# Patient Record
Sex: Male | Born: 1945
Health system: Southern US, Community
[De-identification: ages and names within clinical notes are randomized; demographics above are authoritative.]

## PROBLEM LIST (undated history)

## (undated) DIAGNOSIS — G4730 Sleep apnea, unspecified: Secondary | ICD-10-CM

## (undated) DIAGNOSIS — I10 Essential (primary) hypertension: Secondary | ICD-10-CM

## (undated) DIAGNOSIS — G5600 Carpal tunnel syndrome, unspecified upper limb: Secondary | ICD-10-CM

## (undated) DIAGNOSIS — J309 Allergic rhinitis, unspecified: Secondary | ICD-10-CM

## (undated) DIAGNOSIS — F1010 Alcohol abuse, uncomplicated: Secondary | ICD-10-CM

## (undated) DIAGNOSIS — G4700 Insomnia, unspecified: Secondary | ICD-10-CM

## (undated) DIAGNOSIS — J449 Chronic obstructive pulmonary disease, unspecified: Secondary | ICD-10-CM

## (undated) DIAGNOSIS — K219 Gastro-esophageal reflux disease without esophagitis: Secondary | ICD-10-CM

## (undated) DIAGNOSIS — F528 Other sexual dysfunction not due to a substance or known physiological condition: Secondary | ICD-10-CM

## (undated) DIAGNOSIS — R7302 Impaired glucose tolerance (oral): Secondary | ICD-10-CM

## (undated) DIAGNOSIS — F329 Major depressive disorder, single episode, unspecified: Secondary | ICD-10-CM

## (undated) DIAGNOSIS — F411 Generalized anxiety disorder: Secondary | ICD-10-CM

## (undated) DIAGNOSIS — E739 Lactose intolerance, unspecified: Secondary | ICD-10-CM

## (undated) DIAGNOSIS — E785 Hyperlipidemia, unspecified: Secondary | ICD-10-CM

## (undated) DIAGNOSIS — Z87898 Personal history of other specified conditions: Secondary | ICD-10-CM

## (undated) DIAGNOSIS — M79609 Pain in unspecified limb: Secondary | ICD-10-CM

## (undated) HISTORY — DX: Allergic rhinitis, unspecified: J30.9

## (undated) HISTORY — DX: Insomnia, unspecified: G47.00

## (undated) HISTORY — DX: Chronic obstructive pulmonary disease, unspecified: J44.9

## (undated) HISTORY — DX: Gastro-esophageal reflux disease without esophagitis: K21.9

## (undated) HISTORY — DX: Alcohol abuse, uncomplicated: F10.10

## (undated) HISTORY — DX: Other sexual dysfunction not due to a substance or known physiological condition: F52.8

## (undated) HISTORY — DX: Personal history of other specified conditions: Z87.898

## (undated) HISTORY — DX: Carpal tunnel syndrome, unspecified upper limb: G56.00

## (undated) HISTORY — PX: COLONOSCOPY: SHX174

## (undated) HISTORY — DX: Hyperlipidemia, unspecified: E78.5

## (undated) HISTORY — PX: ACHILLES TENDON REPAIR: SUR1153

## (undated) HISTORY — DX: Generalized anxiety disorder: F41.1

## (undated) HISTORY — DX: Impaired glucose tolerance (oral): R73.02

## (undated) HISTORY — DX: Lactose intolerance, unspecified: E73.9

## (undated) HISTORY — DX: Major depressive disorder, single episode, unspecified: F32.9

## (undated) HISTORY — DX: Essential (primary) hypertension: I10

## (undated) HISTORY — DX: Pain in unspecified limb: M79.609

---

## 1998-09-23 ENCOUNTER — Inpatient Hospital Stay (HOSPITAL_COMMUNITY): Admission: EM | Admit: 1998-09-23 | Discharge: 1998-09-29 | Payer: Self-pay | Admitting: Emergency Medicine

## 1998-09-23 ENCOUNTER — Encounter: Payer: Self-pay | Admitting: Emergency Medicine

## 2002-06-05 ENCOUNTER — Emergency Department (HOSPITAL_COMMUNITY): Admission: EM | Admit: 2002-06-05 | Discharge: 2002-06-05 | Payer: Self-pay | Admitting: Emergency Medicine

## 2002-06-05 ENCOUNTER — Encounter: Payer: Self-pay | Admitting: Emergency Medicine

## 2002-07-04 ENCOUNTER — Ambulatory Visit (HOSPITAL_COMMUNITY): Admission: RE | Admit: 2002-07-04 | Discharge: 2002-07-04 | Payer: Self-pay | Admitting: Gastroenterology

## 2002-07-04 LAB — HM COLONOSCOPY

## 2004-01-08 ENCOUNTER — Emergency Department (HOSPITAL_COMMUNITY): Admission: EM | Admit: 2004-01-08 | Discharge: 2004-01-08 | Payer: Self-pay | Admitting: Family Medicine

## 2004-02-17 ENCOUNTER — Emergency Department (HOSPITAL_COMMUNITY): Admission: EM | Admit: 2004-02-17 | Discharge: 2004-02-17 | Payer: Self-pay | Admitting: Emergency Medicine

## 2004-11-03 ENCOUNTER — Ambulatory Visit: Payer: Self-pay | Admitting: Internal Medicine

## 2005-04-14 ENCOUNTER — Ambulatory Visit: Payer: Self-pay | Admitting: Internal Medicine

## 2005-04-27 ENCOUNTER — Ambulatory Visit: Payer: Self-pay | Admitting: Internal Medicine

## 2005-04-30 ENCOUNTER — Ambulatory Visit: Payer: Self-pay | Admitting: Internal Medicine

## 2005-09-17 ENCOUNTER — Ambulatory Visit: Payer: Self-pay | Admitting: Internal Medicine

## 2006-08-16 ENCOUNTER — Encounter: Payer: Self-pay | Admitting: Internal Medicine

## 2006-08-16 ENCOUNTER — Ambulatory Visit: Payer: Self-pay | Admitting: Endocrinology

## 2006-08-16 LAB — CONVERTED CEMR LAB: PSA: 0.98 ng/mL

## 2006-08-19 ENCOUNTER — Ambulatory Visit: Payer: Self-pay | Admitting: Internal Medicine

## 2006-09-13 ENCOUNTER — Ambulatory Visit: Payer: Self-pay | Admitting: Internal Medicine

## 2007-06-06 ENCOUNTER — Ambulatory Visit: Payer: Self-pay | Admitting: Internal Medicine

## 2007-06-06 LAB — CONVERTED CEMR LAB
ALT: 46 units/L (ref 0–53)
AST: 40 units/L — ABNORMAL HIGH (ref 0–37)
Albumin: 3.9 g/dL (ref 3.5–5.2)
Alkaline Phosphatase: 50 units/L (ref 39–117)
Bilirubin, Direct: 0.1 mg/dL (ref 0.0–0.3)
Cholesterol: 182 mg/dL (ref 0–200)
Direct LDL: 110.1 mg/dL
HDL: 26.3 mg/dL — ABNORMAL LOW (ref >39.0)
Total Bilirubin: 0.8 mg/dL (ref 0.3–1.2)
Total CHOL/HDL Ratio: 6.9
Total Protein: 7 g/dL (ref 6.0–8.3)
Triglycerides: 308 mg/dL (ref 0–149)
VLDL: 62 mg/dL — ABNORMAL HIGH (ref 0–40)

## 2007-06-08 ENCOUNTER — Ambulatory Visit: Payer: Self-pay | Admitting: Internal Medicine

## 2007-06-22 ENCOUNTER — Encounter: Payer: Self-pay | Admitting: Internal Medicine

## 2007-08-02 ENCOUNTER — Ambulatory Visit: Payer: Self-pay | Admitting: Internal Medicine

## 2007-08-02 LAB — CONVERTED CEMR LAB
ALT: 56 units/L — ABNORMAL HIGH (ref 0–53)
AST: 44 units/L — ABNORMAL HIGH (ref 0–37)
Albumin: 4.1 g/dL (ref 3.5–5.2)
Alkaline Phosphatase: 58 units/L (ref 39–117)
BUN: 9 mg/dL (ref 6–23)
Bacteria, UA: NEGATIVE
Basophils Absolute: 0 10*3/uL (ref 0.0–0.1)
Basophils Relative: 0.5 % (ref 0.0–1.0)
Bilirubin Urine: NEGATIVE
Bilirubin, Direct: 0.1 mg/dL (ref 0.0–0.3)
CO2: 28 meq/L (ref 19–32)
Calcium: 9 mg/dL (ref 8.4–10.5)
Chloride: 104 meq/L (ref 96–112)
Cholesterol: 139 mg/dL (ref 0–200)
Creatinine, Ser: 1 mg/dL (ref 0.4–1.5)
Crystals: NEGATIVE
Direct LDL: 67 mg/dL
Eosinophils Absolute: 0.3 10*3/uL (ref 0.0–0.6)
Eosinophils Relative: 3.7 % (ref 0.0–5.0)
GFR calc Af Amer: 98 mL/min
GFR calc non Af Amer: 81 mL/min
Glucose, Bld: 122 mg/dL — ABNORMAL HIGH (ref 70–99)
HCT: 39.2 % (ref 39.0–52.0)
HDL: 32.5 mg/dL — ABNORMAL LOW (ref >39.0)
Hemoglobin: 13.9 g/dL (ref 13.0–17.0)
Ketones, ur: NEGATIVE mg/dL
Leukocytes, UA: NEGATIVE
Lymphocytes Relative: 31.3 % (ref 12.0–46.0)
MCHC: 35.4 g/dL (ref 30.0–36.0)
MCV: 95.8 fL (ref 78.0–100.0)
Monocytes Absolute: 1 10*3/uL — ABNORMAL HIGH (ref 0.2–0.7)
Monocytes Relative: 12.4 % — ABNORMAL HIGH (ref 3.0–11.0)
Mucus, UA: NEGATIVE
Neutro Abs: 4.1 10*3/uL (ref 1.4–7.7)
Neutrophils Relative %: 52.1 % (ref 43.0–77.0)
Nitrite: NEGATIVE
PSA: 0.78 ng/mL
PSA: 0.78 ng/mL (ref 0.10–4.00)
Platelets: 379 10*3/uL (ref 150–400)
Potassium: 4.2 meq/L (ref 3.5–5.1)
RBC / HPF: NONE SEEN
RBC: 4.1 M/uL — ABNORMAL LOW (ref 4.22–5.81)
RDW: 11.6 % (ref 11.5–14.6)
Sodium: 139 meq/L (ref 135–145)
Specific Gravity, Urine: 1.02 (ref 1.000–1.03)
TSH: 0.98 microintl units/mL (ref 0.35–5.50)
Total Bilirubin: 0.7 mg/dL (ref 0.3–1.2)
Total CHOL/HDL Ratio: 4.3
Total Protein, Urine: 30 mg/dL — AB
Total Protein: 7.2 g/dL (ref 6.0–8.3)
Triglycerides: 209 mg/dL (ref 0–149)
Urine Glucose: NEGATIVE mg/dL
Urobilinogen, UA: 0.2 (ref 0.0–1.0)
VLDL: 42 mg/dL — ABNORMAL HIGH (ref 0–40)
WBC, UA: NONE SEEN cells/hpf
WBC: 7.9 10*3/uL (ref 4.5–10.5)
pH: 6.5 (ref 5.0–8.0)

## 2007-08-04 ENCOUNTER — Ambulatory Visit: Payer: Self-pay | Admitting: Internal Medicine

## 2007-08-04 LAB — CONVERTED CEMR LAB: Hgb A1c MFr Bld: 5.8 % (ref 4.6–6.0)

## 2007-08-08 ENCOUNTER — Encounter: Payer: Self-pay | Admitting: Internal Medicine

## 2007-08-08 DIAGNOSIS — I10 Essential (primary) hypertension: Secondary | ICD-10-CM | POA: Insufficient documentation

## 2007-08-08 DIAGNOSIS — J309 Allergic rhinitis, unspecified: Secondary | ICD-10-CM

## 2007-08-08 DIAGNOSIS — F528 Other sexual dysfunction not due to a substance or known physiological condition: Secondary | ICD-10-CM

## 2007-08-08 DIAGNOSIS — Z87898 Personal history of other specified conditions: Secondary | ICD-10-CM | POA: Insufficient documentation

## 2007-08-08 DIAGNOSIS — F1010 Alcohol abuse, uncomplicated: Secondary | ICD-10-CM | POA: Insufficient documentation

## 2007-08-08 DIAGNOSIS — K219 Gastro-esophageal reflux disease without esophagitis: Secondary | ICD-10-CM | POA: Insufficient documentation

## 2007-08-08 DIAGNOSIS — F3289 Other specified depressive episodes: Secondary | ICD-10-CM

## 2007-08-08 DIAGNOSIS — F329 Major depressive disorder, single episode, unspecified: Secondary | ICD-10-CM

## 2007-08-08 DIAGNOSIS — E739 Lactose intolerance, unspecified: Secondary | ICD-10-CM

## 2007-08-08 DIAGNOSIS — E785 Hyperlipidemia, unspecified: Secondary | ICD-10-CM | POA: Insufficient documentation

## 2007-08-08 DIAGNOSIS — F411 Generalized anxiety disorder: Secondary | ICD-10-CM

## 2007-08-08 DIAGNOSIS — N529 Male erectile dysfunction, unspecified: Secondary | ICD-10-CM | POA: Insufficient documentation

## 2007-08-08 DIAGNOSIS — F32A Depression, unspecified: Secondary | ICD-10-CM | POA: Insufficient documentation

## 2007-08-08 HISTORY — DX: Essential (primary) hypertension: I10

## 2007-08-08 HISTORY — DX: Other sexual dysfunction not due to a substance or known physiological condition: F52.8

## 2007-08-08 HISTORY — DX: Hyperlipidemia, unspecified: E78.5

## 2007-08-08 HISTORY — DX: Allergic rhinitis, unspecified: J30.9

## 2007-08-08 HISTORY — DX: Gastro-esophageal reflux disease without esophagitis: K21.9

## 2007-08-08 HISTORY — DX: Personal history of other specified conditions: Z87.898

## 2007-08-08 HISTORY — DX: Lactose intolerance, unspecified: E73.9

## 2007-08-08 HISTORY — DX: Generalized anxiety disorder: F41.1

## 2007-08-08 HISTORY — DX: Other specified depressive episodes: F32.89

## 2007-08-08 HISTORY — DX: Alcohol abuse, uncomplicated: F10.10

## 2007-08-08 HISTORY — DX: Major depressive disorder, single episode, unspecified: F32.9

## 2007-12-12 ENCOUNTER — Emergency Department (HOSPITAL_COMMUNITY): Admission: EM | Admit: 2007-12-12 | Discharge: 2007-12-12 | Payer: Self-pay | Admitting: Emergency Medicine

## 2008-08-06 ENCOUNTER — Encounter: Payer: Self-pay | Admitting: Internal Medicine

## 2008-09-21 ENCOUNTER — Telehealth (INDEPENDENT_AMBULATORY_CARE_PROVIDER_SITE_OTHER): Payer: Self-pay | Admitting: *Deleted

## 2008-09-25 ENCOUNTER — Telehealth (INDEPENDENT_AMBULATORY_CARE_PROVIDER_SITE_OTHER): Payer: Self-pay | Admitting: *Deleted

## 2009-10-02 ENCOUNTER — Ambulatory Visit: Payer: Self-pay | Admitting: Internal Medicine

## 2009-10-02 DIAGNOSIS — M79609 Pain in unspecified limb: Secondary | ICD-10-CM | POA: Insufficient documentation

## 2009-10-02 HISTORY — DX: Pain in unspecified limb: M79.609

## 2009-10-03 DIAGNOSIS — J449 Chronic obstructive pulmonary disease, unspecified: Secondary | ICD-10-CM

## 2009-10-03 DIAGNOSIS — J4489 Other specified chronic obstructive pulmonary disease: Secondary | ICD-10-CM

## 2009-10-03 HISTORY — DX: Chronic obstructive pulmonary disease, unspecified: J44.9

## 2009-10-03 HISTORY — DX: Other specified chronic obstructive pulmonary disease: J44.89

## 2009-10-04 ENCOUNTER — Telehealth: Payer: Self-pay | Admitting: Internal Medicine

## 2009-10-04 LAB — CONVERTED CEMR LAB
ALT: 41 units/L (ref 0–53)
AST: 32 units/L (ref 0–37)
Albumin: 4.5 g/dL (ref 3.5–5.2)
Alkaline Phosphatase: 76 units/L (ref 39–117)
BUN: 22 mg/dL (ref 6–23)
Basophils Absolute: 0 10*3/uL (ref 0.0–0.1)
Basophils Relative: 0 % (ref 0.0–3.0)
Bilirubin Urine: NEGATIVE
Bilirubin, Direct: 0.1 mg/dL (ref 0.0–0.3)
CO2: 28 meq/L (ref 19–32)
Calcium: 9.6 mg/dL (ref 8.4–10.5)
Chloride: 102 meq/L (ref 96–112)
Cholesterol: 183 mg/dL (ref 0–200)
Creatinine, Ser: 1.2 mg/dL (ref 0.4–1.5)
Direct LDL: 86.7 mg/dL
Eosinophils Absolute: 0.5 10*3/uL (ref 0.0–0.7)
Eosinophils Relative: 4.9 % (ref 0.0–5.0)
Folate: 11.3 ng/mL
GFR calc non Af Amer: 78.48 mL/min (ref >60)
Glucose, Bld: 102 mg/dL — ABNORMAL HIGH (ref 70–99)
HCT: 42.6 % (ref 39.0–52.0)
HDL: 28.7 mg/dL — ABNORMAL LOW (ref >39.00)
Hemoglobin, Urine: NEGATIVE
Hemoglobin: 14.7 g/dL (ref 13.0–17.0)
Hgb A1c MFr Bld: 5.9 % (ref 4.6–6.5)
Ketones, ur: NEGATIVE mg/dL
Leukocytes, UA: NEGATIVE
Lymphocytes Relative: 24.6 % (ref 12.0–46.0)
Lymphs Abs: 2.3 10*3/uL (ref 0.7–4.0)
MCHC: 34.5 g/dL (ref 30.0–36.0)
MCV: 97 fL (ref 78.0–100.0)
Monocytes Absolute: 1.1 10*3/uL — ABNORMAL HIGH (ref 0.1–1.0)
Monocytes Relative: 11.5 % (ref 3.0–12.0)
Neutro Abs: 5.4 10*3/uL (ref 1.4–7.7)
Neutrophils Relative %: 59 % (ref 43.0–77.0)
Nitrite: NEGATIVE
PSA: 1.58 ng/mL (ref 0.10–4.00)
Platelets: 261 10*3/uL (ref 150.0–400.0)
Potassium: 4.3 meq/L (ref 3.5–5.1)
RBC: 4.39 M/uL (ref 4.22–5.81)
RDW: 11.6 % (ref 11.5–14.6)
Sodium: 138 meq/L (ref 135–145)
Specific Gravity, Urine: 1.015 (ref 1.000–1.030)
TSH: 0.69 microintl units/mL (ref 0.35–5.50)
Total Bilirubin: 0.8 mg/dL (ref 0.3–1.2)
Total CHOL/HDL Ratio: 6
Total Protein, Urine: NEGATIVE mg/dL
Total Protein: 7.8 g/dL (ref 6.0–8.3)
Triglycerides: 638 mg/dL — ABNORMAL HIGH (ref 0.0–149.0)
Urine Glucose: NEGATIVE mg/dL
Urobilinogen, UA: 0.2 (ref 0.0–1.0)
VLDL: 127.6 mg/dL — ABNORMAL HIGH (ref 0.0–40.0)
Vitamin B-12: 568 pg/mL (ref 211–911)
WBC: 9.3 10*3/uL (ref 4.5–10.5)
pH: 6 (ref 5.0–8.0)

## 2010-12-16 NOTE — Progress Notes (Signed)
Summary: lab requset  Phone Note Call from Patient Call back at Home Phone [phone]   Caller: [phone] Call For: dr Jonny Ruiz Summary of Call: per [name] call  will see dr Jonny Ruiz on 09-26-08 want to know if dr Jonny Ruiz want him to have blood work prior to his appt on Nov 11. Ryan Diaz is requseting Hemoblobin A1c .,and  HDL cholesterol test  Initial call taken by: Shelbie Proctor,  September 21, 2008 10:59 AM  Follow-up for Phone Call        good idea - ok for CPX labs prior - v70.0 Follow-up by: Corwin Levins MD,  September 21, 2008 12:54 PM  Additional Follow-up for Phone Call Additional follow up Details #1::        called pt to  inform left msg on machine  Additional Follow-up by: Shelbie Proctor,  September 21, 2008 1:13 PM

## 2010-12-16 NOTE — Assessment & Plan Note (Signed)
Summary: ARM GETS NUMB/NWS   Vital Signs:  Patient profile:   65 year old male Height:      70 inches Weight:      173 pounds BMI:     24.91 O2 Sat:      97 % on Room air Temp:     98.4 degrees F oral Pulse rate:   84 / minute BP sitting:   122 / 80  (left arm) Cuff size:   regular  Vitals Entered ByZella Ball Ewing (October 02, 2009 11:25 AM)  O2 Flow:  Room air  Preventive Care Screening  Colonoscopy:    Next Due:  07/2007     declines tetanus today and colonoscopy   History of Present Illness: here overall to f/u, also seen at the [place] but not seen for almost one yr per pt;  is on meds he cannot recall for BP and chol but seems to be tolerating well;  Pt denies CP, sob, doe, wheezing, orthopnea, pnd, worsening LE edema, palps, dizziness or syncope, except for typical daily reflux wore with recumbancy despite antireflux precaustions.  No dysphagia, wt loss, n/v or other abd pain or blood.  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  No elbow pain or extremity tenderness , erythema or swelling o/w, but does have some mild to mod occasional post neck pain as well and hard to find a good position to sleep at night - to him does not seem radicular from the neck, however to the arms.  No change in bowel or bladder, falls, or LE pain, numb or weakness, fever, night sweats, wt loss or trauma.    Preventive Screening-Counseling & Management  Alcohol-Tobacco     Smoking Status: quit  Problems Prior to Update: 1)  COPD  (ICD-496) 2)  Pain in Soft Tissues of Limb  (ICD-729.5) 3)  Preventive Health Care  (ICD-V70.0) 4)  Allergic Rhinitis  (ICD-477.9) 5)  Depression  (ICD-311) 6)  Anxiety   (ICD-300.00) 7)  Genital Herpes, Hx of  (ICD-V13.8) 8)  Erectile Dysfunction  (ICD-302.72) 9)  Abuse, Alcohol, Continuous  (ICD-305.01) 10)  Hypertension  (ICD-401.9) 11)  Hyperlipidemia  (ICD-272.4) 12)  Gerd  (ICD-530.81) 13)  Glucose Intolerance  (ICD-271.3)  Medications Prior to Update: 1)  None  Current Medications (verified): 1)  Bp Pill Per [facility] 2)  Chol Pill Per [facility] 3)  Meloxicam 15 Mg Tabs (Meloxicam) .Marland Kitchen.. 1po Once Daily As Needed Pain 4)  Omeprazole 20 Mg Cpdr (Omeprazole) .... 2 By Mouth Once Daily 5)  Fenofibrate 160 Mg Tabs (Fenofibrate) .Marland Kitchen.. 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 brother with colon polyp father died with brain tumor at 51 mother died at 69 with heart disease brother died with pna brother with CAD/MI  Social History: Reviewed history and no changes required. Former Smoker Alcohol use-yes self employed - Airline pilot work Divorced 1 son Smoking Status:  quit  Review of Systems  The patient denies anorexia, fever, weight loss, weight gain, vision loss, decreased hearing, hoarseness, chest pain, syncope, dyspnea on exertion, peripheral edema, prolonged cough, headaches, hemoptysis, abdominal pain,  melena, hematochezia, severe indigestion/heartburn, hematuria, incontinence, muscle weakness, suspicious skin lesions, transient blindness, difficulty walking, depression, unusual weight change, abnormal bleeding, enlarged lymph nodes, and angioedema.         all otherwise negative per pt -  Physical Exam  General:  alert and well-developed.   Head:  normocephalic and atraumatic.   Eyes:  vision grossly intact, pupils equal, and pupils round.   Ears:  R ear normal and L ear normal.   Nose:  no external deformity and no nasal discharge.   Mouth:  no gingival abnormalities and pharynx pink and moist.   Neck:  supple and no masses.   Lungs:  normal respiratory effort and normal breath sounds.   Heart:  normal rate and regular rhythm.   Abdomen:  soft, non-tender, and normal bowel sounds.   Msk:  no joint tenderness and no joint swelling.  and no specific tenderness or erythema or swelling of the neck, upper back shoulders,  or arms.   Extremities:  no edema, no erythema  Neurologic:  cranial nerves II-XII intact, strength normal in all extremities, and sensation intact to light touch.   Skin:  no rashes and no edema.   Psych:  moderately anxious.     Impression & Recommendations:  Problem # 1:  Preventive Health Care (ICD-V70.0)  Overall doing well, up to date, counseled on routine health concerns for screening and prevention, immunizations up to date or declined, labs ordered  Orders: TLB-BMP (Basic Metabolic Panel-BMET) (80048-METABOL) TLB-CBC Platelet - w/Differential (85025-CBCD) TLB-Hepatic/Liver Function Pnl (80076-HEPATIC) TLB-Lipid Panel (80061-LIPID) TLB-TSH (Thyroid Stimulating Hormone) (84443-TSH) TLB-PSA (Prostate Specific Antigen) (84153-PSA) TLB-Udip ONLY (81003-UDIP)  Problem # 2:  PAIN IN SOFT TISSUES OF LIMB (ICD-729.5)  bilateral I suspect is CTS but cant r/o bilat radicular pain from the c-spine; for now will tx with meloxicam as needed, wrist splint at night,  and check EMG/NCS; consider MRI c-spine  Orders: Misc. Referral (Misc. Ref) TLB-B12 + Folate Pnl (82746_82607-B12/FOL)  Problem # 3:  GERD (ICD-530.81)  His updated medication list for this problem includes:    Omeprazole 20 Mg Cpdr (Omeprazole) .Marland Kitchen... 2 by mouth once daily treat as above, f/u any worsening signs or symptoms   Complete Medication List: 1)  Bp Pill Per Va  2)  Chol Pill Per Va  3)  Meloxicam 15 Mg Tabs (Meloxicam) .Marland Kitchen.. 1po once daily as needed pain 4)  Omeprazole 20 Mg Cpdr (Omeprazole) .... 2 by mouth once daily 5)  Fenofibrate 160 Mg Tabs (Fenofibrate) .Marland Kitchen.. 1 by mouth once daily  Other Orders: TLB-A1C / Hgb A1C (Glycohemoglobin) (83036-A1C)  Patient Instructions: 1)  Please take all new medications as prescribed 2)  Continue all previous medications as before this visit  3)  please wear the wrist splints at night only to see if this helps the paiin 4)  Please go to the Lab in the basement for your blood and/or  urine tests today 5)  Continue all previous medications as before this visit  6)  You will be contacted about the referral(s) to: Nerve test for the arms 7)  Please schedule a follow-up appointment in 1 year. or sooner if needed Prescriptions: OMEPRAZOLE 20 MG CPDR (OMEPRAZOLE) 2 by mouth once daily  #60 x 11   Entered and Authorized by:   Corwin Levins MD   Signed by:   Corwin Levins MD on 10/02/2009   Method used:   Print then Give to Patient   RxID:   [id number] MELOXICAM 15 MG TABS (MELOXICAM) 1po once daily as needed pain  #30 x 5   Entered and Authorized by:   Corwin Levins MD   Signed by:   Corwin Levins MD on 10/02/2009   Method used:   Print then Give to Patient   RxID:   [id number]    Immunization History:  Influenza Immunization History:    Influenza:  historical (09/16/2009)    Immunization History:  Influenza Immunization History:    Influenza:  Historical (09/16/2009)

## 2010-12-16 NOTE — Progress Notes (Signed)
Summary: appt cx  Phone Note Call from Patient Call back at Home Phone [phone]   Summary of Call: per Ryan Diaz called want to cx his appt with Dr Ryan Diaz on 09-26-08 due to veteran day per pt transfer him to scheduling desk to cx  Initial call taken by: Shelbie Proctor,  September 25, 2008 12:22 PM

## 2010-12-16 NOTE — Consult Note (Signed)
Summary: [facility]   Imported By: Esmeralda Links D'jimraou 08/10/2008 15:32:34  _____________________________________________________________________  External Attachment:    Type:   Image     Comment:   External Document

## 2010-12-16 NOTE — Progress Notes (Signed)
----   Converted from flag ---- ---- 10/04/2009 8:19 AM, Corwin Levins MD wrote: yes  ---- 10/04/2009 8:00 AM, Zella Ball Ewing wrote: spoke to Ryan Diaz this am. Meds are as follows, Lisinopril 12.5 mg once daily, Pravastatin 80mg  1/2qhs, omeprazole 20mg  once daily. He also wants to know if he continues on these meds along with the fenofibrate also.   ---- 10/02/2009 5:22 PM, Corwin Levins MD wrote: please call pt to get accurate current meds (there were 2 meds he did not know today) ------------------------------  Appended Document:  please call pt  again, b/c lisinopril does not come in 12.5 -   is he taking lisinopril HCT ?  and what strength?  Appended Document:  Ryan Diaz's rx is Lisinopril HCTZ 10/12.5 mg

## 2011-09-26 ENCOUNTER — Encounter: Payer: Self-pay | Admitting: Internal Medicine

## 2011-09-26 DIAGNOSIS — IMO0001 Reserved for inherently not codable concepts without codable children: Secondary | ICD-10-CM | POA: Insufficient documentation

## 2011-09-26 DIAGNOSIS — Z0000 Encounter for general adult medical examination without abnormal findings: Secondary | ICD-10-CM | POA: Insufficient documentation

## 2011-09-26 DIAGNOSIS — Z0001 Encounter for general adult medical examination with abnormal findings: Secondary | ICD-10-CM | POA: Insufficient documentation

## 2011-09-26 DIAGNOSIS — R7302 Impaired glucose tolerance (oral): Secondary | ICD-10-CM

## 2011-09-26 HISTORY — DX: Impaired glucose tolerance (oral): R73.02

## 2011-09-29 ENCOUNTER — Encounter: Payer: Self-pay | Admitting: Internal Medicine

## 2011-11-18 ENCOUNTER — Ambulatory Visit (INDEPENDENT_AMBULATORY_CARE_PROVIDER_SITE_OTHER): Payer: Medicare Other | Admitting: Internal Medicine

## 2011-11-18 ENCOUNTER — Other Ambulatory Visit (INDEPENDENT_AMBULATORY_CARE_PROVIDER_SITE_OTHER): Payer: Medicare Other

## 2011-11-18 ENCOUNTER — Encounter: Payer: Self-pay | Admitting: Internal Medicine

## 2011-11-18 VITALS — BP 130/80 | HR 80 | Temp 98.6°F | Ht 71.0 in | Wt 172.2 lb

## 2011-11-18 DIAGNOSIS — E785 Hyperlipidemia, unspecified: Secondary | ICD-10-CM

## 2011-11-18 DIAGNOSIS — F528 Other sexual dysfunction not due to a substance or known physiological condition: Secondary | ICD-10-CM

## 2011-11-18 DIAGNOSIS — R5383 Other fatigue: Secondary | ICD-10-CM

## 2011-11-18 DIAGNOSIS — Z0000 Encounter for general adult medical examination without abnormal findings: Secondary | ICD-10-CM

## 2011-11-18 DIAGNOSIS — R7302 Impaired glucose tolerance (oral): Secondary | ICD-10-CM

## 2011-11-18 DIAGNOSIS — R5381 Other malaise: Secondary | ICD-10-CM

## 2011-11-18 DIAGNOSIS — R351 Nocturia: Secondary | ICD-10-CM

## 2011-11-18 DIAGNOSIS — R7309 Other abnormal glucose: Secondary | ICD-10-CM

## 2011-11-18 DIAGNOSIS — I10 Essential (primary) hypertension: Secondary | ICD-10-CM | POA: Insufficient documentation

## 2011-11-18 LAB — URINALYSIS, ROUTINE W REFLEX MICROSCOPIC
Bilirubin Urine: NEGATIVE
Hgb urine dipstick: NEGATIVE
Ketones, ur: NEGATIVE
Leukocytes, UA: NEGATIVE
Nitrite: NEGATIVE
Specific Gravity, Urine: 1.015 (ref 1.000–1.030)
Total Protein, Urine: NEGATIVE
Urine Glucose: NEGATIVE
Urobilinogen, UA: 0.2 (ref 0.0–1.0)
pH: 7 (ref 5.0–8.0)

## 2011-11-18 LAB — CBC WITH DIFFERENTIAL/PLATELET
Basophils Absolute: 0 10*3/uL (ref 0.0–0.1)
Basophils Relative: 0.6 % (ref 0.0–3.0)
Eosinophils Absolute: 0.3 10*3/uL (ref 0.0–0.7)
Eosinophils Relative: 4.6 % (ref 0.0–5.0)
HCT: 40.2 % (ref 39.0–52.0)
Hemoglobin: 13.7 g/dL (ref 13.0–17.0)
Lymphocytes Relative: 28.9 % (ref 12.0–46.0)
Lymphs Abs: 1.9 10*3/uL (ref 0.7–4.0)
MCHC: 34.2 g/dL (ref 30.0–36.0)
MCV: 96.2 fl (ref 78.0–100.0)
Monocytes Absolute: 0.8 10*3/uL (ref 0.1–1.0)
Monocytes Relative: 12.2 % — ABNORMAL HIGH (ref 3.0–12.0)
Neutro Abs: 3.5 10*3/uL (ref 1.4–7.7)
Neutrophils Relative %: 53.7 % (ref 43.0–77.0)
Platelets: 350 10*3/uL (ref 150.0–400.0)
RBC: 4.18 Mil/uL — ABNORMAL LOW (ref 4.22–5.81)
RDW: 13.1 % (ref 11.5–14.6)
WBC: 6.4 10*3/uL (ref 4.5–10.5)

## 2011-11-18 LAB — BASIC METABOLIC PANEL
BUN: 25 mg/dL — ABNORMAL HIGH (ref 6–23)
CO2: 29 mEq/L (ref 19–32)
Calcium: 9.2 mg/dL (ref 8.4–10.5)
Chloride: 106 mEq/L (ref 96–112)
Creatinine, Ser: 1.1 mg/dL (ref 0.4–1.5)
GFR: 83.56 mL/min (ref >60.00)
Glucose, Bld: 102 mg/dL — ABNORMAL HIGH (ref 70–99)
Potassium: 4.2 mEq/L (ref 3.5–5.1)
Sodium: 140 mEq/L (ref 135–145)

## 2011-11-18 LAB — HEPATIC FUNCTION PANEL
ALT: 20 U/L (ref 0–53)
AST: 19 U/L (ref 0–37)
Albumin: 4.1 g/dL (ref 3.5–5.2)
Alkaline Phosphatase: 54 U/L (ref 39–117)
Bilirubin, Direct: 0.1 mg/dL (ref 0.0–0.3)
Total Bilirubin: 0.6 mg/dL (ref 0.3–1.2)
Total Protein: 7.2 g/dL (ref 6.0–8.3)

## 2011-11-18 LAB — LIPID PANEL
Cholesterol: 162 mg/dL (ref 0–200)
HDL: 35.7 mg/dL — ABNORMAL LOW (ref >39.00)
LDL Cholesterol: 100 mg/dL — ABNORMAL HIGH (ref 0–99)
Total CHOL/HDL Ratio: 5
Triglycerides: 131 mg/dL (ref 0.0–149.0)
VLDL: 26.2 mg/dL (ref 0.0–40.0)

## 2011-11-18 LAB — TSH: TSH: 0.57 u[IU]/mL (ref 0.35–5.50)

## 2011-11-18 LAB — HEMOGLOBIN A1C: Hgb A1c MFr Bld: 5.7 % (ref 4.6–6.5)

## 2011-11-18 LAB — PSA: PSA: 1.19 ng/mL (ref 0.10–4.00)

## 2011-11-18 MED ORDER — OMEPRAZOLE 20 MG PO CPDR: 20.0000 mg | DELAYED_RELEASE_CAPSULE | Freq: Two times a day (BID) | ORAL | Status: DC

## 2011-11-18 MED ORDER — ASPIRIN EC 81 MG PO TBEC: 81.0000 mg | DELAYED_RELEASE_TABLET | Freq: Every day | ORAL | Status: AC

## 2011-11-18 MED ORDER — PRAVASTATIN SODIUM 80 MG PO TABS: 80.0000 mg | ORAL_TABLET | Freq: Every day | ORAL | Status: DC

## 2011-11-18 MED ORDER — LISINOPRIL-HYDROCHLOROTHIAZIDE 10-12.5 MG PO TABS: 1.0000 | ORAL_TABLET | Freq: Every day | ORAL | Status: DC

## 2011-11-18 NOTE — Patient Instructions (Addendum)
OK to stop the terasozin 2 mg Continue all other medications as before Please go to LAB in the Basement for the blood and/or urine tests to be done today Please call the phone number [phone] (the PhoneTree System) for results of testing in 2-3 days;  When calling, simply dial the number, and when prompted enter the MRN number above (the Medical Record Number) and the # key, then the message should start. You will be contacted regarding the referral for: colonoscopy Please start Aspirin 81 mg - 1 per day - COATED only to reduce risk of stroke and heart disease in the future Please return in 6 months, or sooner if needed

## 2011-11-18 NOTE — Assessment & Plan Note (Signed)
ECG reviewed as per emr, o/w stable overall by hx and exam, most recent data reviewed with pt, and pt to continue medical treatment as before

## 2011-11-18 NOTE — Assessment & Plan Note (Signed)
stable overall by hx and exam, most recent data reviewed with pt, and pt to continue medical treatment as before Lab Results  Component Value Date   HGBA1C 5.9 10/02/2009   For repaet a1c today

## 2011-11-18 NOTE — Assessment & Plan Note (Signed)
Not charged, but due for colonoscpy - will help arrange

## 2011-11-18 NOTE — Progress Notes (Signed)
Subjective:    Patient ID: Ryan Diaz, male    DOB: Mar 04, 1946, 66 y.o.   MRN: [id number]  HPI  Here for f/u;  Overall doing ok;  Pt denies CP, worsening SOB, DOE, wheezing, orthopnea, PND, worsening LE edema, palpitations, dizziness or syncope.  Pt denies neurological change such as new Headache, facial or extremity weakness.  Pt denies polydipsia, polyuria, or low sugar symptoms. Pt states overall good compliance with treatment and medications, good tolerability, and trying to follow lower cholesterol diet.  Pt denies worsening depressive symptoms, suicidal ideation or panic. No fever, wt loss, night sweats, loss of appetite, or other constitutional symptoms.  Pt states good ability with ADL's, low fall risk, home safety reviewed and adequate, no significant changes in hearing or vision, and occasionally active with exercise.  Does have some stiffness of the lower back in the AM.  Has some DOE with taking the trash cans to the curb but o/w doing ok. Was prescribed terasozin 2 mg qhs but has made no difference in  Nocturia 3-4 times at night, except that less ETOH has resulted in less nocturia., Does have sense of ongoing fatigue, but denies signficant hypersomnolence, may have more to do with getting less sleep at night with freq urination. Past Medical History  Diagnosis Date  . 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 ALLERGIC RHINITIS 08/08/2007  . ANXIETY 08/08/2007  . COPD 10/03/2009  . DEPRESSION 08/08/2007  . ERECTILE DYSFUNCTION 08/08/2007  . GENITAL HERPES, HX OF 08/08/2007  . GERD 08/08/2007  . GLUCOSE INTOLERANCE 08/08/2007  . HYPERLIPIDEMIA 08/08/2007  . HYPERTENSION 08/08/2007  . PAIN IN SOFT TISSUES OF LIMB 10/02/2009  . Impaired glucose tolerance 09/26/2011   No past surgical history on file.  reports that he has quit smoking. He does not have any smokeless tobacco history on file. He reports that he drinks alcohol. His drug history not on file. family history includes Colon polyps  in his brother. No Known Allergies No current outpatient prescriptions on file prior to visit.   Review of Systems Review of Systems  Constitutional: Negative for diaphoresis, activity change, appetite change and unexpected weight change.  HENT: Negative for hearing loss, ear pain, facial swelling, mouth sores and neck stiffness.   Eyes: Negative for pain, redness and visual disturbance.  Respiratory: Negative for shortness of breath and wheezing.   Cardiovascular: Negative for chest pain and palpitations.  Gastrointestinal: Negative for diarrhea, blood in stool, abdominal distention and rectal pain.  Genitourinary: Negative for hematuria, flank pain and decreased urine volume.  Musculoskeletal: Negative for myalgias and joint swelling.  Skin: Negative for color change and wound.  Neurological: Negative for syncope and numbness.  Hematological: Negative for adenopathy.  Psychiatric/Behavioral: Negative for hallucinations, self-injury, decreased concentration and agitation.      Objective:   Physical Exam BP 130/80  Pulse 80  Temp(Src) 98.6 F (37 C) (Oral)  Ht 5\' 11"  (1.803 m)  Wt 172 lb 4 oz (78.132 kg)  BMI 24.02 kg/m2  SpO2 95% Physical Exam  VS noted Constitutional: Pt is oriented to person, place, and time. Appears well-developed and well-nourished.  HENT:  Head: Normocephalic and atraumatic.  Right Ear: External ear normal.  Left Ear: External ear normal.  Nose: Nose normal.  Mouth/Throat: Oropharynx is clear and moist.  Eyes: Conjunctivae and EOM are normal. Pupils are equal, round, and reactive to light.  Neck: Normal range of motion. Neck supple. No JVD present. No tracheal deviation present.  Cardiovascular: Normal rate, regular  rhythm, normal heart sounds and intact distal pulses.   Pulmonary/Chest: Effort normal and breath sounds normal.  Abdominal: Soft. Bowel sounds are normal. There is no tenderness.  Musculoskeletal: Normal range of motion. Exhibits no  edema.  Lymphadenopathy:  Has no cervical adenopathy.  Neurological: Pt is alert and oriented to person, place, and time. Pt has normal reflexes. No cranial nerve deficit.  Skin: Skin is warm and dry. No rash noted.  Psychiatric:  Has  normal mood and affect. Behavior is normal.     Assessment & Plan:

## 2011-11-18 NOTE — Assessment & Plan Note (Signed)
Mild, improved with less ETOH use, for UA and PSA as he is due

## 2011-11-18 NOTE — Assessment & Plan Note (Signed)
Ongoing with low libido as well, declines viagra or similar, but asks for testosterone check - will order

## 2011-11-18 NOTE — Assessment & Plan Note (Signed)
Improved, none for the last 2 mo, except for 5 beers on NYE;  to f/u any worsening symptoms or concerns

## 2011-11-19 LAB — TESTOSTERONE, FREE, TOTAL, SHBG
Sex Hormone Binding: 42 nmol/L (ref 13–71)
Testosterone, Free: 66.6 pg/mL (ref 47.0–244.0)
Testosterone-% Free: 1.7 % (ref 1.6–2.9)
Testosterone: 383.65 ng/dL (ref 250–890)

## 2011-11-22 ENCOUNTER — Encounter: Payer: Self-pay | Admitting: Internal Medicine

## 2011-11-22 NOTE — Assessment & Plan Note (Signed)
Etiology unclear, Exam otherwise benign, to check labs as documented, follow with expectant management  

## 2011-11-22 NOTE — Assessment & Plan Note (Signed)
stable overall by hx and exam, most recent data reviewed with pt, and pt to continue medical treatment as before Lab Results  Component Value Date   LDLCALC 100* 11/18/2011

## 2011-11-22 NOTE — Assessment & Plan Note (Signed)
stable overall by hx and exam, most recent data reviewed with pt, and pt to continue medical treatment as before  BP Readings from Last 3 Encounters:  11/18/11 130/80  10/02/09 122/80  07/25/07 159/97

## 2011-12-09 ENCOUNTER — Encounter: Payer: Self-pay | Admitting: Gastroenterology

## 2011-12-15 ENCOUNTER — Ambulatory Visit (AMBULATORY_SURGERY_CENTER): Payer: Medicare Other

## 2011-12-15 VITALS — Ht 71.0 in | Wt 172.8 lb

## 2011-12-15 DIAGNOSIS — Z8371 Family history of colonic polyps: Secondary | ICD-10-CM

## 2011-12-15 DIAGNOSIS — Z1211 Encounter for screening for malignant neoplasm of colon: Secondary | ICD-10-CM

## 2011-12-15 MED ORDER — PEG-KCL-NACL-NASULF-NA ASC-C 100 G PO SOLR: 1.0000 | Freq: Once | ORAL | Status: AC

## 2011-12-18 HISTORY — PX: COLONOSCOPY: SHX174

## 2011-12-23 ENCOUNTER — Encounter: Payer: Self-pay | Admitting: Gastroenterology

## 2011-12-23 ENCOUNTER — Ambulatory Visit (AMBULATORY_SURGERY_CENTER): Payer: Medicare Other | Admitting: Gastroenterology

## 2011-12-23 DIAGNOSIS — Z8371 Family history of colonic polyps: Secondary | ICD-10-CM

## 2011-12-23 DIAGNOSIS — Z1211 Encounter for screening for malignant neoplasm of colon: Secondary | ICD-10-CM

## 2011-12-23 DIAGNOSIS — K5730 Diverticulosis of large intestine without perforation or abscess without bleeding: Secondary | ICD-10-CM

## 2011-12-23 MED ORDER — SODIUM CHLORIDE 0.9 % IV SOLN: 500.0000 mL | INTRAVENOUS | Status: DC

## 2011-12-23 NOTE — Progress Notes (Signed)
No complaints noted in the recovery room. Maw  Pt passed a good amount of flatus in the recovery room. Maw  Patient did not experience any of the following events: a burn prior to discharge; a fall within the facility; wrong site/side/patient/procedure/implant event; or a hospital transfer or hospital admission upon discharge from the facility. [phone]) Patient did not have preoperative order for IV antibiotic SSI prophylaxis. [phone])

## 2011-12-23 NOTE — Op Note (Signed)
[facility] [address] [place], [place]  [address]  COLONOSCOPY PROCEDURE REPORT  PATIENT:  Ryan Diaz, Ryan Diaz  MR#:  [id number] BIRTHDATE:  [birth year], 65 yrs. old  GENDER:  male ENDOSCOPIST:  Barbette Hair. Arlyce Dice, MD REF. BY:  Oliver Barre, M.D. PROCEDURE DATE:  12/23/2011 PROCEDURE:  Diagnostic Colonoscopy ASA CLASS:  Class II INDICATIONS:  Routine Risk Screening MEDICATIONS:   MAC sedation, administered by CRNA propofol 200mg IV  DESCRIPTION OF PROCEDURE:   After the risks benefits and alternatives of the procedure were thoroughly explained, informed consent was obtained.  Digital rectal exam was performed and revealed no abnormalities.   The LB 180AL [id number] endoscope was introduced through the anus and advanced to the cecum, which was identified by both the appendix and ileocecal valve, without limitations.  The quality of the prep was excellent, using MoviPrep.  The instrument was then slowly withdrawn as the colon was fully examined. <<PROCEDUREIMAGES>>  FINDINGS:  Mild diverticulosis was found in the sigmoid colon (see image5).  Internal Hemorrhoids were found (see image6).  This was otherwise a normal examination of the colon (see image1 and image2).   Retroflexed views in the rectum revealed no abnormalities.    The time to cecum =  1) 4.25  minutes. The scope was then withdrawn in  1) 7.0  minutes from the cecum and the procedure completed. COMPLICATIONS:  None ENDOSCOPIC IMPRESSION: 1) Mild diverticulosis in the sigmoid colon 2) Internal hemorrhoids 3) Otherwise normal examination RECOMMENDATIONS: 1) Continue current colorectal screening recommendations for "routine risk" patients with a repeat colonoscopy in 10 years. REPEAT EXAM:  In 10 year(s) for Colonoscopy.  ______________________________ Barbette Hair. Arlyce Dice, MD  CC:  n. eSIGNED:   Barbette Hair. Deral Schellenberg at 12/23/2011 10:36 AM  [name], [phone]

## 2011-12-23 NOTE — Patient Instructions (Signed)
See the picture page for your findings from your exam today.  Follow the green and blue discharge instruction sheets the rest of the day.  Resume your prior medications today. Please call if any questions or concerns.  

## 2011-12-24 ENCOUNTER — Telehealth: Payer: Self-pay | Admitting: *Deleted

## 2011-12-24 NOTE — Telephone Encounter (Signed)
  Follow up Call-  Call back number 12/23/2011  Post procedure Call Back phone  # [phone]  Permission to leave phone message Yes     Patient questions:  Do you have a fever, pain , or abdominal swelling? no Pain Score  0 *  Have you tolerated food without any problems? yes  Have you been able to return to your normal activities? yes  Do you have any questions about your discharge instructions: Diet   no Medications  no Follow up visit  no  Do you have questions or concerns about your Care? no  Actions: * If pain score is 4 or above: No action needed, pain <4.

## 2012-05-18 ENCOUNTER — Ambulatory Visit (INDEPENDENT_AMBULATORY_CARE_PROVIDER_SITE_OTHER): Payer: Medicare Other | Admitting: Internal Medicine

## 2012-05-18 ENCOUNTER — Encounter: Payer: Self-pay | Admitting: Internal Medicine

## 2012-05-18 VITALS — BP 110/70 | HR 82 | Temp 99.0°F | Ht 71.0 in | Wt 173.4 lb

## 2012-05-18 DIAGNOSIS — E785 Hyperlipidemia, unspecified: Secondary | ICD-10-CM

## 2012-05-18 DIAGNOSIS — R7309 Other abnormal glucose: Secondary | ICD-10-CM

## 2012-05-18 DIAGNOSIS — I10 Essential (primary) hypertension: Secondary | ICD-10-CM

## 2012-05-18 DIAGNOSIS — R7302 Impaired glucose tolerance (oral): Secondary | ICD-10-CM

## 2012-05-18 DIAGNOSIS — Z0000 Encounter for general adult medical examination without abnormal findings: Secondary | ICD-10-CM

## 2012-05-18 DIAGNOSIS — G4700 Insomnia, unspecified: Secondary | ICD-10-CM

## 2012-05-18 DIAGNOSIS — H9190 Unspecified hearing loss, unspecified ear: Secondary | ICD-10-CM

## 2012-05-18 DIAGNOSIS — H9192 Unspecified hearing loss, left ear: Secondary | ICD-10-CM

## 2012-05-18 DIAGNOSIS — F1010 Alcohol abuse, uncomplicated: Secondary | ICD-10-CM

## 2012-05-18 HISTORY — DX: Insomnia, unspecified: G47.00

## 2012-05-18 MED ORDER — ZOLPIDEM TARTRATE 10 MG PO TABS: 10.0000 mg | ORAL_TABLET | Freq: Every evening | ORAL | Status: DC | PRN

## 2012-05-18 NOTE — Patient Instructions (Addendum)
Your left ear was irrigated of wax today Take all new medications as prescribed  - the generic for ambien for sleep as needed Please continue to try to cut back or stop the alcohol use Continue all other medications as before Please have the pharmacy call with any refills you may need. Please continue your efforts at being more active, low cholesterol diet, and weight control. Please continue to monitor your blood pressure on a regular basis;  Your goal is to be less than 140/90 Please return in 6 mo with Lab testing done 3-5 days before

## 2012-05-19 ENCOUNTER — Encounter: Payer: Self-pay | Admitting: Internal Medicine

## 2012-05-19 NOTE — Progress Notes (Signed)
Subjective:    Patient ID: Ryan Diaz, male    DOB: 11/24/1945, 66 y.o.   MRN: [id number]  HPI  Here to f/u - Pt denies chest pain, increased sob or doe, wheezing, orthopnea, PND, increased LE swelling, palpitations, dizziness or syncope.  Has been trying to follow lower fat/chol diet with hx of elev TG.  Still drinks beer 3-5 x 16 oz many days.  Denies worsening depressive symptoms, suicidal ideation, or panic, though has had increased insomnia for several months nightly with difficulty getting to sleep.   Pt denies fever, wt loss, night sweats, loss of appetite, or other constitutional symptoms  Pt denies new neurological symptoms such as new headache, or facial or extremity weakness or numbness   Pt denies polydipsia, polyuria. Incidentally with left hearing loss in last several days - ? Wax Past Medical History  Diagnosis Date  . 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 ALLERGIC RHINITIS 08/08/2007  . ANXIETY 08/08/2007  . COPD 10/03/2009  . DEPRESSION 08/08/2007  . ERECTILE DYSFUNCTION 08/08/2007  . GENITAL HERPES, HX OF 08/08/2007  . GERD 08/08/2007  . GLUCOSE INTOLERANCE 08/08/2007  . HYPERLIPIDEMIA 08/08/2007  . HYPERTENSION 08/08/2007  . PAIN IN SOFT TISSUES OF LIMB 10/02/2009  . Impaired glucose tolerance 09/26/2011  . Insomnia 05/18/2012   No past surgical history on file.  reports that he quit smoking about 2 years ago. His smoking use included Cigarettes. He has never used smokeless tobacco. He reports that he drinks about 9 ounces of alcohol per week. He reports that he does not use illicit drugs. family history includes Colon polyps in his brother. No Known Allergies Current Outpatient Prescriptions on File Prior to Visit  Medication Sig Dispense Refill  . aspirin EC 81 MG tablet Take 1 tablet (81 mg total) by mouth daily.  100 tablet  10  . lisinopril-hydrochlorothiazide (PRINZIDE,ZESTORETIC) 10-12.5 MG per tablet Take 1 tablet by mouth daily.  903 tablet  3  . omeprazole  (PRILOSEC) 20 MG capsule Take 1 capsule (20 mg total) by mouth 2 (two) times daily.  90 capsule  3  . pravastatin (PRAVACHOL) 80 MG tablet Take 1 tablet (80 mg total) by mouth daily.  90 tablet  3  . terazosin (HYTRIN) 2 MG capsule Take 2 mg by mouth at bedtime.      Marland Kitchen zolpidem (AMBIEN) 10 MG tablet Take 1 tablet (10 mg total) by mouth at bedtime as needed for sleep.  30 tablet  5   Review of Systems Review of Systems  Constitutional: Negative for diaphoresis and unexpected weight change.  HENT: Negative for  tinnitus.   Eyes: Negative for photophobia and visual disturbance.  Respiratory: Negative for sob, CP  Gastrointestinal: Negative for vomiting and blood in stool.  Genitourinary: Negative for hematuria and decreased urine volume.  Musculoskeletal: Negative for gait problem.  Skin: Negative for color change and wound.  Neurological: Negative for tremors and numbness.  Psychiatric/Behavioral: Negative for decreased concentration. The patient is not hyperactive.      Objective:   Physical Exam BP 110/70  Pulse 82  Temp 99 F (37.2 C) (Oral)  Ht 5\' 11"  (1.803 m)  Wt 173 lb 6 oz (78.642 kg)  BMI 24.18 kg/m2  SpO2 96% Physical Exam  VS noted Constitutional: Pt appears well-developed and well-nourished.  HENT: Head: Normocephalic.  Right Ear: External ear normal.  Left Ear: External ear normal.  Left TM clear after wax impaction removed Eyes: Conjunctivae and EOM are normal. Pupils are equal,  round, and reactive to light.  Neck: Normal range of motion. Neck supple.  Cardiovascular: Normal rate and regular rhythm.   Pulmonary/Chest: Effort normal and breath sounds normal.  Neurological: Pt is alert. Motor/sens/dtr inctat Skin: Skin is warm. No erythema.  Psychiatric: Pt behavior is normal. Thought content normal.     Assessment & Plan:

## 2012-05-19 NOTE — Assessment & Plan Note (Signed)
Ok for ambien prn,.  to f/u any worsening symptoms or concerns  

## 2012-05-19 NOTE — Assessment & Plan Note (Signed)
stable overall by hx and exam, most recent data reviewed with pt, and pt to continue medical treatment as before Lab Results  Component Value Date   HGBA1C 5.7 11/18/2011

## 2012-05-19 NOTE — Assessment & Plan Note (Signed)
Urged abstinence or at least cutting down overall consumption

## 2012-05-19 NOTE — Assessment & Plan Note (Signed)
Improved after wax irrigation

## 2012-05-19 NOTE — Assessment & Plan Note (Signed)
Lab Results  Component Value Date   CHOL 162 11/18/2011   HDL 35.70* 11/18/2011   LDLCALC 100* 11/18/2011   LDLDIRECT 86.7 10/02/2009   TRIG 131.0 11/18/2011   CHOLHDL 5 11/18/2011   Urged cont lower fat diet

## 2012-05-19 NOTE — Assessment & Plan Note (Signed)
stable overall by hx and exam, most recent data reviewed with pt, and pt to continue medical treatment as before BP Readings from Last 3 Encounters:  05/18/12 110/70  12/23/11 145/85  11/18/11 130/80

## 2012-11-23 ENCOUNTER — Ambulatory Visit: Payer: Medicare Other | Admitting: Internal Medicine

## 2012-12-07 ENCOUNTER — Other Ambulatory Visit: Payer: Self-pay | Admitting: Internal Medicine

## 2012-12-07 NOTE — Telephone Encounter (Signed)
Done hardcopy to [facility]  

## 2012-12-08 NOTE — Telephone Encounter (Signed)
Faxed hardcopy to pharmacy. 

## 2012-12-29 ENCOUNTER — Encounter: Payer: Medicare Other | Admitting: Internal Medicine

## 2013-01-06 ENCOUNTER — Ambulatory Visit (INDEPENDENT_AMBULATORY_CARE_PROVIDER_SITE_OTHER): Payer: Medicare Other | Admitting: Internal Medicine

## 2013-01-06 ENCOUNTER — Ambulatory Visit (INDEPENDENT_AMBULATORY_CARE_PROVIDER_SITE_OTHER): Payer: Medicare Other

## 2013-01-06 ENCOUNTER — Encounter: Payer: Self-pay | Admitting: Internal Medicine

## 2013-01-06 VITALS — BP 140/70 | HR 112 | Temp 98.0°F | Ht 71.0 in | Wt 173.2 lb

## 2013-01-06 DIAGNOSIS — I10 Essential (primary) hypertension: Secondary | ICD-10-CM

## 2013-01-06 DIAGNOSIS — R7302 Impaired glucose tolerance (oral): Secondary | ICD-10-CM

## 2013-01-06 DIAGNOSIS — Z0000 Encounter for general adult medical examination without abnormal findings: Secondary | ICD-10-CM

## 2013-01-06 DIAGNOSIS — R7309 Other abnormal glucose: Secondary | ICD-10-CM

## 2013-01-06 LAB — URINALYSIS, ROUTINE W REFLEX MICROSCOPIC
Bilirubin Urine: NEGATIVE
Hgb urine dipstick: NEGATIVE
Ketones, ur: NEGATIVE
Leukocytes, UA: NEGATIVE
Nitrite: NEGATIVE
Specific Gravity, Urine: 1.01 (ref 1.000–1.030)
Total Protein, Urine: NEGATIVE
Urine Glucose: NEGATIVE
Urobilinogen, UA: 0.2 (ref 0.0–1.0)
pH: 6 (ref 5.0–8.0)

## 2013-01-06 LAB — CBC WITH DIFFERENTIAL/PLATELET
Basophils Absolute: 0 10*3/uL (ref 0.0–0.1)
Basophils Relative: 0.5 % (ref 0.0–3.0)
Eosinophils Absolute: 0.3 10*3/uL (ref 0.0–0.7)
Eosinophils Relative: 4.9 % (ref 0.0–5.0)
HCT: 42.8 % (ref 39.0–52.0)
Hemoglobin: 14.4 g/dL (ref 13.0–17.0)
Lymphocytes Relative: 31.9 % (ref 12.0–46.0)
Lymphs Abs: 2 10*3/uL (ref 0.7–4.0)
MCHC: 33.7 g/dL (ref 30.0–36.0)
MCV: 94.7 fl (ref 78.0–100.0)
Monocytes Absolute: 0.8 10*3/uL (ref 0.1–1.0)
Monocytes Relative: 12.5 % — ABNORMAL HIGH (ref 3.0–12.0)
Neutro Abs: 3.1 10*3/uL (ref 1.4–7.7)
Neutrophils Relative %: 50.2 % (ref 43.0–77.0)
Platelets: 363 10*3/uL (ref 150.0–400.0)
RBC: 4.52 Mil/uL (ref 4.22–5.81)
RDW: 13 % (ref 11.5–14.6)
WBC: 6.1 10*3/uL (ref 4.5–10.5)

## 2013-01-06 LAB — HEPATIC FUNCTION PANEL
ALT: 24 U/L (ref 0–53)
AST: 21 U/L (ref 0–37)
Albumin: 4.2 g/dL (ref 3.5–5.2)
Alkaline Phosphatase: 68 U/L (ref 39–117)
Bilirubin, Direct: 0.1 mg/dL (ref 0.0–0.3)
Total Bilirubin: 0.7 mg/dL (ref 0.3–1.2)
Total Protein: 7.9 g/dL (ref 6.0–8.3)

## 2013-01-06 LAB — LIPID PANEL
Cholesterol: 176 mg/dL (ref 0–200)
HDL: 38.2 mg/dL — ABNORMAL LOW (ref >39.00)
Total CHOL/HDL Ratio: 5
Triglycerides: 316 mg/dL — ABNORMAL HIGH (ref 0.0–149.0)
VLDL: 63.2 mg/dL — ABNORMAL HIGH (ref 0.0–40.0)

## 2013-01-06 LAB — BASIC METABOLIC PANEL
BUN: 17 mg/dL (ref 6–23)
CO2: 28 mEq/L (ref 19–32)
Calcium: 9.3 mg/dL (ref 8.4–10.5)
Chloride: 99 mEq/L (ref 96–112)
Creatinine, Ser: 1.3 mg/dL (ref 0.4–1.5)
GFR: 68.4 mL/min (ref >60.00)
Glucose, Bld: 81 mg/dL (ref 70–99)
Potassium: 3.9 mEq/L (ref 3.5–5.1)
Sodium: 134 mEq/L — ABNORMAL LOW (ref 135–145)

## 2013-01-06 LAB — PSA: PSA: 1.4 ng/mL (ref 0.10–4.00)

## 2013-01-06 LAB — HEMOGLOBIN A1C: Hgb A1c MFr Bld: 5.7 % (ref 4.6–6.5)

## 2013-01-06 LAB — TSH: TSH: 0.41 u[IU]/mL (ref 0.35–5.50)

## 2013-01-06 NOTE — Progress Notes (Signed)
Subjective:    Patient ID: Ryan Diaz, male    DOB: Jan 20, 1946, 67 y.o.   MRN: [id number]  HPI  Here for wellness and f/u;  Overall doing ok;  Pt denies CP, worsening SOB, DOE, wheezing, orthopnea, PND, worsening LE edema, palpitations, dizziness or syncope.  Pt denies neurological change such as new headache, facial or extremity weakness.  Pt denies polydipsia, polyuria, or low sugar symptoms. Pt states overall good compliance with treatment and medications, good tolerability, and has been trying to follow lower cholesterol diet.  Pt denies worsening depressive symptoms, suicidal ideation or panic. No fever, night sweats, wt loss, loss of appetite, or other constitutional symptoms.  Pt states good ability with ADL's, has low fall risk, home safety reviewed and adequate, no other significant changes in hearing or vision, and only occasionally active with exercise.  No new complaints. Still working partime doing body work on cars.  States drinking less. Past Medical History  Diagnosis Date  . 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 ALLERGIC RHINITIS 08/08/2007  . ANXIETY 08/08/2007  . COPD 10/03/2009  . DEPRESSION 08/08/2007  . ERECTILE DYSFUNCTION 08/08/2007  . GENITAL HERPES, HX OF 08/08/2007  . GERD 08/08/2007  . GLUCOSE INTOLERANCE 08/08/2007  . HYPERLIPIDEMIA 08/08/2007  . HYPERTENSION 08/08/2007  . PAIN IN SOFT TISSUES OF LIMB 10/02/2009  . Impaired glucose tolerance 09/26/2011  . Insomnia 05/18/2012   No past surgical history on file.  reports that he quit smoking about 3 years ago. His smoking use included Cigarettes. He smoked 0.00 packs per day. He has never used smokeless tobacco. He reports that he drinks about 9.0 ounces of alcohol per week. He reports that he does not use illicit drugs. family history includes Colon polyps in his brother. No Known Allergies Current Outpatient Prescriptions on File Prior to Visit  Medication Sig Dispense Refill  . lisinopril-hydrochlorothiazide  (PRINZIDE,ZESTORETIC) 10-12.5 MG per tablet Take 1 tablet by mouth daily.  903 tablet  3  . omeprazole (PRILOSEC) 20 MG capsule Take 1 capsule (20 mg total) by mouth 2 (two) times daily.  90 capsule  3  . pravastatin (PRAVACHOL) 80 MG tablet Take 1 tablet (80 mg total) by mouth daily.  90 tablet  3  . terazosin (HYTRIN) 2 MG capsule Take 2 mg by mouth at bedtime.      Marland Kitchen zolpidem (AMBIEN) 10 MG tablet TAKE ONE TABLET BY MOUTH AT BEDTIME AS NEEDED FOR SLEEP  30 tablet  4   No current facility-administered medications on file prior to visit.   Review of Systems Constitutional: Negative for diaphoresis, activity change, appetite change or unexpected weight change.  HENT: Negative for hearing loss, ear pain, facial swelling, mouth sores and neck stiffness.   Eyes: Negative for pain, redness and visual disturbance.  Respiratory: Negative for shortness of breath and wheezing.   Cardiovascular: Negative for chest pain and palpitations.  Gastrointestinal: Negative for diarrhea, blood in stool, abdominal distention or other pain Genitourinary: Negative for hematuria, flank pain or change in urine volume.  Musculoskeletal: Negative for myalgias and joint swelling.  Skin: Negative for color change and wound.  Neurological: Negative for syncope and numbness. other than noted Hematological: Negative for adenopathy.  Psychiatric/Behavioral: Negative for hallucinations, self-injury, decreased concentration and agitation.      Objective:   Physical Exam BP 140/70  Pulse 112  Temp(Src) 98 F (36.7 C) (Oral)  Ht 5\' 11"  (1.803 m)  Wt 173 lb 4 oz (78.586 kg)  BMI 24.17 kg/m2  SpO2 95% VS noted,  Constitutional: Pt is oriented to person, place, and time. Appears well-developed and well-nourished.  Head: Normocephalic and atraumatic.  Right Ear: External ear normal.  Left Ear: External ear normal.  Nose: Nose normal.  Mouth/Throat: Oropharynx is clear and moist.  Eyes: Conjunctivae and EOM are  normal. Pupils are equal, round, and reactive to light.  Neck: Normal range of motion. Neck supple. No JVD present. No tracheal deviation present.  Cardiovascular: Normal rate, regular rhythm, normal heart sounds and intact distal pulses.   Pulmonary/Chest: Effort normal and breath sounds normal.  Abdominal: Soft. Bowel sounds are normal. There is no tenderness. No HSM  Musculoskeletal: Normal range of motion. Exhibits no edema.  Lymphadenopathy:  Has no cervical adenopathy.  Neurological: Pt is alert and oriented to person, place, and time. Pt has normal reflexes. No cranial nerve deficit.  Skin: Skin is warm and dry. No rash noted.  Psychiatric:  Has  normal mood and affect. Behavior is normal.     Assessment & Plan:

## 2013-01-06 NOTE — Patient Instructions (Addendum)
Please continue all other medications as before Please go to the LAB in the Basement (turn left off the elevator) for the tests to be done today You will be contacted by phone if any changes need to be made immediately.  Otherwise, you will receive a letter about your results with an explanation Please remember to sign up for My Chart if you have not done so, as this will be important to you in the future with finding out test results, communicating by private email, and scheduling acute appointments online when needed. You are otherwise up to date with prevention measures today. Please continue your efforts at being more active, low cholesterol diet, and weight control. Please keep your appointments with your specialists as you have planned  - at the [place] Please return in 1 year for your yearly visit, or sooner if needed

## 2013-01-06 NOTE — Assessment & Plan Note (Signed)

## 2013-01-07 NOTE — Assessment & Plan Note (Signed)
stable overall by history and exam, recent data reviewed with pt, and pt to continue medical treatment as before,  to f/u any worsening symptoms or concerns Lab Results  Component Value Date   HGBA1C 5.7 11/18/2011

## 2013-01-07 NOTE — Assessment & Plan Note (Signed)
stable overall by history and exam, recent data reviewed with pt, and pt to continue medical treatment as before,  to f/u any worsening symptoms or concerns BP Readings from Last 3 Encounters:  01/06/13 140/70  05/18/12 110/70  12/23/11 145/85

## 2013-01-09 ENCOUNTER — Encounter: Payer: Self-pay | Admitting: Internal Medicine

## 2013-01-09 LAB — LDL CHOLESTEROL, DIRECT: Direct LDL: 96.7 mg/dL

## 2013-07-06 ENCOUNTER — Encounter: Payer: Self-pay | Admitting: Internal Medicine

## 2013-07-06 ENCOUNTER — Ambulatory Visit (INDEPENDENT_AMBULATORY_CARE_PROVIDER_SITE_OTHER): Payer: Medicare Other | Admitting: Internal Medicine

## 2013-07-06 VITALS — BP 94/64 | HR 101 | Temp 98.3°F | Ht 71.0 in | Wt 170.1 lb

## 2013-07-06 DIAGNOSIS — I10 Essential (primary) hypertension: Secondary | ICD-10-CM

## 2013-07-06 DIAGNOSIS — E785 Hyperlipidemia, unspecified: Secondary | ICD-10-CM

## 2013-07-06 DIAGNOSIS — G5601 Carpal tunnel syndrome, right upper limb: Secondary | ICD-10-CM

## 2013-07-06 DIAGNOSIS — R7309 Other abnormal glucose: Secondary | ICD-10-CM

## 2013-07-06 DIAGNOSIS — Z0000 Encounter for general adult medical examination without abnormal findings: Secondary | ICD-10-CM

## 2013-07-06 DIAGNOSIS — R7302 Impaired glucose tolerance (oral): Secondary | ICD-10-CM

## 2013-07-06 DIAGNOSIS — G5600 Carpal tunnel syndrome, unspecified upper limb: Secondary | ICD-10-CM

## 2013-07-06 NOTE — Progress Notes (Signed)
  Subjective:    Patient ID: Ryan Diaz, male    DOB: February 28, 1946, 67 y.o.   MRN: [id number]  HPI Here with co 2-3 mo right hand numbness only to thumb to start, now involves index and middle finger as well, with mild pain but some decreased grip as well.  No recent trauma, no hx of same. o/w Pt denies chest pain, increased sob or doe, wheezing, orthopnea, PND, increased LE swelling, palpitations, dizziness or syncope.  Pt denies polydipsia, polyuria,  Pt denies new neurological symptoms such as new headache, or facial or extremity weakness or numbness except for the above   Pt states overall good compliance with meds, has been trying to follow lower cholesterol, diet, with wt overall stable  Past Medical History  Diagnosis Date  . 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 ALLERGIC RHINITIS 08/08/2007  . ANXIETY 08/08/2007  . COPD 10/03/2009  . DEPRESSION 08/08/2007  . ERECTILE DYSFUNCTION 08/08/2007  . GENITAL HERPES, HX OF 08/08/2007  . GERD 08/08/2007  . GLUCOSE INTOLERANCE 08/08/2007  . HYPERLIPIDEMIA 08/08/2007  . HYPERTENSION 08/08/2007  . PAIN IN SOFT TISSUES OF LIMB 10/02/2009  . Impaired glucose tolerance 09/26/2011  . Insomnia 05/18/2012   No past surgical history on file.  reports that he quit smoking about 3 years ago. His smoking use included Cigarettes. He smoked 0.00 packs per day. He has never used smokeless tobacco. He reports that he drinks about 9.0 ounces of alcohol per week. He reports that he does not use illicit drugs. family history includes Colon polyps in his brother. No Known Allergies Current Outpatient Prescriptions on File Prior to Visit  Medication Sig Dispense Refill  . lisinopril-hydrochlorothiazide (PRINZIDE,ZESTORETIC) 10-12.5 MG per tablet Take 1 tablet by mouth daily.  903 tablet  3  . omeprazole (PRILOSEC) 20 MG capsule Take 1 capsule (20 mg total) by mouth 2 (two) times daily.  90 capsule  3  . pravastatin (PRAVACHOL) 80 MG tablet Take 1 tablet (80 mg total)  by mouth daily.  90 tablet  3  . terazosin (HYTRIN) 2 MG capsule Take 2 mg by mouth at bedtime.      Marland Kitchen zolpidem (AMBIEN) 10 MG tablet TAKE ONE TABLET BY MOUTH AT BEDTIME AS NEEDED FOR SLEEP  30 tablet  4   No current facility-administered medications on file prior to visit.   Review of Systems     Objective:   Physical Exam BP 94/64  Pulse 101  Temp(Src) 98.3 F (36.8 C) (Oral)  Ht 5\' 11"  (1.803 m)  Wt 170 lb 2 oz (77.168 kg)  BMI 23.74 kg/m2  SpO2 95% VS noted,  Constitutional: Pt appears well-developed and well-nourished.  HENT: Head: NCAT.  Right Ear: External ear normal.  Left Ear: External ear normal.  Eyes: Conjunctivae and EOM are normal. Pupils are equal, round, and reactive to light.  Neck: Normal range of motion. Neck supple.  Cardiovascular: Normal rate and regular rhythm.   Pulmonary/Chest: Effort normal and breath sounds normal.  Abd:  Soft, NT, non-distended, + BS Neurological: Pt is alert. Not confused  motor 5/5 except for 4+/5 right grip, and sens decr to LT right thumb Skin: Skin is warm. No erythema.  Psychiatric: Pt behavior is normal. Thought content normal.     Assessment & Plan:

## 2013-07-06 NOTE — Patient Instructions (Addendum)
Please use the right wrist splint you have at home at night while sleeping  You will be contacted regarding the referral for: neurosurgury  Please continue all other medications as before, and refills have been done if requested.  Please have the pharmacy call with any other refills you may need.  Please return in 6 months, or sooner if needed, with Lab testing done 3-5 days before

## 2013-07-08 NOTE — Assessment & Plan Note (Signed)
midl to mod, for NS referral, wrist splint at night

## 2013-07-08 NOTE — Assessment & Plan Note (Signed)
stable overall by history and exam, recent data reviewed with pt, and pt to continue medical treatment as before,  to f/u any worsening symptoms or concerns Lab Results  Component Value Date   LDLCALC 100* 11/18/2011

## 2013-07-08 NOTE — Assessment & Plan Note (Signed)
stable overall by history and exam, recent data reviewed with pt, and pt to continue medical treatment as before,  to f/u any worsening symptoms or concerns BP Readings from Last 3 Encounters:  07/06/13 94/64  01/06/13 140/70  05/18/12 110/70

## 2013-07-08 NOTE — Assessment & Plan Note (Signed)
stable overall by history and exam, recent data reviewed with pt, and pt to continue medical treatment as before,  to f/u any worsening symptoms or concerns Lab Results  Component Value Date   HGBA1C 5.7 01/06/2013

## 2013-09-15 ENCOUNTER — Other Ambulatory Visit: Payer: Self-pay | Admitting: Internal Medicine

## 2013-09-15 NOTE — Telephone Encounter (Signed)
Done hardcopy to [facility]  

## 2013-09-15 NOTE — Telephone Encounter (Signed)
Faxed hardcopy to [facility] [place] 

## 2013-09-26 ENCOUNTER — Telehealth: Payer: Self-pay

## 2013-09-26 DIAGNOSIS — G4710 Hypersomnia, unspecified: Secondary | ICD-10-CM

## 2013-09-26 NOTE — Telephone Encounter (Signed)
The patient is hoping to get a sleep study test.  He states he is having problems sleeping, but states it is not insomnia. I asked him to come in for an office visit, but the patient stated he simply wants to be referred for the test.    Thanks!

## 2013-09-26 NOTE — Telephone Encounter (Signed)
Sorry, we normally do not order the sleep test, since this can actually take months to get done.  If he significant sleepiness during the day, snores at night, maybe stops breathing at night (usually noticed by family) we can refer to [facility] for further eval including the sleep test, since they are the ones that interpret the test, and treatment

## 2013-09-27 ENCOUNTER — Ambulatory Visit (INDEPENDENT_AMBULATORY_CARE_PROVIDER_SITE_OTHER): Payer: Medicare Other | Admitting: Pulmonary Disease

## 2013-09-27 ENCOUNTER — Encounter: Payer: Self-pay | Admitting: Pulmonary Disease

## 2013-09-27 VITALS — BP 112/70 | HR 75 | Temp 98.5°F | Ht 71.0 in | Wt 171.8 lb

## 2013-09-27 DIAGNOSIS — G4733 Obstructive sleep apnea (adult) (pediatric): Secondary | ICD-10-CM

## 2013-09-27 DIAGNOSIS — G4700 Insomnia, unspecified: Secondary | ICD-10-CM

## 2013-09-27 NOTE — Telephone Encounter (Signed)
Informed the patient of MD instructions.  The patient would like a pulmonary referral asap.  Thanks

## 2013-09-27 NOTE — Assessment & Plan Note (Signed)
Schedule sleep study   Given  daytime somnolence, narrow pharyngeal exam, witnessed apneas & loud snoring, obstructive sleep apnea is probable & an overnight polysomnogram will be scheduled as a split study. The pathophysiology of obstructive sleep apnea , it's cardiovascular consequences & modes of treatment including CPAP were discused with the patient in detail & they evidenced understanding.

## 2013-09-27 NOTE — Telephone Encounter (Signed)
Done per emr 

## 2013-09-27 NOTE — Patient Instructions (Signed)
Schedule sleep study OK to take your ambien the night of study OK to trial melatonin 5mg  2h before bedtime

## 2013-09-27 NOTE — Progress Notes (Signed)
Subjective:    Patient ID: Ryan Diaz, male    DOB: 05-13-1946, 67 y.o.   MRN: [id number]  HPI  67 year old ex-smoker presents for evaluation of witnessed apneas and nocturnal awakenings. Epworth sleepiness score is 6/24 and he reports sleepiness especially while reading. His girlfriend has noted loud snoring and witnessed that he stops breathing during sleep. This occurs independently body position or time of night. He reports nocturnal awakenings every 2 hours. This is somewhat better with Ambien but occurs at least 3 times a night.  He quit smoking in 2010. He works in an Nutritional therapist. He drinks 3-4 beers every night right up to bedtime. Bedtime is around 11 PM, sleep latency is variable. He often starts feeling sleepy around 9 PM while watching TV. He wakes up every 2 hours and is awake by 6 AM but sometimes stays in bed until 7. He has been on Ambien 10 mg for about a year, he takes half a tablet about 30 minutes prior to bedtime about twice a week. He sleeps on his side with 2 pillows. He is to bathroom visits every night. He is out of bed in the morning feeling rested with occasional dryness of mouth. He drinks 4 cups of coffee every morning There is no history suggestive of cataplexy, sleep paralysis or parasomnias   Past Medical History  Diagnosis Date  . 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 ALLERGIC RHINITIS 08/08/2007  . ANXIETY 08/08/2007  . COPD 10/03/2009  . DEPRESSION 08/08/2007  . ERECTILE DYSFUNCTION 08/08/2007  . GENITAL HERPES, HX OF 08/08/2007  . GERD 08/08/2007  . GLUCOSE INTOLERANCE 08/08/2007  . HYPERLIPIDEMIA 08/08/2007  . HYPERTENSION 08/08/2007  . PAIN IN SOFT TISSUES OF LIMB 10/02/2009  . Impaired glucose tolerance 09/26/2011  . Insomnia 05/18/2012    No past surgical history on file.  No Known Allergies  History   Social History  . Marital Status: Single    Spouse Name: N/A    Number of Children: N/A  . Years of Education: N/A   Occupational History   . self employed auto body work    Social History Main Topics  . Smoking status: Former Smoker -- 1.50 packs/day for 35 years    Types: Cigarettes    Quit date: 09/04/2009  . Smokeless tobacco: Never Used  . Alcohol Use: 9.0 oz/week    18 drink(s) per week  . Drug Use: No  . Sexual Activity: Not on file   Other Topics Concern  . Not on file   Social History Narrative  . No narrative on file    Family History  Problem Relation Age of Onset  . Colon polyps Brother      Review of Systems  Constitutional: Negative for fever, appetite change, fatigue and unexpected weight change.  HENT: Negative for ear pain, nosebleeds, postnasal drip, rhinorrhea, sinus pressure, sneezing, sore throat and trouble swallowing.   Eyes: Negative for pain, redness and itching.  Respiratory: Negative for cough, chest tightness, shortness of breath and wheezing.   Cardiovascular: Negative for chest pain.  Gastrointestinal: Negative for abdominal pain, diarrhea, constipation and abdominal distention.  Endocrine: Negative for cold intolerance, heat intolerance and polyphagia.  Genitourinary: Negative for dysuria and difficulty urinating.  Musculoskeletal: Negative for arthralgias, back pain, neck pain and neck stiffness.  Skin: Negative for color change, pallor and rash.  Allergic/Immunologic: Negative for food allergies.  Neurological: Positive for light-headedness and numbness. Negative for weakness and headaches.  Hematological: Does not bruise/bleed easily.  Psychiatric/Behavioral: Positive for sleep disturbance. Negative for dysphoric mood. The patient is not nervous/anxious.        Objective:   Physical Exam  Gen. Pleasant, well-nourished, in no distress, normal affect ENT - no lesions, no post nasal drip, class 2 airway Neck: No JVD, no thyromegaly, no carotid bruits Lungs: no use of accessory muscles, no dullness to percussion, clear without rales or rhonchi  Cardiovascular: Rhythm  regular, heart sounds  normal, no murmurs or gallops, no peripheral edema Abdomen: soft and non-tender, no hepatosplenomegaly, BS normal. Musculoskeletal: No deformities, no cyanosis or clubbing Neuro:  alert, non focal        Assessment & Plan:

## 2013-09-27 NOTE — Assessment & Plan Note (Signed)
OK to take your ambien the night of study OK to trial melatonin 5mg  2h before bedtime  We discussed decreasing ETOH intake - no beer 2h before bedtime Decrease caffeine intake

## 2013-10-31 ENCOUNTER — Ambulatory Visit (HOSPITAL_BASED_OUTPATIENT_CLINIC_OR_DEPARTMENT_OTHER): Payer: Medicare Other | Attending: Pulmonary Disease

## 2013-10-31 VITALS — Ht 71.0 in | Wt 165.0 lb

## 2013-10-31 DIAGNOSIS — G4733 Obstructive sleep apnea (adult) (pediatric): Secondary | ICD-10-CM

## 2013-10-31 DIAGNOSIS — G4700 Insomnia, unspecified: Secondary | ICD-10-CM | POA: Insufficient documentation

## 2013-10-31 DIAGNOSIS — Z87891 Personal history of nicotine dependence: Secondary | ICD-10-CM | POA: Insufficient documentation

## 2013-11-07 ENCOUNTER — Telehealth: Payer: Self-pay | Admitting: Pulmonary Disease

## 2013-11-07 DIAGNOSIS — G4733 Obstructive sleep apnea (adult) (pediatric): Secondary | ICD-10-CM

## 2013-11-07 DIAGNOSIS — G4730 Sleep apnea, unspecified: Secondary | ICD-10-CM

## 2013-11-07 DIAGNOSIS — G4710 Hypersomnia, unspecified: Secondary | ICD-10-CM

## 2013-11-07 NOTE — Sleep Study (Signed)
[facility]  NAME: Ryan Diaz  DATE OF BIRTH: 04-25-1946  MEDICAL RECORD NUMBER [id number]  LOCATION: [facility]  PHYSICIAN: Meaghann Choo V.  DATE OF STUDY:11/07/2013   SLEEP STUDY TYPE: Nocturnal Polysomnogram               REFERRING PHYSICIAN: Oretha Milch, MD  INDICATION FOR STUDY:67 year old ex-smoker presents for evaluation of witnessed apneas and nocturnal awakenings. He has a history of alcohol use and sleep onset insomnia. At the time of this study ,they weighed 165 pounds with a height of  5 ft 11 inches and the BMI of 23, neck size of 16 inches. Epworth sleepiness score was 8   This nocturnal polysomnogram was performed with a sleep technologist in attendance. EEG, EOG,EMG and respiratory parameters recorded. Sleep stages, arousals, limb movements and respiratory data was scored according to criteria laid out by the American Academy of sleep medicine.  SLEEP ARCHITECTURE: Lights out was at 21-57 PM and lights on was at 04-29 AM. Total sleep time was 183 minutes with a sleep period time of 328 minutes and a poor sleep efficiency of 47 %. Sleep latency was 63 minutes with latency to REM sleep of 105 minutes and wake after sleep onset of 146 minutes. . Sleep stages as a percentage of total sleep time was N1 -31%,N2- 39% and REM sleep 30% ( 55 minutes) . The longest period of REM sleep was around 1 AM.   AROUSAL DATA : There were 108 arousals with an arousal index of 35 events per hour. Of these 7 were spontaneous, and 101 were associated with respiratory events and 0 were associated periodic limb movements  RESPIRATORY DATA: There were 85 obstructive apneas, 1 central apneas, 2 mixed apneas and 71 hypopneas with apnea -hypopnea index of 52 events per hour. There were 1 RERAs with an RDI of 52 events per hour. There was no relation to sleep stage or body position.  MOVEMENT/PARASOMNIA: There were 0 PLMS with a PLM index of 0 events per  hour. The PLM arousal index was 0 events per hour.  OXYGEN DATA: The lowest desaturation was 83% during REM sleep and the desaturation index was 64 per hour. The saturations stayed below 88% for 1.7 minutes.  CARDIAC DATA: The low heart rate was 30 beats per minute. The high heart rate recorded was an artifact. No arrhythmias were noted   IMPRESSION :  1. Severe obstructive sleep apnea with hypopneas causing sleep fragmentation and mild oxygen desaturation. 2. No evidence of cardiac arrhythmias or behavioral disturbance during sleep. 3. No periodic limb movements were noted 4. Sleep efficiency was poor which may be related to for side effect or effect of alcohol. Also note the large proportion of stage I sleep.  RECOMMENDATION:    1. The treatment options for this degree of sleep disordered breathing includes weight loss and CPAP therapy. 2. Patient should be cautioned against driving when sleepy 3. They should be asked to avoid medications with sedative side effects. Decreased alcohol intake prior to sleep would recommend.  Allina Riches V. Diplomate, Biomedical engineer of Sleep Medicine  ELECTRONICALLY SIGNED ON:  10/17/2013, 1:24 PM [facility] PH: [phone]   FX: [phone] ACCREDITED BY THE AMERICAN ACADEMY OF SLEEP MEDICINE

## 2013-11-07 NOTE — Telephone Encounter (Signed)
Although he did not sleep very well, he stopped breathing average of 52 times in our or total sleep time of 3 hours. Would recommend starting CPAP therapy. If agreeable can start auto CPAP with medium fullface mask, humidity and download in one month or as discussed, can proceed with CPAP titration in the lab

## 2013-11-07 NOTE — Telephone Encounter (Signed)
ATC pt NA wCB

## 2013-11-08 NOTE — Telephone Encounter (Signed)
lmomtcb x1 

## 2013-11-08 NOTE — Telephone Encounter (Signed)
Called spoke with patient and advised of sleep study results / recs as stated by RA below.  Pt verbalized his understanding but would prefer to not start CPAP therapy at this time.  Pt did ask for any alternative therapies.  Advised pt RA did not mention any specific alternatives but would route message to RA for any additional recommendations.  Pt did mention coming in for ov to discuss face-to-face but there are no openings in the [place] office until 2.12.15 and none in the [place] office until 2.5.15.  RA is scheduled nightfloat this week and is rounding at [facility] next week.  Pt would really like to be seen to discuss or speak with RA over the phone.  Pt is okay to wait until next week if RA will able to call then.    Dr Vassie Loll please advise, thank you.

## 2013-11-08 NOTE — Telephone Encounter (Signed)
Pt returned call & can be reached at [phone].  Ryan Diaz

## 2013-11-28 ENCOUNTER — Telehealth: Payer: Self-pay

## 2013-11-28 NOTE — Telephone Encounter (Signed)
The patient called and is hoping to get a return phone call to discuss previous test results on his hand.  Pt callback - [phone]

## 2013-11-28 NOTE — Telephone Encounter (Signed)
Called the patient back and he is requesting the results from test at Dr. Reginia NaasAlva's office.

## 2013-11-28 NOTE — Telephone Encounter (Signed)
Ok to forward to Dr Vassie LollAlva for sleep study results

## 2013-11-29 NOTE — Telephone Encounter (Signed)
Patient called again, relayed MD's response from yesterday's notes & transferred him to pulmonary dept.

## 2013-11-29 NOTE — Telephone Encounter (Signed)
Spoke with pt. He is scheduled to come in and see RA 12/06/13 at 10 AM for appt.

## 2013-11-29 NOTE — Telephone Encounter (Signed)
OK to double book

## 2013-12-06 ENCOUNTER — Encounter: Payer: Self-pay | Admitting: Pulmonary Disease

## 2013-12-06 ENCOUNTER — Telehealth: Payer: Self-pay | Admitting: Internal Medicine

## 2013-12-06 ENCOUNTER — Ambulatory Visit (INDEPENDENT_AMBULATORY_CARE_PROVIDER_SITE_OTHER): Payer: Medicare Other | Admitting: Pulmonary Disease

## 2013-12-06 VITALS — BP 156/82 | HR 100 | Temp 97.5°F | Ht 71.0 in | Wt 168.8 lb

## 2013-12-06 DIAGNOSIS — G4733 Obstructive sleep apnea (adult) (pediatric): Secondary | ICD-10-CM

## 2013-12-06 DIAGNOSIS — G4700 Insomnia, unspecified: Secondary | ICD-10-CM

## 2013-12-06 NOTE — Patient Instructions (Signed)
Trial of autoCPAP with a nasal mask Call us with issues OK to use melatonin 5-10 mg at bedtime

## 2013-12-06 NOTE — Telephone Encounter (Signed)
I dont see results such as for a NCS/EMG (nerve test) on his chart   Ok to ask pt where this was done, then call for results

## 2013-12-06 NOTE — Telephone Encounter (Signed)
Called the patient back to inquire about test, no answer and no VM.

## 2013-12-06 NOTE — Progress Notes (Signed)
   Subjective:    Patient ID: Ryan Diaz, male    DOB: 05/22/1946, 68 y.o.   MRN: [id number]  HPI  68 year old ex-smoker for FU of OSA & sleep onset insomnia. presented with witnessed apneas and nocturnal awakenings.  Epworth sleepiness score is 6/24 and he reports sleepiness especially while reading. His girlfriend has noted loud snoring and witnessed that he stops breathing during sleep. This occurs independently body position or time of night. He reports nocturnal awakenings every 2 hours. This is somewhat better with Ambien but occurs at least 3 times a night.  He quit smoking in 2010. He works in an [employer]. He drinks 3-4 beers every night right up to bedtime. Bedtime is around 11 PM, sleep latency is variable. He often starts feeling sleepy around 9 PM while watching TV. He wakes up every 2 hours and is awake by 6 AM but sometimes stays in bed until 7. He has been on Ambien 10 mg for about a year, he takes half a tablet about 30 minutes prior to bedtime about twice a week. He sleeps on his side with 2 pillows. He has 2-3  bathroom visits every night. He is out of bed in the morning feeling rested with occasional dryness of mouth.  He drinks 4 cups of coffee every morning  >> melatonin, decrease ETOH He has a history of alcohol use and sleep onset insomnia. At the time of this study ,they weighed 165 pounds with a height of 5 ft 11 inches and the BMI of 23, neck size of 16 inches. Epworth sleepiness score was 8  PSG >> AHI of 52/h, total sleep time of 3 hours   Review of Systems neg for any significant sore throat, dysphagia, itching, sneezing, nasal congestion or excess/ purulent secretions, fever, chills, sweats, unintended wt loss, pleuritic or exertional cp, hempoptysis, orthopnea pnd or change in chronic leg swelling. Also denies presyncope, palpitations, heartburn, abdominal pain, nausea, vomiting, diarrhea or change in bowel or urinary habits, dysuria,hematuria, rash,  arthralgias, visual complaints, headache, numbness weakness or ataxia.     Objective:   Physical Exam  Gen. Pleasant, well-nourished, in no distress ENT - no lesions, no post nasal drip Neck: No JVD, no thyromegaly, no carotid bruits Lungs: no use of accessory muscles, no dullness to percussion, clear without rales or rhonchi  Cardiovascular: Rhythm regular, heart sounds  normal, no murmurs or gallops, no peripheral edema Musculoskeletal: No deformities, no cyanosis or clubbing        Assessment & Plan:

## 2013-12-06 NOTE — Assessment & Plan Note (Signed)
OK to use melatonin 5-10 mg at bedtime Discussed sleep hygiene & effect of ETOH on sleep

## 2013-12-06 NOTE — Assessment & Plan Note (Addendum)
Trial of autoCPAP with a nasal mask Call us with issues  Weight loss encouraged, compliance with goal of at least 4-6 hrs every night is the expectation. Advised against medications with sedative side effects Cautioned against driving when sleepy - understanding that sleepiness will vary on a day to day basis

## 2013-12-06 NOTE — Telephone Encounter (Signed)
12/06/2013  Pt wants to know if Dr. Jonny RuizJohn received any test results for the nerve test he had done for numbness in right hand.

## 2013-12-07 NOTE — Telephone Encounter (Signed)
Called the patient back and he was seen in August by PCP for carpal tunnel.  PCP then referred to Dr. Phoebe PerchHirsch (neurosurgeon) and was seen in September by him.  The nerve test was ordered by Dr. Phoebe PerchHirsch.  Informed the patient of Number to call Dr. Phoebe PerchHirsch office to received results and have them fax to Dr. Jonny RuizJohn as well.  Patient agreed to do so.

## 2013-12-20 ENCOUNTER — Telehealth: Payer: Self-pay | Admitting: Pulmonary Disease

## 2013-12-20 NOTE — Telephone Encounter (Signed)
Will forward to RA as FYI 

## 2014-01-16 ENCOUNTER — Ambulatory Visit: Payer: Medicare Other | Admitting: Pulmonary Disease

## 2014-04-10 ENCOUNTER — Encounter: Payer: Self-pay | Admitting: Internal Medicine

## 2014-04-10 ENCOUNTER — Other Ambulatory Visit (INDEPENDENT_AMBULATORY_CARE_PROVIDER_SITE_OTHER): Payer: Medicare Other

## 2014-04-10 ENCOUNTER — Ambulatory Visit (INDEPENDENT_AMBULATORY_CARE_PROVIDER_SITE_OTHER): Payer: Medicare Other | Admitting: Internal Medicine

## 2014-04-10 VITALS — BP 132/82 | HR 72 | Temp 97.9°F | Ht 71.0 in | Wt 168.0 lb

## 2014-04-10 DIAGNOSIS — R7309 Other abnormal glucose: Secondary | ICD-10-CM

## 2014-04-10 DIAGNOSIS — I10 Essential (primary) hypertension: Secondary | ICD-10-CM

## 2014-04-10 DIAGNOSIS — M25572 Pain in left ankle and joints of left foot: Secondary | ICD-10-CM

## 2014-04-10 DIAGNOSIS — Z23 Encounter for immunization: Secondary | ICD-10-CM

## 2014-04-10 DIAGNOSIS — E785 Hyperlipidemia, unspecified: Secondary | ICD-10-CM

## 2014-04-10 DIAGNOSIS — R7302 Impaired glucose tolerance (oral): Secondary | ICD-10-CM

## 2014-04-10 DIAGNOSIS — Z0000 Encounter for general adult medical examination without abnormal findings: Secondary | ICD-10-CM

## 2014-04-10 DIAGNOSIS — M25579 Pain in unspecified ankle and joints of unspecified foot: Secondary | ICD-10-CM

## 2014-04-10 DIAGNOSIS — Z136 Encounter for screening for cardiovascular disorders: Secondary | ICD-10-CM

## 2014-04-10 DIAGNOSIS — M25571 Pain in right ankle and joints of right foot: Secondary | ICD-10-CM | POA: Insufficient documentation

## 2014-04-10 LAB — CBC WITH DIFFERENTIAL/PLATELET
Basophils Absolute: 0.1 10*3/uL (ref 0.0–0.1)
Basophils Relative: 0.7 % (ref 0.0–3.0)
Eosinophils Absolute: 0.4 10*3/uL (ref 0.0–0.7)
Eosinophils Relative: 4.9 % (ref 0.0–5.0)
HCT: 44.9 % (ref 39.0–52.0)
Hemoglobin: 15.4 g/dL (ref 13.0–17.0)
Lymphocytes Relative: 33.3 % (ref 12.0–46.0)
Lymphs Abs: 2.5 10*3/uL (ref 0.7–4.0)
MCHC: 34.2 g/dL (ref 30.0–36.0)
MCV: 96.5 fl (ref 78.0–100.0)
Monocytes Absolute: 0.8 10*3/uL (ref 0.1–1.0)
Monocytes Relative: 10.4 % (ref 3.0–12.0)
Neutro Abs: 3.8 10*3/uL (ref 1.4–7.7)
Neutrophils Relative %: 50.7 % (ref 43.0–77.0)
Platelets: 278 10*3/uL (ref 150.0–400.0)
RBC: 4.65 Mil/uL (ref 4.22–5.81)
RDW: 13 % (ref 11.5–15.5)
WBC: 7.6 10*3/uL (ref 4.0–10.5)

## 2014-04-10 LAB — HEPATIC FUNCTION PANEL
ALT: 23 U/L (ref 0–53)
AST: 22 U/L (ref 0–37)
Albumin: 4.3 g/dL (ref 3.5–5.2)
Alkaline Phosphatase: 64 U/L (ref 39–117)
Bilirubin, Direct: 0.1 mg/dL (ref 0.0–0.3)
Total Bilirubin: 0.9 mg/dL (ref 0.2–1.2)
Total Protein: 7.8 g/dL (ref 6.0–8.3)

## 2014-04-10 LAB — URINALYSIS, ROUTINE W REFLEX MICROSCOPIC
Bilirubin Urine: NEGATIVE
Hgb urine dipstick: NEGATIVE
Ketones, ur: NEGATIVE
Leukocytes, UA: NEGATIVE
Nitrite: NEGATIVE
RBC / HPF: NONE SEEN (ref 0–?)
Specific Gravity, Urine: 1.02 (ref 1.000–1.030)
Total Protein, Urine: NEGATIVE
Urine Glucose: NEGATIVE
Urobilinogen, UA: 0.2 (ref 0.0–1.0)
WBC, UA: NONE SEEN (ref 0–?)
pH: 6 (ref 5.0–8.0)

## 2014-04-10 LAB — LIPID PANEL
Cholesterol: 214 mg/dL — ABNORMAL HIGH (ref 0–200)
HDL: 36.5 mg/dL — ABNORMAL LOW (ref >39.00)
LDL Cholesterol: 125 mg/dL — ABNORMAL HIGH (ref 0–99)
Total CHOL/HDL Ratio: 6
Triglycerides: 265 mg/dL — ABNORMAL HIGH (ref 0.0–149.0)
VLDL: 53 mg/dL — ABNORMAL HIGH (ref 0.0–40.0)

## 2014-04-10 LAB — BASIC METABOLIC PANEL
BUN: 22 mg/dL (ref 6–23)
CO2: 29 mEq/L (ref 19–32)
Calcium: 10.3 mg/dL (ref 8.4–10.5)
Chloride: 99 mEq/L (ref 96–112)
Creatinine, Ser: 1.1 mg/dL (ref 0.4–1.5)
GFR: 86.48 mL/min (ref >60.00)
Glucose, Bld: 93 mg/dL (ref 70–99)
Potassium: 4.2 mEq/L (ref 3.5–5.1)
Sodium: 137 mEq/L (ref 135–145)

## 2014-04-10 LAB — HEMOGLOBIN A1C: Hgb A1c MFr Bld: 5.8 % (ref 4.6–6.5)

## 2014-04-10 LAB — PSA: PSA: 1.37 ng/mL (ref 0.10–4.00)

## 2014-04-10 LAB — TSH: TSH: 0.74 u[IU]/mL (ref 0.35–4.50)

## 2014-04-10 NOTE — Addendum Note (Signed)
Addended by: Scharlene Gloss B on: 04/10/2014 10:37 AM   Modules accepted: Orders

## 2014-04-10 NOTE — Progress Notes (Signed)
Pre visit review using our clinic review tool, if applicable. No additional management support is needed unless otherwise documented below in the visit note. 

## 2014-04-10 NOTE — Assessment & Plan Note (Signed)
For sport med referral 

## 2014-04-10 NOTE — Progress Notes (Signed)
Subjective:    Patient ID: Ryan Diaz, male    DOB: 01/25/1946, 68 y.o.   MRN: [id number]  HPI Here for wellness and f/u;  Overall doing ok;  Pt denies CP, worsening SOB, DOE, wheezing, orthopnea, PND, worsening LE edema, palpitations, dizziness or syncope.  Pt denies neurological change such as new headache, facial or extremity weakness.  Pt denies polydipsia, polyuria, or low sugar symptoms. Pt states overall good compliance with treatment and medications, good tolerability, and has been trying to follow lower cholesterol diet.  Pt denies worsening depressive symptoms, suicidal ideation or panic. No fever, night sweats, wt loss, loss of appetite, or other constitutional symptoms.  Pt states good ability with ADL's, has low fall risk, home safety reviewed and adequate, no other significant changes in hearing or vision, and only occasionally active with exercise, mostly due to recurrent pain and grinding to right lateral ankle, with pain but no swelling.  Also has right CTS, has been deferring surgury due to the time to recuperate not appealing to him, but may reconsider soon.  Also seen at [facility]. Due for prenar, Due for labs.  No longer drinking liqour, only few beers occasionally. Past Medical History  Diagnosis Date  . 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 ALLERGIC RHINITIS 08/08/2007  . ANXIETY 08/08/2007  . COPD 10/03/2009  . DEPRESSION 08/08/2007  . ERECTILE DYSFUNCTION 08/08/2007  . GENITAL HERPES, HX OF 08/08/2007  . GERD 08/08/2007  . GLUCOSE INTOLERANCE 08/08/2007  . HYPERLIPIDEMIA 08/08/2007  . HYPERTENSION 08/08/2007  . PAIN IN SOFT TISSUES OF LIMB 10/02/2009  . Impaired glucose tolerance 09/26/2011  . Insomnia 05/18/2012   No past surgical history on file.  reports that he quit smoking about 4 years ago. His smoking use included Cigarettes. He has a 52.5 pack-year smoking history. He has never used smokeless tobacco. He reports that he drinks about 9 ounces of alcohol per week. He  reports that he does not use illicit drugs. family history includes Colon polyps in his brother. No Known Allergies Current Outpatient Prescriptions on File Prior to Visit  Medication Sig Dispense Refill  . lisinopril-hydrochlorothiazide (PRINZIDE,ZESTORETIC) 10-12.5 MG per tablet Take 1 tablet by mouth daily.  903 tablet  3  . omeprazole (PRILOSEC) 20 MG capsule Take 1 capsule (20 mg total) by mouth 2 (two) times daily.  90 capsule  3  . pravastatin (PRAVACHOL) 80 MG tablet Take 1 tablet (80 mg total) by mouth daily.  90 tablet  3  . terazosin (HYTRIN) 2 MG capsule Take 2 mg by mouth at bedtime.      Marland Kitchen. zolpidem (AMBIEN) 10 MG tablet TAKE ONE TABLET BY MOUTH AT BEDTIME AS NEEDED FOR SLEEP  30 tablet  5   No current facility-administered medications on file prior to visit.   Review of Systems Constitutional: Negative for increased diaphoresis, other activity, appetite or other siginficant weight change  HENT: Negative for worsening hearing loss, ear pain, facial swelling, mouth sores and neck stiffness.   Eyes: Negative for other worsening pain, redness or visual disturbance.  Respiratory: Negative for shortness of breath and wheezing.   Cardiovascular: Negative for chest pain and palpitations.  Gastrointestinal: Negative for diarrhea, blood in stool, abdominal distention or other pain Genitourinary: Negative for hematuria, flank pain or change in urine volume.  Musculoskeletal: Negative for myalgias or other joint complaints.  Skin: Negative for color change and wound.  Neurological: Negative for syncope and numbness. other than noted Hematological: Negative for adenopathy. or other  swelling Psychiatric/Behavioral: Negative for hallucinations, self-injury, decreased concentration or other worsening agitation.      Objective:   Physical Exam BP 132/82  Pulse 72  Temp(Src) 97.9 F (36.6 C) (Oral)  Ht 5\' 11"  (1.803 m)  Wt 168 lb (76.204 kg)  BMI 23.44 kg/m2  SpO2 99% VS noted,    Constitutional: Pt is oriented to person, place, and time. Appears well-developed and well-nourished.  Head: Normocephalic and atraumatic.  Right Ear: External ear normal.  Left Ear: External ear normal.  Nose: Nose normal.  Mouth/Throat: Oropharynx is clear and moist.  Eyes: Conjunctivae and EOM are normal. Pupils are equal, round, and reactive to light.  Neck: Normal range of motion. Neck supple. No JVD present. No tracheal deviation present.  Cardiovascular: Normal rate, regular rhythm, normal heart sounds and intact distal pulses.   Pulmonary/Chest: Effort normal and breath sounds without rales or wheezing  Abdominal: Soft. Bowel sounds are normal. NT. No HSM  Musculoskeletal: Normal range of motion. Exhibits no edema.  Lymphadenopathy:  Has no cervical adenopathy.  Neurological: Pt is alert and oriented to person, place, and time. Pt has normal reflexes. No cranial nerve deficit. Motor grossly intact Skin: Skin is warm and dry. No rash noted.  Psychiatric:  Has normal mood and affect. Behavior is normal.  Right ankle wihtout effusion but decr ROM    Assessment & Plan:

## 2014-04-10 NOTE — Assessment & Plan Note (Signed)

## 2014-04-10 NOTE — Assessment & Plan Note (Signed)
Also for a1c 

## 2014-04-10 NOTE — Patient Instructions (Addendum)
You had the new Prevnar pneumonia shot today  Since we did not have one today, please purchase a right wrist splint for your carpal tunnel at your local drug store or medical supply store (such as [facility] on [address])  Please continue all other medications as before, and refills have been done if requested. Please have the pharmacy call with any other refills you may need.  Please continue your efforts at being more active, low cholesterol diet, and weight control.  Please keep your appointments with your specialists as you have planned, and your [facility]  Please go to the LAB in the Basement (turn left off the elevator) for the tests to be done today  You will be contacted by phone if any changes need to be made immediately.  Otherwise, you will receive a letter about your results with an explanation, but please check with MyChart first.  You will be contacted regarding the referral for: Dr Katrinka Blazing [facility] medicine in this office for your right ankle  Please return in 6 months, or sooner if needed

## 2014-04-12 ENCOUNTER — Encounter: Payer: Self-pay | Admitting: Internal Medicine

## 2014-04-19 ENCOUNTER — Ambulatory Visit: Payer: Medicare Other | Admitting: Internal Medicine

## 2014-05-03 ENCOUNTER — Other Ambulatory Visit: Payer: Medicare Other

## 2014-05-03 ENCOUNTER — Encounter: Payer: Self-pay | Admitting: Family Medicine

## 2014-05-03 ENCOUNTER — Other Ambulatory Visit (INDEPENDENT_AMBULATORY_CARE_PROVIDER_SITE_OTHER): Payer: Medicare Other

## 2014-05-03 ENCOUNTER — Ambulatory Visit (INDEPENDENT_AMBULATORY_CARE_PROVIDER_SITE_OTHER): Payer: Medicare Other | Admitting: Family Medicine

## 2014-05-03 ENCOUNTER — Ambulatory Visit (INDEPENDENT_AMBULATORY_CARE_PROVIDER_SITE_OTHER)
Admission: RE | Admit: 2014-05-03 | Discharge: 2014-05-03 | Disposition: A | Discharge status: Home / Self Care | Payer: Medicare Other | Source: Ambulatory Visit | Attending: Family Medicine | Admitting: Family Medicine

## 2014-05-03 VITALS — BP 132/80 | HR 82 | Ht 71.0 in | Wt 167.0 lb

## 2014-05-03 DIAGNOSIS — M25571 Pain in right ankle and joints of right foot: Secondary | ICD-10-CM

## 2014-05-03 DIAGNOSIS — G5601 Carpal tunnel syndrome, right upper limb: Secondary | ICD-10-CM

## 2014-05-03 DIAGNOSIS — M25579 Pain in unspecified ankle and joints of unspecified foot: Secondary | ICD-10-CM

## 2014-05-03 DIAGNOSIS — G5600 Carpal tunnel syndrome, unspecified upper limb: Secondary | ICD-10-CM

## 2014-05-03 MED ORDER — DICLOFENAC SODIUM 2 % TD SOLN: 2.0000 "application " | Freq: Two times a day (BID) | TRANSDERMAL | Status: DC

## 2014-05-03 NOTE — Assessment & Plan Note (Signed)
Patient's right ankle pain is nonspecific. There is no swelling in patient does have mild osteoarthritic change. Patient will try better shoes, icing, topical anti-inflammatories, and home exercise program. Patient will follow up in 3 weeks for further evaluation.

## 2014-05-03 NOTE — Patient Instructions (Signed)
Good to meet you Exercises 3 times a week for all your problems.  Ice bath for the ankle for 20 minutes at end of the day.  Try pennsaid twice daily.  Take tylenol 650 mg three times a day is the best evidence based medicine we have for arthritis.  Glucosamine sulfate 750mg  twice a day is a supplement that has been shown to help moderate to severe arthritis. Vitamin D 2000 IU daily Fish oil 2 grams daily.  Tumeric 500mg  twice daily.  Capsaicin topically up to four times a day may also help with pain. Shoes I recommend include Mardi MainlandKeen, Merrell, Dansko and New balance Wear the wrist brace at night and consider during the day.  If wrist still hurts we can try injections.  Go downstairs for xray of foot Come back in 3-4 weeks.

## 2014-05-03 NOTE — Assessment & Plan Note (Signed)
Patient was given a brace was fitted by me today. We discussed icing protocol they can be beneficial. We also discussed home exercises program and was given a handout. Patient will try these interventions and come back and see me again in 3 weeks. If he continues to have trouble I would consider an injection under ultrasound guidance.

## 2014-05-03 NOTE — Progress Notes (Signed)
Ryan ScaleZach Smith D.O. [facility] [address] [place], [place] [address] Phone: [phone] Subjective:    I'm seeing this patient by the request  of:  Oliver BarreJames John, MD   CC: Right ankle pain and bilateral wrist pain  YQM:[id number]:Subjective Ryan Diaz is a 68 y.o. male coming in with complaint of right ankle pain. Patient states he's had this pain for years but seems to be getting worse over the course last month. Patient has tried some over-the-counter medications without significant improvement. Patient has not tried any exercises has not tried changing shoes. Patient denies any true injury. Patient states he is still able to ambulate without any significant difficulties. Denies any swelling denies any numbness. Patient Hilda sleep well through the night. Patient is a severity of 6/10.  Patient also complains of bilateral wrist pain. Patient has been seen by other providers and had been told previously after an EMG that he does have severe carpal tunnel syndrome right greater than left. Patient is to wear a brace but no longer does. Patient has noticed some mild weakness and states that the pain comes and goes but is not incapacitating. Patient states that the weakness can be though. Patient has tried physical therapy without any significant improvement. Patient does not want to take anti-inflammatories and has never had injections.     Past medical history, social, surgical and family history all reviewed in electronic medical record.   Review of Systems: No headache, visual changes, nausea, vomiting, diarrhea, constipation, dizziness, abdominal pain, skin rash, fevers, chills, night sweats, weight loss, swollen lymph nodes, body aches, joint swelling, muscle aches, chest pain, shortness of breath, mood changes.   Objective Blood pressure 132/80, pulse 82, height 5\' 11"  (1.803 m), weight 167 lb (75.751 kg), SpO2 97.00%.  General: No apparent distress alert and oriented x3 mood and  affect normal, dressed appropriately.  HEENT: Pupils equal, extraocular movements intact  Respiratory: Patient's speak in full sentences and does not appear short of breath  Cardiovascular: No lower extremity edema, non tender, no erythema  Skin: Warm dry intact with no signs of infection or rash on extremities or on axial skeleton.  Abdomen: Soft nontender  Neuro: Cranial nerves II through XII are intact, neurovascularly intact in all extremities with 2+ DTRs and 2+ pulses.  Lymph: No lymphadenopathy of posterior or anterior cervical chain or axillae bilaterally.  Gait normal with good balance and coordination.  MSK:  Non tender with full range of motion and good stability and symmetric strength and tone of shoulders, elbows,  hip, knee and ankles bilaterally.  Wrist: Bilateral Patient does have atrophy of the thenar eminence bilaterally ROM smooth and normal with good flexion and extension and ulnar/radial deviation that is symmetrical with opposite wrist. Palpation is normal over metacarpals, navicular, lunate, and TFCC; tendons without tenderness/ swelling No snuffbox tenderness. No tenderness over Canal of Guyon. Strength 5/5 in all directions without pain. Negative Finkelstein, positive tinel's and phalens. Negative Watson's test.  Ankle: Right No visible erythema or swelling. Range of motion shows patient does like the last 10 of extension. Strength is 5/5 in all directions. Stable lateral and medial ligaments; squeeze test and kleiger test unremarkable; Talar dome nontender; No pain at base of 5th MT; No tenderness over cuboid; No tenderness over N spot or navicular prominence No tenderness on posterior aspects of lateral and medial malleolus No sign of peroneal tendon subluxations or tenderness to palpation Negative tarsal tunnel tinel's Able to walk 4 steps.  MSK US performed of: Right ankle This study was ordered, performed, and interpreted by Terrilee FilesZach Smith  D.O.  Foot/Ankle:   All structures visualized.   Talar dome does have mild osteophytic changes Ankle mortise without effusion. Peroneus longus and brevis tendons unremarkable on long and transverse views without sheath effusions. Posterior tibialis, flexor hallucis longus, and flexor digitorum longus tendons unremarkable on long and transverse views without significant sheath effusions. Achilles tendon visualized along length of tendon and unremarkable on long and transverse views without sheath effusion. Anterior Talofibular Ligament and Calcaneofibular Ligaments unremarkable and intact. Mild chronic changes Deltoid Ligament unremarkable and intact. Plantar fascia intact and without effusion, normal thickness. No increased doppler signal, cap sign, or thickening of tibial cortex. Power doppler signal normal.  IMPRESSION:  Mild osteoarthritis with mild nonspecific hypoechoic changes of the tendon sheaths on the medial aspect of the ankle.      Impression and Recommendations:     This case required medical decision making of moderate complexity.

## 2014-05-08 ENCOUNTER — Telehealth: Payer: Self-pay | Admitting: Internal Medicine

## 2014-05-08 NOTE — Telephone Encounter (Signed)
Pt came in to request another list of medications to purchase over the counter. Pt states Dr Katrinka BlazingSmith wrote him a med list during his last visit and pt lost it. Please contact pt to provide him with requested meds.

## 2014-05-08 NOTE — Telephone Encounter (Signed)
lmovm for pt to return call.  

## 2014-05-09 NOTE — Telephone Encounter (Signed)
Discussed with pt

## 2014-05-28 ENCOUNTER — Other Ambulatory Visit: Payer: Self-pay | Admitting: Internal Medicine

## 2014-05-31 ENCOUNTER — Ambulatory Visit: Payer: Medicare Other | Admitting: Family Medicine

## 2015-01-01 ENCOUNTER — Encounter (HOSPITAL_COMMUNITY): Payer: Self-pay | Admitting: *Deleted

## 2015-01-01 ENCOUNTER — Ambulatory Visit: Payer: Self-pay | Admitting: Internal Medicine

## 2015-01-01 ENCOUNTER — Emergency Department (HOSPITAL_COMMUNITY)
Admission: EM | Admit: 2015-01-01 | Discharge: 2015-01-01 | Disposition: A | Discharge status: Home / Self Care | Payer: Medicare PPO | Attending: Emergency Medicine | Admitting: Emergency Medicine

## 2015-01-01 ENCOUNTER — Telehealth: Payer: Self-pay | Admitting: Internal Medicine

## 2015-01-01 DIAGNOSIS — I959 Hypotension, unspecified: Secondary | ICD-10-CM | POA: Diagnosis not present

## 2015-01-01 DIAGNOSIS — R42 Dizziness and giddiness: Secondary | ICD-10-CM | POA: Diagnosis present

## 2015-01-01 DIAGNOSIS — G4700 Insomnia, unspecified: Secondary | ICD-10-CM | POA: Insufficient documentation

## 2015-01-01 DIAGNOSIS — E860 Dehydration: Secondary | ICD-10-CM

## 2015-01-01 DIAGNOSIS — J449 Chronic obstructive pulmonary disease, unspecified: Secondary | ICD-10-CM | POA: Insufficient documentation

## 2015-01-01 DIAGNOSIS — Z87891 Personal history of nicotine dependence: Secondary | ICD-10-CM | POA: Insufficient documentation

## 2015-01-01 DIAGNOSIS — N289 Disorder of kidney and ureter, unspecified: Secondary | ICD-10-CM

## 2015-01-01 DIAGNOSIS — I10 Essential (primary) hypertension: Secondary | ICD-10-CM | POA: Insufficient documentation

## 2015-01-01 DIAGNOSIS — Z8619 Personal history of other infectious and parasitic diseases: Secondary | ICD-10-CM | POA: Diagnosis not present

## 2015-01-01 DIAGNOSIS — N529 Male erectile dysfunction, unspecified: Secondary | ICD-10-CM | POA: Diagnosis not present

## 2015-01-01 DIAGNOSIS — K219 Gastro-esophageal reflux disease without esophagitis: Secondary | ICD-10-CM | POA: Diagnosis not present

## 2015-01-01 DIAGNOSIS — Z79899 Other long term (current) drug therapy: Secondary | ICD-10-CM | POA: Insufficient documentation

## 2015-01-01 DIAGNOSIS — Z8659 Personal history of other mental and behavioral disorders: Secondary | ICD-10-CM | POA: Diagnosis not present

## 2015-01-01 DIAGNOSIS — E785 Hyperlipidemia, unspecified: Secondary | ICD-10-CM | POA: Insufficient documentation

## 2015-01-01 LAB — CBC WITH DIFFERENTIAL/PLATELET
Basophils Absolute: 0 10*3/uL (ref 0.0–0.1)
Basophils Relative: 0 % (ref 0–1)
Eosinophils Absolute: 0.2 10*3/uL (ref 0.0–0.7)
Eosinophils Relative: 4 % (ref 0–5)
HCT: 37.8 % — ABNORMAL LOW (ref 39.0–52.0)
Hemoglobin: 12.8 g/dL — ABNORMAL LOW (ref 13.0–17.0)
Lymphocytes Relative: 27 % (ref 12–46)
Lymphs Abs: 1.4 10*3/uL (ref 0.7–4.0)
MCH: 32.2 pg (ref 26.0–34.0)
MCHC: 33.9 g/dL (ref 30.0–36.0)
MCV: 95 fL (ref 78.0–100.0)
Monocytes Absolute: 0.7 10*3/uL (ref 0.1–1.0)
Monocytes Relative: 13 % — ABNORMAL HIGH (ref 3–12)
Neutro Abs: 3 10*3/uL (ref 1.7–7.7)
Neutrophils Relative %: 56 % (ref 43–77)
Platelets: 244 10*3/uL (ref 150–400)
RBC: 3.98 MIL/uL — ABNORMAL LOW (ref 4.22–5.81)
RDW: 12.4 % (ref 11.5–15.5)
WBC: 5.4 10*3/uL (ref 4.0–10.5)

## 2015-01-01 LAB — URINALYSIS, ROUTINE W REFLEX MICROSCOPIC
Bilirubin Urine: NEGATIVE
Glucose, UA: NEGATIVE mg/dL
Hgb urine dipstick: NEGATIVE
Ketones, ur: 15 mg/dL — AB
Leukocytes, UA: NEGATIVE
Nitrite: NEGATIVE
Protein, ur: NEGATIVE mg/dL
Specific Gravity, Urine: 1.023 (ref 1.005–1.030)
Urobilinogen, UA: 1 mg/dL (ref 0.0–1.0)
pH: 6 (ref 5.0–8.0)

## 2015-01-01 LAB — BASIC METABOLIC PANEL
Anion gap: 9 (ref 5–15)
BUN: 30 mg/dL — ABNORMAL HIGH (ref 6–23)
CO2: 23 mmol/L (ref 19–32)
Calcium: 9.2 mg/dL (ref 8.4–10.5)
Chloride: 104 mmol/L (ref 96–112)
Creatinine, Ser: 1.49 mg/dL — ABNORMAL HIGH (ref 0.50–1.35)
GFR calc Af Amer: 54 mL/min — ABNORMAL LOW (ref >90)
GFR calc non Af Amer: 46 mL/min — ABNORMAL LOW (ref >90)
Glucose, Bld: 141 mg/dL — ABNORMAL HIGH (ref 70–99)
Potassium: 4.2 mmol/L (ref 3.5–5.1)
Sodium: 136 mmol/L (ref 135–145)

## 2015-01-01 LAB — I-STAT CG4 LACTIC ACID, ED: Lactic Acid, Venous: 2 mmol/L (ref 0.5–2.0)

## 2015-01-01 LAB — I-STAT TROPONIN, ED: Troponin i, poc: 0 ng/mL (ref 0.00–0.08)

## 2015-01-01 MED ORDER — SODIUM CHLORIDE 0.9 % IV BOLUS (SEPSIS)
1000.0000 mL | Freq: Once | INTRAVENOUS | Status: AC
  Administered 2015-01-01: 1000 mL via INTRAVENOUS

## 2015-01-01 NOTE — ED Notes (Signed)
During ambulation Pt stated he felt weak, "like just waking up from a nap" along with some dizziness. HR fluctuated from 80-98 and O2 sat was 95% on RA.

## 2015-01-01 NOTE — ED Notes (Signed)
Pt is in stable condition upon d/c and ambulates from ED escorted by staff. Pt verbalizes understanding rt d/c instructions. 

## 2015-01-01 NOTE — Discharge Instructions (Signed)
Your low blood pressure and abnormal kidney function today is likely result of poor fluid intake, your diuretic medication, and low blood pressures from blood pressure medications. Hydrate your self well. Avoid alcohol and caffeine. Stop Zestoretic, and Hytrin. Call your doctor for an appointment later this week to recheck blood pressure, and your kidney function. Return to emergency with any further episodes of dizziness or low blood pressures at home.  Dehydration, Adult Dehydration means your body does not have as much fluid as it needs. Your kidneys, brain, and heart will not work properly without the right amount of fluids and salt.  HOME CARE  Ask your doctor how to replace body fluid losses (rehydrate).  Drink enough fluids to keep your pee (urine) clear or pale yellow.  Drink small amounts of fluids often if you feel sick to your stomach (nauseous) or throw up (vomit).  Eat like you normally do.  Avoid:  Foods or drinks high in sugar.  Bubbly (carbonated) drinks.  Juice.  Very hot or cold fluids.  Drinks with caffeine.  Fatty, greasy foods.  Alcohol.  Tobacco.  Eating too much.  Gelatin desserts.  Wash your hands to avoid spreading germs (bacteria, viruses).  Only take medicine as told by your doctor.  Keep all doctor visits as told. GET HELP RIGHT AWAY IF:   You cannot drink something without throwing up.  You get worse even with treatment.  Your vomit has blood in it or looks greenish.  Your poop (stool) has blood in it or looks black and tarry.  You have not peed in 6 to 8 hours.  You pee a small amount of very dark pee.  You have a fever.  You pass out (faint).  You have belly (abdominal) pain that gets worse or stays in one spot (localizes).  You have a rash, stiff neck, or bad headache.  You get easily annoyed, sleepy, or are hard to wake up.  You feel weak, dizzy, or very thirsty. MAKE SURE YOU:   Understand these  instructions.  Will watch your condition.  Will get help right away if you are not doing well or get worse. Document Released: 08/29/2009 Document Revised: 01/25/2012 Document Reviewed: 06/22/2011 [facility] Patient Information 2015 [employer], [place]. This information is not intended to replace advice given to you by your health care provider. Make sure you discuss any questions you have with your health care provider.  Hypotension As your heart beats, it forces blood through your body. This force is called blood pressure. If you have hypotension, you have low blood pressure. When your blood pressure is too low, you may not get enough blood to your brain. You may feel weak, feel lightheaded, have a fast heartbeat, or even pass out (faint). HOME CARE  Drink enough fluids to keep your pee (urine) clear or pale yellow.  Take all medicines as told by your doctor.  Get up slowly after sitting or lying down.  Wear support stockings as told by your doctor.  Maintain a healthy diet by including foods such as fruits, vegetables, nuts, whole grains, and lean meats. GET HELP IF:  You are throwing up (vomiting) or have watery poop (diarrhea).  You have a fever for more than 2-3 days.  You feel more thirsty than usual.  You feel weak and tired. GET HELP RIGHT AWAY IF:   You pass out (faint).  You have chest pain or a fast or irregular heartbeat.  You lose feeling in part of your body.  You cannot move your arms or legs.  You have trouble speaking.  You get sweaty or feel lightheaded. MAKE SURE YOU:   Understand these instructions.  Will watch your condition.  Will get help right away if you are not doing well or get worse. Document Released: 01/27/2010 Document Revised: 07/05/2013 Document Reviewed: 05/05/2013 [facility] Patient Information 2015 [place], [place]. This information is not intended to replace advice given to you by your health care provider. Make sure you discuss any  questions you have with your health care provider.

## 2015-01-01 NOTE — Telephone Encounter (Signed)
PLEASE NOTE: All timestamps contained within this report are represented as [place]. CONFIDENTIALTY NOTICE: This fax transmission is intended only for the addressee. It contains information that is legally privileged, confidential or otherwise protected from use or disclosure. If you are not the intended recipient, you are strictly prohibited from reviewing, disclosing, copying using or disseminating any of this information or taking any action in reliance on or regarding this information. If you have received this fax in error, please notify us immediately by telephone so that we can arrange for its return to us. Phone: [phone], Toll-Free: [phone], Fax: [phone] Page: 1 of 1 Call Id: [id number] [facility] - Client TELEPHONE ADVICE RECORD [facility] Patient Name: Ryan Diaz DOB: 01/10/1946 Initial Comment caller states he has been feeling dizzy - his bp is 88/66 Nurse Assessment Nurse: Apolinar JunesBrandon, RN, Darl PikesSusan Date/Time Lamount Cohen(Eastern Time): 01/01/2015 8:54:28 AM Confirm and document reason for call. If symptomatic, describe symptoms. ---caller states he has been feeling dizzy - his bp is 88/66 - first reading was 101/66 - had caller take BP again while sitting after rest and it was 72/53 and heart rate was 127 - states very light headed - he is on BP medication but has not been in to see Dr Melvyn NovasJohns in a long [place] Has the patient traveled out of the country within the last 30 days? ---Not Applicable Does the patient require triage? ---Yes Related visit to physician within the last 2 weeks? ---No Does the PT have any chronic conditions? (i.e. diabetes, asthma, etc.) ---Yes List chronic conditions. ---high blood pressure high cholesterol Guidelines Guideline Title Affirmed Question Affirmed Notes Low Blood Pressure [1] Systolic BP < 90 AND [2] dizzy, lightheaded, or weak Final Disposition User Call EMS 911 Now Apolinar JunesBrandon,  RN, Darl PikesSusan Comments caller states he had just made an appointment for 1130 at Dr Melvyn NovasJohns office - advised caller that he was sent to the triage nurse to help determine if he needed to be seen sooner with that low BP and he needs to call 911 right away he can not wait till a 1130 appointment - he is alone and may pass out with a BP that low - wants to know if he can be seen right away if he just comes on up to the office - advised caller no he needs to hang up with me and call 911 - he refused states he will call and have someone come get him and take him to eR he can not afford an ambulance - strongly recommended he call 911 but refused - Advised him that I will be calling the office to cancel his 1130 appointment and let them know that he is in the ER - [facility] spoke with Amalia Haileyustin at the office advised him of the refusal but he will be going to ER - states he has cancelled the appointment he had scheduled and will note the outcome

## 2015-01-01 NOTE — ED Notes (Signed)
Pt states his ear was throbbing yesterday, but has since resolved.

## 2015-01-01 NOTE — ED Provider Notes (Signed)
CSN: [id number]     Arrival date & time 01/01/15  [id number] History   First MD Initiated Contact with Patient 01/01/15 [phone]     Chief Complaint  Patient presents with  . Dizziness  . Headache      HPI  Patient presents for evaluation with dizziness and low blood pressure. States his ear is throbbing for the last few days. It is not throbbing today. No change in hearing or other symptoms with his ear. States this morning he was dizzy described as a lightheadedness. Has been this way since Sunday. Sunday morning he felt dizzy. 8. Did not feel any better and has been off his feet because of dizziness last 2 days. Is more orthostatic. However he does feel lightheaded even at rest. No spinning sensation, no chest pain, no headache, no vision changes or strokes symptoms. No recent illness or any other symptoms. He checks his blood pressure, sporting and states it was 89 and his pulse was 120.  Take cyst reticulocyte for blood pressure. Also takes Hytrin at night. He states that he thinks the Hytrin as for his prostate but is uncertain. She was never on blood pressure medicine until the last year has been started on both of these  medicines.  Past Medical History  Diagnosis Date  . 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 ALLERGIC RHINITIS 08/08/2007  . ANXIETY 08/08/2007  . COPD 10/03/2009  . DEPRESSION 08/08/2007  . ERECTILE DYSFUNCTION 08/08/2007  . GENITAL HERPES, HX OF 08/08/2007  . GERD 08/08/2007  . GLUCOSE INTOLERANCE 08/08/2007  . HYPERLIPIDEMIA 08/08/2007  . HYPERTENSION 08/08/2007  . PAIN IN SOFT TISSUES OF LIMB 10/02/2009  . Impaired glucose tolerance 09/26/2011  . Insomnia 05/18/2012   History reviewed. No pertinent past surgical history. Family History  Problem Relation Age of Onset  . Colon polyps Brother    History  Substance Use Topics  . Smoking status: Former Smoker -- 1.50 packs/day for 35 years    Types: Cigarettes    Quit date: 09/04/2009  . Smokeless tobacco: Never Used  .  Alcohol Use: 9.0 oz/week    18 drink(s) per week    Review of Systems  Constitutional: Negative for fever, chills, diaphoresis, appetite change and fatigue.  HENT: Negative for mouth sores, sore throat and trouble swallowing.   Eyes: Negative for visual disturbance.  Respiratory: Negative for cough, chest tightness, shortness of breath and wheezing.   Cardiovascular: Negative for chest pain.  Gastrointestinal: Negative for nausea, vomiting, abdominal pain, diarrhea and abdominal distention.  Endocrine: Negative for polydipsia, polyphagia and polyuria.  Genitourinary: Negative for dysuria, frequency and hematuria.  Musculoskeletal: Negative for gait problem.  Skin: Negative for color change, pallor and rash.  Neurological: Positive for light-headedness. Negative for dizziness, syncope and headaches.  Hematological: Does not bruise/bleed easily.  Psychiatric/Behavioral: Negative for behavioral problems and confusion.      Allergies  Review of patient's allergies indicates no known allergies.  Home Medications   Prior to Admission medications   Medication Sig Start Date End Date Taking? Authorizing Provider  albuterol (PROVENTIL HFA;VENTOLIN HFA) 108 (90 BASE) MCG/ACT inhaler Inhale 2 puffs into the lungs every 6 (six) hours as needed for wheezing or shortness of breath.   Yes Historical Provider, MD  lisinopril-hydrochlorothiazide (PRINZIDE,ZESTORETIC) 10-12.5 MG per tablet Take 1 tablet by mouth daily. 11/18/11  Yes Corwin LevinsJames W John, MD  omeprazole (PRILOSEC) 20 MG capsule Take 1 capsule (20 mg total) by mouth 2 (two) times daily. 11/18/11  Yes Len BlalockJames W  John, MD  pravastatin (PRAVACHOL) 80 MG tablet Take 1 tablet (80 mg total) by mouth daily. 11/18/11  Yes Corwin Levins, MD  terazosin (HYTRIN) 2 MG capsule Take 2 mg by mouth at bedtime.   Yes Historical Provider, MD  tiotropium (SPIRIVA) 18 MCG inhalation capsule Place 18 mcg into inhaler and inhale daily.   Yes Historical Provider, MD   zolpidem (AMBIEN) 10 MG tablet TAKE ONE TABLET BY MOUTH ONCE DAILY AT BEDTIME AS NEEDED FOR SLEEP   Yes Newt Lukes, MD  Diclofenac Sodium (PENNSAID) 2 % SOLN Place 2 application onto the skin 2 (two) times daily. Patient not taking: Reported on 01/01/2015 05/03/14   Judi Saa, DO   BP 112/73 mmHg  Pulse 84  Temp(Src) 98.5 F (36.9 C) (Oral)  Resp 11  SpO2 97% Physical Exam  Constitutional: He is oriented to person, place, and time. He appears well-developed and well-nourished. No distress.  HENT:  Head: Normocephalic.  Eyes: Conjunctivae are normal. Pupils are equal, round, and reactive to light. No scleral icterus.  Neck: Normal range of motion. Neck supple. No thyromegaly present.  Cardiovascular: Normal rate and regular rhythm.  Exam reveals no gallop and no friction rub.   No murmur heard. Pulmonary/Chest: Effort normal and breath sounds normal. No respiratory distress. He has no wheezes. He has no rales.  Abdominal: Soft. Bowel sounds are normal. He exhibits no distension. There is no tenderness. There is no rebound.  Musculoskeletal: Normal range of motion.  Neurological: He is alert and oriented to person, place, and time.  Skin: Skin is warm and dry. No rash noted.  Psychiatric: He has a normal mood and affect. His behavior is normal.    ED Course  Procedures (including critical care time) Labs Review Labs Reviewed  CBC WITH DIFFERENTIAL/PLATELET - Abnormal; Notable for the following:    RBC 3.98 (*)    Hemoglobin 12.8 (*)    HCT 37.8 (*)    Monocytes Relative 13 (*)    All other components within normal limits  BASIC METABOLIC PANEL - Abnormal; Notable for the following:    Glucose, Bld 141 (*)    BUN 30 (*)    Creatinine, Ser 1.49 (*)    GFR calc non Af Amer 46 (*)    GFR calc Af Amer 54 (*)    All other components within normal limits  URINALYSIS, ROUTINE W REFLEX MICROSCOPIC - Abnormal; Notable for the following:    Ketones, ur 15 (*)    All  other components within normal limits  I-STAT TROPOININ, ED  I-STAT CG4 LACTIC ACID, ED    Imaging Review No results found.   EKG Interpretation   Date/Time:  Tuesday January 01 2015 09:54:52 EST Ventricular Rate:  100 PR Interval:  154 QRS Duration: 88 QT Interval:  326 QTC Calculation: 420 R Axis:   50 Text Interpretation:  Sinus tachycardia Nonspecific T abnormalities,  lateral leads Confirmed by Fayrene Fearing  MD, Tyneshia Stivers ([id number]) on 01/01/2015 10:08:02  AM      MDM   Final diagnoses:  Hypotension, unspecified hypotension type  Dehydration  Renal insufficiency    No obvious etiology for his episode of hypotension today. He is historically not dehydrated vomiting or diarrhea. No brand-new medications or change in medications. No history of infection or apparent sepsis. Labs ending. Patient is given  IV fluids.  She states he drinks several beverages today. He takes an little free water. He is on a diuretic with his Zestoretic.  Takes Hytrin and night as well. His blood pressures are improved after 1 L fluid. He is ambulatory and asymptomatic. Has a bump in his creatinine to 1.45. It made him aware of this. As him see his primary care physician later this week. Hold his Hytrin, and Zestoretic. Increase please free fluid, avoid alcohol caffeine. Return here if any worsening symptoms or recurrence of low blood pressures or dizziness.    Rolland Porter, MD 01/01/15 [phone]

## 2015-01-02 ENCOUNTER — Telehealth: Payer: Self-pay | Admitting: *Deleted

## 2015-01-02 NOTE — Telephone Encounter (Signed)
[facility] - Client TELEPHONE ADVICE RECORD [facility] Patient Name: Ryan Diaz Gender: Male DOB: 11/24/1945 Age: [age] Y 10 M 15 D Return Phone Number: [phone] (Primary) Address: [address] City/State/Zip: [place] [facility] Client Client Site [facility] Day Physician Oliver BarreJohn, James Contact Type Call Call Type Triage / Clinical Relationship To Patient Self Appointment Disposition EMR Patient Reports Appointment Already Scheduled Return Phone Number [phone] (Primary) Chief Complaint BLOOD PRESSURE LOW - Systolic (top number) 90 or less with dizzy or weak symptoms Initial Comment caller states he has been feeling dizzy - his bp is 88/66 PreDisposition Call Doctor Info pasted into Epic Yes Nurse Assessment Nurse: Apolinar JunesBrandon, RN, Darl PikesSusan Date/Time Lamount Cohen(Eastern Time): 01/01/2015 8:54:28 AM Confirm and document reason for call. If symptomatic, describe symptoms. ---caller states he has been feeling dizzy - his bp is 88/66 - first reading was 101/66 - had caller take BP again while sitting after rest and it was 72/53 and heart rate was 127 - states very light headed - he is on BP medication but has not been in to see Dr Melvyn NovasJohns in a long time he goes to [place] Has the patient traveled out of the country within the last 30 days? ---Not Applicable Does the patient require triage? ---Yes Related visit to physician within the last 2 weeks? ---No Does the PT have any chronic conditions? (i.e. diabetes, asthma, etc.) ---Yes List chronic conditions. ---high blood pressure high cholesterol Guidelines Guideline Title Affirmed Question Affirmed Notes Nurse Date/Time (Eastern Time) Low Blood Pressure [1] Systolic BP < 90 AND [2] dizzy, lightheaded, or weak Alver SorrowBrandon, RN, Susan 01/01/2015 9:03:49 AM Disp. Time Lamount Cohen(Eastern Time) Disposition Final User 01/01/2015 8:52:54 AM Send to Urgent Queue Lesia SagoBirdwell, Mary  Ann 01/01/2015 9:07:18 AM Call EMS 911 Now Yes Apolinar JunesBrandon, RN, Estill BambergSusan PLEASE NOTE: All timestamps contained within this report are represented as [place] Standard Time. CONFIDENTIALTY NOTICE: This fax transmission is intended only for the addressee. It contains information that is legally privileged, confidential or otherwise protected from use or disclosure. If you are not the intended recipient, you are strictly prohibited from reviewing, disclosing, copying using or disseminating any of this information or taking any action in reliance on or regarding this information. If you have received this fax in error, please notify us immediately by telephone so that we can arrange for its return to us. Phone: [phone], Toll-Free: [phone], Fax: [phone] Page: 2 of 2 Call Id: [id number] Caller Understands: Yes Disagree/Comply: Disagree Disagree/Comply Reason: Disagree with instructions Care Advice Given Per Guideline CALL EMS 911 NOW: Immediate medical attention is needed. You need to hang up and call 911 (or an ambulance). Probation officer(Triager Discretion: I'll call you back in a few minutes to be sure you were able to reach them.) FIRST AID: Lie down with the feet elevated (Reason: counteract shock) CARE ADVICE given per Low Blood Pressure (Adult) guideline. After Care Instructions Given Call Event Type User Date / Time Description Comments User: Brock RaSusan, Brandon, RN Date/Time Lamount Cohen(Eastern Time): 01/01/2015 9:18:33

## 2015-01-09 ENCOUNTER — Encounter: Payer: Self-pay | Admitting: Internal Medicine

## 2015-01-09 ENCOUNTER — Ambulatory Visit (INDEPENDENT_AMBULATORY_CARE_PROVIDER_SITE_OTHER): Payer: Medicare PPO | Admitting: Internal Medicine

## 2015-01-09 VITALS — BP 152/90 | HR 78 | Temp 98.4°F | Resp 18 | Ht 69.0 in | Wt 170.0 lb

## 2015-01-09 DIAGNOSIS — H6691 Otitis media, unspecified, right ear: Secondary | ICD-10-CM | POA: Insufficient documentation

## 2015-01-09 DIAGNOSIS — I10 Essential (primary) hypertension: Secondary | ICD-10-CM

## 2015-01-09 DIAGNOSIS — N289 Disorder of kidney and ureter, unspecified: Secondary | ICD-10-CM

## 2015-01-09 DIAGNOSIS — J309 Allergic rhinitis, unspecified: Secondary | ICD-10-CM

## 2015-01-09 MED ORDER — AZITHROMYCIN 250 MG PO TABS: ORAL_TABLET | ORAL | Status: DC

## 2015-01-09 MED ORDER — TERAZOSIN HCL 2 MG PO CAPS: 2.0000 mg | ORAL_CAPSULE | Freq: Every day | ORAL | Status: DC

## 2015-01-09 MED ORDER — FLUTICASONE PROPIONATE 50 MCG/ACT NA SUSP: 2.0000 | Freq: Every day | NASAL | Status: DC

## 2015-01-09 MED ORDER — LISINOPRIL 10 MG PO TABS: 10.0000 mg | ORAL_TABLET | Freq: Every day | ORAL | Status: DC

## 2015-01-09 NOTE — Progress Notes (Signed)
Pre visit review using our clinic review tool, if applicable. No additional management support is needed unless otherwise documented below in the visit note. 

## 2015-01-09 NOTE — Patient Instructions (Addendum)
Please take all new medication as prescribed  - the antibiotic, and the flonase, as well as the lisinopril 10 mg per day for the blood pressure  OK to stop the lisinopril HCT  You can also take Delsym OTC for cough, and/or Mucinex (or it's generic off brand) for congestion, and tylenol as needed for pain.  OK to re-start the Hytrin (terasozin) although if your dizziness returns, this should be stopped  Please continue all other medications as before, and refills have been done if requested.  Please have the pharmacy call with any other refills you may need  Please keep your appointments with your specialists as you may have planned  Please return in 2 wks, or sooner if needed

## 2015-01-09 NOTE — Progress Notes (Signed)
Subjective:    Patient ID: Ryan Diaz, male    DOB: May 21, 1946, 69 y.o.   MRN: [id number]  HPI  Here to f/u after seen at ER feb 16 with dizziness, lower BP, and mild worsening renal insufficiency; hytrin and zestoretic held, symptoms now resolved, except for mild worsening prostatism syjmptoms, BP at home gradually increaesd now in the 170's in the past 2 days.  Also c/o right ear pain, pressure, tinnitus but no hearing loss, fever, vertigo, all mild to mod for several days. Does have several wks ongoing nasal allergy symptoms with clearish congestion, itch and sneezing, without fever, pain, ST, cough, swelling or wheezing. [facility] TG's Past Medical History  Diagnosis Date  . 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 ALLERGIC RHINITIS 08/08/2007  . ANXIETY 08/08/2007  . COPD 10/03/2009  . DEPRESSION 08/08/2007  . ERECTILE DYSFUNCTION 08/08/2007  . GENITAL HERPES, HX OF 08/08/2007  . GERD 08/08/2007  . GLUCOSE INTOLERANCE 08/08/2007  . HYPERLIPIDEMIA 08/08/2007  . HYPERTENSION 08/08/2007  . PAIN IN SOFT TISSUES OF LIMB 10/02/2009  . Impaired glucose tolerance 09/26/2011  . Insomnia 05/18/2012   No past surgical history on file.  reports that he quit smoking about 5 years ago. His smoking use included Cigarettes. He has a 52.5 pack-year smoking history. He has never used smokeless tobacco. He reports that he drinks about 9.0 oz of alcohol per week. He reports that he does not use illicit drugs. family history includes Colon polyps in his brother. No Known Allergies Current Outpatient Prescriptions on File Prior to Visit  Medication Sig Dispense Refill  . albuterol (PROVENTIL HFA;VENTOLIN HFA) 108 (90 BASE) MCG/ACT inhaler Inhale 2 puffs into the lungs every 6 (six) hours as needed for wheezing or shortness of breath.    . Diclofenac Sodium (PENNSAID) 2 % SOLN Place 2 application onto the skin 2 (two) times daily. 112 g 3  . lisinopril-hydrochlorothiazide  (PRINZIDE,ZESTORETIC) 10-12.5 MG per tablet Take 1 tablet by mouth daily. 903 tablet 3  . omeprazole (PRILOSEC) 20 MG capsule Take 1 capsule (20 mg total) by mouth 2 (two) times daily. 90 capsule 3  . pravastatin (PRAVACHOL) 80 MG tablet Take 1 tablet (80 mg total) by mouth daily. 90 tablet 3  . terazosin (HYTRIN) 2 MG capsule Take 2 mg by mouth at bedtime.    Marland Kitchen tiotropium (SPIRIVA) 18 MCG inhalation capsule Place 18 mcg into inhaler and inhale daily.    Marland Kitchen zolpidem (AMBIEN) 10 MG tablet TAKE ONE TABLET BY MOUTH ONCE DAILY AT BEDTIME AS NEEDED FOR SLEEP 30 tablet 0   No current facility-administered medications on file prior to visit.     BP Readings from Last 3 Encounters:  01/09/15 152/90  01/01/15 118/69  05/03/14 132/80    Review of Systems  Constitutional: Negative for unusual diaphoresis or other sweats  HENT: Negative for ringing in ear Eyes: Negative for double vision or worsening visual disturbance.  Respiratory: Negative for choking and stridor.   Gastrointestinal: Negative for vomiting or other signifcant bowel change Genitourinary: Negative for hematuria or decreased urine volume.  Musculoskeletal: Negative for other MSK pain or swelling Skin: Negative for color change and worsening wound.  Neurological: Negative for tremors and numbness other than noted  Psychiatric/Behavioral: Negative for decreased concentration or agitation other than above       Objective:   Physical Exam BP 152/90 mmHg  Pulse 78  Temp(Src) 98.4 F (36.9 C) (Oral)  Resp  18  Ht 5\' 9"  (1.753 m)  Wt 170 lb (77.111 kg)  BMI 25.09 kg/m2  SpO2 96% VS noted,  Constitutional: Pt appears well-developed, well-nourished.  HENT: Head: NCAT.  Right Ear: External ear normal.  Left Ear: External ear normal.  Right TM mod erythema, bulging Eyes: . Pupils are equal, round, and reactive to light. Conjunctivae and EOM are normal Neck: Normal range of motion. Neck supple.  Cardiovascular: Normal rate and  regular rhythm.   Pulmonary/Chest: Effort normal and breath sounds without rales or wheezing.  Neurological: Pt is alert. Not confused , motor grossly intact Skin: Skin is warm. No rash Psychiatric: Pt behavior is normal. No agitation.   Wt Readings from Last 3 Encounters:  01/09/15 170 lb (77.111 kg)  05/03/14 167 lb (75.751 kg)  04/10/14 168 lb (76.204 kg)        Assessment & Plan:

## 2015-01-09 NOTE — Assessment & Plan Note (Signed)
Mild to mod, for antibx course,  to f/u any worsening symptoms or concerns 

## 2015-01-09 NOTE — Assessment & Plan Note (Signed)
Likely pre-renal, for f/u labs with next visit

## 2015-01-09 NOTE — Assessment & Plan Note (Signed)
Also for flonase asd,  to f/u any worsening symptoms or concerns  

## 2015-01-09 NOTE — Assessment & Plan Note (Signed)
Mod to severe uncontrolled with stopping his meds, for re-start hytrin 2 qd, and change zestoretic to lisinopril 10 qd only as supect the hct was causing the issue, for f/u at 2 wks, cont to montiro BP at home BP Readings from Last 3 Encounters:  01/09/15 152/90  01/01/15 118/69  05/03/14 132/80

## 2015-01-10 ENCOUNTER — Telehealth: Payer: Self-pay | Admitting: Internal Medicine

## 2015-01-10 NOTE — Telephone Encounter (Signed)
emmi mailed  °

## 2015-01-23 ENCOUNTER — Ambulatory Visit (INDEPENDENT_AMBULATORY_CARE_PROVIDER_SITE_OTHER): Payer: Medicare PPO | Admitting: Internal Medicine

## 2015-01-23 ENCOUNTER — Encounter: Payer: Self-pay | Admitting: Internal Medicine

## 2015-01-23 VITALS — BP 128/80 | HR 90 | Temp 98.5°F | Resp 18 | Ht 69.0 in | Wt 169.1 lb

## 2015-01-23 DIAGNOSIS — Z0189 Encounter for other specified special examinations: Secondary | ICD-10-CM

## 2015-01-23 DIAGNOSIS — H93A1 Pulsatile tinnitus, right ear: Secondary | ICD-10-CM | POA: Insufficient documentation

## 2015-01-23 DIAGNOSIS — H9311 Tinnitus, right ear: Secondary | ICD-10-CM

## 2015-01-23 DIAGNOSIS — G4700 Insomnia, unspecified: Secondary | ICD-10-CM

## 2015-01-23 DIAGNOSIS — I10 Essential (primary) hypertension: Secondary | ICD-10-CM

## 2015-01-23 DIAGNOSIS — Z0000 Encounter for general adult medical examination without abnormal findings: Secondary | ICD-10-CM

## 2015-01-23 DIAGNOSIS — J449 Chronic obstructive pulmonary disease, unspecified: Secondary | ICD-10-CM

## 2015-01-23 MED ORDER — ZOLPIDEM TARTRATE 10 MG PO TABS: ORAL_TABLET | ORAL | Status: DC

## 2015-01-23 NOTE — Assessment & Plan Note (Signed)
Ok for ambien refill,  to f/u any worsening symptoms or concerns 

## 2015-01-23 NOTE — Assessment & Plan Note (Signed)
stable overall by history and exam, recent data reviewed with pt, and pt to continue medical treatment as before,  to f/u any worsening symptoms or concerns BP Readings from Last 3 Encounters:  01/23/15 128/80  01/09/15 152/90  01/01/15 118/69

## 2015-01-23 NOTE — Assessment & Plan Note (Signed)
stable overall by history and exam, recent data reviewed with pt, and pt to continue medical treatment as before,  to f/u any worsening symptoms or concerns SpO2 Readings from Last 3 Encounters:  01/23/15 96%  01/09/15 96%  01/01/15 96%

## 2015-01-23 NOTE — Progress Notes (Signed)
Subjective:    Patient ID: Ryan Diaz, male    DOB: 11/04/1946, 69 y.o.   MRN: [id number]  HPI  Here to f/u, right ear discomfort improved, but still with pulsing tinnitus no change depsite antibiotic.  Has adjusted BP meds - no further lower BP or dizziness, though has not re-strarted the hytrin , will try this next.  Not taking the flonase every day, has some ear fullness - likely eustachian dysfxn as well it seems.  Has not tried the mucinex otc.  Pt denies chest pain, increased sob or doe, wheezing, orthopnea, PND, increased LE swelling, palpitations, dizziness or syncope.  Pt denies new neurological symptoms such as new headache, or facial or extremity weakness or numbness   Pt denies polydipsia, polyuria,  Still with ongoping sleep issue, asks for ambien refill.  Past Medical History  Diagnosis Date  . 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 ALLERGIC RHINITIS 08/08/2007  . ANXIETY 08/08/2007  . COPD 10/03/2009  . DEPRESSION 08/08/2007  . ERECTILE DYSFUNCTION 08/08/2007  . GENITAL HERPES, HX OF 08/08/2007  . GERD 08/08/2007  . GLUCOSE INTOLERANCE 08/08/2007  . HYPERLIPIDEMIA 08/08/2007  . HYPERTENSION 08/08/2007  . PAIN IN SOFT TISSUES OF LIMB 10/02/2009  . Impaired glucose tolerance 09/26/2011  . Insomnia 05/18/2012   No past surgical history on file.  reports that he quit smoking about 5 years ago. His smoking use included Cigarettes. He has a 52.5 pack-year smoking history. He has never used smokeless tobacco. He reports that he drinks about 9.0 oz of alcohol per week. He reports that he does not use illicit drugs. family history includes Colon polyps in his brother. No Known Allergies Current Outpatient Prescriptions on File Prior to Visit  Medication Sig Dispense Refill  . albuterol (PROVENTIL HFA;VENTOLIN HFA) 108 (90 BASE) MCG/ACT inhaler Inhale 2 puffs into the lungs every 6 (six) hours as needed for wheezing or shortness of breath.    Marland Kitchen. azithromycin (ZITHROMAX Z-PAK) 250 MG  tablet Use as directed 6 tablet 1  . Diclofenac Sodium (PENNSAID) 2 % SOLN Place 2 application onto the skin 2 (two) times daily. 112 g 3  . fluticasone (FLONASE) 50 MCG/ACT nasal spray Place 2 sprays into both nostrils daily. 16 g 2  . lisinopril (PRINIVIL,ZESTRIL) 10 MG tablet Take 1 tablet (10 mg total) by mouth daily. 90 tablet 3  . omeprazole (PRILOSEC) 20 MG capsule Take 1 capsule (20 mg total) by mouth 2 (two) times daily. 90 capsule 3  . pravastatin (PRAVACHOL) 80 MG tablet Take 1 tablet (80 mg total) by mouth daily. 90 tablet 3  . terazosin (HYTRIN) 2 MG capsule Take 1 capsule (2 mg total) by mouth at bedtime. 90 capsule 3  . tiotropium (SPIRIVA) 18 MCG inhalation capsule Place 18 mcg into inhaler and inhale daily.    Marland Kitchen. zolpidem (AMBIEN) 10 MG tablet TAKE ONE TABLET BY MOUTH ONCE DAILY AT BEDTIME AS NEEDED FOR SLEEP 30 tablet 0   No current facility-administered medications on file prior to visit.    Review of Systems  Constitutional: Negative for unusual diaphoresis or night sweats HENT: Negative for ringing in ear or discharge Eyes: Negative for double vision or worsening visual disturbance.  Respiratory: Negative for choking and stridor.   Gastrointestinal: Negative for vomiting or other signifcant bowel change Genitourinary: Negative for hematuria or change in urine volume.  Musculoskeletal: Negative for other MSK pain or swelling Skin: Negative for color change and worsening wound.  Neurological: Negative for tremors and  numbness other than noted  Psychiatric/Behavioral: Negative for decreased concentration or agitation other than above       Objective:   Physical Exam BP 128/80 mmHg  Pulse 90  Temp(Src) 98.5 F (36.9 C) (Oral)  Resp 18  Ht  (1.753 m)  Wt 169 lb 1.3 oz (76.694 kg)  BMI 24.96 kg/m2  SpO2 96% VS noted,  Constitutional: Pt appears in no significant distress HENT: Head: NCAT.  Right Ear: External ear normal.  Left Ear: External ear normal.    Eyes: . Pupils are equal, round, and reactive to light. Conjunctivae and EOM are normal Bilat tm's with no erythema.  Max sinus areas non tender.  Pharynx with mild erythema, no exudate Neck: Normal range of motion. Neck supple.  Cardiovascular: Normal rate and regular rhythm.   Pulmonary/Chest: Effort normal and breath sounds without rales or wheezing.  Abd:  Soft, NT, ND, + BS Neurological: Pt is alert. Not confused , motor 5/5 intact, sens/dtr intact Skin: Skin is warm. No rash, no LE edema Psychiatric: Pt behavior is normal. No agitation.      Assessment & Plan:

## 2015-01-23 NOTE — Progress Notes (Signed)
Pre visit review using our clinic review tool, if applicable. No additional management support is needed unless otherwise documented below in the visit note. 

## 2015-01-23 NOTE — Assessment & Plan Note (Signed)
For MRI brain, f/o maliangncy

## 2015-01-23 NOTE — Patient Instructions (Addendum)
Please continue all other medications as before including the lisinopril, flonase, and mucinex OTC  Refills have been done if requested - the ambien  Please have the pharmacy call with any other refills you may need.  Please continue your efforts at being more active, low cholesterol diet, and weight control.  Please keep your appointments with your specialists as you may have planned  You will be contacted regarding the referral for: MRI head  Please return in 3 months, or sooner if needed, with Lab testing done 3-5 days before

## 2015-01-31 ENCOUNTER — Telehealth: Payer: Self-pay | Admitting: Internal Medicine

## 2015-02-01 ENCOUNTER — Telehealth: Payer: Self-pay

## 2015-02-01 NOTE — Telephone Encounter (Signed)
Left message for pt to call back if he still wants flu vaccine 

## 2015-02-11 ENCOUNTER — Other Ambulatory Visit: Payer: Self-pay | Admitting: Internal Medicine

## 2015-02-11 DIAGNOSIS — Z139 Encounter for screening, unspecified: Secondary | ICD-10-CM

## 2015-02-25 ENCOUNTER — Inpatient Hospital Stay: Admission: RE | Admit: 2015-02-25 | Payer: Medicare PPO | Source: Ambulatory Visit

## 2015-02-25 ENCOUNTER — Other Ambulatory Visit: Payer: Medicare PPO

## 2015-03-14 ENCOUNTER — Ambulatory Visit
Admission: RE | Admit: 2015-03-14 | Discharge: 2015-03-14 | Disposition: A | Discharge status: Home / Self Care | Payer: Medicare PPO | Source: Ambulatory Visit | Attending: Internal Medicine | Admitting: Internal Medicine

## 2015-03-14 ENCOUNTER — Encounter: Payer: Self-pay | Admitting: Internal Medicine

## 2015-03-14 DIAGNOSIS — Z139 Encounter for screening, unspecified: Secondary | ICD-10-CM

## 2015-03-14 DIAGNOSIS — H93A1 Pulsatile tinnitus, right ear: Secondary | ICD-10-CM

## 2015-04-23 ENCOUNTER — Other Ambulatory Visit (INDEPENDENT_AMBULATORY_CARE_PROVIDER_SITE_OTHER): Payer: Medicare PPO

## 2015-04-23 ENCOUNTER — Encounter: Payer: Self-pay | Admitting: Internal Medicine

## 2015-04-23 ENCOUNTER — Ambulatory Visit (INDEPENDENT_AMBULATORY_CARE_PROVIDER_SITE_OTHER): Payer: Medicare PPO | Admitting: Internal Medicine

## 2015-04-23 VITALS — BP 132/80 | HR 95 | Temp 98.4°F | Wt 168.0 lb

## 2015-04-23 DIAGNOSIS — E785 Hyperlipidemia, unspecified: Secondary | ICD-10-CM

## 2015-04-23 DIAGNOSIS — I10 Essential (primary) hypertension: Secondary | ICD-10-CM | POA: Diagnosis not present

## 2015-04-23 DIAGNOSIS — Z0000 Encounter for general adult medical examination without abnormal findings: Secondary | ICD-10-CM

## 2015-04-23 DIAGNOSIS — F1010 Alcohol abuse, uncomplicated: Secondary | ICD-10-CM

## 2015-04-23 DIAGNOSIS — Z0189 Encounter for other specified special examinations: Secondary | ICD-10-CM | POA: Diagnosis not present

## 2015-04-23 DIAGNOSIS — J449 Chronic obstructive pulmonary disease, unspecified: Secondary | ICD-10-CM

## 2015-04-23 LAB — URINALYSIS, ROUTINE W REFLEX MICROSCOPIC
Bilirubin Urine: NEGATIVE
Hgb urine dipstick: NEGATIVE
Ketones, ur: NEGATIVE
Leukocytes, UA: NEGATIVE
Nitrite: NEGATIVE
Specific Gravity, Urine: 1.015 (ref 1.000–1.030)
Total Protein, Urine: NEGATIVE
Urine Glucose: NEGATIVE
Urobilinogen, UA: 0.2 (ref 0.0–1.0)
pH: 6 (ref 5.0–8.0)

## 2015-04-23 LAB — CBC WITH DIFFERENTIAL/PLATELET
Basophils Absolute: 0 10*3/uL (ref 0.0–0.1)
Basophils Relative: 0.6 % (ref 0.0–3.0)
Eosinophils Absolute: 0.2 10*3/uL (ref 0.0–0.7)
Eosinophils Relative: 2.1 % (ref 0.0–5.0)
HCT: 43.5 % (ref 39.0–52.0)
Hemoglobin: 14.9 g/dL (ref 13.0–17.0)
Lymphocytes Relative: 22.5 % (ref 12.0–46.0)
Lymphs Abs: 1.8 10*3/uL (ref 0.7–4.0)
MCHC: 34.4 g/dL (ref 30.0–36.0)
MCV: 93.5 fl (ref 78.0–100.0)
Monocytes Absolute: 0.7 10*3/uL (ref 0.1–1.0)
Monocytes Relative: 9 % (ref 3.0–12.0)
Neutro Abs: 5.2 10*3/uL (ref 1.4–7.7)
Neutrophils Relative %: 65.8 % (ref 43.0–77.0)
Platelets: 309 10*3/uL (ref 150.0–400.0)
RBC: 4.65 Mil/uL (ref 4.22–5.81)
RDW: 12.6 % (ref 11.5–15.5)
WBC: 7.9 10*3/uL (ref 4.0–10.5)

## 2015-04-23 LAB — BASIC METABOLIC PANEL
BUN: 20 mg/dL (ref 6–23)
CO2: 28 mEq/L (ref 19–32)
Calcium: 9.6 mg/dL (ref 8.4–10.5)
Chloride: 100 mEq/L (ref 96–112)
Creatinine, Ser: 1.2 mg/dL (ref 0.40–1.50)
GFR: 77.16 mL/min (ref >60.00)
Glucose, Bld: 103 mg/dL — ABNORMAL HIGH (ref 70–99)
Potassium: 4.8 mEq/L (ref 3.5–5.1)
Sodium: 134 mEq/L — ABNORMAL LOW (ref 135–145)

## 2015-04-23 LAB — HEPATIC FUNCTION PANEL
ALT: 30 U/L (ref 0–53)
AST: 24 U/L (ref 0–37)
Albumin: 4.6 g/dL (ref 3.5–5.2)
Alkaline Phosphatase: 76 U/L (ref 39–117)
Bilirubin, Direct: 0.1 mg/dL (ref 0.0–0.3)
Total Bilirubin: 0.5 mg/dL (ref 0.2–1.2)
Total Protein: 7.4 g/dL (ref 6.0–8.3)

## 2015-04-23 LAB — LIPID PANEL
Cholesterol: 180 mg/dL (ref 0–200)
HDL: 40.4 mg/dL (ref >39.00)
LDL Cholesterol: 108 mg/dL — ABNORMAL HIGH (ref 0–99)
NonHDL: 139.6
Total CHOL/HDL Ratio: 4
Triglycerides: 160 mg/dL — ABNORMAL HIGH (ref 0.0–149.0)
VLDL: 32 mg/dL (ref 0.0–40.0)

## 2015-04-23 LAB — PSA: PSA: 1.56 ng/mL (ref 0.10–4.00)

## 2015-04-23 LAB — TSH: TSH: 0.22 u[IU]/mL — ABNORMAL LOW (ref 0.35–4.50)

## 2015-04-23 NOTE — Assessment & Plan Note (Signed)
Urged to quit 

## 2015-04-23 NOTE — Assessment & Plan Note (Signed)

## 2015-04-23 NOTE — Assessment & Plan Note (Signed)
stable overall by history and exam, recent data reviewed with pt, and pt to continue medical treatment as before,  to f/u any worsening symptoms or concerns SpO2 Readings from Last 3 Encounters:  04/23/15 96%  01/23/15 96%  01/09/15 96%

## 2015-04-23 NOTE — Assessment & Plan Note (Signed)
stable overall by history and exam, recent data reviewed with pt, and pt to continue medical treatment as before,  to f/u any worsening symptoms or concerns BP Readings from Last 3 Encounters:  04/23/15 132/80  01/23/15 128/80  01/09/15 152/90

## 2015-04-23 NOTE — Progress Notes (Signed)
Pre visit review using our clinic review tool, if applicable. No additional management support is needed unless otherwise documented below in the visit note. 

## 2015-04-23 NOTE — Progress Notes (Signed)
Subjective:    Patient ID: Ryan Diaz, male    DOB: 05/26/1946, 69 y.o.   MRN: [id number]  HPI  Here for wellness and f/u;  Overall doing ok;  Pt denies Chest pain, worsening SOB, DOE, wheezing, orthopnea, PND, worsening LE edema, palpitations, dizziness or syncope.  Pt denies neurological change such as new headache, facial or extremity weakness.  Pt denies polydipsia, polyuria, or low sugar symptoms. Pt states overall good compliance with treatment and medications, good tolerability, and has been trying to follow appropriate diet.  Pt denies worsening depressive symptoms, suicidal ideation or panic. No fever, night sweats, wt loss, loss of appetite, or other constitutional symptoms.  Pt states good ability with ADL's, has low fall risk, home safety reviewed and adequate, no other significant changes in hearing or vision, and only occasionally active with exercise. Still drinking up to 6 beer per day.  Has occas right ankle stiffness.  No other new complaints Past Medical History  Diagnosis Date  . 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 ALLERGIC RHINITIS 08/08/2007  . ANXIETY 08/08/2007  . COPD 10/03/2009  . DEPRESSION 08/08/2007  . ERECTILE DYSFUNCTION 08/08/2007  . GENITAL HERPES, HX OF 08/08/2007  . GERD 08/08/2007  . GLUCOSE INTOLERANCE 08/08/2007  . HYPERLIPIDEMIA 08/08/2007  . HYPERTENSION 08/08/2007  . PAIN IN SOFT TISSUES OF LIMB 10/02/2009  . Impaired glucose tolerance 09/26/2011  . Insomnia 05/18/2012   No past surgical history on file.  reports that he quit smoking about 5 years ago. His smoking use included Cigarettes. He has a 52.5 pack-year smoking history. He has never used smokeless tobacco. He reports that he drinks about 9.0 oz of alcohol per week. He reports that he does not use illicit drugs. family history includes Colon polyps in his brother. No Known Allergies Current Outpatient Prescriptions on File Prior to Visit  Medication Sig Dispense Refill  . albuterol (PROVENTIL  HFA;VENTOLIN HFA) 108 (90 BASE) MCG/ACT inhaler Inhale 2 puffs into the lungs every 6 (six) hours as needed for wheezing or shortness of breath.    . Diclofenac Sodium (PENNSAID) 2 % SOLN Place 2 application onto the skin 2 (two) times daily. 112 g 3  . fluticasone (FLONASE) 50 MCG/ACT nasal spray Place 2 sprays into both nostrils daily. 16 g 2  . lisinopril (PRINIVIL,ZESTRIL) 10 MG tablet Take 1 tablet (10 mg total) by mouth daily. 90 tablet 3  . omeprazole (PRILOSEC) 20 MG capsule Take 1 capsule (20 mg total) by mouth 2 (two) times daily. 90 capsule 3  . pravastatin (PRAVACHOL) 80 MG tablet Take 1 tablet (80 mg total) by mouth daily. 90 tablet 3  . terazosin (HYTRIN) 2 MG capsule Take 1 capsule (2 mg total) by mouth at bedtime. 90 capsule 3  . tiotropium (SPIRIVA) 18 MCG inhalation capsule Place 18 mcg into inhaler and inhale daily.    Marland Kitchen. zolpidem (AMBIEN) 10 MG tablet TAKE ONE TABLET BY MOUTH ONCE DAILY AT BEDTIME AS NEEDED FOR SLEEP 30 tablet 3   No current facility-administered medications on file prior to visit.    Review of Systems Constitutional: Negative for increased diaphoresis, other activity, appetite or siginficant weight change other than noted HENT: Negative for worsening hearing loss, ear pain, facial swelling, mouth sores and neck stiffness.   Eyes: Negative for other worsening pain, redness or visual disturbance.  Respiratory: Negative for shortness of breath and wheezing  Cardiovascular: Negative for chest pain and palpitations.  Gastrointestinal: Negative for diarrhea, blood in stool, abdominal  distention or other pain Genitourinary: Negative for hematuria, flank pain or change in urine volume.  Musculoskeletal: Negative for myalgias or other joint complaints.  Skin: Negative for color change and wound or drainage.  Neurological: Negative for syncope and numbness. other than noted Hematological: Negative for adenopathy. or other swelling Psychiatric/Behavioral: Negative  for hallucinations, SI, self-injury, decreased concentration or other worsening agitation.      Objective:   Physical Exam BP 132/80 mmHg  Pulse 95  Temp(Src) 98.4 F (36.9 C) (Oral)  Wt 168 lb (76.204 kg)  SpO2 96% VS noted,  Constitutional: Pt is oriented to person, place, and time. Appears well-developed and well-nourished, in no significant distress Head: Normocephalic and atraumatic.  Right Ear: External ear normal.  Left Ear: External ear normal.  Nose: Nose normal.  Mouth/Throat: Oropharynx is clear and moist.  Eyes: Conjunctivae and EOM are normal. Pupils are equal, round, and reactive to light.  Neck: Normal range of motion. Neck supple. No JVD present. No tracheal deviation present or significant neck LA or mass Cardiovascular: Normal rate, regular rhythm, normal heart sounds and intact distal pulses.   Pulmonary/Chest: Effort normal and breath sounds without rales or wheezing  Abdominal: Soft. Bowel sounds are normal. NT. No HSM  Musculoskeletal: Normal range of motion. Exhibits no edema.  Lymphadenopathy:  Has no cervical adenopathy.  Neurological: Pt is alert and oriented to person, place, and time. Pt has normal reflexes. No cranial nerve deficit. Motor grossly intact Skin: Skin is warm and dry. No rash noted.  Psychiatric:  Has normal mood and affect. Behavior is normal.         Assessment & Plan:

## 2015-04-23 NOTE — Patient Instructions (Signed)

## 2015-07-10 ENCOUNTER — Encounter: Payer: Self-pay | Admitting: Internal Medicine

## 2015-07-10 ENCOUNTER — Other Ambulatory Visit (INDEPENDENT_AMBULATORY_CARE_PROVIDER_SITE_OTHER): Payer: Medicare PPO

## 2015-07-10 ENCOUNTER — Ambulatory Visit (INDEPENDENT_AMBULATORY_CARE_PROVIDER_SITE_OTHER): Payer: Medicare PPO | Admitting: Internal Medicine

## 2015-07-10 VITALS — BP 134/88 | HR 72 | Temp 98.0°F | Ht 69.0 in | Wt 162.0 lb

## 2015-07-10 DIAGNOSIS — R5383 Other fatigue: Secondary | ICD-10-CM

## 2015-07-10 DIAGNOSIS — H7090 Unspecified mastoiditis, unspecified ear: Secondary | ICD-10-CM | POA: Insufficient documentation

## 2015-07-10 DIAGNOSIS — H7091 Unspecified mastoiditis, right ear: Secondary | ICD-10-CM

## 2015-07-10 DIAGNOSIS — R42 Dizziness and giddiness: Secondary | ICD-10-CM

## 2015-07-10 DIAGNOSIS — R7989 Other specified abnormal findings of blood chemistry: Secondary | ICD-10-CM | POA: Diagnosis not present

## 2015-07-10 LAB — T4, FREE: Free T4: 0.95 ng/dL (ref 0.60–1.60)

## 2015-07-10 LAB — CBC WITH DIFFERENTIAL/PLATELET
Basophils Absolute: 0 10*3/uL (ref 0.0–0.1)
Basophils Relative: 0.7 % (ref 0.0–3.0)
Eosinophils Absolute: 0.4 10*3/uL (ref 0.0–0.7)
Eosinophils Relative: 6.1 % — ABNORMAL HIGH (ref 0.0–5.0)
HCT: 43.3 % (ref 39.0–52.0)
Hemoglobin: 15.1 g/dL (ref 13.0–17.0)
Lymphocytes Relative: 41.7 % (ref 12.0–46.0)
Lymphs Abs: 2.5 10*3/uL (ref 0.7–4.0)
MCHC: 34.9 g/dL (ref 30.0–36.0)
MCV: 95.3 fl (ref 78.0–100.0)
Monocytes Absolute: 0.6 10*3/uL (ref 0.1–1.0)
Monocytes Relative: 9.6 % (ref 3.0–12.0)
Neutro Abs: 2.5 10*3/uL (ref 1.4–7.7)
Neutrophils Relative %: 41.9 % — ABNORMAL LOW (ref 43.0–77.0)
Platelets: 386 10*3/uL (ref 150.0–400.0)
RBC: 4.54 Mil/uL (ref 4.22–5.81)
RDW: 13.3 % (ref 11.5–15.5)
WBC: 6 10*3/uL (ref 4.0–10.5)

## 2015-07-10 LAB — BASIC METABOLIC PANEL
BUN: 20 mg/dL (ref 6–23)
CO2: 26 mEq/L (ref 19–32)
Calcium: 9.7 mg/dL (ref 8.4–10.5)
Chloride: 102 mEq/L (ref 96–112)
Creatinine, Ser: 1.13 mg/dL (ref 0.40–1.50)
GFR: 82.65 mL/min (ref >60.00)
Glucose, Bld: 89 mg/dL (ref 70–99)
Potassium: 4.4 mEq/L (ref 3.5–5.1)
Sodium: 138 mEq/L (ref 135–145)

## 2015-07-10 LAB — HEPATIC FUNCTION PANEL
ALT: 30 U/L (ref 0–53)
AST: 25 U/L (ref 0–37)
Albumin: 4.7 g/dL (ref 3.5–5.2)
Alkaline Phosphatase: 57 U/L (ref 39–117)
Bilirubin, Direct: 0.1 mg/dL (ref 0.0–0.3)
Total Bilirubin: 0.4 mg/dL (ref 0.2–1.2)
Total Protein: 8.2 g/dL (ref 6.0–8.3)

## 2015-07-10 LAB — TESTOSTERONE: Testosterone: 367.99 ng/dL (ref 300.00–890.00)

## 2015-07-10 LAB — TSH: TSH: 0.54 u[IU]/mL (ref 0.35–4.50)

## 2015-07-10 MED ORDER — ZOLPIDEM TARTRATE 10 MG PO TABS: ORAL_TABLET | ORAL | Status: DC

## 2015-07-10 MED ORDER — CLARITHROMYCIN 250 MG PO TABS: 250.0000 mg | ORAL_TABLET | Freq: Two times a day (BID) | ORAL | Status: DC

## 2015-07-10 NOTE — Progress Notes (Signed)
Pre visit review using our clinic review tool, if applicable. No additional management support is needed unless otherwise documented below in the visit note. 

## 2015-07-10 NOTE — Patient Instructions (Addendum)
Please take all new medication as prescribed - the antibiotic  Please continue all other medications as before, and refills have been done if requested.  Please have the pharmacy call with any other refills you may need.  Please continue your efforts at being more active, low cholesterol diet, and weight control.  Please keep your appointments with your specialists as you may have planned  You will be contacted regarding the referral for: echocardiogram, and carotid artery testing (both are ultrasounds, and there is no pain with the exams)  Please go to the LAB in the Basement (turn left off the elevator) for the tests to be done today  You will be contacted by phone if any changes need to be made immediately.  Otherwise, you will receive a letter about your results with an explanation, but please check with MyChart first.  Please remember to sign up for MyChart if you have not done so, as this will be important to you in the future with finding out test results, communicating by private email, and scheduling acute appointments online when needed.

## 2015-07-10 NOTE — Progress Notes (Signed)
Subjective:    Patient ID: Ryan Diaz, male    DOB: [birth year], 69 y.o.   MRN: [id number]  HPI  Here to f/u, c/o dizziness, vague and difficult but probably lightheaded, intermittent, mild to mod, occas severe, several times occurred in the past wk, worse with standing up after bending to work on car, nothing seems to make better. Pt denies chest pain, increased sob or doe, wheezing, orthopnea, PND, increased LE swelling, palpitations, or syncope.  Pt denies new neurological symptoms such as new headache, or facial or extremity weakness or numbness   Pt denies polydipsia, polyuria.   Does have pain and swelling to are behind right ear with tender, swelling  Does c/o ongoing fatigue, but denies signficant daytime hypersomnolence. Past Medical History  Diagnosis Date  . 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 ALLERGIC RHINITIS 08/08/2007  . ANXIETY 08/08/2007  . COPD 10/03/2009  . DEPRESSION 08/08/2007  . ERECTILE DYSFUNCTION 08/08/2007  . GENITAL HERPES, HX OF 08/08/2007  . GERD 08/08/2007  . GLUCOSE INTOLERANCE 08/08/2007  . HYPERLIPIDEMIA 08/08/2007  . HYPERTENSION 08/08/2007  . PAIN IN SOFT TISSUES OF LIMB 10/02/2009  . Impaired glucose tolerance 09/26/2011  . Insomnia 05/18/2012   No past surgical history on file.  reports that he quit smoking about 5 years ago. His smoking use included Cigarettes. He has a 52.5 pack-year smoking history. He has never used smokeless tobacco. He reports that he drinks about 9.0 oz of alcohol per week. He reports that he does not use illicit drugs. family history includes Colon polyps in his brother. No Known Allergies Current Outpatient Prescriptions on File Prior to Visit  Medication Sig Dispense Refill  . albuterol (PROVENTIL HFA;VENTOLIN HFA) 108 (90 BASE) MCG/ACT inhaler Inhale 2 puffs into the lungs every 6 (six) hours as needed for wheezing or shortness of breath.    . Diclofenac Sodium (PENNSAID) 2 % SOLN Place 2 application onto the skin 2 (two)  times daily. 112 g 3  . fluticasone (FLONASE) 50 MCG/ACT nasal spray Place 2 sprays into both nostrils daily. 16 g 2  . lisinopril (PRINIVIL,ZESTRIL) 10 MG tablet Take 1 tablet (10 mg total) by mouth daily. 90 tablet 3  . omeprazole (PRILOSEC) 20 MG capsule Take 1 capsule (20 mg total) by mouth 2 (two) times daily. 90 capsule 3  . pravastatin (PRAVACHOL) 80 MG tablet Take 1 tablet (80 mg total) by mouth daily. 90 tablet 3  . terazosin (HYTRIN) 2 MG capsule Take 1 capsule (2 mg total) by mouth at bedtime. 90 capsule 3  . tiotropium (SPIRIVA) 18 MCG inhalation capsule Place 18 mcg into inhaler and inhale daily.     No current facility-administered medications on file prior to visit.   Review of Systems  Constitutional: Negative for unusual diaphoresis or night sweats HENT: Negative for ringing in ear or discharge Eyes: Negative for double vision or worsening visual disturbance.  Respiratory: Negative for choking and stridor.   Gastrointestinal: Negative for vomiting or other signifcant bowel change Genitourinary: Negative for hematuria or change in urine volume.  Musculoskeletal: Negative for other MSK pain or swelling Skin: Negative for color change and worsening wound.  Neurological: Negative for tremors and numbness other than noted  Psychiatric/Behavioral: Negative for decreased concentration or agitation other than above       Objective:   Physical Exam BP 134/88 mmHg  Pulse 72  Temp(Src) 98 F (36.7 C) (Oral)  Ht 5\' 9"  (1.753 m)  Wt 162 lb (73.483 kg)  BMI 23.91 kg/m2  SpO2 97% VS noted,  Constitutional: Pt appears in no significant distress HENT: Head: NCAT.  Right Ear: External ear normal.  Left Ear: External ear normal.  Eyes: . Pupils are equal, round, and reactive to light. Conjunctivae and EOM are normal; post right ear mild tender Neck: Normal range of motion. Neck supple.  Cardiovascular: Normal rate and regular rhythm.   Pulmonary/Chest: Effort normal and breath  sounds without rales or wheezing.  Abd:  Soft, NT, ND, + BS Neurological: Pt is alert. Not confused , motor grossly intact Skin: Skin is warm. No rash, no LE edema Psychiatric: Pt behavior is normal. No agitation.     Assessment & Plan:

## 2015-07-12 NOTE — Assessment & Plan Note (Signed)
Also for testosterone check per pt requst,  to f/u any worsening symptoms or concerns

## 2015-07-12 NOTE — Assessment & Plan Note (Signed)
Mild to mod, for antibx course,  to f/u any worsening symptoms or concerns 

## 2015-07-12 NOTE — Assessment & Plan Note (Addendum)
Etiology unclear, no frank syncope, for carotid and echo eval, recent head ct neg so will hold on further imaging,  to f/u any worsening symptoms or concerns  Note:  Total time for pt hx, exam, review of record with pt in the room, determination of diagnoses and plan for further eval and tx is > 40 min, with over 50% spent in coordination and counseling of patient

## 2015-07-12 NOTE — Assessment & Plan Note (Signed)
For f/u today after recent mild low, to f/u any worsening symptoms or concerns, currently asympt

## 2015-07-17 ENCOUNTER — Other Ambulatory Visit: Payer: Self-pay

## 2015-07-17 ENCOUNTER — Encounter: Payer: Self-pay | Admitting: Internal Medicine

## 2015-07-17 ENCOUNTER — Ambulatory Visit (HOSPITAL_COMMUNITY): Payer: Medicare PPO | Attending: Internal Medicine

## 2015-07-17 DIAGNOSIS — R42 Dizziness and giddiness: Secondary | ICD-10-CM

## 2015-07-17 DIAGNOSIS — E785 Hyperlipidemia, unspecified: Secondary | ICD-10-CM | POA: Diagnosis not present

## 2015-07-17 DIAGNOSIS — I10 Essential (primary) hypertension: Secondary | ICD-10-CM | POA: Insufficient documentation

## 2016-02-12 ENCOUNTER — Encounter: Payer: Self-pay | Admitting: Internal Medicine

## 2016-02-12 ENCOUNTER — Ambulatory Visit (INDEPENDENT_AMBULATORY_CARE_PROVIDER_SITE_OTHER): Payer: PPO | Admitting: Internal Medicine

## 2016-02-12 VITALS — BP 140/84 | HR 97 | Temp 99.2°F | Resp 20 | Wt 161.0 lb

## 2016-02-12 DIAGNOSIS — Z0000 Encounter for general adult medical examination without abnormal findings: Secondary | ICD-10-CM | POA: Diagnosis not present

## 2016-02-12 DIAGNOSIS — I10 Essential (primary) hypertension: Secondary | ICD-10-CM

## 2016-02-12 DIAGNOSIS — Z1159 Encounter for screening for other viral diseases: Secondary | ICD-10-CM

## 2016-02-12 DIAGNOSIS — J309 Allergic rhinitis, unspecified: Secondary | ICD-10-CM

## 2016-02-12 DIAGNOSIS — J019 Acute sinusitis, unspecified: Secondary | ICD-10-CM

## 2016-02-12 DIAGNOSIS — J449 Chronic obstructive pulmonary disease, unspecified: Secondary | ICD-10-CM | POA: Diagnosis not present

## 2016-02-12 MED ORDER — LEVOFLOXACIN 250 MG PO TABS: 250.0000 mg | ORAL_TABLET | Freq: Every day | ORAL | Status: DC

## 2016-02-12 NOTE — Progress Notes (Signed)
Pre visit review using our clinic review tool, if applicable. No additional management support is needed unless otherwise documented below in the visit note. 

## 2016-02-12 NOTE — Progress Notes (Signed)
Subjective:    Patient ID: Ryan Diaz, male    DOB: 1946/01/31, 70 y.o.   MRN: [id number]  HPI  Here for wellness and f/u;  Overall doing ok;  Pt denies Chest pain, worsening SOB, DOE, wheezing, orthopnea, PND, worsening LE edema, palpitations, dizziness or syncope.  Pt denies neurological change such as new headache, facial or extremity weakness.  Pt denies polydipsia, polyuria, or low sugar symptoms. Pt states overall good compliance with treatment and medications, good tolerability, and has been trying to follow appropriate diet.  Pt denies worsening depressive symptoms, suicidal ideation or panic. No fever, night sweats, wt loss, loss of appetite, or other constitutional symptoms.  Pt states good ability with ADL's, has low fall risk, home safety reviewed and adequate, no other significant changes in hearing or vision, and only occasionally active with exercise.  Tot chol about 256 at [place] recently. Incidentally today -  Here with 2-3 days acute onset fever, facial pain, pressure, headache, general weakness and malaise, and greenish d/c, with mild ST and cough, but pt denies chest pain, wheezing, increased sob or doe, orthopnea, PND, increased LE swelling, palpitations, dizziness or syncope.  Does have several wks ongoing nasal allergy symptoms with clearish congestion, itch and sneezing, without fever, pain, ST, cough, swelling or wheezing. Past Medical History  Diagnosis Date  . 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 ALLERGIC RHINITIS 08/08/2007  . ANXIETY 08/08/2007  . COPD 10/03/2009  . DEPRESSION 08/08/2007  . ERECTILE DYSFUNCTION 08/08/2007  . GENITAL HERPES, HX OF 08/08/2007  . GERD 08/08/2007  . GLUCOSE INTOLERANCE 08/08/2007  . HYPERLIPIDEMIA 08/08/2007  . HYPERTENSION 08/08/2007  . PAIN IN SOFT TISSUES OF LIMB 10/02/2009  . Impaired glucose tolerance 09/26/2011  . Insomnia 05/18/2012   No past surgical history on file.  reports that he quit smoking about 6 years ago. His smoking use  included Cigarettes. He has a 52.5 pack-year smoking history. He has never used smokeless tobacco. He reports that he drinks about 9.0 oz of alcohol per week. He reports that he does not use illicit drugs. family history includes Colon polyps in his brother. No Known Allergies Current Outpatient Prescriptions on File Prior to Visit  Medication Sig Dispense Refill  . albuterol (PROVENTIL HFA;VENTOLIN HFA) 108 (90 BASE) MCG/ACT inhaler Inhale 2 puffs into the lungs every 6 (six) hours as needed for wheezing or shortness of breath.    . clarithromycin (BIAXIN) 250 MG tablet Take 1 tablet (250 mg total) by mouth 2 (two) times daily. 20 tablet 0  . Diclofenac Sodium (PENNSAID) 2 % SOLN Place 2 application onto the skin 2 (two) times daily. 112 g 3  . fluticasone (FLONASE) 50 MCG/ACT nasal spray Place 2 sprays into both nostrils daily. 16 g 2  . lisinopril (PRINIVIL,ZESTRIL) 10 MG tablet Take 1 tablet (10 mg total) by mouth daily. 90 tablet 3  . omeprazole (PRILOSEC) 20 MG capsule Take 1 capsule (20 mg total) by mouth 2 (two) times daily. 90 capsule 3  . pravastatin (PRAVACHOL) 80 MG tablet Take 1 tablet (80 mg total) by mouth daily. 90 tablet 3  . terazosin (HYTRIN) 2 MG capsule Take 1 capsule (2 mg total) by mouth at bedtime. 90 capsule 3  . tiotropium (SPIRIVA) 18 MCG inhalation capsule Place 18 mcg into inhaler and inhale daily.    Marland Kitchen zolpidem (AMBIEN) 10 MG tablet TAKE ONE TABLET BY MOUTH ONCE DAILY AT BEDTIME AS NEEDED FOR SLEEP 90 tablet 1   No current facility-administered  medications on file prior to visit.   . Review of Systems Constitutional: Negative for increased diaphoresis, or other activity, appetite or siginficant weight change other than noted HENT: Negative for worsening hearing loss, ear pain, facial swelling, mouth sores and neck stiffness.   Eyes: Negative for other worsening pain, redness or visual disturbance.  Respiratory: Negative for choking or stridor Cardiovascular:  Negative for other chest pain and palpitations.  Gastrointestinal: Negative for worsening diarrhea, blood in stool, or abdominal distention Genitourinary: Negative for hematuria, flank pain or change in urine volume.  Musculoskeletal: Negative for myalgias or other joint complaints.  Skin: Negative for other color change and wound or drainage.  Neurological: Negative for syncope and numbness. other than noted Hematological: Negative for adenopathy. or other swelling Psychiatric/Behavioral: Negative for hallucinations, SI, self-injury, decreased concentration or other worsening agitation.      Objective:   Physical Exam BP 140/84 mmHg  Pulse 97  Temp(Src) 99.2 F (37.3 C) (Oral)  Resp 20  Wt 161 lb (73.029 kg)  SpO2 96% VS noted, mild ill Constitutional: Pt is oriented to person, place, and time. Appears well-developed and well-nourished, in no significant distress Head: Normocephalic and atraumatic  Eyes: Conjunctivae and EOM are normal. Pupils are equal, round, and reactive to light Right Ear: External ear normal.  Left Ear: External ear normal Nose: Nose normal.  Bilat tm's with mild erythema.  Max sinus areas mild tender.  Pharynx with mild erythema, no exudate Mouth/Throat: Oropharynx is clear and moist  Neck: Normal range of motion. Neck supple. No JVD present. No tracheal deviation present or significant neck LA or mass Cardiovascular: Normal rate, regular rhythm, normal heart sounds and intact distal pulses.   Pulmonary/Chest: Effort normal and breath sounds decreased without rales or wheezing  Abdominal: Soft. Bowel sounds are normal. NT. No HSM  Musculoskeletal: Normal range of motion. Exhibits no edema Lymphadenopathy: Has no cervical adenopathy.  Neurological: Pt is alert and oriented to person, place, and time. Pt has normal reflexes. No cranial nerve deficit. Motor grossly intact Skin: Skin is warm and dry. No rash noted or new ulcers Psychiatric:  Has normal mood and  affect. Behavior is normal.     Assessment & Plan:

## 2016-02-12 NOTE — Assessment & Plan Note (Signed)
Mild to mod seasonal flare, for depomedrol IM, for restart flonase,,  to f/u any worsening symptoms or concerns

## 2016-02-12 NOTE — Assessment & Plan Note (Signed)
stable overall by history and exam, recent data reviewed with pt, and pt to continue medical treatment as before,  to f/u any worsening symptoms or concerns BP Readings from Last 3 Encounters:  02/12/16 140/84  07/10/15 134/88  04/23/15 132/80

## 2016-02-12 NOTE — Assessment & Plan Note (Addendum)
Mild to mod, for antibx course,  to f/u any worsening symptoms or concerns  In addition to the time spent performing CPE, I spent an additional 40 minutes face to face,in which greater than 50% of this time was spent in counseling and coordination of care for patient's acute illness as documented.  

## 2016-02-12 NOTE — Assessment & Plan Note (Signed)
stable overall by history and exam, recent data reviewed with pt, and pt to continue medical treatment as before,  to f/u any worsening symptoms or concerns SpO2 Readings from Last 3 Encounters:  02/12/16 96%  07/10/15 97%  04/23/15 96%

## 2016-02-12 NOTE — Assessment & Plan Note (Signed)

## 2016-02-12 NOTE — Patient Instructions (Addendum)
Please take all new medication as prescribed - the antibiotic  You had the steroid shot today  You can also take Delsym OTC for cough, and/or Mucinex (or it's generic off brand) for congestion, and tylenol as needed for pain.  Please continue all other medications as before, and refills have been done if requested.  Please have the pharmacy call with any other refills you may need.  Please continue your efforts at being more active, low cholesterol diet, and weight control.  You are otherwise up to date with prevention measures today.  Please keep your appointments with your specialists as you may have planned  Please go to the LAB in the Basement (turn left off the elevator) for the tests to be done tomorrow  You will be contacted by phone if any changes need to be made immediately.  Otherwise, you will receive a letter about your results with an explanation, but please check with MyChart first.  Please remember to sign up for MyChart if you have not done so, as this will be important to you in the future with finding out test results, communicating by private email, and scheduling acute appointments online when needed.  Please return in 6 months, or sooner if needed

## 2016-02-14 ENCOUNTER — Other Ambulatory Visit (INDEPENDENT_AMBULATORY_CARE_PROVIDER_SITE_OTHER): Payer: PPO

## 2016-02-14 ENCOUNTER — Encounter: Payer: Self-pay | Admitting: Internal Medicine

## 2016-02-14 DIAGNOSIS — Z0000 Encounter for general adult medical examination without abnormal findings: Secondary | ICD-10-CM | POA: Diagnosis not present

## 2016-02-14 DIAGNOSIS — Z1159 Encounter for screening for other viral diseases: Secondary | ICD-10-CM | POA: Diagnosis not present

## 2016-02-14 LAB — BASIC METABOLIC PANEL
BUN: 16 mg/dL (ref 6–23)
CO2: 27 mEq/L (ref 19–32)
Calcium: 9.5 mg/dL (ref 8.4–10.5)
Chloride: 100 mEq/L (ref 96–112)
Creatinine, Ser: 1.17 mg/dL (ref 0.40–1.50)
GFR: 79.26 mL/min (ref >60.00)
Glucose, Bld: 97 mg/dL (ref 70–99)
Potassium: 4.5 mEq/L (ref 3.5–5.1)
Sodium: 136 mEq/L (ref 135–145)

## 2016-02-14 LAB — CBC WITH DIFFERENTIAL/PLATELET
Basophils Absolute: 0 10*3/uL (ref 0.0–0.1)
Basophils Relative: 0.7 % (ref 0.0–3.0)
Eosinophils Absolute: 0.6 10*3/uL (ref 0.0–0.7)
Eosinophils Relative: 9 % — ABNORMAL HIGH (ref 0.0–5.0)
HCT: 40.2 % (ref 39.0–52.0)
Hemoglobin: 13.6 g/dL (ref 13.0–17.0)
Lymphocytes Relative: 28.5 % (ref 12.0–46.0)
Lymphs Abs: 2 10*3/uL (ref 0.7–4.0)
MCHC: 33.9 g/dL (ref 30.0–36.0)
MCV: 95.4 fl (ref 78.0–100.0)
Monocytes Absolute: 1.3 10*3/uL — ABNORMAL HIGH (ref 0.1–1.0)
Monocytes Relative: 18.5 % — ABNORMAL HIGH (ref 3.0–12.0)
Neutro Abs: 3 10*3/uL (ref 1.4–7.7)
Neutrophils Relative %: 43.3 % (ref 43.0–77.0)
Platelets: 280 10*3/uL (ref 150.0–400.0)
RBC: 4.22 Mil/uL (ref 4.22–5.81)
RDW: 13.5 % (ref 11.5–15.5)
WBC: 7 10*3/uL (ref 4.0–10.5)

## 2016-02-14 LAB — TSH: TSH: 1.47 u[IU]/mL (ref 0.35–4.50)

## 2016-02-14 LAB — URINALYSIS, ROUTINE W REFLEX MICROSCOPIC
Bilirubin Urine: NEGATIVE
Hgb urine dipstick: NEGATIVE
Ketones, ur: NEGATIVE
Leukocytes, UA: NEGATIVE
Nitrite: NEGATIVE
Specific Gravity, Urine: 1.02 (ref 1.000–1.030)
Total Protein, Urine: NEGATIVE
Urine Glucose: NEGATIVE
Urobilinogen, UA: 0.2 (ref 0.0–1.0)
pH: 6.5 (ref 5.0–8.0)

## 2016-02-14 LAB — LIPID PANEL
Cholesterol: 145 mg/dL (ref 0–200)
HDL: 39.5 mg/dL (ref >39.00)
LDL Cholesterol: 89 mg/dL (ref 0–99)
NonHDL: 105.02
Total CHOL/HDL Ratio: 4
Triglycerides: 79 mg/dL (ref 0.0–149.0)
VLDL: 15.8 mg/dL (ref 0.0–40.0)

## 2016-02-14 LAB — HEPATIC FUNCTION PANEL
ALT: 29 U/L (ref 0–53)
AST: 25 U/L (ref 0–37)
Albumin: 4 g/dL (ref 3.5–5.2)
Alkaline Phosphatase: 88 U/L (ref 39–117)
Bilirubin, Direct: 0.1 mg/dL (ref 0.0–0.3)
Total Bilirubin: 0.6 mg/dL (ref 0.2–1.2)
Total Protein: 7.2 g/dL (ref 6.0–8.3)

## 2016-02-14 LAB — HEPATITIS C ANTIBODY: HCV Ab: NEGATIVE

## 2016-02-14 LAB — PSA: PSA: 0.75 ng/mL (ref 0.10–4.00)

## 2016-07-10 ENCOUNTER — Ambulatory Visit: Payer: PPO | Admitting: Internal Medicine

## 2016-07-15 ENCOUNTER — Ambulatory Visit (INDEPENDENT_AMBULATORY_CARE_PROVIDER_SITE_OTHER): Payer: PPO | Admitting: Internal Medicine

## 2016-07-15 ENCOUNTER — Encounter: Payer: Self-pay | Admitting: Internal Medicine

## 2016-07-15 VITALS — BP 136/72 | HR 90 | Temp 98.2°F | Resp 20 | Wt 164.0 lb

## 2016-07-15 DIAGNOSIS — G4733 Obstructive sleep apnea (adult) (pediatric): Secondary | ICD-10-CM

## 2016-07-15 DIAGNOSIS — G5601 Carpal tunnel syndrome, right upper limb: Secondary | ICD-10-CM

## 2016-07-15 DIAGNOSIS — R6889 Other general symptoms and signs: Secondary | ICD-10-CM

## 2016-07-15 DIAGNOSIS — J449 Chronic obstructive pulmonary disease, unspecified: Secondary | ICD-10-CM | POA: Diagnosis not present

## 2016-07-15 DIAGNOSIS — Z0001 Encounter for general adult medical examination with abnormal findings: Secondary | ICD-10-CM

## 2016-07-15 DIAGNOSIS — R413 Other amnesia: Secondary | ICD-10-CM

## 2016-07-15 DIAGNOSIS — F411 Generalized anxiety disorder: Secondary | ICD-10-CM

## 2016-07-15 MED ORDER — CITALOPRAM HYDROBROMIDE 20 MG PO TABS: 20.0000 mg | ORAL_TABLET | Freq: Every day | ORAL | 3 refills | Status: DC

## 2016-07-15 NOTE — Progress Notes (Signed)
Pre visit review using our clinic review tool, if applicable. No additional management support is needed unless otherwise documented below in the visit note. 

## 2016-07-15 NOTE — Patient Instructions (Addendum)
Please take all new medication as prescribed - the celexa for nerves  Please continue all other medications as before, and refills have been done if requested.  Please have the pharmacy call with any other refills you may need.  Please keep your appointments with your specialists as you may have planned  You will be contacted regarding the referral for: pulmonary for the sleep apnea  Please call if you change your mind about the referral for the right carpal tunnel  Please return in 6 months, or sooner if needed, with Lab testing done 3-5 days before

## 2016-07-15 NOTE — Progress Notes (Signed)
Subjective:    Patient ID: Ryan Diaz, male    DOB: 01/15/1946, 70 y.o.   MRN: [id number]  HPI  Here with memory issue graudally worse x 2 yrs, started as more forgetful but now finds himself going past a location he was driving to, and sometimes loses train of thought in conversations.  Has had mild depressive symptoms but not really worsening, and no suicidal ideation, or panic; has ongoing anxiety as well. Has been rx some kinds of meds over the years through the [place]. Marland KitchenAlso has known OSA but never tried the tx after a sleep study about 2 yrs ago.  Pt could not tolerate the mask well Does c/o ongoing fatigue, and has some signficant daytime hypersomnolence. As well.  Also has noted some atrophy and numbness getting worse to the right wrist, has declined surgury in the past.  Pt denies chest pain, increased sob or doe, wheezing, orthopnea, PND, increased LE swelling, palpitations, dizziness or syncope. Pt denies new neurological symptoms such as new headache, or facial or extremity weakness or numbness   Pt denies polydipsia, polyuria  Past Medical History:  Diagnosis Date  . 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 ALLERGIC RHINITIS 08/08/2007  . ANXIETY 08/08/2007  . COPD 10/03/2009  . DEPRESSION 08/08/2007  . ERECTILE DYSFUNCTION 08/08/2007  . GENITAL HERPES, HX OF 08/08/2007  . GERD 08/08/2007  . GLUCOSE INTOLERANCE 08/08/2007  . HYPERLIPIDEMIA 08/08/2007  . HYPERTENSION 08/08/2007  . Impaired glucose tolerance 09/26/2011  . Insomnia 05/18/2012  . PAIN IN SOFT TISSUES OF LIMB 10/02/2009   No past surgical history on file.  reports that he quit smoking about 6 years ago. His smoking use included Cigarettes. He has a 52.50 pack-year smoking history. He has never used smokeless tobacco. He reports that he drinks about 9.0 oz of alcohol per week . He reports that he does not use drugs. family history includes Colon polyps in his brother. No Known Allergies Current Outpatient Prescriptions on File  Prior to Visit  Medication Sig Dispense Refill  . albuterol (PROVENTIL HFA;VENTOLIN HFA) 108 (90 BASE) MCG/ACT inhaler Inhale 2 puffs into the lungs every 6 (six) hours as needed for wheezing or shortness of breath.    . Diclofenac Sodium (PENNSAID) 2 % SOLN Place 2 application onto the skin 2 (two) times daily. 112 g 3  . fluticasone (FLONASE) 50 MCG/ACT nasal spray Place 2 sprays into both nostrils daily. 16 g 2  . lisinopril (PRINIVIL,ZESTRIL) 10 MG tablet Take 1 tablet (10 mg total) by mouth daily. 90 tablet 3  . omeprazole (PRILOSEC) 20 MG capsule Take 1 capsule (20 mg total) by mouth 2 (two) times daily. 90 capsule 3  . pravastatin (PRAVACHOL) 80 MG tablet Take 1 tablet (80 mg total) by mouth daily. 90 tablet 3  . terazosin (HYTRIN) 2 MG capsule Take 1 capsule (2 mg total) by mouth at bedtime. 90 capsule 3  . tiotropium (SPIRIVA) 18 MCG inhalation capsule Place 18 mcg into inhaler and inhale daily.    Marland Kitchen zolpidem (AMBIEN) 10 MG tablet TAKE ONE TABLET BY MOUTH ONCE DAILY AT BEDTIME AS NEEDED FOR SLEEP 90 tablet 1   No current facility-administered medications on file prior to visit.    Review of Systems   Constitutional: Negative for unusual diaphoresis or night sweats HENT: Negative for ear swelling or discharge Eyes: Negative for worsening visual haziness  Respiratory: Negative for choking and stridor.   Gastrointestinal: Negative for distension or worsening eructation Genitourinary: Negative for  retention or change in urine volume.  Musculoskeletal: Negative for other MSK pain or swelling Skin: Negative for color change and worsening wound Neurological: Negative for tremors and numbness other than noted  Psychiatric/Behavioral: Negative for decreased concentration or agitation other than above       Objective:   Physical Exam BP 136/72   Pulse 90   Temp 98.2 F (36.8 C) (Oral)   Resp 20   Wt 164 lb (74.4 kg)   SpO2 95%   BMI 24.22 kg/m  VS noted,  Constitutional: Pt  appears in no apparent distress HENT: Head: NCAT.  Right Ear: External ear normal.  Left Ear: External ear normal.  Eyes: . Pupils are equal, round, and reactive to light. Conjunctivae and EOM are normal Neck: Normal range of motion. Neck supple.  Cardiovascular: Normal rate and regular rhythm.   Pulmonary/Chest: Effort normal and breath sounds without rales or wheezing.  Abd:  Soft, NT, ND, + BS Neurological: Pt is alert. Not confused , motor 5/5 intact, minimental formal not done, + atrophy to first web space right hand Skin: Skin is warm. No rash, no LE edema Psychiatric: Pt behavior is normal. No agitation. + nervous/mild depressed affect     Assessment & Plan:

## 2016-07-19 NOTE — Assessment & Plan Note (Signed)
Mild to mod, for celexa 10 qd,  to f/u any worsening symptoms or concerns 

## 2016-07-19 NOTE — Assessment & Plan Note (Signed)
Pt requests referral in f/u,  to f/u any worsening symptoms or concerns

## 2016-07-19 NOTE — Assessment & Plan Note (Signed)
stable overall by history and exam, recent data reviewed with pt, and pt to continue medical treatment as before,  to f/u any worsening symptoms or concerns @LASTSAO2(3)@  

## 2016-07-19 NOTE — Assessment & Plan Note (Signed)
With evidence for atrophy right hand first web space, still declines hand surgury referal for now

## 2016-07-19 NOTE — Assessment & Plan Note (Signed)
Suspect possible pseudodementia vs ETOH related dementia,  to f/u any worsening symptoms or concerns

## 2016-07-27 ENCOUNTER — Encounter: Payer: Self-pay | Admitting: Internal Medicine

## 2016-07-27 ENCOUNTER — Telehealth: Payer: Self-pay | Admitting: Internal Medicine

## 2016-07-27 ENCOUNTER — Ambulatory Visit (INDEPENDENT_AMBULATORY_CARE_PROVIDER_SITE_OTHER): Payer: PPO | Admitting: Internal Medicine

## 2016-07-27 VITALS — BP 116/68 | HR 79 | Ht 70.5 in | Wt 163.6 lb

## 2016-07-27 DIAGNOSIS — F1010 Alcohol abuse, uncomplicated: Secondary | ICD-10-CM | POA: Diagnosis not present

## 2016-07-27 DIAGNOSIS — G4733 Obstructive sleep apnea (adult) (pediatric): Secondary | ICD-10-CM | POA: Diagnosis not present

## 2016-07-27 DIAGNOSIS — G4700 Insomnia, unspecified: Secondary | ICD-10-CM | POA: Diagnosis not present

## 2016-07-27 DIAGNOSIS — Z23 Encounter for immunization: Secondary | ICD-10-CM

## 2016-07-27 MED ORDER — CLONAZEPAM 0.5 MG PO TABS: ORAL_TABLET | ORAL | 5 refills | Status: DC

## 2016-07-27 NOTE — Patient Instructions (Addendum)
Order - New DME new CPAP auto 5-20, mask of choice, humidifier, supplies, AirView    Dx OSA  Script printed for clonazepam to help sleep   Try 1 or 2 , taken about 30 minutes before bedtime, as needed  Please call as needed

## 2016-07-27 NOTE — Assessment & Plan Note (Signed)
Difficulty initiating and maintaining sleep. Witness is not available. Possible contribution from alcohol use and from limb movement. Insufficient control with Ambien. It'll be important to help him maintain reasonable sleep quality so that he can tolerate CPAP. Plan-try clonazepam with appropriate discussion. Maintaining good sleep habits.

## 2016-07-27 NOTE — Assessment & Plan Note (Signed)
He never began CPAP after sleep study, afraid it would aggravate his insomnia. Now willing to try after discussion of medical concerns of untreated sleep apnea and treatment options. We discussed basic sleep hygiene, responsibility to drive safely. Plan-start CPAP with auto titration

## 2016-07-27 NOTE — Progress Notes (Signed)
07/27/2016- 70 year old male former smoker referred courtesy of Dr. Jonny Ruiz for OSA complicated by insomnia. Last seen by Dr. Vassie Loll in 2015 for follow-up of OSA and sleep onset insomnia. Some benefit with Ambien. He was advised to use melatonin and to reduce alcohol.  FOLLOW FOR: Sleep Apnea.  Sleep Study done 2014; wakes up a lot in the middle of the night; snoring; witnessed apneas.  ES: 9 NPSG 10/31/13- AHI 52/hour, desaturation to 83%, body weight 165 pounds. He had sleep study but never went for CPAP, afraid that it would aggravate his insomnia. A friend of his, successful with CPAP, has now convinced him to try it. His girlfriend tells him he snores very loudly. Despite Ambien 1/210 mg 2 or 3 times weekly, he only sleeps 4-6 hours with waking after sleep onset for 5 times per night for bathroom and just restless. He does have some leg movement-unclear how much. Occasional daytime sleepiness but no naps. 3 or 4 cups of coffee in the morning. No ENT surgery. He denies heart or lung disease but admits occasional dyspnea on exertion. 3 or 4 beers most days.  Prior to Admission medications   Medication Sig Start Date End Date Taking? Authorizing Provider  albuterol (PROVENTIL HFA;VENTOLIN HFA) 108 (90 BASE) MCG/ACT inhaler Inhale 2 puffs into the lungs every 6 (six) hours as needed for wheezing or shortness of breath.   Yes Historical Provider, MD  citalopram (CELEXA) 20 MG tablet Take 1 tablet (20 mg total) by mouth daily. 07/15/16  Yes Corwin Levins, MD  Diclofenac Sodium (PENNSAID) 2 % SOLN Place 2 application onto the skin 2 (two) times daily. 05/03/14  Yes Judi Saa, DO  fluticasone (FLONASE) 50 MCG/ACT nasal spray Place 2 sprays into both nostrils daily. 01/09/15  Yes Corwin Levins, MD  lisinopril (PRINIVIL,ZESTRIL) 10 MG tablet Take 1 tablet (10 mg total) by mouth daily. 01/09/15  Yes Corwin Levins, MD  omeprazole (PRILOSEC) 20 MG capsule Take 1 capsule (20 mg total) by mouth 2 (two) times daily.  11/18/11  Yes Corwin Levins, MD  pravastatin (PRAVACHOL) 80 MG tablet Take 1 tablet (80 mg total) by mouth daily. 11/18/11  Yes Corwin Levins, MD  terazosin (HYTRIN) 2 MG capsule Take 1 capsule (2 mg total) by mouth at bedtime. 01/09/15  Yes Corwin Levins, MD  tiotropium (SPIRIVA) 18 MCG inhalation capsule Place 18 mcg into inhaler and inhale daily.   Yes Historical Provider, MD  zolpidem (AMBIEN) 10 MG tablet TAKE ONE TABLET BY MOUTH ONCE DAILY AT BEDTIME AS NEEDED FOR SLEEP 07/10/15  Yes Corwin Levins, MD  clonazePAM Scarlette Calico) 0.5 MG tablet 1 or 2 for sleep if needed 07/27/16   Waymon Budge, MD   Past Medical History:  Diagnosis Date  . 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 ALLERGIC RHINITIS 08/08/2007  . ANXIETY 08/08/2007  . COPD 10/03/2009  . DEPRESSION 08/08/2007  . ERECTILE DYSFUNCTION 08/08/2007  . GENITAL HERPES, HX OF 08/08/2007  . GERD 08/08/2007  . GLUCOSE INTOLERANCE 08/08/2007  . HYPERLIPIDEMIA 08/08/2007  . HYPERTENSION 08/08/2007  . Impaired glucose tolerance 09/26/2011  . Insomnia 05/18/2012  . PAIN IN SOFT TISSUES OF LIMB 10/02/2009   No past surgical history on file. Family History  Problem Relation Age of Onset  . Colon polyps Brother    Social History   Social History  . Marital status: Single    Spouse name: N/A  . Number of children: N/A  . Years of education:  N/A   Occupational History  . self employed auto body work    Social History Main Topics  . Smoking status: Former Smoker    Packs/day: 1.50    Years: 35.00    Types: Cigarettes    Quit date: 09/04/2009  . Smokeless tobacco: Never Used  . Alcohol use 9.0 oz/week    18 drink(s) per week  . Drug use: No  . Sexual activity: Not on file   Other Topics Concern  . Not on file   Social History Narrative  . No narrative on file   ROS-see HPI   Negative unless "+" Constitutional:    weight loss, night sweats, fevers, chills, +fatigue, lassitude. HEENT:    headaches, difficulty swallowing, tooth/dental  problems, sore throat,       sneezing, itching, ear ache, nasal congestion, post nasal drip, snoring CV:    chest pain, orthopnea, PND, swelling in lower extremities, anasarca,                                              dizziness, palpitations Resp:   shortness of breath with exertion or at rest.                productive cough,   non-productive cough, coughing up of blood.              change in color of mucus.  wheezing.   Skin:    rash or lesions. GI:  No-   heartburn, indigestion, abdominal pain, nausea, vomiting, diarrhea,                 change in bowel habits, loss of appetite GU: dysuria, change in color of urine, no urgency or frequency.   flank pain. MS:   joint pain, stiffness, decreased range of motion, back pain. Neuro-     nothing unusual Psych:  change in mood or affect.  depression or anxiety.   memory loss.  OBJ- Physical Exam General- Alert, Oriented, Affect-appropriate, Distress- none acute, + Trim Skin- rash-none, lesions- none, excoriation- none Lymphadenopathy- none Head- atraumatic            Eyes- Gross vision intact, PERRLA, conjunctivae and secretions clear            Ears- Hearing, canals-normal            Nose- Clear, no-Septal dev, mucus, polyps, erosion, perforation             Throat- Mallampati III-IV , mucosa clear , drainage- none, tonsils- atrophic Neck- flexible , trachea midline, no stridor , thyroid nl, carotid no bruit Chest - symmetrical excursion , unlabored           Heart/CV- RRR , no murmur , no gallop  , no rub, nl s1 s2                           - JVD- none , edema- none, stasis changes- none, varices- none           Lung- clear to P&A, wheeze- none, cough- none , dullness-none, rub- none           Chest wall-  Abd-  Br/ Gen/ Rectal- Not done, not indicated Extrem- cyanosis- none, clubbing, none, atrophy- none, strength- nl Neuro- grossly intact to observation

## 2016-07-27 NOTE — Assessment & Plan Note (Signed)
He admits to 3 or 4 beers per day. Recognize this may be underestimated. Discussed impact of alcohol on sleep quality.

## 2016-07-28 NOTE — Telephone Encounter (Signed)
Spoke with pt and offered appt date/time. Pt accepted and appt scheduled. Nothing further needed.

## 2016-07-28 NOTE — Telephone Encounter (Signed)
Pt can be seen 10-21-16 at 11:00am slot (30 min); spots held for patient. Thanks.

## 2016-07-28 NOTE — Telephone Encounter (Signed)
Katie please advise of a time in December for the pt. thanks

## 2016-07-31 ENCOUNTER — Encounter: Payer: Self-pay | Admitting: *Deleted

## 2016-07-31 ENCOUNTER — Telehealth: Payer: Self-pay | Admitting: *Deleted

## 2016-07-31 DIAGNOSIS — G4733 Obstructive sleep apnea (adult) (pediatric): Secondary | ICD-10-CM

## 2016-07-31 NOTE — Telephone Encounter (Signed)
Order for HST has been ordered.  Please advise Almyra FreeLibby thanks

## 2016-07-31 NOTE — Telephone Encounter (Signed)
That is fine we will call the pt and let him know Ryan Diaz

## 2016-07-31 NOTE — Telephone Encounter (Signed)
CY Please advise which type of sleep study you would like patient to have. Thanks.

## 2016-07-31 NOTE — Telephone Encounter (Signed)
Please order unattended home sleep test for dx OSA

## 2016-07-31 NOTE — Telephone Encounter (Signed)
-----   Message from Tobe SosSally E Ottinger sent at 07/30/2016  3:37 PM EDT ----- according to dme pt's sleep study is too old to start a new cpap on he will need another sleep study?

## 2016-08-29 ENCOUNTER — Encounter (HOSPITAL_COMMUNITY): Payer: Self-pay

## 2016-08-29 ENCOUNTER — Ambulatory Visit (HOSPITAL_COMMUNITY)
Admission: EM | Admit: 2016-08-29 | Discharge: 2016-08-29 | Disposition: A | Discharge status: Home / Self Care | Payer: PPO | Attending: Radiology | Admitting: Radiology

## 2016-08-29 DIAGNOSIS — B349 Viral infection, unspecified: Secondary | ICD-10-CM | POA: Diagnosis not present

## 2016-08-29 MED ORDER — TRIAMCINOLONE ACETONIDE 40 MG/ML IJ SUSP
40.0000 mg | Freq: Once | INTRAMUSCULAR | Status: AC
  Administered 2016-08-29: 40 mg via INTRAMUSCULAR

## 2016-08-29 MED ORDER — TRIAMCINOLONE ACETONIDE 40 MG/ML IJ SUSP
INTRAMUSCULAR | Status: AC
  Filled 2016-08-29: qty 1

## 2016-08-29 MED ORDER — PREDNISONE 5 MG PO TABS: 10.0000 mg | ORAL_TABLET | Freq: Every day | ORAL | 0 refills | Status: DC

## 2016-08-29 NOTE — Discharge Instructions (Signed)
Continue to take over the counter medication for symptom control and push fluids

## 2016-08-29 NOTE — ED Provider Notes (Signed)
CSN: [id number]     Arrival date & time 08/29/16  1222 History   None    Chief Complaint  Patient presents with  . Influenza   (Consider location/radiation/quality/duration/timing/severity/associated sxs/prior Treatment) 69 y.o. male presents with nasal congestion, sore throat and productive cough  X 1 week . Condition is acute in nature. Condition is made better by nothing. Condition is made worse by nothing. Patient denies any relief from mucinex and nyquil prior to there arrival at this facility. Patient denies and fevers or sick contact.        Past Medical History:  Diagnosis Date  . 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 ALLERGIC RHINITIS 08/08/2007  . ANXIETY 08/08/2007  . COPD 10/03/2009  . DEPRESSION 08/08/2007  . ERECTILE DYSFUNCTION 08/08/2007  . GENITAL HERPES, HX OF 08/08/2007  . GERD 08/08/2007  . GLUCOSE INTOLERANCE 08/08/2007  . HYPERLIPIDEMIA 08/08/2007  . HYPERTENSION 08/08/2007  . Impaired glucose tolerance 09/26/2011  . Insomnia 05/18/2012  . PAIN IN SOFT TISSUES OF LIMB 10/02/2009   History reviewed. No pertinent surgical history. Family History  Problem Relation Age of Onset  . Colon polyps Brother    Social History  Substance Use Topics  . Smoking status: Former Smoker    Packs/day: 1.50    Years: 35.00    Types: Cigarettes    Quit date: 09/04/2009  . Smokeless tobacco: Never Used  . Alcohol use 9.0 oz/week    18 Standard drinks or equivalent per week    Review of Systems  Constitutional: Negative for fever.  HENT: Positive for congestion, postnasal drip and sore throat.   Respiratory: Positive for cough ( productive yellow brown in nature).     Allergies  Review of patient's allergies indicates no known allergies.  Home Medications   Prior to Admission medications   Medication Sig Start Date End Date Taking? Authorizing Provider  lisinopril (PRINIVIL,ZESTRIL) 10 MG tablet Take 1 tablet (10 mg total) by mouth daily. 01/09/15  Yes Corwin Levins, MD  pravastatin (PRAVACHOL) 80 MG tablet Take 1 tablet (80 mg total) by mouth daily. 11/18/11  Yes Corwin Levins, MD  albuterol (PROVENTIL HFA;VENTOLIN HFA) 108 (90 BASE) MCG/ACT inhaler Inhale 2 puffs into the lungs every 6 (six) hours as needed for wheezing or shortness of breath.    Historical Provider, MD  citalopram (CELEXA) 20 MG tablet Take 1 tablet (20 mg total) by mouth daily. 07/15/16   Corwin Levins, MD  clonazePAM Scarlette Calico) 0.5 MG tablet 1 or 2 for sleep if needed 07/27/16   Waymon Budge, MD  Diclofenac Sodium (PENNSAID) 2 % SOLN Place 2 application onto the skin 2 (two) times daily. 05/03/14   Judi Saa, DO  fluticasone (FLONASE) 50 MCG/ACT nasal spray Place 2 sprays into both nostrils daily. 01/09/15   Corwin Levins, MD  omeprazole (PRILOSEC) 20 MG capsule Take 1 capsule (20 mg total) by mouth 2 (two) times daily. 11/18/11   Corwin Levins, MD  predniSONE (DELTASONE) 5 MG tablet Take 2 tablets (10 mg total) by mouth daily with breakfast. 08/29/16   Alene Mires, NP  terazosin (HYTRIN) 2 MG capsule Take 1 capsule (2 mg total) by mouth at bedtime. 01/09/15   Corwin Levins, MD  tiotropium (SPIRIVA) 18 MCG inhalation capsule Place 18 mcg into inhaler and inhale daily.    Historical Provider, MD  zolpidem (AMBIEN) 10 MG tablet TAKE ONE TABLET BY MOUTH ONCE DAILY AT BEDTIME AS NEEDED FOR SLEEP 07/10/15  Corwin LevinsJames W John, MD   Meds Ordered and Administered this Visit   Medications  triamcinolone acetonide (KENALOG-40) injection 40 mg (40 mg Intramuscular Given 08/29/16 1352)    BP 125/68 (BP Location: Left Arm)   Pulse 84   Temp 98.6 F (37 C) (Oral)   Resp 14   SpO2 96%  No data found.   Physical Exam  Constitutional: He is oriented to person, place, and time. He appears well-developed and well-nourished.  HENT:  Head: Normocephalic.  Neck: Normal range of motion.  Cardiovascular: Normal rate and regular rhythm.   Pulmonary/Chest: Effort normal and breath sounds normal.   Musculoskeletal: Normal range of motion.  Neurological: He is alert and oriented to person, place, and time.  Skin: Skin is dry.  Psychiatric: He has a normal mood and affect.  Nursing note and vitals reviewed.   Urgent Care Course   Clinical Course    Procedures (including critical care time)  Labs Review Labs Reviewed - No data to display  Imaging Review No results found.   Visual Acuity Review  Right Eye Distance:   Left Eye Distance:   Bilateral Distance:    Right Eye Near:   Left Eye Near:    Bilateral Near:         MDM   1. Viral syndrome        Alene MiresJennifer C Mazi Schuff, NP 08/29/16 1408

## 2016-08-29 NOTE — ED Triage Notes (Signed)
Patient presents with possible flu-like symptoms, he has been experiencing cough, chills, green production with cough x1 week. Patient has been taking mucinex and Nyquil to treat symptoms

## 2016-09-02 DIAGNOSIS — G4733 Obstructive sleep apnea (adult) (pediatric): Secondary | ICD-10-CM | POA: Diagnosis not present

## 2016-09-15 DIAGNOSIS — G4733 Obstructive sleep apnea (adult) (pediatric): Secondary | ICD-10-CM | POA: Diagnosis not present

## 2016-09-16 ENCOUNTER — Other Ambulatory Visit: Payer: Self-pay | Admitting: *Deleted

## 2016-09-16 DIAGNOSIS — G4733 Obstructive sleep apnea (adult) (pediatric): Secondary | ICD-10-CM

## 2016-10-21 ENCOUNTER — Encounter: Payer: Self-pay | Admitting: Internal Medicine

## 2016-10-21 ENCOUNTER — Ambulatory Visit (INDEPENDENT_AMBULATORY_CARE_PROVIDER_SITE_OTHER): Payer: PPO | Admitting: Internal Medicine

## 2016-10-21 VITALS — BP 120/62 | HR 81 | Ht 70.5 in | Wt 166.8 lb

## 2016-10-21 DIAGNOSIS — G4733 Obstructive sleep apnea (adult) (pediatric): Secondary | ICD-10-CM

## 2016-10-21 DIAGNOSIS — F5101 Primary insomnia: Secondary | ICD-10-CM

## 2016-10-21 MED ORDER — CLONAZEPAM 0.5 MG PO TABS: ORAL_TABLET | ORAL | 5 refills | Status: DC

## 2016-10-21 NOTE — Assessment & Plan Note (Signed)
Hopefully better control of sleep arousals related to OSA will also address his complaint of repeated waking at night. Plan-start AutoPap 5-20 with educational discussion done.

## 2016-10-21 NOTE — Patient Instructions (Signed)
Script refilled for clonazepam to take 2 about 20 minutes before bedtime  Order- new DME, new CPAP auto 5-2-, mask of choice, humidifier, supplies, AirView   Dx OSA  Please call as needed

## 2016-10-21 NOTE — Assessment & Plan Note (Signed)
We discussed sleep quality. He will take him a while to get used to wearing CPAP before we can assess whether that addresses his nocturnal waking. Plan-I suggested to follow initial instruction and try clonazepam 0.5 mg 2 for sleep.

## 2016-10-21 NOTE — Progress Notes (Signed)
07/27/2016- 70 year old male former smoker referred courtesy of Dr. Jonny RuizJohn for OSA complicated by insomnia. Last seen by Dr. Vassie LollAlva in 2015 for follow-up of OSA and sleep onset insomnia. Some benefit with Ambien. He was advised to use melatonin and to reduce alcohol.  FOLLOW FOR: Sleep Apnea.  Sleep Study done 2014; wakes up a lot in the middle of the night; snoring; witnessed apneas.  ES: 9 NPSG 10/31/13- AHI 52/hour, desaturation to 83%, body weight 165 pounds. He had sleep study but never went for CPAP, afraid that it would aggravate his insomnia. A friend of his, successful with CPAP, has now convinced him to try it. His girlfriend tells him he snores very loudly. Despite Ambien 1/210 mg 2 or 3 times weekly, he only sleeps 4-6 hours with waking after sleep onset for 5 times per night for bathroom and just restless. He does have some leg movement-unclear how much. Occasional daytime sleepiness but no naps. 3 or 4 cups of coffee in the morning. No ENT surgery. He denies heart or lung disease but admits occasional dyspnea on exertion. 3 or 4 beers most days.  10/21/2016-70 year old male former smoker followed for OSA, insomnia Unattended Home Sleep Test 09/02/2016- AHI 9.4/hour, desaturation to 87%, body weight 163 pounds CPAP auto 5-20/Advanced FOLLOWS FOR: Review HST with patient. Mild obstructive sleep apnea as discussed with him. He has a few remaining teeth with mostly denture/partial plate which would not work well for an oral appliance. He is concerned that he wakes repeatedly at night. Clonazepam 0.5 mg insufficient-he never tried 2 tablets is suggested last visit.  ROS-see HPI   Negative unless "+" Constitutional:    weight loss, night sweats, fevers, chills, +fatigue, lassitude. HEENT:    headaches, difficulty swallowing, tooth/dental problems, sore throat,       sneezing, itching, ear ache, nasal congestion, post nasal drip, snoring CV:    chest pain, orthopnea, PND, swelling in lower  extremities, anasarca,                                              dizziness, palpitations Resp:   shortness of breath with exertion or at rest.                productive cough,   non-productive cough, coughing up of blood.              change in color of mucus.  wheezing.   Skin:    rash or lesions. GI:  No-   heartburn, indigestion, abdominal pain, nausea, vomiting, diarrhea,                 change in bowel habits, loss of appetite GU: dysuria, change in color of urine, no urgency or frequency.   flank pain. MS:   joint pain, stiffness, decreased range of motion, back pain. Neuro-     nothing unusual Psych:  change in mood or affect.  depression or anxiety.   memory loss.  OBJ- Physical Exam General- Alert, Oriented, Affect-appropriate, Distress- none acute, + not obese Skin- rash-none, lesions- none, excoriation- none Lymphadenopathy- none Head- atraumatic            Eyes- Gross vision intact, PERRLA, conjunctivae and secretions clear            Ears- Hearing, canals-normal            Nose- Clear,  no-Septal dev, mucus, polyps, erosion, perforation             Throat- Mallampati III-IV , mucosa clear , drainage- none, tonsils- atrophic Neck- flexible , trachea midline, no stridor , thyroid nl, carotid no bruit Chest - symmetrical excursion , unlabored           Heart/CV- RRR , no murmur , no gallop  , no rub, nl s1 s2                           - JVD- none , edema- none, stasis changes- none, varices- none           Lung- clear to P&A, wheeze- none, cough- none , dullness-none, rub- none           Chest wall-  Abd-  Br/ Gen/ Rectal- Not done, not indicated Extrem- cyanosis- none, clubbing, none, atrophy- none, strength- nl Neuro- grossly intact to observation

## 2016-11-18 DIAGNOSIS — G4733 Obstructive sleep apnea (adult) (pediatric): Secondary | ICD-10-CM | POA: Diagnosis not present

## 2016-12-16 ENCOUNTER — Encounter: Payer: Self-pay | Admitting: Internal Medicine

## 2016-12-16 ENCOUNTER — Ambulatory Visit (INDEPENDENT_AMBULATORY_CARE_PROVIDER_SITE_OTHER): Payer: PPO | Admitting: Internal Medicine

## 2016-12-16 VITALS — BP 140/80 | HR 102 | Temp 98.8°F | Resp 20 | Wt 170.0 lb

## 2016-12-16 DIAGNOSIS — R062 Wheezing: Secondary | ICD-10-CM | POA: Diagnosis not present

## 2016-12-16 DIAGNOSIS — R05 Cough: Secondary | ICD-10-CM | POA: Diagnosis not present

## 2016-12-16 DIAGNOSIS — R7302 Impaired glucose tolerance (oral): Secondary | ICD-10-CM | POA: Diagnosis not present

## 2016-12-16 DIAGNOSIS — R059 Cough, unspecified: Secondary | ICD-10-CM

## 2016-12-16 MED ORDER — HYDROCODONE-HOMATROPINE 5-1.5 MG/5ML PO SYRP: 5.0000 mL | ORAL_SOLUTION | Freq: Four times a day (QID) | ORAL | 0 refills | Status: AC | PRN

## 2016-12-16 MED ORDER — PREDNISONE 10 MG PO TABS: ORAL_TABLET | ORAL | 0 refills | Status: DC

## 2016-12-16 MED ORDER — AZITHROMYCIN 250 MG PO TABS
ORAL_TABLET | ORAL | 1 refills | Status: DC
Start: 2016-12-16 — End: 2017-01-19

## 2016-12-16 NOTE — Progress Notes (Signed)
Subjective:    Patient ID: Ryan Diaz, male    DOB: [birth year], 71 y.o.   MRN: [id number]  HPI  Here with acute onset mild to mod 2-3 days ST, HA, general weakness and malaise, with prod cough greenish sputum, but Pt denies chest pain, increased sob or doe, wheezing, orthopnea, PND, increased LE swelling, palpitations, dizziness or syncope, except for mild wheezing/sob since last PM, prompting his visit  Pt denies new neurological symptoms such as new headache, or facial or extremity weakness or numbness   Pt denies polydipsia, polyuria,  Past Medical History:  Diagnosis Date  . 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 ALLERGIC RHINITIS 08/08/2007  . ANXIETY 08/08/2007  . COPD 10/03/2009  . DEPRESSION 08/08/2007  . ERECTILE DYSFUNCTION 08/08/2007  . GENITAL HERPES, HX OF 08/08/2007  . GERD 08/08/2007  . GLUCOSE INTOLERANCE 08/08/2007  . HYPERLIPIDEMIA 08/08/2007  . HYPERTENSION 08/08/2007  . Impaired glucose tolerance 09/26/2011  . Insomnia 05/18/2012  . PAIN IN SOFT TISSUES OF LIMB 10/02/2009   No past surgical history on file.  reports that he quit smoking about 7 years ago. His smoking use included Cigarettes. He has a 52.50 pack-year smoking history. He has never used smokeless tobacco. He reports that he drinks about 9.0 oz of alcohol per week . He reports that he does not use drugs. family history includes Colon polyps in his brother. No Known Allergies Current Outpatient Prescriptions on File Prior to Visit  Medication Sig Dispense Refill  . albuterol (PROVENTIL HFA;VENTOLIN HFA) 108 (90 BASE) MCG/ACT inhaler Inhale 2 puffs into the lungs every 6 (six) hours as needed for wheezing or shortness of breath.    . citalopram (CELEXA) 20 MG tablet Take 1 tablet (20 mg total) by mouth daily. 90 tablet 3  . clonazePAM (KLONOPIN) 0.5 MG tablet 1 or 2 for sleep if needed 60 tablet 5  . Diclofenac Sodium (PENNSAID) 2 % SOLN Place 2 application onto the skin 2 (two) times daily. 112 g 3  .  fluticasone (FLONASE) 50 MCG/ACT nasal spray Place 2 sprays into both nostrils daily. 16 g 2  . lisinopril (PRINIVIL,ZESTRIL) 10 MG tablet Take 1 tablet (10 mg total) by mouth daily. 90 tablet 3  . omeprazole (PRILOSEC) 20 MG capsule Take 1 capsule (20 mg total) by mouth 2 (two) times daily. 90 capsule 3  . pravastatin (PRAVACHOL) 80 MG tablet Take 1 tablet (80 mg total) by mouth daily. 90 tablet 3  . terazosin (HYTRIN) 2 MG capsule Take 1 capsule (2 mg total) by mouth at bedtime. 90 capsule 3  . tiotropium (SPIRIVA) 18 MCG inhalation capsule Place 18 mcg into inhaler and inhale daily.    Marland Kitchen zolpidem (AMBIEN) 10 MG tablet TAKE ONE TABLET BY MOUTH ONCE DAILY AT BEDTIME AS NEEDED FOR SLEEP 90 tablet 1   No current facility-administered medications on file prior to visit.     Review of Systems  Constitutional: Negative for unusual diaphoresis or night sweats HENT: Negative for ear swelling or discharge Eyes: Negative for worsening visual haziness  Respiratory: Negative for choking and stridor.   Gastrointestinal: Negative for distension or worsening eructation Genitourinary: Negative for retention or change in urine volume.  Musculoskeletal: Negative for other MSK pain or swelling Skin: Negative for color change and worsening wound Neurological: Negative for tremors and numbness other than noted  Psychiatric/Behavioral: Negative for decreased concentration or agitation other than above   All other system neg     Objective:   Physical  Exam BP 140/80   Pulse (!) 102   Temp 98.8 F (37.1 C) (Oral)   Resp 20   Wt 170 lb (77.1 kg)   SpO2 95%   BMI 24.05 kg/m  VS noted, mild ill Constitutional: Pt appears in no apparent distress HENT: Head: NCAT.  Right Ear: External ear normal.  Left Ear: External ear normal.  Bilat tm's with mild erythema.  Max sinus areas non tender.  Pharynx with mild erythema, no exudate Eyes: . Pupils are equal, round, and reactive to light. Conjunctivae and EOM  are normal Neck: Normal range of motion. Neck supple. with bilat submandibular LA Cardiovascular: Normal rate and regular rhythm.   Pulmonary/Chest: Effort normal and breath sounds decreased without rales but with few bilat scattered wheezing.  Neurological: Pt is alert. Not confused , motor grossly intact Skin: Skin is warm. No rash, no LE edema Psychiatric: Pt behavior is normal. No agitation.  No other new exam findings    Assessment & Plan:

## 2016-12-16 NOTE — Progress Notes (Signed)
Pre visit review using our clinic review tool, if applicable. No additional management support is needed unless otherwise documented below in the visit note. 

## 2016-12-16 NOTE — Assessment & Plan Note (Signed)
Mild to mod, for depomedrol IM 80, predpac asd, to f/u any worsening symptoms or concerns 

## 2016-12-16 NOTE — Patient Instructions (Signed)
You had the steroid shot today  Please take all new medication as prescribed - the antibiotic, cough medicine if needed, and prednisone  Please continue all other medications as before, and refills have been done if requested.  Please have the pharmacy call with any other refills you may need.  Please keep your appointments with your specialists as you may have planned   

## 2016-12-16 NOTE — Assessment & Plan Note (Signed)
Mild to mod,c/w bronchitis vs pna, for antibx course, cough med prn, declines cxr, to f/u any worsening symptoms or concerns 

## 2016-12-16 NOTE — Assessment & Plan Note (Addendum)
stable overall by history and exam, recent data reviewed with pt, and pt to continue medical treatment as before,  to f/u any worsening symptoms or concerns Lab Results  Component Value Date   HGBA1C 5.8 04/10/2014   Pt to call for cbg > 200 or onset polys with tx

## 2016-12-17 NOTE — Progress Notes (Addendum)
Subjective:   Ryan Diaz is a 71 y.o. male who presents for an Initial Medicare Annual Wellness Visit.  Review of Systems  No ROS.  Medicare Wellness Visit.  Cardiac Risk Factors include: advanced age (>2655men, 38>65 women);male gender;hypertension;dyslipidemia Sleep patterns:   gets up 3-4 times nightly to void and sleeps 6 hours nightly.  feels rested. Wears CPAP nightly   Home Safety/Smoke Alarms: Feels safe in home. Smoke alarms in place.    Living environment; residence and Firearm Safety: 1-story house/ trailer, no firearms. Lives alone. Seat Belt Safety/Bike Helmet: Wears seat belt.   Counseling:   Eye Exam- Last 02/2016, goes to [facility]- Does not follow a dentist regularly   Male:   CCS-  12/23/11, mild diverticulosis, hemorrhoids. Recall 10 years  PSA-  Lab Results  Component Value Date   PSA 0.75 02/14/2016   PSA 1.56 04/23/2015   PSA 1.37 04/10/2014       Objective:    Today's Vitals   12/18/16 1134 12/18/16 1139  BP: 124/62   Pulse: 80   SpO2: 98%   Weight: 165 lb (74.8 kg)   Height: 5\' 11"  (1.803 m)   PainSc:  4    Body mass index is 23.01 kg/m.  Current Medications (verified) Outpatient Encounter Prescriptions as of 12/18/2016  Medication Sig  . albuterol (PROVENTIL HFA;VENTOLIN HFA) 108 (90 BASE) MCG/ACT inhaler Inhale 2 puffs into the lungs every 6 (six) hours as needed for wheezing or shortness of breath.  Marland Kitchen. azithromycin (ZITHROMAX Z-PAK) 250 MG tablet 2 tab by mouth day 1, then 1 per day  . clonazePAM (KLONOPIN) 0.5 MG tablet 1 or 2 for sleep if needed  . fluticasone (FLONASE) 50 MCG/ACT nasal spray Place 2 sprays into both nostrils daily.  Marland Kitchen. HYDROcodone-homatropine (HYCODAN) 5-1.5 MG/5ML syrup Take 5 mLs by mouth every 6 (six) hours as needed for cough.  Marland Kitchen. lisinopril (PRINIVIL,ZESTRIL) 10 MG tablet Take 1 tablet (10 mg total) by mouth daily.  Marland Kitchen. omeprazole (PRILOSEC) 20 MG capsule Take 1 capsule (20 mg total) by mouth 2 (two) times daily.  .  pravastatin (PRAVACHOL) 80 MG tablet Take 1 tablet (80 mg total) by mouth daily.  . predniSONE (DELTASONE) 10 MG tablet 2 tabs by mouth per day for 5 days  . terazosin (HYTRIN) 2 MG capsule Take 1 capsule (2 mg total) by mouth at bedtime.  Marland Kitchen. tiotropium (SPIRIVA) 18 MCG inhalation capsule Place 18 mcg into inhaler and inhale daily.  Marland Kitchen. zolpidem (AMBIEN) 10 MG tablet TAKE ONE TABLET BY MOUTH ONCE DAILY AT BEDTIME AS NEEDED FOR SLEEP  . citalopram (CELEXA) 20 MG tablet Take 1 tablet (20 mg total) by mouth daily. (Patient not taking: Reported on 12/18/2016)  . [DISCONTINUED] Diclofenac Sodium (PENNSAID) 2 % SOLN Place 2 application onto the skin 2 (two) times daily. (Patient not taking: Reported on 12/18/2016)   No facility-administered encounter medications on file as of 12/18/2016.     Allergies (verified) Patient has no known allergies.   History: Past Medical History:  Diagnosis Date  . 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 ALLERGIC RHINITIS 08/08/2007  . ANXIETY 08/08/2007  . COPD 10/03/2009  . DEPRESSION 08/08/2007  . ERECTILE DYSFUNCTION 08/08/2007  . GENITAL HERPES, HX OF 08/08/2007  . GERD 08/08/2007  . GLUCOSE INTOLERANCE 08/08/2007  . HYPERLIPIDEMIA 08/08/2007  . HYPERTENSION 08/08/2007  . Impaired glucose tolerance 09/26/2011  . Insomnia 05/18/2012  . PAIN IN SOFT TISSUES OF LIMB 10/02/2009   History reviewed. No pertinent  surgical history. Family History  Problem Relation Age of Onset  . Colon polyps Brother    Social History   Occupational History  . self employed auto body work    Social History Main Topics  . Smoking status: Former Smoker    Packs/day: 1.50    Years: 35.00    Types: Cigarettes    Quit date: 09/04/2009  . Smokeless tobacco: Never Used  . Alcohol use 27.6 - 31.8 oz/week    18 Standard drinks or equivalent, 28 - 35 Cans of beer per week     Comment: everyday drinker amount varies   . Drug use: Yes    Types: Marijuana     Comment: marijuana occassionally    . Sexual activity: No   Tobacco Counseling Counseling given: Not Answered   Activities of Daily Living In your present state of health, do you have any difficulty performing the following activities: 12/18/2016  Hearing? N  Vision? N  Difficulty concentrating or making decisions? Y  Walking or climbing stairs? N  Dressing or bathing? N  Doing errands, shopping? N  Preparing Food and eating ? N  Using the Toilet? N  In the past six months, have you accidently leaked urine? Y  Do you have problems with loss of bowel control? N  Managing your Medications? N  Managing your Finances? N  Housekeeping or managing your Housekeeping? N  Some recent data might be hidden    Immunizations and Health Maintenance Immunization History  Administered Date(s) Administered  . Influenza Split 08/27/2013  . Influenza Whole 08/17/2005, 09/16/2009  . Influenza,inj,Quad PF,36+ Mos 07/27/2016  . Pneumococcal Conjugate-13 04/10/2014  . Pneumococcal Polysaccharide-23 08/19/2006  . Zoster 08/19/2006   Health Maintenance Due  Topic Date Due  . PNA vac Low Risk Adult (2 of 2 - PPSV23) 04/11/2015    Patient Care Team: Corwin Levins, MD as PCP - General Waymon Budge, MD as Consulting Physician (Pulmonary Disease)  Indicate any recent Medical Services you may have received from other than [facility] providers in the past year (date may be approximate).    Assessment:   This is a routine wellness examination for Ryan Diaz. Physical assessment deferred to PCP.   Hearing/Vision screen Hearing Screening Comments: Hears heart beat in ear at night while lying down. Passed whisper test. Vision Screening Comments: Needs readers, wears glasses  Dietary issues and exercise activities discussed: Current Exercise Habits: The patient does not participate in regular exercise at present (active, working, runs errands), Exercise limited by: respiratory conditions(s)  Diet (meal preparation, eat out, water intake,  caffeinated beverages, dairy products, fruits and vegetables): water 16 ounce bottle, milk, coffee x 5 cups daily, beer a few daily Breakfast: bacon, grits, waffle, soup, sausage Lunch: sandwich, left-overs, hotdogs Dinner:   [employer], meatloaf, salad, vegetables Discussed eating a healthier diet and increasing water intake  Goals    . decrease the amount of beer I drink          Plan to have no more than 5 beers daily.       Depression Screen PHQ 2/9 Scores 12/18/2016 02/12/2016 04/23/2015 04/10/2014  PHQ - 2 Score 0 0 0 0    Fall Risk Fall Risk  12/18/2016 02/12/2016 04/23/2015 04/10/2014 07/06/2013  Falls in the past year? No No No No No    Cognitive Function: MMSE - Mini Mental State Exam 12/18/2016  Orientation to time 5  Orientation to Place 5  Registration 3  Attention/ Calculation 5  Recall 3  Language- name 2 objects 2  Language- repeat 1  Language- follow 3 step command 3  Language- read & follow direction 1  Write a sentence 1  Copy design 1  Total score 30        Screening Tests Health Maintenance  Topic Date Due  . PNA vac Low Risk Adult (2 of 2 - PPSV23) 04/11/2015  . COLONOSCOPY  12/22/2021  . TETANUS/TDAP  04/16/2022  . INFLUENZA VACCINE  Addressed  . ZOSTAVAX  Completed  . Hepatitis C Screening  Completed        Plan:    Continue to eat heart healthy diet (full of fruits, vegetables, whole grains, lean protein, water--limit salt, fat, and sugar intake) and increase physical activity as tolerated.  Continue doing brain stimulating activities (puzzles, reading, adult coloring books, staying active) to keep memory sharp.   Gave patient dentist resource handout  Pt currently taking atbx. Pneumovax deferred to CPE.   During the course of the visit Viktor was educated and counseled about the following appropriate screening and preventive services:   Vaccines to include Pneumoccal, Influenza, Hepatitis B, Td, Zostavax, HCV  Colorectal cancer  screening  Cardiovascular disease screening  Diabetes screening  Glaucoma screening  Nutrition counseling  Prostate cancer screening  Patient Instructions (the written plan) were given to the patient.   Wanda Plump, RN   12/18/2016   Medical screening examination/treatment/procedure(s) were performed by non-physician practitioner and as supervising physician I was immediately available for consultation/collaboration. I agree with above. Oliver Barre, MD

## 2016-12-17 NOTE — Progress Notes (Signed)
Pre visit review using our clinic review tool, if applicable. No additional management support is needed unless otherwise documented below in the visit note. 

## 2016-12-18 ENCOUNTER — Ambulatory Visit (INDEPENDENT_AMBULATORY_CARE_PROVIDER_SITE_OTHER): Payer: PPO | Admitting: *Deleted

## 2016-12-18 VITALS — BP 124/62 | HR 80 | Ht 71.0 in | Wt 165.0 lb

## 2016-12-18 DIAGNOSIS — Z0000 Encounter for general adult medical examination without abnormal findings: Secondary | ICD-10-CM | POA: Diagnosis not present

## 2016-12-18 NOTE — Patient Instructions (Addendum)
Eat heart healthy diet (full of fruits, vegetables, whole grains, lean protein, water--limit salt, fat, and sugar intake) and increase physical activity as tolerated.    Fat and Cholesterol Restricted Diet High levels of fat and cholesterol in your blood may lead to various health problems, such as diseases of the heart, blood vessels, gallbladder, liver, and pancreas. Fats are concentrated sources of energy that come in various forms. Certain types of fat, including saturated fat, may be harmful in excess. Cholesterol is a substance needed by your body in small amounts. Your body makes all the cholesterol it needs. Excess cholesterol comes from the food you eat. When you have high levels of cholesterol and saturated fat in your blood, health problems can develop because the excess fat and cholesterol will gather along the walls of your blood vessels, causing them to narrow. Choosing the right foods will help you control your intake of fat and cholesterol. This will help keep the levels of these substances in your blood within normal limits and reduce your risk of disease. What is my plan? Your health care provider recommends that you:  Limit your fat intake to ______% or less of your total calories per day.  Limit the amount of cholesterol in your diet to less than _________mg per day.  Eat 20-30 grams of fiber each day. What types of fat should I choose?  Choose healthy fats more often. Choose monounsaturated and polyunsaturated fats, such as olive and canola oil, flaxseeds, walnuts, almonds, and seeds.  Eat more omega-3 fats. Good choices include salmon, mackerel, sardines, tuna, flaxseed oil, and ground flaxseeds. Aim to eat fish at least two times a week.  Limit saturated fats. Saturated fats are primarily found in animal products, such as meats, butter, and cream. Plant sources of saturated fats include palm oil, palm kernel oil, and coconut oil.  Avoid foods with partially hydrogenated  oils in them. These contain trans fats. Examples of foods that contain trans fats are stick margarine, some tub margarines, cookies, crackers, and other baked goods. What general guidelines do I need to follow? These guidelines for healthy eating will help you control your intake of fat and cholesterol:  Check food labels carefully to identify foods with trans fats or high amounts of saturated fat.  Fill one half of your plate with vegetables and green salads.  Fill one fourth of your plate with whole grains. Look for the word "whole" as the first word in the ingredient list.  Fill one fourth of your plate with lean protein foods.  Limit fruit to two servings a day. Choose fruit instead of juice.  Eat more foods that contain fiber, such as apples, broccoli, carrots, beans, peas, and barley.  Eat more home-cooked food and less restaurant, buffet, and fast food.  Limit or avoid alcohol.  Limit foods high in starch and sugar.  Limit fried foods.  Cook foods using methods other than frying. Baking, boiling, grilling, and broiling are all great options.  Lose weight if you are overweight. Losing just 5-10% of your initial body weight can help your overall health and prevent diseases such as diabetes and heart disease. What foods can I eat? Grains  Whole grains, such as whole wheat or whole grain breads, crackers, cereals, and pasta. Unsweetened oatmeal, bulgur, barley, quinoa, or brown rice. Corn or whole wheat flour tortillas. Vegetables  Fresh or frozen vegetables (raw, steamed, roasted, or grilled). Green salads. Fruits  All fresh, canned (in natural juice), or frozen fruits. Meats  and other protein foods  Ground beef (85% or leaner), grass-fed beef, or beef trimmed of fat. Skinless chicken or [place]. Ground chicken or [place]. Pork trimmed of fat. All fish and seafood. Eggs. Dried beans, peas, or lentils. Unsalted nuts or seeds. Unsalted canned or dry beans. Dairy  Low-fat  dairy products, such as skim or 1% milk, 2% or reduced-fat cheeses, low-fat ricotta or cottage cheese, or plain low-fat yo Fats and oils  Tub margarines without trans fats. Light or reduced-fat mayonnaise and salad dressings. Avocado. Olive, canola, sesame, or safflower oils. Natural peanut or almond butter (choose ones without added sugar and oil). The items listed above may not be a complete list of recommended foods or beverages. Contact your dietitian for more options.  Foods to avoid Grains  [place] bread. [place] pasta. Levels rice. Cornbread. Bagels, pastries, and croissants. Crackers that contain trans fat. Vegetables  [place]y potatoes. Corn. Creamed or fried vegetables. Vegetables in a cheese sauce. Fruits  Dried fruits. Canned fruit in light or heavy syrup. Fruit juice. Meats and other protein foods  Fatty cuts of meat. Ribs, chicken wings, bacon, sausage, bologna, salami, chitterlings, fatback, hot dogs, bratwurst, and packaged luncheon meats. Liver and organ meats. Dairy  Whole or 2% milk, cream, half-and-half, and cream cheese. Whole milk cheeses. Whole-fat or sweetened yogurt. Full-fat cheeses. Nondairy creamers and whipped toppings. Processed cheese, cheese spreads, or cheese curds. Beverages  Alcohol. Sweetened drinks (such as sodas, lemonade, and fruit drinks or punches). Fats and oils  Butter, stick margarine, lard, shortening, ghee, or bacon fat. Coconut, palm kernel, or palm oils. Sweets and desserts  Corn syrup, sugars, honey, and molasses. Candy. Jam and jelly. Syrup. Sweetened cereals. Cookies, pies, cakes, donuts, muffins, and ice cream. The items listed above may not be a complete list of foods and beverages to avoid. Contact your dietitian for more information.  This information is not intended to replace advice given to you by your health care provider. Make sure you discuss any questions you have with your health care provider. Document Released: 11/02/2005  Document Revised: 11/23/2014 Document Reviewed: 01/31/2014 Elsevier Interactive Patient Education  2017 [employer]  Health Maintenance, Male A healthy lifestyle and preventative care can promote health and wellness.  Maintain regular health, dental, and eye exams.  Eat a healthy diet. Foods like vegetables, fruits, whole grains, low-fat dairy products, and lean protein foods contain the nutrients you need and are low in calories. Decrease your intake of foods high in solid fats, added sugars, and salt. Get information about a proper diet from your health care provider, if necessary.  Regular physical exercise is one of the most important things you can do for your health. Most adults should get at least 150 minutes of moderate-intensity exercise (any activity that increases your heart rate and causes you to sweat) each week. In addition, most adults need muscle-strengthening exercises on 2 or more days a week.   Maintain a healthy weight. The body mass index (BMI) is a screening tool to identify possible weight problems. It provides an estimate of body fat based on height and weight. Your health care provider can find your BMI and can help you achieve or maintain a healthy weight. For males 20 years and older:  A BMI below 18.5 is considered underweight.  A BMI of 18.5 to 24.9 is normal.  A BMI of 25 to 29.9 is considered overweight.  A BMI of 30 and above is considered obese.  Maintain normal blood lipids and  cholesterol by exercising and minimizing your intake of saturated fat. Eat a balanced diet with plenty of fruits and vegetables. Blood tests for lipids and cholesterol should begin at age 71 and be repeated every 5 years. If your lipid or cholesterol levels are high, you are over age 71, or you are at high risk for heart disease, you may need your cholesterol levels checked more frequently.Ongoing high lipid and cholesterol levels should be treated with medicines if diet and  exercise are not working.  If you smoke, find out from your health care provider how to quit. If you do not use tobacco, do not start.  Lung cancer screening is recommended for adults aged 55-80 years who are at high risk for developing lung cancer because of a history of smoking. A yearly low-dose CT scan of the lungs is recommended for people who have at least a 30-pack-year history of smoking and are current smokers or have quit within the past 15 years. A pack year of smoking is smoking an average of 1 pack of cigarettes a day for 1 year (for example, a 30-pack-year history of smoking could mean smoking 1 pack a day for 30 years or 2 packs a day for 15 years). Yearly screening should continue until the smoker has stopped smoking for at least 15 years. Yearly screening should be stopped for people who develop a health problem that would prevent them from having lung cancer treatment.  If you choose to drink alcohol, do not have more than 2 drinks per day. One drink is considered to be 12 oz (360 mL) of beer, 5 oz (150 mL) of wine, or 1.5 oz (45 mL) of liquor.  Avoid the use of street drugs. Do not share needles with anyone. Ask for help if you need support or instructions about stopping the use of drugs.  High blood pressure causes heart disease and increases the risk of stroke. High blood pressure is more likely to develop in:  People who have blood pressure in the end of the normal range (100-139/85-89 mm Hg).  People who are overweight or obese.  People who are African American.  If you are 10718-71 years of age, have your blood pressure checked every 3-5 years. If you are 71 years of age or older, have your blood pressure checked every year. You should have your blood pressure measured twice-once when you are at a hospital or clinic, and once when you are not at a hospital or clinic. Record the average of the two measurements. To check your blood pressure when you are not at a hospital or  clinic, you can use:  An automated blood pressure machine at a pharmacy.  A home blood pressure monitor.  If you are 6045-71 years old, ask your health care provider if you should take aspirin to prevent heart disease.  Diabetes screening involves taking a blood sample to check your fasting blood sugar level. This should be done once every 3 years after age 71 if you are at a normal weight and without risk factors for diabetes. Testing should be considered at a younger age or be carried out more frequently if you are overweight and have at least 1 risk factor for diabetes.  Colorectal cancer can be detected and often prevented. Most routine colorectal cancer screening begins at the age of 71 and continues through age 71. However, your health care provider may recommend screening at an earlier age if you have risk factors for colon cancer. On  a yearly basis, your health care provider may provide home test kits to check for hidden blood in the stool. A small camera at the end of a tube may be used to directly examine the colon (sigmoidoscopy or colonoscopy) to detect the earliest forms of colorectal cancer. Talk to your health care provider about this at age 13 when routine screening begins. A direct exam of the colon should be repeated every 5-10 years through age 28, unless early forms of precancerous polyps or small growths are found.  People who are at an increased risk for hepatitis B should be screened for this virus. You are considered at high risk for hepatitis B if:  You were born in a country where hepatitis B occurs often. Talk with your health care provider about which countries are considered high risk.  Your parents were born in a high-risk country and you have not received a shot to protect against hepatitis B (hepatitis B vaccine).  You have HIV or AIDS.  You use needles to inject street drugs.  You live with, or have sex with, someone who has hepatitis B.  You are a man who has  sex with other men (MSM).  You get hemodialysis treatment.  You take certain medicines for conditions like cancer, organ transplantation, and autoimmune conditions.  Hepatitis C blood testing is recommended for all people born from [birth year] through 1965 and any individual with known risk factors for hepatitis C.  Healthy men should no longer receive prostate-specific antigen (PSA) blood tests as part of routine cancer screening. Talk to your health care provider about prostate cancer screening.  Testicular cancer screening is not recommended for adolescents or adult males who have no symptoms. Screening includes self-exam, a health care provider exam, and other screening tests. Consult with your health care provider about any symptoms you have or any concerns you have about testicular cancer.  Practice safe sex. Use condoms and avoid high-risk sexual practices to reduce the spread of sexually transmitted infections (STIs).  You should be screened for STIs, including gonorrhea and chlamydia if:  You are sexually active and are younger than 24 years.  You are older than 24 years, and your health care provider tells you that you are at risk for this type of infection.  Your sexual activity has changed since you were last screened, and you are at an increased risk for chlamydia or gonorrhea. Ask your health care provider if you are at risk.  If you are at risk of being infected with HIV, it is recommended that you take a prescription medicine daily to prevent HIV infection. This is called pre-exposure prophylaxis (PrEP). You are considered at risk if:  You are a man who has sex with other men (MSM).  You are a heterosexual man who is sexually active with multiple partners.  You take drugs by injection.  You are sexually active with a partner who has HIV.  Talk with your health care provider about whether you are at high risk of being infected with HIV. If you choose to begin PrEP, you should  first be tested for HIV. You should then be tested every 3 months for as long as you are taking PrEP.  Use sunscreen. Apply sunscreen liberally and repeatedly throughout the day. You should seek shade when your shadow is shorter than you. Protect yourself by wearing long sleeves, pants, a wide-brimmed hat, and sunglasses year round whenever you are outdoors.  Tell your health care provider of new moles  or changes in moles, especially if there is a change in shape or color. Also, tell your health care provider if a mole is larger than the size of a pencil eraser.  A one-time screening for abdominal aortic aneurysm (AAA) and surgical repair of large AAAs by ultrasound is recommended for men aged 65-75 years who are current or former smokers.  Stay current with your vaccines (immunizations). This information is not intended to replace advice given to you by your health care provider. Make sure you discuss any questions you have with your health care provider. Document Released: 04/30/2008 Document Revised: 11/23/2014 Document Reviewed: 08/06/2015 Elsevier Interactive Patient Education  2017 [employer].

## 2016-12-19 DIAGNOSIS — G4733 Obstructive sleep apnea (adult) (pediatric): Secondary | ICD-10-CM | POA: Diagnosis not present

## 2017-01-16 DIAGNOSIS — G4733 Obstructive sleep apnea (adult) (pediatric): Secondary | ICD-10-CM | POA: Diagnosis not present

## 2017-01-19 ENCOUNTER — Encounter: Payer: Self-pay | Admitting: Internal Medicine

## 2017-01-19 ENCOUNTER — Ambulatory Visit (INDEPENDENT_AMBULATORY_CARE_PROVIDER_SITE_OTHER): Payer: PPO | Admitting: Internal Medicine

## 2017-01-19 ENCOUNTER — Telehealth: Payer: Self-pay | Admitting: Internal Medicine

## 2017-01-19 VITALS — BP 120/76 | HR 84 | Ht 70.5 in | Wt 166.6 lb

## 2017-01-19 DIAGNOSIS — G4733 Obstructive sleep apnea (adult) (pediatric): Secondary | ICD-10-CM

## 2017-01-19 DIAGNOSIS — F10982 Alcohol use, unspecified with alcohol-induced sleep disorder: Secondary | ICD-10-CM

## 2017-01-19 MED ORDER — CLORAZEPATE DIPOTASSIUM 15 MG PO TABS: ORAL_TABLET | ORAL | 5 refills | Status: DC

## 2017-01-19 NOTE — Patient Instructions (Signed)
Script Tranxene to try INSTEAD of clonazepam, to try for sleep.   1/3 to a whole tab as needed for sleep  We can continue DME [facility], CPAP 5-20, mask of choice, humidifier, supplies, AirView    Dx OSA

## 2017-01-19 NOTE — Telephone Encounter (Signed)
Spoke with pt, who is requesting copy of sleep study to be mailed to address on file(verified with pt). Sleep study has been placed in outgoing mail. Nothing further needed.

## 2017-01-19 NOTE — Progress Notes (Signed)
HPI male former smoker followed for OSA, insomnia, complicated by ETOH, COPD, GERD, HBP NPSG 10/31/13- AHI 52/hour, desaturation to 83%, body weight 165 pounds Unattended Home Sleep Test 09/02/2016- AHI 9.4/hour, desaturation to 87%, body weight 163 pounds  -------------------------------------------------------------------------------------  10/21/2016-71 year old male former smoker followed for OSA, insomnia Unattended Home Sleep Test 09/02/2016- AHI 9.4/hour, desaturation to 87%, body weight 163 pounds CPAP auto 5-20/[facility] FOLLOWS FOR: Review HST with patient. Mild obstructive sleep apnea as discussed with him. He has a few remaining teeth with mostly denture/partial plate which would not work well for an oral appliance. He is concerned that he wakes repeatedly at night. Clonazepam 0.5 mg insufficient-he never tried 2 tablets as suggested last visit.  01/19/2017-71 year old male former smoker followed for OSA, insomnia, complicated by history EtOH, COPD, GERD, HBP CPAP auto 5-20/[facility] FOLLOWS FOR: XLK:GMWNUUVOME:[facility]; Pt wears CPAP just about every night and DL attached. No new supplies needed at this time.  He says some nights "if I drink" he will fall asleep without his CPAP. Says his auto titration pressure range 5-20 is comfortable Download-adequate compliance 70% 4 hour, used 80% of days, AHI 6.3/hour. We reviewed his download report together and discussed compliance. He says he can't sleep without clonazepam  ROS-see HPI   Negative unless "+" Constitutional:    weight loss, night sweats, fevers, chills, +fatigue, lassitude. HEENT:    headaches, difficulty swallowing, tooth/dental problems, sore throat,       sneezing, itching, ear ache, nasal congestion, post nasal drip, snoring CV:    chest pain, orthopnea, PND, swelling in lower extremities, anasarca,                                              dizziness, palpitations Resp:   shortness of breath with exertion or at rest.         productive cough,   non-productive cough, coughing up of blood.              change in color of mucus.  wheezing.   Skin:    rash or lesions. GI:  No-   heartburn, indigestion, abdominal pain, nausea, vomiting, diarrhea,                 change in bowel habits, loss of appetite GU: dysuria, change in color of urine, no urgency or frequency.   flank pain. MS:   joint pain, stiffness, decreased range of motion, back pain. Neuro-     nothing unusual Psych:  change in mood or affect.  depression or anxiety.   memory loss.  OBJ- Physical Exam                  stable baseline exam General- Alert, Oriented, Affect-appropriate, Distress- none acute, + not obese Skin- rash-none, lesions- none, excoriation- none Lymphadenopathy- none Head- atraumatic            Eyes- Gross vision intact, PERRLA, conjunctivae and secretions clear            Ears- Hearing, canals-normal            Nose- Clear, no-Septal dev, mucus, polyps, erosion, perforation             Throat- Mallampati III-IV , mucosa clear , drainage- none, tonsils- atrophic Neck- flexible , trachea midline, no stridor , thyroid nl, carotid no bruit Chest - symmetrical excursion , unlabored  Heart/CV- RRR , no murmur , no gallop  , no rub, nl s1 s2                           - JVD- none , edema- none, stasis changes- none, varices- none           Lung- clear to P&A, wheeze- none, cough- none , dullness-none, rub- none           Chest wall-  Abd-  Br/ Gen/ Rectal- Not done, not indicated Extrem- cyanosis- none, clubbing, none, atrophy- none, strength- nl Neuro- grossly intact to observation

## 2017-01-24 NOTE — Assessment & Plan Note (Signed)
I would like to see better compliance and I'm concerned that he says nights he doesn't wear his machine, he has been drinking and fell asleep without it.

## 2017-01-24 NOTE — Assessment & Plan Note (Signed)
He has dependent on clonazepam to help himself sleep. We discussed this and he has agreed to try Tranxene as an alternative

## 2017-01-24 NOTE — Assessment & Plan Note (Signed)
We have not been following him for his COPD and he does not report respiratory problems at this visit. At next opportunity he should have a spirometry and CXR

## 2017-01-28 ENCOUNTER — Ambulatory Visit: Payer: PPO | Admitting: Internal Medicine

## 2017-01-29 ENCOUNTER — Other Ambulatory Visit (INDEPENDENT_AMBULATORY_CARE_PROVIDER_SITE_OTHER): Payer: PPO

## 2017-01-29 ENCOUNTER — Ambulatory Visit (INDEPENDENT_AMBULATORY_CARE_PROVIDER_SITE_OTHER): Payer: PPO | Admitting: Internal Medicine

## 2017-01-29 ENCOUNTER — Encounter: Payer: Self-pay | Admitting: Internal Medicine

## 2017-01-29 VITALS — BP 140/78 | HR 109 | Temp 98.6°F | Ht 71.5 in | Wt 164.0 lb

## 2017-01-29 DIAGNOSIS — I10 Essential (primary) hypertension: Secondary | ICD-10-CM

## 2017-01-29 DIAGNOSIS — Z0000 Encounter for general adult medical examination without abnormal findings: Secondary | ICD-10-CM

## 2017-01-29 DIAGNOSIS — R7302 Impaired glucose tolerance (oral): Secondary | ICD-10-CM

## 2017-01-29 LAB — BASIC METABOLIC PANEL
BUN: 21 mg/dL (ref 6–23)
CO2: 28 mEq/L (ref 19–32)
Calcium: 10.1 mg/dL (ref 8.4–10.5)
Chloride: 103 mEq/L (ref 96–112)
Creatinine, Ser: 1.14 mg/dL (ref 0.40–1.50)
GFR: 81.45 mL/min (ref >60.00)
Glucose, Bld: 110 mg/dL — ABNORMAL HIGH (ref 70–99)
Potassium: 4.3 mEq/L (ref 3.5–5.1)
Sodium: 138 mEq/L (ref 135–145)

## 2017-01-29 LAB — TSH: TSH: 0.36 u[IU]/mL (ref 0.35–4.50)

## 2017-01-29 LAB — LIPID PANEL
Cholesterol: 157 mg/dL (ref 0–200)
HDL: 63 mg/dL (ref >39.00)
LDL Cholesterol: 60 mg/dL (ref 0–99)
NonHDL: 94.02
Total CHOL/HDL Ratio: 2
Triglycerides: 170 mg/dL — ABNORMAL HIGH (ref 0.0–149.0)
VLDL: 34 mg/dL (ref 0.0–40.0)

## 2017-01-29 LAB — CBC WITH DIFFERENTIAL/PLATELET
Basophils Absolute: 0.1 10*3/uL (ref 0.0–0.1)
Basophils Relative: 1.1 % (ref 0.0–3.0)
Eosinophils Absolute: 0.2 10*3/uL (ref 0.0–0.7)
Eosinophils Relative: 3 % (ref 0.0–5.0)
HCT: 39.2 % (ref 39.0–52.0)
Hemoglobin: 13.3 g/dL (ref 13.0–17.0)
Lymphocytes Relative: 27.6 % (ref 12.0–46.0)
Lymphs Abs: 1.7 10*3/uL (ref 0.7–4.0)
MCHC: 34 g/dL (ref 30.0–36.0)
MCV: 98.3 fl (ref 78.0–100.0)
Monocytes Absolute: 0.6 10*3/uL (ref 0.1–1.0)
Monocytes Relative: 9.8 % (ref 3.0–12.0)
Neutro Abs: 3.7 10*3/uL (ref 1.4–7.7)
Neutrophils Relative %: 58.5 % (ref 43.0–77.0)
Platelets: 358 10*3/uL (ref 150.0–400.0)
RBC: 3.99 Mil/uL — ABNORMAL LOW (ref 4.22–5.81)
RDW: 13.2 % (ref 11.5–15.5)
WBC: 6.3 10*3/uL (ref 4.0–10.5)

## 2017-01-29 LAB — HEPATIC FUNCTION PANEL
ALT: 33 U/L (ref 0–53)
AST: 26 U/L (ref 0–37)
Albumin: 4.4 g/dL (ref 3.5–5.2)
Alkaline Phosphatase: 79 U/L (ref 39–117)
Bilirubin, Direct: 0.1 mg/dL (ref 0.0–0.3)
Total Bilirubin: 0.3 mg/dL (ref 0.2–1.2)
Total Protein: 7 g/dL (ref 6.0–8.3)

## 2017-01-29 LAB — URINALYSIS, ROUTINE W REFLEX MICROSCOPIC
Bilirubin Urine: NEGATIVE
Hgb urine dipstick: NEGATIVE
Ketones, ur: NEGATIVE
Leukocytes, UA: NEGATIVE
Nitrite: NEGATIVE
Specific Gravity, Urine: 1.015 (ref 1.000–1.030)
Total Protein, Urine: NEGATIVE
Urine Glucose: NEGATIVE
Urobilinogen, UA: 0.2 (ref 0.0–1.0)
pH: 6.5 (ref 5.0–8.0)

## 2017-01-29 LAB — PSA: PSA: 1.21 ng/mL (ref 0.10–4.00)

## 2017-01-29 LAB — HEMOGLOBIN A1C: Hgb A1c MFr Bld: 5.8 % (ref 4.6–6.5)

## 2017-01-29 NOTE — Patient Instructions (Signed)
Your letter is done today  Please continue all other medications as before, and refills have been done if requested.  Please have the pharmacy call with any other refills you may need.  Please continue your efforts at being more active, low cholesterol diet, and weight control.  You are otherwise up to date with prevention measures today.  Please keep your appointments with your specialists as you may have planned  Please go to the LAB in the Basement (turn left off the elevator) for the tests to be done today  You will be contacted by phone if any changes need to be made immediately.  Otherwise, you will receive a letter about your results with an explanation, but please check with MyChart first.  Please remember to sign up for MyChart if you have not done so, as this will be important to you in the future with finding out test results, communicating by private email, and scheduling acute appointments online when needed.  Please return in 1 year for your yearly visit, or sooner if needed, with Lab testing done 3-5 days before

## 2017-01-29 NOTE — Progress Notes (Signed)
Subjective:    Patient ID: Ryan Diaz L Mroczkowski, male    DOB: 03/29/1946, 71 y.o.   MRN: [id number]  HPI  Here for wellness and f/u;  Overall doing ok;  Pt denies Chest pain, worsening SOB, DOE, wheezing, orthopnea, PND, worsening LE edema, palpitations, dizziness or syncope.  Pt denies neurological change such as new headache, facial or extremity weakness.  Pt denies polydipsia, polyuria, or low sugar symptoms. Pt states overall good compliance with treatment and medications, good tolerability, and has been trying to follow appropriate diet.  Pt denies worsening depressive symptoms, suicidal ideation or panic. No fever, night sweats, wt loss, loss of appetite, or other constitutional symptoms.  Pt states good ability with ADL's, has low fall risk, home safety reviewed and adequate, no other significant changes in hearing or vision, and only occasionally active with exercise.  No other new hx  [facility] Past Medical History:  Diagnosis Date  . 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 ALLERGIC RHINITIS 08/08/2007  . ANXIETY 08/08/2007  . Carpal tunnel syndrome   . COPD 10/03/2009  . DEPRESSION 08/08/2007  . ERECTILE DYSFUNCTION 08/08/2007  . GENITAL HERPES, HX OF 08/08/2007  . GERD 08/08/2007  . GLUCOSE INTOLERANCE 08/08/2007  . HYPERLIPIDEMIA 08/08/2007  . HYPERTENSION 08/08/2007  . Impaired glucose tolerance 09/26/2011  . Insomnia 05/18/2012  . PAIN IN SOFT TISSUES OF LIMB 10/02/2009   No past surgical history on file.  reports that he quit smoking about 7 years ago. His smoking use included Cigarettes. He has a 52.50 pack-year smoking history. He has never used smokeless tobacco. He reports that he drinks about 27.6 - 31.8 oz of alcohol per week . He reports that he uses drugs, including Marijuana. family history includes Colon polyps in his brother. No Known Allergies Current Outpatient Prescriptions on File Prior to Visit  Medication Sig Dispense Refill  . albuterol (PROVENTIL  HFA;VENTOLIN HFA) 108 (90 BASE) MCG/ACT inhaler Inhale 2 puffs into the lungs every 6 (six) hours as needed for wheezing or shortness of breath.    . citalopram (CELEXA) 20 MG tablet Take 1 tablet (20 mg total) by mouth daily. 90 tablet 3  . clonazePAM (KLONOPIN) 0.5 MG tablet 1 or 2 for sleep if needed 60 tablet 5  . clorazepate (TRANXENE) 15 MG tablet 1 tab at bedtime as needed for sleep 30 tablet 5  . fluticasone (FLONASE) 50 MCG/ACT nasal spray Place 2 sprays into both nostrils daily. 16 g 2  . lisinopril (PRINIVIL,ZESTRIL) 10 MG tablet Take 1 tablet (10 mg total) by mouth daily. 90 tablet 3  . omeprazole (PRILOSEC) 20 MG capsule Take 1 capsule (20 mg total) by mouth 2 (two) times daily. 90 capsule 3  . pravastatin (PRAVACHOL) 80 MG tablet Take 1 tablet (80 mg total) by mouth daily. 90 tablet 3  . terazosin (HYTRIN) 2 MG capsule Take 1 capsule (2 mg total) by mouth at bedtime. 90 capsule 3  . tiotropium (SPIRIVA) 18 MCG inhalation capsule Place 18 mcg into inhaler and inhale daily.    Marland Kitchen. zolpidem (AMBIEN) 10 MG tablet TAKE ONE TABLET BY MOUTH ONCE DAILY AT BEDTIME AS NEEDED FOR SLEEP 90 tablet 1   No current facility-administered medications on file prior to visit.    Review of Systems Constitutional: Negative for increased diaphoresis, or other activity, appetite or siginficant weight change other than noted HENT: Negative for worsening hearing loss, ear pain, facial swelling, mouth sores and neck stiffness.   Eyes: Negative  for other worsening pain, redness or visual disturbance.  Respiratory: Negative for choking or stridor Cardiovascular: Negative for other chest pain and palpitations.  Gastrointestinal: Negative for worsening diarrhea, blood in stool, or abdominal distention Genitourinary: Negative for hematuria, flank pain or change in urine volume.  Musculoskeletal: Negative for myalgias or other joint complaints.  Skin: Negative for other color change and wound or drainage.    Neurological: Negative for syncope and numbness. other than noted Hematological: Negative for adenopathy. or other swelling Psychiatric/Behavioral: Negative for hallucinations, SI, self-injury, decreased concentration or other worsening agitation.  All other system neg per pt    Objective:   Physical Exam BP 140/78   Pulse (!) 109   Temp 98.6 F (37 C)   Ht 5' 11.5" (1.816 m)   Wt 164 lb (74.4 kg)   SpO2 97%   BMI 22.55 kg/m  /VS noted, not ill appearing Constitutional: Pt is oriented to person, place, and time. Appears well-developed and well-nourished, in no significant distress Head: Normocephalic and atraumatic  Eyes: Conjunctivae and EOM are normal. Pupils are equal, round, and reactive to light Right Ear: External ear normal.  Left Ear: External ear normal Nose: Nose normal.  Mouth/Throat: Oropharynx is clear and moist  Neck: Normal range of motion. Neck supple. No JVD present. No tracheal deviation present or significant neck LA or mass Cardiovascular: Normal rate, regular rhythm, normal heart sounds and intact distal pulses.   Pulmonary/Chest: Effort normal and breath sounds without rales or wheezing  Abdominal: Soft. Bowel sounds are normal. NT. No HSM  Musculoskeletal: Normal range of motion. Exhibits no edema Lymphadenopathy: Has no cervical adenopathy.  Neurological: Pt is alert and oriented to person, place, and time. Pt has normal reflexes. No cranial nerve deficit. Motor grossly intact Skin: Skin is warm and dry. No rash noted or new ulcers Psychiatric:  Has normal mood and affect. Behavior is normal.  No other exam findings    Assessment & Plan:

## 2017-01-30 NOTE — Assessment & Plan Note (Signed)

## 2017-01-30 NOTE — Assessment & Plan Note (Signed)
stable overall by history and exam, recent data reviewed with pt, and pt to continue medical treatment as before,  to f/u any worsening symptoms or concerns BP Readings from Last 3 Encounters:  01/29/17 140/78  01/19/17 120/76  12/18/16 124/62

## 2017-01-30 NOTE — Assessment & Plan Note (Signed)
stable overall by history and exam, recent data reviewed with pt, and pt to continue medical treatment as before,  to f/u any worsening symptoms or concerns Lab Results  Component Value Date   HGBA1C 5.8 01/29/2017

## 2017-02-03 ENCOUNTER — Telehealth: Payer: Self-pay | Admitting: Internal Medicine

## 2017-02-03 NOTE — Telephone Encounter (Signed)
Debbie called back-she needs HST done on patient in order to get CPAP set up approved. I have faxed results to Auth # [id number] "additional clinical information"  [id number]. Nothing further needed at this time.

## 2017-02-03 NOTE — Telephone Encounter (Signed)
[facility]

## 2017-02-05 ENCOUNTER — Ambulatory Visit: Payer: PPO | Admitting: Internal Medicine

## 2017-02-11 ENCOUNTER — Ambulatory Visit (INDEPENDENT_AMBULATORY_CARE_PROVIDER_SITE_OTHER): Payer: PPO | Admitting: Internal Medicine

## 2017-02-11 ENCOUNTER — Encounter: Payer: Self-pay | Admitting: Internal Medicine

## 2017-02-11 VITALS — BP 118/70 | HR 109 | Temp 98.8°F | Wt 165.0 lb

## 2017-02-11 DIAGNOSIS — T148XXA Other injury of unspecified body region, initial encounter: Secondary | ICD-10-CM | POA: Diagnosis not present

## 2017-02-11 DIAGNOSIS — J449 Chronic obstructive pulmonary disease, unspecified: Secondary | ICD-10-CM

## 2017-02-11 DIAGNOSIS — I10 Essential (primary) hypertension: Secondary | ICD-10-CM

## 2017-02-11 NOTE — Patient Instructions (Signed)
OK to take tylenol as needed for the right hand for discomfort, but will otherwise heal in time  Please continue all other medications as before, and refills have been done if requested.  Please have the pharmacy call with any other refills you may need.  Please keep your appointments with your specialists as you may have planned

## 2017-02-11 NOTE — Assessment & Plan Note (Signed)
stable overall by history and exam, and pt to continue medical treatment as before,  to f/u any worsening symptoms or concerns 

## 2017-02-11 NOTE — Assessment & Plan Note (Signed)
stable overall by history and exam, recent data reviewed with pt, and pt to continue medical treatment as before,  to f/u any worsening symptoms or concerns BP Readings from Last 3 Encounters:  02/11/17 118/70  01/29/17 140/78  01/19/17 120/76

## 2017-02-11 NOTE — Progress Notes (Signed)
Subjective:    Patient ID: Ryan Diaz, male    DOB: Sep 29, 1946, 71 y.o.   MRN: [id number]  HPI  Here to f/u with admitted episiode while drinking of becoming off balance and falling with hitting the right wrist on tring to grab something on the way done.  Dnies HA, head strike, neck pain, back pain except for small abrasion to forehead as well. Pt denies new neurological symptoms such as new headache, or facial or extremity weakness or numbness   Pt denies polydipsia, polyuria, Right wrist has large echymoses but no significant pain on movement of the hand and wrist. Not on anticoagulant Past Medical History:  Diagnosis Date  . 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 ALLERGIC RHINITIS 08/08/2007  . ANXIETY 08/08/2007  . Carpal tunnel syndrome   . COPD 10/03/2009  . DEPRESSION 08/08/2007  . ERECTILE DYSFUNCTION 08/08/2007  . GENITAL HERPES, HX OF 08/08/2007  . GERD 08/08/2007  . GLUCOSE INTOLERANCE 08/08/2007  . HYPERLIPIDEMIA 08/08/2007  . HYPERTENSION 08/08/2007  . Impaired glucose tolerance 09/26/2011  . Insomnia 05/18/2012  . PAIN IN SOFT TISSUES OF LIMB 10/02/2009   No past surgical history on file.  reports that he quit smoking about 7 years ago. His smoking use included Cigarettes. He has a 52.50 pack-year smoking history. He has never used smokeless tobacco. He reports that he drinks about 27.6 - 31.8 oz of alcohol per week . He reports that he uses drugs, including Marijuana. family history includes Colon polyps in his brother. No Known Allergies Current Outpatient Prescriptions on File Prior to Visit  Medication Sig Dispense Refill  . albuterol (PROVENTIL HFA;VENTOLIN HFA) 108 (90 BASE) MCG/ACT inhaler Inhale 2 puffs into the lungs every 6 (six) hours as needed for wheezing or shortness of breath.    . citalopram (CELEXA) 20 MG tablet Take 1 tablet (20 mg total) by mouth daily. 90 tablet 3  . clonazePAM (KLONOPIN) 0.5 MG tablet 1 or 2 for sleep if needed 60 tablet 5  . clorazepate  (TRANXENE) 15 MG tablet 1 tab at bedtime as needed for sleep 30 tablet 5  . fluticasone (FLONASE) 50 MCG/ACT nasal spray Place 2 sprays into both nostrils daily. 16 g 2  . lisinopril (PRINIVIL,ZESTRIL) 10 MG tablet Take 1 tablet (10 mg total) by mouth daily. 90 tablet 3  . omeprazole (PRILOSEC) 20 MG capsule Take 1 capsule (20 mg total) by mouth 2 (two) times daily. 90 capsule 3  . pravastatin (PRAVACHOL) 80 MG tablet Take 1 tablet (80 mg total) by mouth daily. 90 tablet 3  . terazosin (HYTRIN) 2 MG capsule Take 1 capsule (2 mg total) by mouth at bedtime. 90 capsule 3  . tiotropium (SPIRIVA) 18 MCG inhalation capsule Place 18 mcg into inhaler and inhale daily.    Marland Kitchen zolpidem (AMBIEN) 10 MG tablet TAKE ONE TABLET BY MOUTH ONCE DAILY AT BEDTIME AS NEEDED FOR SLEEP 90 tablet 1   No current facility-administered medications on file prior to visit.      Review of Systems All otherwise neg per pt     Objective:   Physical Exam BP 118/70   Pulse (!) 109   Temp 98.8 F (37.1 C)   Wt 165 lb (74.8 kg)   SpO2 98%   BMI 22.69 kg/m  VS noted,  Constitutional: Pt appears in no apparent distress HENT: Head: NCAT.  Right Ear: External ear normal.  Left Ear: External ear normal.  Eyes: . Pupils are equal, round, and reactive  to light. Conjunctivae and EOM are normal Neck: Normal range of motion. Neck supple.  Cardiovascular: Normal rate and regular rhythm.   Pulmonary/Chest: Effort normal and breath sounds without rales or wheezing.  Abd:  Soft, NT, ND, + BS Neurological: Pt is alert. Not confused , motor grossly intact Skin: Skin is warm. No rash, no LE edema but large minor tender echymoses to right thumb, lateral wrist, and post hand Psychiatric: Pt behavior is normal. No agitation.  No other exam findings    Assessment & Plan:

## 2017-02-11 NOTE — Assessment & Plan Note (Signed)
Large post fall, but o/w no significant pain, low suspicion for fracture, ok to follow

## 2017-02-11 NOTE — Progress Notes (Signed)
Pre visit review using our clinic review tool, if applicable. No additional management support is needed unless otherwise documented below in the visit note. 

## 2017-02-16 ENCOUNTER — Encounter (INDEPENDENT_AMBULATORY_CARE_PROVIDER_SITE_OTHER): Payer: PPO | Admitting: Internal Medicine

## 2017-02-16 DIAGNOSIS — G4733 Obstructive sleep apnea (adult) (pediatric): Secondary | ICD-10-CM | POA: Diagnosis not present

## 2017-03-18 DIAGNOSIS — G4733 Obstructive sleep apnea (adult) (pediatric): Secondary | ICD-10-CM | POA: Diagnosis not present

## 2017-04-18 DIAGNOSIS — G4733 Obstructive sleep apnea (adult) (pediatric): Secondary | ICD-10-CM | POA: Diagnosis not present

## 2017-05-01 ENCOUNTER — Ambulatory Visit (INDEPENDENT_AMBULATORY_CARE_PROVIDER_SITE_OTHER): Payer: PPO | Admitting: Family Medicine

## 2017-05-01 ENCOUNTER — Encounter: Payer: Self-pay | Admitting: Family Medicine

## 2017-05-01 VITALS — BP 126/80 | HR 80 | Temp 98.0°F | Ht 71.5 in | Wt 165.0 lb

## 2017-05-01 DIAGNOSIS — J441 Chronic obstructive pulmonary disease with (acute) exacerbation: Secondary | ICD-10-CM

## 2017-05-01 MED ORDER — DOXYCYCLINE HYCLATE 100 MG PO TABS: 100.0000 mg | ORAL_TABLET | Freq: Two times a day (BID) | ORAL | 0 refills | Status: DC

## 2017-05-01 MED ORDER — PREDNISONE 20 MG PO TABS: ORAL_TABLET | ORAL | 0 refills | Status: DC

## 2017-05-01 NOTE — Progress Notes (Signed)
Subjective:  Ryan Diaz is a 71 y.o. year old very pleasant male patient who presents for/with See problem oriented charting ROS- no fever, chills, nausea. Has had wheezing, chest congestion, shortness of breath mild   Past Medical History-  Patient Active Problem List   Diagnosis Date Noted  . Bruising 02/11/2017  . Cough 12/16/2016  . Wheezing 12/16/2016  . Memory dysfunction 07/15/2016  . Acute sinus infection 02/12/2016  . Dizziness 07/10/2015  . Mastoiditis 07/10/2015  . Fatigue 07/10/2015  . Abnormal TSH 07/10/2015  . Pulsatile tinnitus of right ear 01/23/2015  . Renal insufficiency 01/09/2015  . Right ankle pain 04/10/2014  . OSA (obstructive sleep apnea) 09/27/2013  . Right carpal tunnel syndrome 07/06/2013  . Insomnia 05/18/2012  . Nocturia 11/18/2011  . Impaired glucose tolerance 09/26/2011  . Preventative health care 09/26/2011  . COPD mixed type (HCC) 10/03/2009  . HYPERLIPIDEMIA 08/08/2007  . Anxiety state 08/08/2007  . ERECTILE DYSFUNCTION 08/08/2007  . 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 DEPRESSION 08/08/2007  . Essential hypertension 08/08/2007  . Allergic rhinitis 08/08/2007  . GERD 08/08/2007  . GENITAL HERPES, HX OF 08/08/2007    Medications- reviewed and updated Current Outpatient Prescriptions  Medication Sig Dispense Refill  . albuterol (PROVENTIL HFA;VENTOLIN HFA) 108 (90 BASE) MCG/ACT inhaler Inhale 2 puffs into the lungs every 6 (six) hours as needed for wheezing or shortness of breath.    . citalopram (CELEXA) 20 MG tablet Take 1 tablet (20 mg total) by mouth daily. 90 tablet 3  . clonazePAM (KLONOPIN) 0.5 MG tablet 1 or 2 for sleep if needed 60 tablet 5  . clorazepate (TRANXENE) 15 MG tablet 1 tab at bedtime as needed for sleep 30 tablet 5  . fluticasone (FLONASE) 50 MCG/ACT nasal spray Place 2 sprays into both nostrils daily. 16 g 2  . lisinopril (PRINIVIL,ZESTRIL) 10 MG tablet Take 1 tablet (10 mg total) by mouth daily. 90 tablet 3   . omeprazole (PRILOSEC) 20 MG capsule Take 1 capsule (20 mg total) by mouth 2 (two) times daily. 90 capsule 3  . pravastatin (PRAVACHOL) 80 MG tablet Take 1 tablet (80 mg total) by mouth daily. 90 tablet 3  . terazosin (HYTRIN) 2 MG capsule Take 1 capsule (2 mg total) by mouth at bedtime. 90 capsule 3  . tiotropium (SPIRIVA) 18 MCG inhalation capsule Place 18 mcg into inhaler and inhale daily.    Marland Kitchen. zolpidem (AMBIEN) 10 MG tablet TAKE ONE TABLET BY MOUTH ONCE DAILY AT BEDTIME AS NEEDED FOR SLEEP 90 tablet 1  . doxycycline (VIBRA-TABS) 100 MG tablet Take 1 tablet (100 mg total) by mouth 2 (two) times daily. 14 tablet 0  . predniSONE (DELTASONE) 20 MG tablet Take 2 pills for 2 days, 1 pill for 5 days 9 tablet 0   No current facility-administered medications for this visit.     Objective: BP 126/80 (BP Location: Left Arm, Patient Position: Sitting, Cuff Size: Normal)   Pulse 80   Temp 98 F (36.7 C) (Oral)   Ht 5' 11.5" (1.816 m)   Wt 165 lb (74.8 kg)   SpO2 95%   BMI 22.69 kg/m  Gen: NAD, resting comfortably CV: RRR no murmurs rubs or gallops Lungs: occasional wheeze. Rhonchi noted.  Abdomen: thin Ext: no edema Skin: warm, dry  Assessment/Plan:  COPD exacerbation (HCC) S: Patient presents with 10 days of symptoms. Symptoms started with cough, chest congestion- sputum originally clear but now has become green and yellow with increased volume. He  is having some mild shortness of breath- noting more wheezing. Of note he is not regularly taking his spiriva and I advised this.   Symptoms include: Increase in cough Increased shortness of breath increase in sputum production positive change in sputum color Increase in frequency of wheeze.   Patient has not used his albuterol- we discussed he could use this for cough and wheeze and shortness of breath   A/P: Patient presents with COPD exacerbation based on wheeze, shortness of breath, increase in sputum production and change in its  color. Will cover with course of prednisone. Considered antibiotic and opted in- doxycycline.   Restart regular spiriva. Has been using prn only  Should follow up for new or worsening symptoms or lack of improvement with this course.   Meds ordered this encounter  Medications  . doxycycline (VIBRA-TABS) 100 MG tablet    Sig: Take 1 tablet (100 mg total) by mouth 2 (two) times daily.    Dispense:  14 tablet    Refill:  0  . predniSONE (DELTASONE) 20 MG tablet    Sig: Take 2 pills for 2 days, 1 pill for 5 days    Dispense:  9 tablet    Refill:  0    Return precautions advised.  Tana Conch, MD

## 2017-05-01 NOTE — Patient Instructions (Addendum)
COPD flare up based on wheeze, shortness of breath, increase in sputum production and change in its color. Will cover with course of prednisone. Considered antibiotic and opted in- doxycycline.   PLEASE take your spiriva regularly- you will be less likely to have flare ups like this  Should follow up for new or worsening symptoms or lack of improvement with this course.

## 2017-05-18 DIAGNOSIS — G4733 Obstructive sleep apnea (adult) (pediatric): Secondary | ICD-10-CM | POA: Diagnosis not present

## 2017-05-21 ENCOUNTER — Emergency Department (HOSPITAL_COMMUNITY)
Admission: EM | Admit: 2017-05-21 | Discharge: 2017-05-21 | Disposition: A | Discharge status: Home / Self Care | Payer: PPO | Attending: Emergency Medicine | Admitting: Emergency Medicine

## 2017-05-21 ENCOUNTER — Encounter (HOSPITAL_COMMUNITY): Payer: Self-pay | Admitting: *Deleted

## 2017-05-21 DIAGNOSIS — Z79899 Other long term (current) drug therapy: Secondary | ICD-10-CM | POA: Insufficient documentation

## 2017-05-21 DIAGNOSIS — R42 Dizziness and giddiness: Secondary | ICD-10-CM

## 2017-05-21 DIAGNOSIS — I10 Essential (primary) hypertension: Secondary | ICD-10-CM | POA: Insufficient documentation

## 2017-05-21 DIAGNOSIS — E785 Hyperlipidemia, unspecified: Secondary | ICD-10-CM | POA: Insufficient documentation

## 2017-05-21 DIAGNOSIS — Z87891 Personal history of nicotine dependence: Secondary | ICD-10-CM | POA: Insufficient documentation

## 2017-05-21 DIAGNOSIS — E860 Dehydration: Secondary | ICD-10-CM

## 2017-05-21 LAB — CBC WITH DIFFERENTIAL/PLATELET
Basophils Absolute: 0 10*3/uL (ref 0.0–0.1)
Basophils Relative: 1 %
Eosinophils Absolute: 0.3 10*3/uL (ref 0.0–0.7)
Eosinophils Relative: 5 %
HCT: 40.9 % (ref 39.0–52.0)
Hemoglobin: 13.7 g/dL (ref 13.0–17.0)
Lymphocytes Relative: 33 %
Lymphs Abs: 1.9 10*3/uL (ref 0.7–4.0)
MCH: 32.2 pg (ref 26.0–34.0)
MCHC: 33.5 g/dL (ref 30.0–36.0)
MCV: 96 fL (ref 78.0–100.0)
Monocytes Absolute: 0.5 10*3/uL (ref 0.1–1.0)
Monocytes Relative: 9 %
Neutro Abs: 3.1 10*3/uL (ref 1.7–7.7)
Neutrophils Relative %: 52 %
Platelets: 209 10*3/uL (ref 150–400)
RBC: 4.26 MIL/uL (ref 4.22–5.81)
RDW: 12 % (ref 11.5–15.5)
WBC: 5.9 10*3/uL (ref 4.0–10.5)

## 2017-05-21 LAB — BASIC METABOLIC PANEL
Anion gap: 8 (ref 5–15)
BUN: 11 mg/dL (ref 6–20)
CO2: 22 mmol/L (ref 22–32)
Calcium: 9.6 mg/dL (ref 8.9–10.3)
Chloride: 103 mmol/L (ref 101–111)
Creatinine, Ser: 1.03 mg/dL (ref 0.61–1.24)
GFR calc Af Amer: >60 mL/min (ref >60)
GFR calc non Af Amer: >60 mL/min (ref >60)
Glucose, Bld: 118 mg/dL — ABNORMAL HIGH (ref 65–99)
Potassium: 4.1 mmol/L (ref 3.5–5.1)
Sodium: 133 mmol/L — ABNORMAL LOW (ref 135–145)

## 2017-05-21 LAB — URINALYSIS, ROUTINE W REFLEX MICROSCOPIC
Bilirubin Urine: NEGATIVE
Glucose, UA: NEGATIVE mg/dL
Hgb urine dipstick: NEGATIVE
Ketones, ur: NEGATIVE mg/dL
Leukocytes, UA: NEGATIVE
Nitrite: NEGATIVE
Protein, ur: NEGATIVE mg/dL
Specific Gravity, Urine: 1.006 (ref 1.005–1.030)
pH: 8 (ref 5.0–8.0)

## 2017-05-21 MED ORDER — SODIUM CHLORIDE 0.9 % IV BOLUS (SEPSIS)
1000.0000 mL | Freq: Once | INTRAVENOUS | Status: AC
  Administered 2017-05-21: 1000 mL via INTRAVENOUS

## 2017-05-21 NOTE — ED Notes (Signed)
Patient using urinal will start IV when patient is finished

## 2017-05-21 NOTE — ED Provider Notes (Signed)
[facility]-EMERGENCY DEPT Provider Note   CSN: [id number] Arrival date & time: 05/21/17  0757     History   Chief Complaint Chief Complaint  Patient presents with  . Dizziness    HPI Ryan Diaz is a 71 y.o. male.  Pt is a 71 yo male who presents today with dizziness 2/2 positional changes.  He reports in the last week it has been very hot and he has been outside more than usual.  He reports not drinking enough and that when he stands up quickly or bends over quickly he begins to feel dizzy.  He is additionally concerned about increased urination that occurs at the end of the day when he finally does take time to drink water.  He denies any dysuria, shortness of breath, LOC, palpitations or vision changes.  His blood pressure higher than normal he reports and he says he hasn't taken his medications yet today.  He reports feeling thirsty today and feeling dehydrated.      Past Medical History:  Diagnosis Date  . 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 ALLERGIC RHINITIS 08/08/2007  . ANXIETY 08/08/2007  . Carpal tunnel syndrome   . COPD 10/03/2009  . DEPRESSION 08/08/2007  . ERECTILE DYSFUNCTION 08/08/2007  . GENITAL HERPES, HX OF 08/08/2007  . GERD 08/08/2007  . GLUCOSE INTOLERANCE 08/08/2007  . HYPERLIPIDEMIA 08/08/2007  . HYPERTENSION 08/08/2007  . Impaired glucose tolerance 09/26/2011  . Insomnia 05/18/2012  . PAIN IN SOFT TISSUES OF LIMB 10/02/2009    Patient Active Problem List   Diagnosis Date Noted  . Bruising 02/11/2017  . Cough 12/16/2016  . Wheezing 12/16/2016  . Memory dysfunction 07/15/2016  . Acute sinus infection 02/12/2016  . Dizziness 07/10/2015  . Mastoiditis 07/10/2015  . Fatigue 07/10/2015  . Abnormal TSH 07/10/2015  . Pulsatile tinnitus of right ear 01/23/2015  . Renal insufficiency 01/09/2015  . Right ankle pain 04/10/2014  . OSA (obstructive sleep apnea) 09/27/2013  . Right carpal tunnel syndrome 07/06/2013  . Insomnia 05/18/2012  . Nocturia  11/18/2011  . Impaired glucose tolerance 09/26/2011  . Preventative health care 09/26/2011  . COPD mixed type (HCC) 10/03/2009  . HYPERLIPIDEMIA 08/08/2007  . Anxiety state 08/08/2007  . ERECTILE DYSFUNCTION 08/08/2007  . 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 DEPRESSION 08/08/2007  . Essential hypertension 08/08/2007  . Allergic rhinitis 08/08/2007  . GERD 08/08/2007  . GENITAL HERPES, HX OF 08/08/2007    History reviewed. No pertinent surgical history.     Home Medications    Prior to Admission medications   Medication Sig Start Date End Date Taking? Authorizing Provider  albuterol (PROVENTIL HFA;VENTOLIN HFA) 108 (90 BASE) MCG/ACT inhaler Inhale 2 puffs into the lungs every 6 (six) hours as needed for wheezing or shortness of breath.    [provider]  citalopram (CELEXA) 20 MG tablet Take 1 tablet (20 mg total) by mouth daily. 07/15/16   Corwin Levins, MD  clonazePAM Scarlette Calico) 0.5 MG tablet 1 or 2 for sleep if needed 10/21/16   Jetty Duhamel D, MD  clorazepate (TRANXENE) 15 MG tablet 1 tab at bedtime as needed for sleep 01/19/17   Jetty Duhamel D, MD  doxycycline (VIBRA-TABS) 100 MG tablet Take 1 tablet (100 mg total) by mouth 2 (two) times daily. 05/01/17   Shelva Majestic, MD  fluticasone (FLONASE) 50 MCG/ACT nasal spray Place 2 sprays into both nostrils daily. 01/09/15   Corwin Levins, MD  lisinopril (PRINIVIL,ZESTRIL) 10 MG tablet Take 1 tablet (10  mg total) by mouth daily. 01/09/15   Corwin Levins, MD  omeprazole (PRILOSEC) 20 MG capsule Take 1 capsule (20 mg total) by mouth 2 (two) times daily. 11/18/11   Corwin Levins, MD  pravastatin (PRAVACHOL) 80 MG tablet Take 1 tablet (80 mg total) by mouth daily. 11/18/11   Corwin Levins, MD  predniSONE (DELTASONE) 20 MG tablet Take 2 pills for 2 days, 1 pill for 5 days 05/01/17   Shelva Majestic, MD  terazosin (HYTRIN) 2 MG capsule Take 1 capsule (2 mg total) by mouth at bedtime. 01/09/15   Corwin Levins, MD  tiotropium  (SPIRIVA) 18 MCG inhalation capsule Place 18 mcg into inhaler and inhale daily.    [provider]  zolpidem (AMBIEN) 10 MG tablet TAKE ONE TABLET BY MOUTH ONCE DAILY AT BEDTIME AS NEEDED FOR SLEEP 07/10/15   Corwin Levins, MD    Family History Family History  Problem Relation Age of Onset  . Colon polyps Brother     Social History Social History  Substance Use Topics  . Smoking status: Former Smoker    Packs/day: 1.50    Years: 35.00    Types: Cigarettes    Quit date: 09/04/2009  . Smokeless tobacco: Never Used  . Alcohol use 27.6 - 31.8 oz/week    18 Standard drinks or equivalent, 28 - 35 Cans of beer per week     Comment: everyday drinker amount varies      Allergies   Patient has no known allergies.   Review of Systems Review of Systems  Constitutional: Negative for activity change, chills and fever.  HENT: Negative for drooling, ear pain, postnasal drip and sore throat.   Eyes: Negative for pain, discharge and visual disturbance.  Respiratory: Negative for cough, shortness of breath and wheezing.   Cardiovascular: Negative for chest pain, palpitations and leg swelling.  Gastrointestinal: Positive for constipation. Negative for abdominal pain, diarrhea and vomiting.  Endocrine: Negative for cold intolerance and heat intolerance.  Genitourinary: Positive for frequency. Negative for decreased urine volume, difficulty urinating, dysuria, hematuria and urgency.  Musculoskeletal: Negative for arthralgias and back pain.  Skin: Negative for color change and rash.  Allergic/Immunologic: Negative for immunocompromised state.  Neurological: Positive for dizziness and light-headedness. Negative for seizures, syncope, facial asymmetry and headaches.  Hematological: Negative for adenopathy.  Psychiatric/Behavioral: Negative for agitation, behavioral problems and confusion.  All other systems reviewed and are negative.    Physical Exam Updated Vital Signs BP (!)  173/97   Pulse 81   Temp 98.2 F (36.8 C) (Oral)   Resp 17   Ht 5\' 11"  (1.803 m)   Wt 74.8 kg (165 lb)   SpO2 96%   BMI 23.01 kg/m   Physical Exam  Constitutional: He appears well-developed and well-nourished.  HENT:  Head: Normocephalic and atraumatic.  Eyes: Conjunctivae are normal. Right eye exhibits no discharge. Left eye exhibits no discharge. No scleral icterus.  Neck: Neck supple. No JVD present.  Cardiovascular: Normal rate and regular rhythm.   No murmur heard. Pulmonary/Chest: Effort normal and breath sounds normal. No stridor. No respiratory distress.  Abdominal: Soft. He exhibits no distension. There is no tenderness. There is no guarding.  Musculoskeletal: He exhibits no edema.  Neurological: He is alert. No cranial nerve deficit.  Skin: Skin is warm and dry.  Psychiatric: He has a normal mood and affect.  Nursing note and vitals reviewed.    ED Treatments / Results  Labs (all labs  ordered are listed, but only abnormal results are displayed) Labs Reviewed  URINALYSIS, ROUTINE W REFLEX MICROSCOPIC  CBC WITH DIFFERENTIAL/PLATELET  BASIC METABOLIC PANEL    EKG  EKG Interpretation None       Radiology No results found.  Procedures Procedures (including critical care time)  Medications Ordered in ED Medications  sodium chloride 0.9 % bolus 1,000 mL (not administered)     Initial Impression / Assessment and Plan / ED Course  I have reviewed the triage vital signs and the nursing notes.  Pertinent labs & imaging results that were available during my care of the patient were reviewed by me and considered in my medical decision making (see chart for details).    Orthostatic Hypotension: DDX: anemia, UTI, Drug Side effects  -urinalysis, CBC, BMP -1L normal saline -Patient feeling better after receiving fluid, negative for UTI, anemia -Counseling on consistent fluid intake throughout the day and staying cool in the hot [place] summ[place] -D/C  patient with follow up for PCP to adjust bp medication if necessary  Final Clinical Impressions(s) / ED Diagnoses   Final diagnoses:  None    New Prescriptions New Prescriptions   No medications on file     Angelita InglesWinfrey, Carley Strickling B, MD 05/21/17 1101    Gerhard MunchLockwood, Robert, MD 05/24/17 [id number]

## 2017-05-21 NOTE — ED Triage Notes (Signed)
Pt reports dizziness onset x 1 wk, pt reports excessive urination, pt denies CP, SOB, & pain, pt denies visual changes with symptoms, A&O x4

## 2017-05-28 ENCOUNTER — Encounter: Payer: Self-pay | Admitting: Internal Medicine

## 2017-05-28 ENCOUNTER — Ambulatory Visit (INDEPENDENT_AMBULATORY_CARE_PROVIDER_SITE_OTHER): Payer: PPO | Admitting: Internal Medicine

## 2017-05-28 VITALS — BP 138/86 | HR 72 | Ht 71.0 in | Wt 165.0 lb

## 2017-05-28 DIAGNOSIS — R42 Dizziness and giddiness: Secondary | ICD-10-CM | POA: Diagnosis not present

## 2017-05-28 DIAGNOSIS — I10 Essential (primary) hypertension: Secondary | ICD-10-CM

## 2017-05-28 DIAGNOSIS — G8929 Other chronic pain: Secondary | ICD-10-CM

## 2017-05-28 DIAGNOSIS — Z0000 Encounter for general adult medical examination without abnormal findings: Secondary | ICD-10-CM | POA: Diagnosis not present

## 2017-05-28 DIAGNOSIS — M25571 Pain in right ankle and joints of right foot: Secondary | ICD-10-CM | POA: Diagnosis not present

## 2017-05-28 DIAGNOSIS — M25572 Pain in left ankle and joints of left foot: Secondary | ICD-10-CM | POA: Diagnosis not present

## 2017-05-28 MED ORDER — DICLOFENAC SODIUM 1 % TD GEL: 4.0000 g | Freq: Four times a day (QID) | TRANSDERMAL | 11 refills | Status: DC | PRN

## 2017-05-28 MED ORDER — ATORVASTATIN CALCIUM 80 MG PO TABS: 80.0000 mg | ORAL_TABLET | Freq: Every day | ORAL | 3 refills | Status: DC

## 2017-05-28 NOTE — Progress Notes (Signed)
Subjective:    Patient ID: Ryan Diaz, male    DOB: 09/13/1946, 71 y.o.   MRN: [id number]  HPI  Here to f/u recent 7/6 ED visit with positional dizziness c/w orthostasis after having been in the heat more recently, not drinking enough. BP found to be mild high but had missed his BP med.  Pt denies chest pain, increased sob or doe, wheezing, orthopnea, PND, increased LE swelling, palpitations, or syncope.  Felt improved in ED with IVF and lab evaluation neg for acute.  Since then has stayed out of the heat, taking po fluids increased and much less orthostasis.  Only c/o today is of bilat ankle DJD with pain not always controlled.  No giveaways or falls Past Medical History:  Diagnosis Date  . 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 ALLERGIC RHINITIS 08/08/2007  . ANXIETY 08/08/2007  . Carpal tunnel syndrome   . COPD 10/03/2009  . DEPRESSION 08/08/2007  . ERECTILE DYSFUNCTION 08/08/2007  . GENITAL HERPES, HX OF 08/08/2007  . GERD 08/08/2007  . GLUCOSE INTOLERANCE 08/08/2007  . HYPERLIPIDEMIA 08/08/2007  . HYPERTENSION 08/08/2007  . Impaired glucose tolerance 09/26/2011  . Insomnia 05/18/2012  . PAIN IN SOFT TISSUES OF LIMB 10/02/2009   No past surgical history on file.  reports that he quit smoking about 7 years ago. His smoking use included Cigarettes. He has a 52.50 pack-year smoking history. He has never used smokeless tobacco. He reports that he drinks about 27.6 - 31.8 oz of alcohol per week . He reports that he uses drugs, including Marijuana. family history includes Colon polyps in his brother. No Known Allergies Current Outpatient Prescriptions on File Prior to Visit  Medication Sig Dispense Refill  . albuterol (PROVENTIL HFA;VENTOLIN HFA) 108 (90 BASE) MCG/ACT inhaler Inhale 2 puffs into the lungs every 6 (six) hours as needed for wheezing or shortness of breath.    Marland Kitchen. aspirin EC 81 MG tablet Take 81 mg by mouth daily.    . fluticasone (FLONASE) 50 MCG/ACT nasal spray Place 2 sprays  into both nostrils daily. 16 g 2  . lisinopril (PRINIVIL,ZESTRIL) 10 MG tablet Take 1 tablet (10 mg total) by mouth daily. 90 tablet 3  . Omega-3 Fatty Acids (FISH OIL) 1000 MG CAPS Take 1,000 mg by mouth 2 (two) times daily.    Marland Kitchen. omeprazole (PRILOSEC) 20 MG capsule Take 1 capsule (20 mg total) by mouth 2 (two) times daily. 90 capsule 3  . terazosin (HYTRIN) 2 MG capsule Take 1 capsule (2 mg total) by mouth at bedtime. 90 capsule 3   No current facility-administered medications on file prior to visit.    Review of Systems  Constitutional: Negative for other unusual diaphoresis or sweats HENT: Negative for ear discharge or swelling Eyes: Negative for other worsening visual disturbances Respiratory: Negative for stridor or other swelling  Gastrointestinal: Negative for worsening distension or other blood Genitourinary: Negative for retention or other urinary change Musculoskeletal: Negative for other MSK pain or swelling Skin: Negative for color change or other new lesions Neurological: Negative for worsening tremors and other numbness  Psychiatric/Behavioral: Negative for worsening agitation or other fatigue All other system neg per pt    Objective:   Physical Exam BP 138/86   Pulse 72   Ht 5\' 11"  (1.803 m)   Wt 165 lb (74.8 kg)   SpO2 99%   BMI 23.01 kg/m  VS noted, normal wt Constitutional: Pt appears in NAD HENT: Head: NCAT.  Right Ear: External ear normal.  Left Ear: External ear normal.  Eyes: . Pupils are equal, round, and reactive to light. Conjunctivae and EOM are normal Nose: without d/c or deformity Neck: Neck supple. Gross normal ROM Cardiovascular: Normal rate and regular rhythm.   Pulmonary/Chest: Effort normal and breath sounds without rales or wheezing.  Bilat ankles with mild bony degenerative changes Neurological: Pt is alert. At baseline orientation, motor grossly intact Skin: Skin is warm. No rashes, other new lesions, no LE edema Psychiatric: Pt behavior  is normal without agitation  No other exam findings       Assessment & Plan:

## 2017-05-28 NOTE — Patient Instructions (Signed)
,  Please take all new medication as prescribed - the voltaren gel for ankle pain  Please continue all other medications as before, and refills have been done if requested.  Please have the pharmacy call with any other refills you may need.  Please continue your efforts at being more active, low cholesterol diet, and weight control.  Please keep your appointments with your specialists as you may have planned  Please return in 6 months, or sooner if needed, with Lab testing done 3-5 days before

## 2017-05-30 NOTE — Assessment & Plan Note (Signed)
resovled with recent tx, no further evaluation needed, encouraged to keep hydrated

## 2017-05-30 NOTE — Assessment & Plan Note (Signed)
C/w DJD, for volt gel prn,  to f/u any worsening symptoms or concerns 

## 2017-05-30 NOTE — Assessment & Plan Note (Signed)
stable overall by history and exam, recent data reviewed with pt, and pt to continue medical treatment as before,  to f/u any worsening symptoms or concerns BP Readings from Last 3 Encounters:  05/28/17 138/86  05/21/17 (!) 178/94  05/01/17 126/80

## 2017-06-18 DIAGNOSIS — G4733 Obstructive sleep apnea (adult) (pediatric): Secondary | ICD-10-CM | POA: Diagnosis not present

## 2017-07-19 DIAGNOSIS — G4733 Obstructive sleep apnea (adult) (pediatric): Secondary | ICD-10-CM | POA: Diagnosis not present

## 2017-08-18 ENCOUNTER — Encounter: Payer: Self-pay | Admitting: Family Medicine

## 2017-08-18 ENCOUNTER — Ambulatory Visit (INDEPENDENT_AMBULATORY_CARE_PROVIDER_SITE_OTHER): Payer: PPO | Admitting: Family Medicine

## 2017-08-18 VITALS — BP 112/72 | HR 80 | Temp 98.5°F | Resp 16 | Ht 71.0 in | Wt 170.0 lb

## 2017-08-18 DIAGNOSIS — J449 Chronic obstructive pulmonary disease, unspecified: Secondary | ICD-10-CM

## 2017-08-18 DIAGNOSIS — G4733 Obstructive sleep apnea (adult) (pediatric): Secondary | ICD-10-CM | POA: Diagnosis not present

## 2017-08-18 DIAGNOSIS — J441 Chronic obstructive pulmonary disease with (acute) exacerbation: Secondary | ICD-10-CM

## 2017-08-18 DIAGNOSIS — Z23 Encounter for immunization: Secondary | ICD-10-CM

## 2017-08-18 MED ORDER — PREDNISONE 50 MG PO TABS: 50.0000 mg | ORAL_TABLET | Freq: Every day | ORAL | 0 refills | Status: DC

## 2017-08-18 NOTE — Progress Notes (Signed)
Ryan Diaz - 71 y.o. male MRN [id number]  Date of birth: 1946/05/30  SUBJECTIVE:  Including CC & ROS.  Chief Complaint  Patient presents with  . Shortness of Breath    states that he has trouble breathing with CPAP, has had the CPAP since january and has only had trouble a few times with the breathing     [name] is a 71 year old male that is having some shortness of breath. His symptoms started a few days ago. He feels like he is unable to breathe when he places the mask of his C-peptide on. Yes take this off to breathe. He denies any fevers or chills. He is having some coughing. He is also having some sinus congestion. He has a history of smoking and COPD. He denies any sick contacts. He denies any fevers or chills.     Review of Systems  Constitutional: Negative for fever.  HENT: Positive for congestion.   Respiratory: Positive for cough and shortness of breath.     HISTORY: Past Medical, Surgical, Social, and Family History Reviewed & Updated per EMR.   Pertinent Historical Findings include:  Past Medical History:  Diagnosis Date  . 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 ALLERGIC RHINITIS 08/08/2007  . ANXIETY 08/08/2007  . Carpal tunnel syndrome   . COPD 10/03/2009  . DEPRESSION 08/08/2007  . ERECTILE DYSFUNCTION 08/08/2007  . GENITAL HERPES, HX OF 08/08/2007  . GERD 08/08/2007  . GLUCOSE INTOLERANCE 08/08/2007  . HYPERLIPIDEMIA 08/08/2007  . HYPERTENSION 08/08/2007  . Impaired glucose tolerance 09/26/2011  . Insomnia 05/18/2012  . PAIN IN SOFT TISSUES OF LIMB 10/02/2009    No past surgical history on file.  No Known Allergies  Family History  Problem Relation Age of Onset  . Colon polyps Brother      Social History   Social History  . Marital status: Single    Spouse name: N/A  . Number of children: N/A  . Years of education: N/A   Occupational History  . self employed auto body work    Social History Main Topics  . Smoking status: Former Smoker   Packs/day: 1.50    Years: 35.00    Types: Cigarettes    Quit date: 09/04/2009  . Smokeless tobacco: Never Used  . Alcohol use 27.6 - 31.8 oz/week    18 Standard drinks or equivalent, 28 - 35 Cans of beer per week     Comment: everyday drinker amount varies   . Drug use: Yes    Types: Marijuana     Comment: marijuana occassionally   . Sexual activity: No   Other Topics Concern  . Not on file   Social History Narrative  . No narrative on file     PHYSICAL EXAM:  VS: BP 112/72 (BP Location: Left Arm, Patient Position: Sitting, Cuff Size: Normal)   Pulse 80   Temp 98.5 F (36.9 C) (Oral)   Resp 16   Ht  (1.803 m)   Wt 170 lb (77.1 kg)   SpO2 96%   BMI 23.71 kg/m  Physical Exam Gen: NAD, alert, cooperative with exam, well-appearing ENT: normal lips, normal nasal mucosa,  Eye: normal EOM, normal conjunctiva and lids CV:  no edema, +2 pedal pulses, S1-S2, regular rate and rhythm   Resp: no accessory muscle use, non-labored, speaking in full sentences, some end expiratory wheezes appreciated, GI: no masses or tenderness, no hernia  Skin: no rashes, no areas of induration  Neuro: normal tone, normal sensation  to touch Psych:  normal insight, alert and oriented MSK: Normal gait, normal strength      ASSESSMENT & PLAN:   COPD mixed type (HCC) Feel like his symptoms are likely related to a mild COPD exacerbation or just posterior to an underlying viral illness. But does have some wheezing - Provided steroids - Encouraged supportive care. - If no improvement can consider chest x-ray and the addition of an antibiotic

## 2017-08-18 NOTE — Assessment & Plan Note (Signed)
Feel like his symptoms are likely related to a mild COPD exacerbation or just posterior to an underlying viral illness. But does have some wheezing - Provided steroids - Encouraged supportive care. - If no improvement can consider chest x-ray and the addition of an antibiotic

## 2017-08-18 NOTE — Patient Instructions (Addendum)
Thank you for coming in,   Please try things such as zyrtec-D or allegra-D which is an antihistamine and decongestant.   Please try afrin which will help with nasal congestion but use for only three days.   Please also try using a netti pot on a regular occasion.  Honey can help with a sore throat.    Please let us know if you don't get any better.   Please feel free to call with any questions or concerns at any time, at [phone]. --Dr. Jordan Likes

## 2017-09-07 DIAGNOSIS — G4733 Obstructive sleep apnea (adult) (pediatric): Secondary | ICD-10-CM | POA: Diagnosis not present

## 2017-09-18 DIAGNOSIS — G4733 Obstructive sleep apnea (adult) (pediatric): Secondary | ICD-10-CM | POA: Diagnosis not present

## 2017-10-14 ENCOUNTER — Encounter: Payer: Self-pay | Admitting: Internal Medicine

## 2017-10-14 ENCOUNTER — Other Ambulatory Visit (INDEPENDENT_AMBULATORY_CARE_PROVIDER_SITE_OTHER): Payer: PPO

## 2017-10-14 ENCOUNTER — Ambulatory Visit: Payer: PPO | Admitting: Internal Medicine

## 2017-10-14 VITALS — BP 134/82 | HR 79 | Temp 98.0°F | Ht 71.0 in | Wt 175.0 lb

## 2017-10-14 DIAGNOSIS — I10 Essential (primary) hypertension: Secondary | ICD-10-CM | POA: Diagnosis not present

## 2017-10-14 DIAGNOSIS — R21 Rash and other nonspecific skin eruption: Secondary | ICD-10-CM

## 2017-10-14 DIAGNOSIS — E785 Hyperlipidemia, unspecified: Secondary | ICD-10-CM

## 2017-10-14 DIAGNOSIS — R5383 Other fatigue: Secondary | ICD-10-CM | POA: Diagnosis not present

## 2017-10-14 DIAGNOSIS — R7302 Impaired glucose tolerance (oral): Secondary | ICD-10-CM | POA: Diagnosis not present

## 2017-10-14 DIAGNOSIS — Z0000 Encounter for general adult medical examination without abnormal findings: Secondary | ICD-10-CM

## 2017-10-14 LAB — HEPATIC FUNCTION PANEL
ALT: 32 U/L (ref 0–53)
AST: 24 U/L (ref 0–37)
Albumin: 4.4 g/dL (ref 3.5–5.2)
Alkaline Phosphatase: 64 U/L (ref 39–117)
Bilirubin, Direct: 0.1 mg/dL (ref 0.0–0.3)
Total Bilirubin: 0.5 mg/dL (ref 0.2–1.2)
Total Protein: 7.6 g/dL (ref 6.0–8.3)

## 2017-10-14 LAB — HEMOGLOBIN A1C: Hgb A1c MFr Bld: 5.7 % (ref 4.6–6.5)

## 2017-10-14 LAB — BASIC METABOLIC PANEL
BUN: 24 mg/dL — ABNORMAL HIGH (ref 6–23)
CO2: 28 mEq/L (ref 19–32)
Calcium: 9.7 mg/dL (ref 8.4–10.5)
Chloride: 103 mEq/L (ref 96–112)
Creatinine, Ser: 1.18 mg/dL (ref 0.40–1.50)
GFR: 78.11 mL/min (ref >60.00)
Glucose, Bld: 99 mg/dL (ref 70–99)
Potassium: 4.3 mEq/L (ref 3.5–5.1)
Sodium: 137 mEq/L (ref 135–145)

## 2017-10-14 LAB — LIPID PANEL
Cholesterol: 122 mg/dL (ref 0–200)
HDL: 33.7 mg/dL — ABNORMAL LOW (ref >39.00)
LDL Cholesterol: 71 mg/dL (ref 0–99)
NonHDL: 87.9
Total CHOL/HDL Ratio: 4
Triglycerides: 84 mg/dL (ref 0.0–149.0)
VLDL: 16.8 mg/dL (ref 0.0–40.0)

## 2017-10-14 MED ORDER — METRONIDAZOLE 0.75 % EX CREA: TOPICAL_CREAM | Freq: Two times a day (BID) | CUTANEOUS | 1 refills | Status: DC

## 2017-10-14 NOTE — Patient Instructions (Addendum)
Please take all new medication as prescribed  The topical antibiotic for the face (for possible rosacea)  Please call for Dermatology referral if not improved by 1 week  Please continue all other medications as before, and refills have been done if requested.  Please have the pharmacy call with any other refills you may need.  Please continue your efforts at being more active, low cholesterol diet, and weight control.  Please keep your appointments with your specialists as you may have planned  Please go to the LAB in the Basement (turn left off the elevator) for the tests to be done today  You will be contacted by phone if any changes need to be made immediately.  Otherwise, you will receive a letter about your results with an explanation, but please check with MyChart first.  Please remember to sign up for MyChart if you have not done so, as this will be important to you in the future with finding out test results, communicating by private email, and scheduling acute appointments online when needed.  Please return in 6 months, or sooner if needed, with Lab testing done 3-5 days before  OK to cancel the Jan 15 appointment unless you want to keep that

## 2017-10-14 NOTE — Progress Notes (Signed)
Subjective:    Patient ID: Ryan Diaz Fromer, male    DOB: 02/06/1946, 71 y.o.   MRN: [id number]  HPI   Here to f/u with c/o 2 wks onset diffuse scaly slightly itchy facial rash to forehead and facies bilat, but not to neck or other area.  No fever, trauma.   Pt denies polydipsia, polyuria.  Does c/o ongoing fatigue, but denies signficant daytime hypersomnolence.  Pt denies chest pain, increased sob or doe, wheezing, orthopnea, PND, increased LE swelling, palpitations, dizziness or syncope.  Pt denies new neurological symptoms such as new headache, or facial or extremity weakness or numbness    States overall the voltaren gel really helped the joint pain Past Medical History:  Diagnosis Date  . 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 ALLERGIC RHINITIS 08/08/2007  . ANXIETY 08/08/2007  . Carpal tunnel syndrome   . COPD 10/03/2009  . DEPRESSION 08/08/2007  . ERECTILE DYSFUNCTION 08/08/2007  . GENITAL HERPES, HX OF 08/08/2007  . GERD 08/08/2007  . GLUCOSE INTOLERANCE 08/08/2007  . HYPERLIPIDEMIA 08/08/2007  . HYPERTENSION 08/08/2007  . Impaired glucose tolerance 09/26/2011  . Insomnia 05/18/2012  . PAIN IN SOFT TISSUES OF LIMB 10/02/2009   No past surgical history on file.  reports that he quit smoking about 8 years ago. His smoking use included cigarettes. He has a 52.50 pack-year smoking history. he has never used smokeless tobacco. He reports that he drinks about 27.6 - 31.8 oz of alcohol per week. He reports that he uses drugs. Drug: Marijuana. family history includes Colon polyps in his brother. No Known Allergies Current Outpatient Medications on File Prior to Visit  Medication Sig Dispense Refill  . albuterol (PROVENTIL HFA;VENTOLIN HFA) 108 (90 BASE) MCG/ACT inhaler Inhale 2 puffs into the lungs every 6 (six) hours as needed for wheezing or shortness of breath.    Marland Kitchen. aspirin EC 81 MG tablet Take 81 mg by mouth daily.    Marland Kitchen. atorvastatin (LIPITOR) 80 MG tablet Take 1 tablet (80 mg total) by mouth  daily. 90 tablet 3  . diclofenac sodium (VOLTAREN) 1 % GEL Apply 4 g topically 4 (four) times daily as needed. 400 g 11  . fluticasone (FLONASE) 50 MCG/ACT nasal spray Place 2 sprays into both nostrils daily. 16 g 2  . lisinopril (PRINIVIL,ZESTRIL) 10 MG tablet Take 1 tablet (10 mg total) by mouth daily. 90 tablet 3  . Omega-3 Fatty Acids (FISH OIL) 1000 MG CAPS Take 1,000 mg by mouth 2 (two) times daily.    Marland Kitchen. omeprazole (PRILOSEC) 20 MG capsule Take 1 capsule (20 mg total) by mouth 2 (two) times daily. 90 capsule 3  . terazosin (HYTRIN) 2 MG capsule Take 1 capsule (2 mg total) by mouth at bedtime. 90 capsule 3  . tiotropium (SPIRIVA) 18 MCG inhalation capsule Place 18 mcg into inhaler and inhale daily.     No current facility-administered medications on file prior to visit.    Review of Systems  Constitutional: Negative for other unusual diaphoresis or sweats HENT: Negative for ear discharge or swelling Eyes: Negative for other worsening visual disturbances Respiratory: Negative for stridor or other swelling  Gastrointestinal: Negative for worsening distension or other blood Genitourinary: Negative for retention or other urinary change Musculoskeletal: Negative for other MSK pain or swelling Skin: Negative for color change or other new lesions Neurological: Negative for worsening tremors and other numbness  Psychiatric/Behavioral: Negative for worsening agitation or other fatigue All other system neg per pt    Objective:  Physical Exam BP 134/82   Pulse 79   Temp 98 F (36.7 C) (Oral)   Ht 5\' 11"  (1.803 m)   Wt 175 lb (79.4 kg)   SpO2 98%   BMI 24.41 kg/m  VS noted,  Constitutional: Pt appears in NAD HENT: Head: NCAT.  Right Ear: External ear normal.  Left Ear: External ear normal.  Eyes: . Pupils are equal, round, and reactive to light. Conjunctivae and EOM are normal Nose: without d/c or deformity Neck: Neck supple. Gross normal ROM Cardiovascular: Normal rate and  regular rhythm.   Pulmonary/Chest: Effort normal and breath sounds without rales or wheezing.  Neurological: Pt is alert. At baseline orientation, motor grossly intact Skin: Skin is warm. +rash scaly NT erythema to forehead and facies, no other new lesions, no LE edema Psychiatric: Pt behavior is normal without agitation  No other exam findings    Assessment & Plan:

## 2017-10-16 DIAGNOSIS — R21 Rash and other nonspecific skin eruption: Secondary | ICD-10-CM | POA: Insufficient documentation

## 2017-10-16 NOTE — Assessment & Plan Note (Signed)
Also for testosterone tx,  to f/u any worsening symptoms or concerns

## 2017-10-16 NOTE — Assessment & Plan Note (Signed)
Lab Results  Component Value Date   HGBA1C 5.7 10/14/2017  stable overall by history and exam, recent data reviewed with pt, and pt to continue medical treatment as before,  to f/u any worsening symptoms or concerns

## 2017-10-16 NOTE — Assessment & Plan Note (Signed)
C/w seborrhea vs rosacea - for trial flagyl cream, consider derm if not improved

## 2017-10-16 NOTE — Assessment & Plan Note (Signed)
BP Readings from Last 3 Encounters:  10/14/17 134/82  08/18/17 112/72  05/28/17 138/86  stable overall by history and exam, recent data reviewed with pt, and pt to continue medical treatment as before,  to f/u any worsening symptoms or concerns

## 2017-10-16 NOTE — Assessment & Plan Note (Signed)
Lab Results  Component Value Date   LDLCALC 71 10/14/2017  stable overall by history and exam, recent data reviewed with pt, and pt to continue medical treatment as before,  to f/u any worsening symptoms or concerns

## 2017-10-18 DIAGNOSIS — G4733 Obstructive sleep apnea (adult) (pediatric): Secondary | ICD-10-CM | POA: Diagnosis not present

## 2017-11-01 ENCOUNTER — Telehealth: Payer: Self-pay

## 2017-11-01 DIAGNOSIS — R21 Rash and other nonspecific skin eruption: Secondary | ICD-10-CM

## 2017-11-01 NOTE — Telephone Encounter (Signed)
Referral done

## 2017-11-01 NOTE — Addendum Note (Signed)
Addended by: Corwin LevinsJOHN, Ranier Coach W on: 11/01/2017 04:11 PM   Modules accepted: Orders

## 2017-11-01 NOTE — Telephone Encounter (Signed)
Copied from CRM [phone]. Topic: Referral - Request >> Nov 01, 2017 12:53 PM Waymon AmatoBurton, Donna F wrote: Reason for CRM:pt is wanting to get a referral to a dermatologist best number is [phone]

## 2017-11-18 DIAGNOSIS — G4733 Obstructive sleep apnea (adult) (pediatric): Secondary | ICD-10-CM | POA: Diagnosis not present

## 2017-11-30 ENCOUNTER — Ambulatory Visit: Payer: PPO | Admitting: Internal Medicine

## 2017-12-03 DIAGNOSIS — L218 Other seborrheic dermatitis: Secondary | ICD-10-CM | POA: Diagnosis not present

## 2018-01-19 ENCOUNTER — Encounter: Payer: Self-pay | Admitting: Internal Medicine

## 2018-01-20 ENCOUNTER — Encounter: Payer: Self-pay | Admitting: Internal Medicine

## 2018-01-20 ENCOUNTER — Ambulatory Visit: Payer: PPO | Admitting: Internal Medicine

## 2018-01-20 VITALS — BP 120/80 | HR 82 | Ht 71.5 in | Wt 173.8 lb

## 2018-01-20 DIAGNOSIS — J449 Chronic obstructive pulmonary disease, unspecified: Secondary | ICD-10-CM | POA: Diagnosis not present

## 2018-01-20 DIAGNOSIS — G4733 Obstructive sleep apnea (adult) (pediatric): Secondary | ICD-10-CM | POA: Diagnosis not present

## 2018-01-20 NOTE — Patient Instructions (Signed)
We can continue CPAP auto 5-20, mask of choice, humidifier, supplies, AirView  Ok to continue current breathing meds- please call if you need refills

## 2018-01-20 NOTE — Assessment & Plan Note (Signed)
Medications are effective and appropriate.  At his baseline he is little bothered by exertional dyspnea and not at all by cough or wheeze, rarely needing rescue inhaler.

## 2018-01-20 NOTE — Assessment & Plan Note (Deleted)
Download confirms good compliance and control. He sleeps better with CPAP and pressure is good.  Plan- continue CPAP auto 5-20

## 2018-01-20 NOTE — Assessment & Plan Note (Signed)
Download confirms good compliance and control. He sleeps better with CPAP and pressure is good.  Plan- continue CPAP auto 5-20 

## 2018-01-20 NOTE — Progress Notes (Signed)
HPI male former smoker followed for OSA, insomnia, complicated by ETOH, COPD, GERD, HBP NPSG 10/31/13- AHI 52/hour, desaturation to 83%, body weight 165 pounds Unattended Home Sleep Test 09/02/2016- AHI 9.4/hour, desaturation to 87%, body weight 163 pounds  -------------------------------------------------------------------------------------  01/19/2017-72 year old male former smoker followed for OSA, insomnia, complicated by history EtOH, COPD, GERD, HBP CPAP auto 5-20/[facility] FOLLOWS FOR: [id number]; Pt wears CPAP just about every night and DL attached. No new supplies needed at this time.  He says some nights "if I drink" he will fall asleep without his CPAP. Says his auto titration pressure range 5-20 is comfortable Download-adequate compliance 70% 4 hour, used 80% of days, AHI 6.3/hour. We reviewed his download report together and discussed compliance. He says he can't sleep without clonazepam  01/20/18- 72 year old male former smoker followed for OSA, insomnia, complicated by history EtOH, COPD, GERD, HBP CPAP auto 5-20/[facility] ----Patient is doing well overall with cpap machine. Spiriva HandiHaler, Proventil HFA, Download 83% compliance, AHI 4.6/hour. Comfortable with his CPAP.  Machine is about a year old.  He sleeps better with it. Breathing is stable with rare need for rescue inhaler as long as he continues daily Spiriva.  Did have flu vaccine.  ROS-see HPI   + = positive Constitutional:    weight loss, night sweats, fevers, chills, +fatigue, lassitude. HEENT:    headaches, difficulty swallowing, tooth/dental problems, sore throat,       sneezing, itching, ear ache, nasal congestion, post nasal drip, snoring CV:    chest pain, orthopnea, PND, swelling in lower extremities, anasarca,                                 dizziness, palpitations Resp:   shortness of breath with exertion or at rest.                productive cough,   non-productive cough, coughing up of blood.       change in color of mucus.  wheezing.   Skin:    rash or lesions. GI:  No-   heartburn, indigestion, abdominal pain, nausea, vomiting, diarrhea,                 change in bowel habits, loss of appetite GU: dysuria, change in color of urine, no urgency or frequency.   flank pain. MS:   joint pain, stiffness, decreased range of motion, back pain. Neuro-     nothing unusual Psych:  change in mood or affect.  depression or anxiety.   memory loss.  OBJ- Physical Exam                   General- Alert, Oriented, Affect-appropriate, Distress- none acute, + not obese Skin- rash-none, lesions- none, excoriation- none Lymphadenopathy- none Head- atraumatic            Eyes- Gross vision intact, PERRLA, conjunctivae and secretions clear            Ears- Hearing, canals-normal            Nose- Clear, no-Septal dev, mucus, polyps, erosion, perforation             Throat- Mallampati III-IV , mucosa clear , drainage- none, tonsils- atrophic Neck- flexible , trachea midline, no stridor , thyroid nl, carotid no bruit Chest - symmetrical excursion , unlabored           Heart/CV- RRR , no murmur , no gallop  , no  rub, nl s1 s2                           - JVD- none , edema- none, stasis changes- none, varices- none           Lung- clear to P&A, wheeze- none, cough- none , dullness-none, rub- none           Chest wall-  Abd-  Br/ Gen/ Rectal- Not done, not indicated Extrem- cyanosis- none, clubbing, none, atrophy- none, strength- nl Neuro- grossly intact to observation

## 2018-01-27 ENCOUNTER — Telehealth: Payer: Self-pay | Admitting: Internal Medicine

## 2018-01-27 NOTE — Telephone Encounter (Signed)
lmtcb x1 for pt. 

## 2018-01-28 NOTE — Telephone Encounter (Signed)
Patient returned call, CB [phone].

## 2018-01-28 NOTE — Telephone Encounter (Signed)
Pt is returning call. Cb is [phone]. Home [phone].

## 2018-01-28 NOTE — Telephone Encounter (Signed)
lmtcb x2 for pt. 

## 2018-01-28 NOTE — Telephone Encounter (Signed)
Spoke with pt. He is requesting a copy of his CPAP download. Report will be placed in the outgoing mail. Nothing further was needed.

## 2018-01-28 NOTE — Telephone Encounter (Signed)
Called patient unable to reach on [phone] number it has been disconnected. Left message for patient on home number.

## 2018-03-09 ENCOUNTER — Ambulatory Visit (INDEPENDENT_AMBULATORY_CARE_PROVIDER_SITE_OTHER): Payer: PPO | Admitting: Internal Medicine

## 2018-03-09 ENCOUNTER — Encounter: Payer: Self-pay | Admitting: Internal Medicine

## 2018-03-09 ENCOUNTER — Ambulatory Visit (INDEPENDENT_AMBULATORY_CARE_PROVIDER_SITE_OTHER)
Admission: RE | Admit: 2018-03-09 | Discharge: 2018-03-09 | Disposition: A | Discharge status: Home / Self Care | Payer: PPO | Source: Ambulatory Visit | Attending: Internal Medicine | Admitting: Internal Medicine

## 2018-03-09 ENCOUNTER — Other Ambulatory Visit (INDEPENDENT_AMBULATORY_CARE_PROVIDER_SITE_OTHER): Payer: PPO

## 2018-03-09 VITALS — BP 126/84 | HR 91 | Temp 98.5°F | Ht 71.5 in | Wt 171.0 lb

## 2018-03-09 DIAGNOSIS — M25571 Pain in right ankle and joints of right foot: Secondary | ICD-10-CM

## 2018-03-09 DIAGNOSIS — R413 Other amnesia: Secondary | ICD-10-CM

## 2018-03-09 DIAGNOSIS — Z0000 Encounter for general adult medical examination without abnormal findings: Secondary | ICD-10-CM

## 2018-03-09 DIAGNOSIS — R7302 Impaired glucose tolerance (oral): Secondary | ICD-10-CM | POA: Diagnosis not present

## 2018-03-09 DIAGNOSIS — J449 Chronic obstructive pulmonary disease, unspecified: Secondary | ICD-10-CM | POA: Diagnosis not present

## 2018-03-09 DIAGNOSIS — Z23 Encounter for immunization: Secondary | ICD-10-CM

## 2018-03-09 DIAGNOSIS — I10 Essential (primary) hypertension: Secondary | ICD-10-CM | POA: Diagnosis not present

## 2018-03-09 LAB — URINALYSIS, ROUTINE W REFLEX MICROSCOPIC
Bilirubin Urine: NEGATIVE
Hgb urine dipstick: NEGATIVE
Ketones, ur: NEGATIVE
Leukocytes, UA: NEGATIVE
Nitrite: NEGATIVE
RBC / HPF: NONE SEEN (ref 0–?)
Specific Gravity, Urine: 1.02 (ref 1.000–1.030)
Total Protein, Urine: NEGATIVE
Urine Glucose: NEGATIVE
Urobilinogen, UA: 0.2 (ref 0.0–1.0)
WBC, UA: NONE SEEN (ref 0–?)
pH: 6 (ref 5.0–8.0)

## 2018-03-09 LAB — CBC WITH DIFFERENTIAL/PLATELET
Basophils Absolute: 0.1 10*3/uL (ref 0.0–0.1)
Basophils Relative: 1.1 % (ref 0.0–3.0)
Eosinophils Absolute: 0.5 10*3/uL (ref 0.0–0.7)
Eosinophils Relative: 8 % — ABNORMAL HIGH (ref 0.0–5.0)
HCT: 43 % (ref 39.0–52.0)
Hemoglobin: 15 g/dL (ref 13.0–17.0)
Lymphocytes Relative: 33.4 % (ref 12.0–46.0)
Lymphs Abs: 2.2 10*3/uL (ref 0.7–4.0)
MCHC: 34.8 g/dL (ref 30.0–36.0)
MCV: 94 fl (ref 78.0–100.0)
Monocytes Absolute: 0.8 10*3/uL (ref 0.1–1.0)
Monocytes Relative: 12.3 % — ABNORMAL HIGH (ref 3.0–12.0)
Neutro Abs: 3 10*3/uL (ref 1.4–7.7)
Neutrophils Relative %: 45.2 % (ref 43.0–77.0)
Platelets: 300 10*3/uL (ref 150.0–400.0)
RBC: 4.58 Mil/uL (ref 4.22–5.81)
RDW: 12.5 % (ref 11.5–15.5)
WBC: 6.7 10*3/uL (ref 4.0–10.5)

## 2018-03-09 LAB — BASIC METABOLIC PANEL
BUN: 15 mg/dL (ref 6–23)
CO2: 25 mEq/L (ref 19–32)
Calcium: 9.6 mg/dL (ref 8.4–10.5)
Chloride: 103 mEq/L (ref 96–112)
Creatinine, Ser: 1.03 mg/dL (ref 0.40–1.50)
GFR: 91.28 mL/min (ref >60.00)
Glucose, Bld: 89 mg/dL (ref 70–99)
Potassium: 4.1 mEq/L (ref 3.5–5.1)
Sodium: 136 mEq/L (ref 135–145)

## 2018-03-09 LAB — VITAMIN B12: Vitamin B-12: 416 pg/mL (ref 211–911)

## 2018-03-09 LAB — HEPATIC FUNCTION PANEL
ALT: 30 U/L (ref 0–53)
AST: 23 U/L (ref 0–37)
Albumin: 4.4 g/dL (ref 3.5–5.2)
Alkaline Phosphatase: 64 U/L (ref 39–117)
Bilirubin, Direct: 0.1 mg/dL (ref 0.0–0.3)
Total Bilirubin: 0.4 mg/dL (ref 0.2–1.2)
Total Protein: 7.5 g/dL (ref 6.0–8.3)

## 2018-03-09 LAB — LIPID PANEL
Cholesterol: 123 mg/dL (ref 0–200)
HDL: 34.5 mg/dL — ABNORMAL LOW (ref >39.00)
LDL Cholesterol: 54 mg/dL (ref 0–99)
NonHDL: 88.9
Total CHOL/HDL Ratio: 4
Triglycerides: 176 mg/dL — ABNORMAL HIGH (ref 0.0–149.0)
VLDL: 35.2 mg/dL (ref 0.0–40.0)

## 2018-03-09 LAB — PSA: PSA: 1.47 ng/mL (ref 0.10–4.00)

## 2018-03-09 LAB — HEMOGLOBIN A1C: Hgb A1c MFr Bld: 5.6 % (ref 4.6–6.5)

## 2018-03-09 LAB — TSH: TSH: 0.75 u[IU]/mL (ref 0.35–4.50)

## 2018-03-09 MED ORDER — MELOXICAM 15 MG PO TABS: 15.0000 mg | ORAL_TABLET | Freq: Every day | ORAL | 2 refills | Status: DC | PRN

## 2018-03-09 NOTE — Progress Notes (Signed)
Subjective:    Patient ID: Ryan Diaz, male    DOB: 11/06/1946, 72 y.o.   MRN: [id number]  HPI   Here for wellness and f/u;  Overall doing ok;  Pt denies Chest pain, worsening SOB, DOE, wheezing, orthopnea, PND, worsening LE edema, palpitations, dizziness or syncope.  Pt denies neurological change such as new headache, facial or extremity weakness.  Pt denies polydipsia, polyuria, or low sugar symptoms. Pt states overall good compliance with treatment and medications, good tolerability, and has been trying to follow appropriate diet.  Pt denies worsening depressive symptoms, suicidal ideation or panic. No fever, night sweats, wt loss, loss of appetite, or other constitutional symptoms.  Pt states good ability with ADL's, has low fall risk, home safety reviewed and adequate, no other significant changes in hearing or vision, and only occasionally active with exercise. Also -  Did have a giveaway episode to the right ankle with getting out of a car from drivers side, where left was ok but first wt on the right seemed to cause ankle to "lock up" (didn't move well) and couldn't support his wt due to pain and weakness for some reason,  Sat back in car, pain improved over a few hrs, but still having intermitent mild to mod pain (no swelling) mostly to instep and lateral aspect right ankle as well.  No further falls, but still with pain on standing most times.     No other interval hx or new complaint Past Medical History:  Diagnosis Date  . 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 ALLERGIC RHINITIS 08/08/2007  . ANXIETY 08/08/2007  . Carpal tunnel syndrome   . COPD 10/03/2009  . DEPRESSION 08/08/2007  . ERECTILE DYSFUNCTION 08/08/2007  . GENITAL HERPES, HX OF 08/08/2007  . GERD 08/08/2007  . GLUCOSE INTOLERANCE 08/08/2007  . HYPERLIPIDEMIA 08/08/2007  . HYPERTENSION 08/08/2007  . Impaired glucose tolerance 09/26/2011  . Insomnia 05/18/2012  . PAIN IN SOFT TISSUES OF LIMB 10/02/2009   No past surgical  history on file.  reports that he quit smoking about 8 years ago. His smoking use included cigarettes. He has a 52.50 pack-year smoking history. He has never used smokeless tobacco. He reports that he drinks about 27.6 - 31.8 oz of alcohol per week. He reports that he has current or past drug history. Drug: Marijuana. family history includes Colon polyps in his brother. No Known Allergies Current Outpatient Medications on File Prior to Visit  Medication Sig Dispense Refill  . albuterol (PROVENTIL HFA;VENTOLIN HFA) 108 (90 BASE) MCG/ACT inhaler Inhale 2 puffs into the lungs every 6 (six) hours as needed for wheezing or shortness of breath.    Marland Kitchen. aspirin EC 81 MG tablet Take 81 mg by mouth daily.    Marland Kitchen. atorvastatin (LIPITOR) 80 MG tablet Take 1 tablet (80 mg total) by mouth daily. 90 tablet 3  . diclofenac sodium (VOLTAREN) 1 % GEL Apply 4 g topically 4 (four) times daily as needed. 400 g 11  . fluticasone (FLONASE) 50 MCG/ACT nasal spray Place 2 sprays into both nostrils daily. 16 g 2  . lisinopril (PRINIVIL,ZESTRIL) 10 MG tablet Take 1 tablet (10 mg total) by mouth daily. 90 tablet 3  . metroNIDAZOLE (METROCREAM) 0.75 % cream Apply topically 2 (two) times daily. 45 g 1  . Omega-3 Fatty Acids (FISH OIL) 1000 MG CAPS Take 1,000 mg by mouth 2 (two) times daily.    Marland Kitchen. omeprazole (PRILOSEC) 20 MG capsule Take 1 capsule (20 mg total) by mouth  2 (two) times daily. 90 capsule 3  . terazosin (HYTRIN) 2 MG capsule Take 1 capsule (2 mg total) by mouth at bedtime. 90 capsule 3  . tiotropium (SPIRIVA) 18 MCG inhalation capsule Place 18 mcg into inhaler and inhale daily.     No current facility-administered medications on file prior to visit.    Review of Systems Constitutional: Negative for other unusual diaphoresis, sweats, appetite or weight changes HENT: Negative for other worsening hearing loss, ear pain, facial swelling, mouth sores or neck stiffness.   Eyes: Negative for other worsening pain, redness or  other visual disturbance.  Respiratory: Negative for other stridor or swelling Cardiovascular: Negative for other palpitations or other chest pain  Gastrointestinal: Negative for worsening diarrhea or loose stools, blood in stool, distention or other pain Genitourinary: Negative for hematuria, flank pain or other change in urine volume.  Musculoskeletal: Negative for myalgias or other joint swelling.  Skin: Negative for other color change, or other wound or worsening drainage.  Neurological: Negative for other syncope or numbness. Hematological: Negative for other adenopathy or swelling Psychiatric/Behavioral: Negative for hallucinations, other worsening agitation, SI, self-injury, or new decreased concentration All other system neg per pt    Objective:   Physical Exam BP 126/84   Pulse 91   Temp 98.5 F (36.9 C) (Oral)   Ht 5' 11.5" (1.816 m)   Wt 171 lb (77.6 kg)   SpO2 96%   BMI 23.52 kg/m  VS noted,  Constitutional: Pt is oriented to person, place, and time. Appears well-developed and well-nourished, in no significant distress and comfortable Head: Normocephalic and atraumatic  Eyes: Conjunctivae and EOM are normal. Pupils are equal, round, and reactive to light Right Ear: External ear normal without discharge Left Ear: External ear normal without discharge Nose: Nose without discharge or deformity Mouth/Throat: Oropharynx is without other ulcerations and moist  Neck: Normal range of motion. Neck supple. No JVD present. No tracheal deviation present or significant neck LA or mass Cardiovascular: Normal rate, regular rhythm, normal heart sounds and intact distal pulses.   Pulmonary/Chest: WOB normal and breath sounds without rales or wheezing  Abdominal: Soft. Bowel sounds are normal. NT. No HSM  Musculoskeletal: Normal range of motion. Exhibits no edema Lymphadenopathy: Has no other cervical adenopathy.  Neurological: Pt is alert and oriented to person, place, and time. Pt  has normal reflexes. No cranial nerve deficit. Motor grossly intact, Gait intact Skin: Skin is warm and dry. No rash noted or new ulcerations Psychiatric:  Has normal mood and affect. Behavior is normal without agitation Right ankle with lateral malleolar tender swelling without bruising, and mild tender to the medial tarsal tunnel without swelling or bruising, o/w with neurovasc intact and ankle not unstable No other exam findings Lab Results  Component Value Date   WBC 6.7 03/09/2018   HGB 15.0 03/09/2018   HCT 43.0 03/09/2018   PLT 300.0 03/09/2018   GLUCOSE 89 03/09/2018   CHOL 123 03/09/2018   TRIG 176.0 (H) 03/09/2018   HDL 34.50 (L) 03/09/2018   LDLDIRECT 96.7 01/06/2013   LDLCALC 54 03/09/2018   ALT 30 03/09/2018   AST 23 03/09/2018   NA 136 03/09/2018   K 4.1 03/09/2018   CL 103 03/09/2018   CREATININE 1.03 03/09/2018   BUN 15 03/09/2018   CO2 25 03/09/2018   TSH 0.75 03/09/2018   PSA 1.47 03/09/2018   HGBA1C 5.6 03/09/2018          Assessment & Plan:

## 2018-03-09 NOTE — Patient Instructions (Addendum)
You had the Pneumovax pneumonia shot today  Please continue all other medications as before, and refills have been done if requested.  Please have the pharmacy call with any other refills you may need.  Please continue your efforts at being more active, low cholesterol diet, and weight control.  You are otherwise up to date with prevention measures today.  You will be contacted regarding the referral for: Sports medicine  Please keep your appointments with your specialists as you may have planned  Please go to the XRAY Department in the Basement (go straight as you get off the elevator) for the x-ray testing  Please go to the LAB in the Basement (turn left off the elevator) for the tests to be done today  You will be contacted by phone if any changes need to be made immediately.  Otherwise, you will receive a letter about your results with an explanation, but please check with MyChart first.  Please remember to sign up for MyChart if you have not done so, as this will be important to you in the future with finding out test results, communicating by private email, and scheduling acute appointments online when needed.  Please return in 6 months, or sooner if needed

## 2018-03-10 ENCOUNTER — Telehealth: Payer: Self-pay

## 2018-03-10 ENCOUNTER — Encounter: Payer: Self-pay | Admitting: Internal Medicine

## 2018-03-10 NOTE — Assessment & Plan Note (Signed)
stable overall by history and exam, recent data reviewed with pt, and pt to continue medical treatment as before,  to f/u any worsening symptoms or concerns Lab Results  Component Value Date   HGBA1C 5.6 03/09/2018

## 2018-03-10 NOTE — Telephone Encounter (Signed)
-----   Message from Corwin LevinsJames W John, MD sent at 03/09/2018  4:48 PM EDT ----- Rip Harbourk to let pt know - xray for the ankle was OK (no fractures or other arthritis seen)

## 2018-03-10 NOTE — Assessment & Plan Note (Addendum)
Acute with recent giveaway, still with swelling and pain but not unstable and ambulating ok, for film but less likely than not for fracture, for pain control, and refer sport med  In addition to the time spent performing CPE, I spent an additional 15 minutes face to face,in which greater than 50% of this time was spent in counseling and coordination of care for patient's illness as documented, including the differential dx, treatment, further evaluation and other management of acute right ankle pain, hyperglycemia, HLD, and copd

## 2018-03-10 NOTE — Assessment & Plan Note (Signed)
stable overall by history and exam, recent data reviewed with pt, and pt to continue medical treatment as before,  to f/u any worsening symptoms or concerns BP Readings from Last 3 Encounters:  03/09/18 126/84  01/20/18 120/80  10/14/17 134/82

## 2018-03-10 NOTE — Telephone Encounter (Signed)
Called pt, LVM.   CRM created.  

## 2018-03-10 NOTE — Assessment & Plan Note (Signed)
stable overall by history and exam, and pt to continue medical treatment as before,  to f/u any worsening symptoms or concerns 

## 2018-03-21 ENCOUNTER — Ambulatory Visit (INDEPENDENT_AMBULATORY_CARE_PROVIDER_SITE_OTHER): Payer: PPO | Admitting: Family Medicine

## 2018-03-21 ENCOUNTER — Encounter: Payer: Self-pay | Admitting: Family Medicine

## 2018-03-21 DIAGNOSIS — M25571 Pain in right ankle and joints of right foot: Secondary | ICD-10-CM | POA: Diagnosis not present

## 2018-03-21 NOTE — Patient Instructions (Signed)
Good to see you  Please try the exercises on a daily basis  Please use ice on a regular basis  Please try aspercream with lidocaine  Please follow up with me in 3 weeks if there is no improvement in your pain

## 2018-03-21 NOTE — Progress Notes (Signed)
Ryan Diaz - 72 y.o. male MRN [id number]  Date of birth: 06-18-1946  SUBJECTIVE:  Including CC & ROS.  Chief Complaint  Patient presents with  . Right ankle pain    Ryan Diaz is a 72 y.o. male that is presenting with right ankle pain. Pain is acute on chronic in nature. Pain is located his right lateral malleolus. Admits to weakness. Pain is intermittent, noticeable when he standing or walking. He has been applying Voltaren gel with some improvement. Denies injury or surgeries. Had an episode where he had catching of his ankle and went away when he starting walking.   Independent review of the right ankle xray from 4/24 shows a normal exam.    Review of Systems  Constitutional: Negative for fever.  HENT: Negative for congestion.   Respiratory: Negative for cough.   Cardiovascular: Negative for chest pain.  Gastrointestinal: Negative for abdominal pain.  Musculoskeletal: Negative for gait problem.  Skin: Negative for color change.  Neurological: Negative for weakness.  Hematological: Negative for adenopathy.  Psychiatric/Behavioral: Negative for agitation.    HISTORY: Past Medical, Surgical, Social, and Family History Reviewed & Updated per EMR.   Pertinent Historical Findings include:  Past Medical History:  Diagnosis Date  . 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 ALLERGIC RHINITIS 08/08/2007  . ANXIETY 08/08/2007  . Carpal tunnel syndrome   . COPD 10/03/2009  . DEPRESSION 08/08/2007  . ERECTILE DYSFUNCTION 08/08/2007  . GENITAL HERPES, HX OF 08/08/2007  . GERD 08/08/2007  . GLUCOSE INTOLERANCE 08/08/2007  . HYPERLIPIDEMIA 08/08/2007  . HYPERTENSION 08/08/2007  . Impaired glucose tolerance 09/26/2011  . Insomnia 05/18/2012  . PAIN IN SOFT TISSUES OF LIMB 10/02/2009    No past surgical history on file.  No Known Allergies  Family History  Problem Relation Age of Onset  . Colon polyps Brother      Social History   Socioeconomic History  . Marital status:  Single    Spouse name: Not on file  . Number of children: Not on file  . Years of education: Not on file  . Highest education level: Not on file  Occupational History  . Occupation: self employed Airline pilot work  Engineer, production  . Financial resource strain: Not on file  . Food insecurity:    Worry: Not on file    Inability: Not on file  . Transportation needs:    Medical: Not on file    Non-medical: Not on file  Tobacco Use  . Smoking status: Former Smoker    Packs/day: 1.50    Years: 35.00    Pack years: 52.50    Types: Cigarettes    Last attempt to quit: 09/04/2009    Years since quitting: 8.5  . Smokeless tobacco: Never Used  Substance and Sexual Activity  . Alcohol use: Yes    Alcohol/week: 27.6 - 31.8 oz    Types: 28 - 35 Cans of beer, 18 Standard drinks or equivalent per week    Comment: everyday drinker amount varies   . Drug use: Yes    Types: Marijuana    Comment: marijuana occassionally   . Sexual activity: Never  Lifestyle  . Physical activity:    Days per week: Not on file    Minutes per session: Not on file  . Stress: Not on file  Relationships  . Social connections:    Talks on phone: Not on file    Gets together: Not on file    Attends religious service:  Not on file    Active member of club or organization: Not on file    Attends meetings of clubs or organizations: Not on file    Relationship status: Not on file  . Intimate partner violence:    Fear of current or ex partner: Not on file    Emotionally abused: Not on file    Physically abused: Not on file    Forced sexual activity: Not on file  Other Topics Concern  . Not on file  Social History Narrative  . Not on file     PHYSICAL EXAM:  VS: BP 138/89 (BP Location: Left Arm, Patient Position: Sitting, Cuff Size: Normal)   Pulse 64   Ht 5' 11.5" (1.816 m)   Wt 171 lb (77.6 kg)   SpO2 (!) 76%   BMI 23.52 kg/m  Physical Exam Gen: NAD, alert, cooperative with exam, well-appearing ENT:  normal lips, normal nasal mucosa,  Eye: normal EOM, normal conjunctiva and lids CV:  no edema, +2 pedal pulses   Resp: no accessory muscle use, non-labored,  Skin: no rashes, no areas of induration  Neuro: normal tone, normal sensation to touch Psych:  normal insight, alert and oriented MSK:  Right ankle:  No effusion  Normal ROM  No ecchymosis or swelling  Flatter foot No abnormal callus  Negative anterior drawer  No TTP of the ATFL   Several errors with one leg testing of right ankle  Normal subtalar shift  Neurovascularly intact      ASSESSMENT & PLAN:   Right ankle pain Pain seems to be related to chronic ankle instability. Has a flatter foot  - counseled on HEP  - counseled on conservative care - if no improvement consider an injection vs PT vs custom orthotic

## 2018-03-21 NOTE — Assessment & Plan Note (Signed)
Pain seems to be related to chronic ankle instability. Has a flatter foot  - counseled on HEP  - counseled on conservative care - if no improvement consider an injection vs PT vs custom orthotic

## 2018-04-14 ENCOUNTER — Ambulatory Visit (INDEPENDENT_AMBULATORY_CARE_PROVIDER_SITE_OTHER): Payer: PPO | Admitting: Internal Medicine

## 2018-04-14 ENCOUNTER — Encounter: Payer: Self-pay | Admitting: Internal Medicine

## 2018-04-14 VITALS — BP 106/68 | HR 85 | Temp 98.5°F | Ht 71.5 in | Wt 168.0 lb

## 2018-04-14 DIAGNOSIS — R7302 Impaired glucose tolerance (oral): Secondary | ICD-10-CM

## 2018-04-14 DIAGNOSIS — I10 Essential (primary) hypertension: Secondary | ICD-10-CM

## 2018-04-14 DIAGNOSIS — J449 Chronic obstructive pulmonary disease, unspecified: Secondary | ICD-10-CM

## 2018-04-14 MED ORDER — OMEPRAZOLE 20 MG PO CPDR: 20.0000 mg | DELAYED_RELEASE_CAPSULE | Freq: Two times a day (BID) | ORAL | 3 refills | Status: DC

## 2018-04-14 NOTE — Progress Notes (Signed)
Subjective:    Patient ID: Ryan Diaz, male    DOB: Aug 19, 1946, 72 y.o.   MRN: [id number]  HPI  Here to f/u; overall doing ok,  Pt denies chest pain, increasing sob or doe, wheezing, orthopnea, PND, increased LE swelling, palpitations, dizziness or syncope.  Pt denies new neurological symptoms such as new headache, or facial or extremity weakness or numbness.  Pt denies polydipsia, polyuria, or low sugar episode.  Pt states overall good compliance with meds, mostly trying to follow appropriate diet, with wt overall stable,  but little exercise however. Has stable mild DOE.  Denies worsening reflux overall but still occasional, without other abd pain, dysphagia, n/v, bowel change or blood.TUMS helps but still gets breakthrough, out of prilosec for several months  No other interval hx or new complaint Past Medical History:  Diagnosis Date  . 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 ALLERGIC RHINITIS 08/08/2007  . ANXIETY 08/08/2007  . Carpal tunnel syndrome   . COPD 10/03/2009  . DEPRESSION 08/08/2007  . ERECTILE DYSFUNCTION 08/08/2007  . GENITAL HERPES, HX OF 08/08/2007  . GERD 08/08/2007  . GLUCOSE INTOLERANCE 08/08/2007  . HYPERLIPIDEMIA 08/08/2007  . HYPERTENSION 08/08/2007  . Impaired glucose tolerance 09/26/2011  . Insomnia 05/18/2012  . PAIN IN SOFT TISSUES OF LIMB 10/02/2009   No past surgical history on file.  reports that he quit smoking about 8 years ago. His smoking use included cigarettes. He has a 52.50 pack-year smoking history. He has never used smokeless tobacco. He reports that he drinks about 27.6 - 31.8 oz of alcohol per week. He reports that he has current or past drug history. Drug: Marijuana. family history includes Colon polyps in his brother. No Known Allergies Current Outpatient Medications on File Prior to Visit  Medication Sig Dispense Refill  . albuterol (PROVENTIL HFA;VENTOLIN HFA) 108 (90 BASE) MCG/ACT inhaler Inhale 2 puffs into the lungs every 6 (six) hours as  needed for wheezing or shortness of breath.    Marland Kitchen aspirin EC 81 MG tablet Take 81 mg by mouth daily.    Marland Kitchen atorvastatin (LIPITOR) 80 MG tablet Take 1 tablet (80 mg total) by mouth daily. 90 tablet 3  . diclofenac sodium (VOLTAREN) 1 % GEL Apply 4 g topically 4 (four) times daily as needed. 400 g 11  . fluticasone (FLONASE) 50 MCG/ACT nasal spray Place 2 sprays into both nostrils daily. 16 g 2  . lisinopril (PRINIVIL,ZESTRIL) 10 MG tablet Take 1 tablet (10 mg total) by mouth daily. 90 tablet 3  . metroNIDAZOLE (METROCREAM) 0.75 % cream Apply topically 2 (two) times daily. 45 g 1  . Omega-3 Fatty Acids (FISH OIL) 1000 MG CAPS Take 1,000 mg by mouth 2 (two) times daily.    Marland Kitchen terazosin (HYTRIN) 2 MG capsule Take 1 capsule (2 mg total) by mouth at bedtime. 90 capsule 3  . tiotropium (SPIRIVA) 18 MCG inhalation capsule Place 18 mcg into inhaler and inhale daily.     No current facility-administered medications on file prior to visit.    Review of Systems  Constitutional: Negative for other unusual diaphoresis or sweats HENT: Negative for ear discharge or swelling Eyes: Negative for other worsening visual disturbances Respiratory: Negative for stridor or other swelling  Gastrointestinal: Negative for worsening distension or other blood Genitourinary: Negative for retention or other urinary change Musculoskeletal: Negative for other MSK pain or swelling Skin: Negative for color change or other new lesions Neurological: Negative for worsening tremors and other numbness  Psychiatric/Behavioral: Negative  for worsening agitation or other fatigue All other system neg per pt    Objective:   Physical Exam BP 106/68   Pulse 85   Temp 98.5 F (36.9 C) (Oral)   Ht 5' 11.5" (1.816 m)   Wt 168 lb (76.2 kg)   SpO2 96%   BMI 23.10 kg/m  VS noted,  Constitutional: Pt appears in NAD HENT: Head: NCAT.  Right Ear: External ear normal.  Left Ear: External ear normal.  Eyes: . Pupils are equal, round,  and reactive to light. Conjunctivae and EOM are normal Nose: without d/c or deformity Neck: Neck supple. Gross normal ROM Cardiovascular: Normal rate and regular rhythm.   Pulmonary/Chest: Effort normal and breath sounds without rales or wheezing.  Abd:  Soft, NT, ND, + BS, no organomegaly Neurological: Pt is alert. At baseline orientation, motor grossly intact Skin: Skin is warm. No rashes, other new lesions, no LE edema Psychiatric: Pt behavior is normal without agitation  No other exam findings    Assessment & Plan:

## 2018-04-14 NOTE — Patient Instructions (Signed)
Please continue all other medications as before, and refills have been done if requested.  Please have the pharmacy call with any other refills you may need.  Please continue your efforts at being more active, low cholesterol diet, and weight control.  You are otherwise up to date with prevention measures today.  Please keep your appointments with your specialists as you may have planned  Please return in 6 months, or sooner if needed 

## 2018-04-16 NOTE — Assessment & Plan Note (Signed)
stable overall by history and exam, recent data reviewed with pt, and pt to continue medical treatment as before,  to f/u any worsening symptoms or concerns Lab Results  Component Value Date   HGBA1C 5.6 03/09/2018

## 2018-04-16 NOTE — Assessment & Plan Note (Addendum)
stable overall by history and exam, and pt to continue medical treatment as before,  to f/u any worsening symptoms or concerns 

## 2018-04-16 NOTE — Assessment & Plan Note (Signed)
stable overall by history and exam, recent data reviewed with pt, and pt to continue medical treatment as before,  to f/u any worsening symptoms or concerns BP Readings from Last 3 Encounters:  04/14/18 106/68  03/21/18 138/89  03/09/18 126/84

## 2018-07-11 ENCOUNTER — Encounter: Payer: Self-pay | Admitting: Internal Medicine

## 2018-07-11 ENCOUNTER — Ambulatory Visit (INDEPENDENT_AMBULATORY_CARE_PROVIDER_SITE_OTHER): Payer: PPO | Admitting: Internal Medicine

## 2018-07-11 VITALS — BP 116/66 | HR 79 | Temp 97.6°F | Ht 71.5 in | Wt 169.0 lb

## 2018-07-11 DIAGNOSIS — G5603 Carpal tunnel syndrome, bilateral upper limbs: Secondary | ICD-10-CM | POA: Insufficient documentation

## 2018-07-11 DIAGNOSIS — M659 Synovitis and tenosynovitis, unspecified: Secondary | ICD-10-CM | POA: Diagnosis not present

## 2018-07-11 DIAGNOSIS — I10 Essential (primary) hypertension: Secondary | ICD-10-CM

## 2018-07-11 NOTE — Assessment & Plan Note (Signed)
Now worsening with right hand grip lessening, for hand surgury referral

## 2018-07-11 NOTE — Patient Instructions (Signed)
Please continue all other medications as before, and refills have been done if requested.  Please have the pharmacy call with any other refills you may need.  Please continue your efforts at being more active, low cholesterol diet, and weight control.  Please keep your appointments with your specialists as you may have planned  You will be contacted regarding the referral for: Hand Surgury

## 2018-07-11 NOTE — Assessment & Plan Note (Signed)
stable overall by history and exam, recent data reviewed with pt, and pt to continue medical treatment as before,  to f/u any worsening symptoms or concerns BP Readings from Last 3 Encounters:  07/11/18 116/66  04/14/18 106/68  03/21/18 138/89

## 2018-07-11 NOTE — Assessment & Plan Note (Signed)
Ok for tylenol prn, may need cortisone as well, for hand surgury

## 2018-07-11 NOTE — Progress Notes (Signed)
Subjective:    Patient ID: Ryan Diaz, male    DOB: July 18, 1946, 72 y.o.   MRN: [id number]  HPI  Here with 1-2 mo worsening right hand with wealkness that now has him quite concerned, is known to have bilat CTS and put off tx in the past, but now with trouble pinching movement and getting the wallet out of the pocket.  Has ongoing numbness to both thumbs and first fingers.  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 No hx of gout.  Pt denies chest pain, increased sob or doe, wheezing, orthopnea, PND, increased LE swelling, palpitations, dizziness or syncope.  Pt denies new neurological symptoms such as new headache, or facial or extremity weakness or numbness except for the above.   Pt denies polydipsia, polyuria.  Recent  B12 normal April 2019 Past Medical History:  Diagnosis Date  . 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 ALLERGIC RHINITIS 08/08/2007  . ANXIETY 08/08/2007  . Carpal tunnel syndrome   . COPD 10/03/2009  . DEPRESSION 08/08/2007  . ERECTILE DYSFUNCTION 08/08/2007  . GENITAL HERPES, HX OF 08/08/2007  . GERD 08/08/2007  . GLUCOSE INTOLERANCE 08/08/2007  . HYPERLIPIDEMIA 08/08/2007  . HYPERTENSION 08/08/2007  . Impaired glucose tolerance 09/26/2011  . Insomnia 05/18/2012  . PAIN IN SOFT TISSUES OF LIMB 10/02/2009   No past surgical history on file.  reports that he quit smoking about 8 years ago. His smoking use included cigarettes. He has a 52.50 pack-year smoking history. He has never used smokeless tobacco. He reports that he drinks about 46.0 - 53.0 standard drinks of alcohol per week. He reports that he has current or past drug history. Drug: Marijuana. family history includes Colon polyps in his brother. No Known Allergies Current Outpatient Medications on File Prior to Visit  Medication Sig Dispense  Refill  . albuterol (PROVENTIL HFA;VENTOLIN HFA) 108 (90 BASE) MCG/ACT inhaler Inhale 2 puffs into the lungs every 6 (six) hours as needed for wheezing or shortness of breath.    Marland Kitchen aspirin EC 81 MG tablet Take 81 mg by mouth daily.    Marland Kitchen atorvastatin (LIPITOR) 80 MG tablet Take 1 tablet (80 mg total) by mouth daily. 90 tablet 3  . diclofenac sodium (VOLTAREN) 1 % GEL Apply 4 g topically 4 (four) times daily as needed. 400 g 11  . fluticasone (FLONASE) 50 MCG/ACT nasal spray Place 2 sprays into both nostrils daily. 16 g 2  . lisinopril (PRINIVIL,ZESTRIL) 10 MG tablet Take 1 tablet (10 mg total) by mouth daily. 90 tablet 3  . metroNIDAZOLE (METROCREAM) 0.75 % cream Apply topically 2 (two) times daily. 45 g 1  . Omega-3 Fatty Acids (FISH OIL) 1000 MG CAPS Take 1,000 mg by mouth 2 (two) times daily.    Marland Kitchen omeprazole (PRILOSEC) 20 MG capsule Take 1 capsule (20 mg total) by mouth 2 (two) times daily. 90 capsule 3  . terazosin (HYTRIN) 2 MG capsule Take 1 capsule (2 mg total) by mouth at bedtime. 90 capsule 3  . tiotropium (SPIRIVA) 18 MCG inhalation capsule Place 18 mcg into inhaler and inhale daily.     No current facility-administered medications on file prior to visit.    Review of Systems  Constitutional: Negative for other unusual diaphoresis or sweats HENT: Negative for ear discharge or swelling Eyes:  Negative for other worsening visual disturbances Respiratory: Negative for stridor or other swelling  Gastrointestinal: Negative for worsening distension or other blood Genitourinary: Negative for retention or other urinary change Musculoskeletal: Negative for other MSK pain or swelling Skin: Negative for color change or other new lesions Neurological: Negative for worsening tremors and other numbness  Psychiatric/Behavioral: Negative for worsening agitation or other fatigue All other system neg per pt    Objective:   Physical Exam BP 116/66   Pulse 79   Temp 97.6 F (36.4 C) (Oral)   Ht  5' 11.5" (1.816 m)   Wt 169 lb (76.7 kg)   SpO2 96%   BMI 23.24 kg/m  VS noted,  Constitutional: Pt appears in NAD HENT: Head: NCAT.  Right Ear: External ear normal.  Left Ear: External ear normal.  Eyes: . Pupils are equal, round, and reactive to light. Conjunctivae and EOM are normal Nose: without d/c or deformity Neck: Neck supple. Gross normal ROM Cardiovascular: Normal rate and regular rhythm.   Pulmonary/Chest: Effort normal and breath sounds without rales or wheezing.  Abd:  Soft, NT, ND, + BS, no organomegaly Neurological: Pt is alert. At baseline orientation, motor grossly intact except for reduced right hand grip Right thumb with tender swelling along the first dorsal compartment to the wrist Skin: Skin is warm. No rashes, other new lesions, no LE edema Psychiatric: Pt behavior is normal without agitation  No other exam findings    Assessment & Plan:

## 2018-07-20 DIAGNOSIS — M79641 Pain in right hand: Secondary | ICD-10-CM | POA: Diagnosis not present

## 2018-07-20 DIAGNOSIS — G5603 Carpal tunnel syndrome, bilateral upper limbs: Secondary | ICD-10-CM | POA: Diagnosis not present

## 2018-07-20 DIAGNOSIS — M19031 Primary osteoarthritis, right wrist: Secondary | ICD-10-CM | POA: Diagnosis not present

## 2018-07-20 DIAGNOSIS — M19032 Primary osteoarthritis, left wrist: Secondary | ICD-10-CM | POA: Diagnosis not present

## 2018-07-20 DIAGNOSIS — M79642 Pain in left hand: Secondary | ICD-10-CM | POA: Diagnosis not present

## 2018-08-03 DIAGNOSIS — G5603 Carpal tunnel syndrome, bilateral upper limbs: Secondary | ICD-10-CM | POA: Diagnosis not present

## 2018-08-04 ENCOUNTER — Other Ambulatory Visit: Payer: Self-pay | Admitting: Orthopedic Surgery

## 2018-08-15 ENCOUNTER — Encounter (HOSPITAL_BASED_OUTPATIENT_CLINIC_OR_DEPARTMENT_OTHER): Payer: Self-pay | Admitting: *Deleted

## 2018-08-15 ENCOUNTER — Other Ambulatory Visit: Payer: Self-pay

## 2018-08-17 ENCOUNTER — Encounter (HOSPITAL_BASED_OUTPATIENT_CLINIC_OR_DEPARTMENT_OTHER)
Admission: RE | Admit: 2018-08-17 | Discharge: 2018-08-17 | Disposition: A | Discharge status: Home / Self Care | Payer: PPO | Source: Ambulatory Visit | Attending: Orthopedic Surgery | Admitting: Orthopedic Surgery

## 2018-08-17 DIAGNOSIS — J449 Chronic obstructive pulmonary disease, unspecified: Secondary | ICD-10-CM | POA: Diagnosis not present

## 2018-08-17 DIAGNOSIS — E785 Hyperlipidemia, unspecified: Secondary | ICD-10-CM | POA: Diagnosis not present

## 2018-08-17 DIAGNOSIS — K219 Gastro-esophageal reflux disease without esophagitis: Secondary | ICD-10-CM | POA: Diagnosis not present

## 2018-08-17 DIAGNOSIS — G4733 Obstructive sleep apnea (adult) (pediatric): Secondary | ICD-10-CM | POA: Diagnosis not present

## 2018-08-17 DIAGNOSIS — F329 Major depressive disorder, single episode, unspecified: Secondary | ICD-10-CM | POA: Diagnosis not present

## 2018-08-17 DIAGNOSIS — G5601 Carpal tunnel syndrome, right upper limb: Secondary | ICD-10-CM | POA: Diagnosis not present

## 2018-08-17 DIAGNOSIS — Z87891 Personal history of nicotine dependence: Secondary | ICD-10-CM | POA: Diagnosis not present

## 2018-08-17 DIAGNOSIS — F419 Anxiety disorder, unspecified: Secondary | ICD-10-CM | POA: Diagnosis not present

## 2018-08-17 DIAGNOSIS — Z7982 Long term (current) use of aspirin: Secondary | ICD-10-CM | POA: Diagnosis not present

## 2018-08-17 DIAGNOSIS — I10 Essential (primary) hypertension: Secondary | ICD-10-CM | POA: Diagnosis not present

## 2018-08-17 DIAGNOSIS — Z79899 Other long term (current) drug therapy: Secondary | ICD-10-CM | POA: Diagnosis not present

## 2018-08-17 DIAGNOSIS — N529 Male erectile dysfunction, unspecified: Secondary | ICD-10-CM | POA: Diagnosis not present

## 2018-08-17 NOTE — Progress Notes (Signed)
EKG reviewed by Dr. Germeroth, will proceed with surgery as scheduled. 

## 2018-08-18 ENCOUNTER — Ambulatory Visit (HOSPITAL_BASED_OUTPATIENT_CLINIC_OR_DEPARTMENT_OTHER)
Admission: RE | Admit: 2018-08-18 | Discharge: 2018-08-18 | Disposition: A | Discharge status: Home / Self Care | Payer: PPO | Source: Ambulatory Visit | Attending: Orthopedic Surgery | Admitting: Orthopedic Surgery

## 2018-08-18 ENCOUNTER — Encounter (HOSPITAL_BASED_OUTPATIENT_CLINIC_OR_DEPARTMENT_OTHER): Payer: Self-pay | Admitting: Anesthesiology

## 2018-08-18 ENCOUNTER — Ambulatory Visit (HOSPITAL_BASED_OUTPATIENT_CLINIC_OR_DEPARTMENT_OTHER): Payer: PPO | Admitting: Anesthesiology

## 2018-08-18 ENCOUNTER — Encounter (HOSPITAL_BASED_OUTPATIENT_CLINIC_OR_DEPARTMENT_OTHER)
Admission: RE | Disposition: A | Discharge status: Home / Self Care | Payer: Self-pay | Source: Ambulatory Visit | Attending: Orthopedic Surgery

## 2018-08-18 ENCOUNTER — Other Ambulatory Visit: Payer: Self-pay

## 2018-08-18 DIAGNOSIS — Z79899 Other long term (current) drug therapy: Secondary | ICD-10-CM | POA: Diagnosis not present

## 2018-08-18 DIAGNOSIS — I10 Essential (primary) hypertension: Secondary | ICD-10-CM | POA: Diagnosis not present

## 2018-08-18 DIAGNOSIS — J449 Chronic obstructive pulmonary disease, unspecified: Secondary | ICD-10-CM | POA: Insufficient documentation

## 2018-08-18 DIAGNOSIS — F329 Major depressive disorder, single episode, unspecified: Secondary | ICD-10-CM | POA: Diagnosis not present

## 2018-08-18 DIAGNOSIS — N529 Male erectile dysfunction, unspecified: Secondary | ICD-10-CM | POA: Insufficient documentation

## 2018-08-18 DIAGNOSIS — E785 Hyperlipidemia, unspecified: Secondary | ICD-10-CM | POA: Insufficient documentation

## 2018-08-18 DIAGNOSIS — Z7982 Long term (current) use of aspirin: Secondary | ICD-10-CM | POA: Diagnosis not present

## 2018-08-18 DIAGNOSIS — G4733 Obstructive sleep apnea (adult) (pediatric): Secondary | ICD-10-CM | POA: Insufficient documentation

## 2018-08-18 DIAGNOSIS — Z87891 Personal history of nicotine dependence: Secondary | ICD-10-CM | POA: Insufficient documentation

## 2018-08-18 DIAGNOSIS — K219 Gastro-esophageal reflux disease without esophagitis: Secondary | ICD-10-CM | POA: Diagnosis not present

## 2018-08-18 DIAGNOSIS — F419 Anxiety disorder, unspecified: Secondary | ICD-10-CM | POA: Insufficient documentation

## 2018-08-18 DIAGNOSIS — G5601 Carpal tunnel syndrome, right upper limb: Secondary | ICD-10-CM | POA: Diagnosis not present

## 2018-08-18 HISTORY — PX: CARPAL TUNNEL RELEASE: SHX101

## 2018-08-18 HISTORY — DX: Sleep apnea, unspecified: G47.30

## 2018-08-18 SURGERY — CARPAL TUNNEL RELEASE
Anesthesia: Regional | Site: Wrist | Laterality: Right

## 2018-08-18 MED ORDER — HYDROCODONE-ACETAMINOPHEN 5-325 MG PO TABS: ORAL_TABLET | ORAL | 0 refills | Status: DC

## 2018-08-18 MED ORDER — LIDOCAINE HCL (PF) 0.5 % IJ SOLN
INTRAMUSCULAR | Status: DC | PRN
  Administered 2018-08-18: 30 mL via INTRAVENOUS

## 2018-08-18 MED ORDER — ONDANSETRON HCL 4 MG/2ML IJ SOLN
INTRAMUSCULAR | Status: AC
  Filled 2018-08-18: qty 2

## 2018-08-18 MED ORDER — FENTANYL CITRATE (PF) 100 MCG/2ML IJ SOLN
50.0000 ug | INTRAMUSCULAR | Status: DC | PRN
  Administered 2018-08-18: 50 ug via INTRAVENOUS

## 2018-08-18 MED ORDER — CEFAZOLIN SODIUM-DEXTROSE 2-4 GM/100ML-% IV SOLN
2.0000 g | INTRAVENOUS | Status: AC
  Administered 2018-08-18: 2 g via INTRAVENOUS

## 2018-08-18 MED ORDER — MIDAZOLAM HCL 2 MG/2ML IJ SOLN
INTRAMUSCULAR | Status: AC
  Filled 2018-08-18: qty 2

## 2018-08-18 MED ORDER — CEFAZOLIN SODIUM-DEXTROSE 2-4 GM/100ML-% IV SOLN
INTRAVENOUS | Status: AC
  Filled 2018-08-18: qty 100

## 2018-08-18 MED ORDER — OXYCODONE HCL 5 MG/5ML PO SOLN: 5.0000 mg | Freq: Once | ORAL | Status: DC | PRN

## 2018-08-18 MED ORDER — FENTANYL CITRATE (PF) 100 MCG/2ML IJ SOLN: 25.0000 ug | INTRAMUSCULAR | Status: DC | PRN

## 2018-08-18 MED ORDER — BUPIVACAINE HCL (PF) 0.25 % IJ SOLN
INTRAMUSCULAR | Status: DC | PRN
  Administered 2018-08-18: 9 mL

## 2018-08-18 MED ORDER — DEXAMETHASONE SODIUM PHOSPHATE 10 MG/ML IJ SOLN
INTRAMUSCULAR | Status: DC | PRN
  Administered 2018-08-18: 5 mg via INTRAVENOUS

## 2018-08-18 MED ORDER — ONDANSETRON HCL 4 MG/2ML IJ SOLN
INTRAMUSCULAR | Status: DC | PRN
  Administered 2018-08-18: 4 mg via INTRAVENOUS

## 2018-08-18 MED ORDER — OXYCODONE HCL 5 MG PO TABS: 5.0000 mg | ORAL_TABLET | Freq: Once | ORAL | Status: DC | PRN

## 2018-08-18 MED ORDER — CHLORHEXIDINE GLUCONATE 4 % EX LIQD: 60.0000 mL | Freq: Once | CUTANEOUS | Status: DC

## 2018-08-18 MED ORDER — PROPOFOL 10 MG/ML IV BOLUS
INTRAVENOUS | Status: DC | PRN
  Administered 2018-08-18 (×3): 20 mg via INTRAVENOUS

## 2018-08-18 MED ORDER — MIDAZOLAM HCL 2 MG/2ML IJ SOLN
1.0000 mg | INTRAMUSCULAR | Status: DC | PRN
  Administered 2018-08-18: 1 mg via INTRAVENOUS

## 2018-08-18 MED ORDER — DEXAMETHASONE SODIUM PHOSPHATE 10 MG/ML IJ SOLN
INTRAMUSCULAR | Status: AC
  Filled 2018-08-18: qty 1

## 2018-08-18 MED ORDER — FENTANYL CITRATE (PF) 100 MCG/2ML IJ SOLN
INTRAMUSCULAR | Status: AC
  Filled 2018-08-18: qty 2

## 2018-08-18 MED ORDER — LACTATED RINGERS IV SOLN
INTRAVENOUS | Status: DC
  Administered 2018-08-18: 10:00:00 via INTRAVENOUS

## 2018-08-18 MED ORDER — LIDOCAINE 2% (20 MG/ML) 5 ML SYRINGE
INTRAMUSCULAR | Status: AC
  Filled 2018-08-18: qty 5

## 2018-08-18 MED ORDER — ONDANSETRON HCL 4 MG/2ML IJ SOLN: 4.0000 mg | Freq: Once | INTRAMUSCULAR | Status: DC | PRN

## 2018-08-18 MED ORDER — SCOPOLAMINE 1 MG/3DAYS TD PT72: 1.0000 | MEDICATED_PATCH | Freq: Once | TRANSDERMAL | Status: DC | PRN

## 2018-08-18 SURGICAL SUPPLY — 35 items
BANDAGE ACE 3X5.8 VEL STRL LF (GAUZE/BANDAGES/DRESSINGS) ×2 IMPLANT
BLADE SURG 15 STRL LF DISP TIS (BLADE) ×2 IMPLANT
BLADE SURG 15 STRL SS (BLADE) ×2
BNDG ESMARK 4X9 LF (GAUZE/BANDAGES/DRESSINGS) IMPLANT
BNDG GAUZE ELAST 4 BULKY (GAUZE/BANDAGES/DRESSINGS) ×2 IMPLANT
CHLORAPREP W/TINT 26ML (MISCELLANEOUS) ×2 IMPLANT
CORD BIPOLAR FORCEPS 12FT (ELECTRODE) ×2 IMPLANT
COVER BACK TABLE 60X90IN (DRAPES) ×2 IMPLANT
COVER MAYO STAND STRL (DRAPES) ×2 IMPLANT
CUFF TOURNIQUET SINGLE 18IN (TOURNIQUET CUFF) ×2 IMPLANT
DRAPE EXTREMITY T 121X128X90 (DRAPE) ×2 IMPLANT
DRAPE SURG 17X23 STRL (DRAPES) ×2 IMPLANT
DRSG PAD ABDOMINAL 8X10 ST (GAUZE/BANDAGES/DRESSINGS) ×2 IMPLANT
GAUZE SPONGE 4X4 12PLY STRL (GAUZE/BANDAGES/DRESSINGS) ×2 IMPLANT
GAUZE XEROFORM 1X8 LF (GAUZE/BANDAGES/DRESSINGS) ×2 IMPLANT
GLOVE BIO SURGEON STRL SZ 6.5 (GLOVE) ×2 IMPLANT
GLOVE BIO SURGEON STRL SZ7.5 (GLOVE) ×2 IMPLANT
GLOVE BIOGEL PI IND STRL 7.0 (GLOVE) ×2 IMPLANT
GLOVE BIOGEL PI IND STRL 8 (GLOVE) ×1 IMPLANT
GLOVE BIOGEL PI INDICATOR 7.0 (GLOVE) ×2
GLOVE BIOGEL PI INDICATOR 8 (GLOVE) ×1
GOWN STRL REUS W/ TWL LRG LVL3 (GOWN DISPOSABLE) ×1 IMPLANT
GOWN STRL REUS W/TWL LRG LVL3 (GOWN DISPOSABLE) ×1
GOWN STRL REUS W/TWL XL LVL3 (GOWN DISPOSABLE) ×2 IMPLANT
NEEDLE HYPO 25X1 1.5 SAFETY (NEEDLE) ×2 IMPLANT
NS IRRIG 1000ML POUR BTL (IV SOLUTION) ×2 IMPLANT
PACK BASIN DAY SURGERY FS (CUSTOM PROCEDURE TRAY) ×2 IMPLANT
PADDING CAST ABS 4INX4YD NS (CAST SUPPLIES) ×1
PADDING CAST ABS COTTON 4X4 ST (CAST SUPPLIES) ×1 IMPLANT
STOCKINETTE 4X48 STRL (DRAPES) ×2 IMPLANT
SUT ETHILON 4 0 PS 2 18 (SUTURE) ×2 IMPLANT
SYR BULB 3OZ (MISCELLANEOUS) ×2 IMPLANT
SYR CONTROL 10ML LL (SYRINGE) ×2 IMPLANT
TOWEL GREEN STERILE FF (TOWEL DISPOSABLE) ×4 IMPLANT
UNDERPAD 30X30 (UNDERPADS AND DIAPERS) ×2 IMPLANT

## 2018-08-18 NOTE — H&P (Signed)
  Ryan Diaz is an 72 y.o. male.   Chief Complaint: right carpal tunnel syndrome HPI: 72 yo male with numbness and tingling in digits.  Positive nerve conduction studies.  He wishes to have a carpal tunnel release.  Allergies: No Known Allergies  Past Medical History:  Diagnosis Date  . 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 ALLERGIC RHINITIS 08/08/2007  . ANXIETY 08/08/2007  . Carpal tunnel syndrome   . COPD 10/03/2009  . DEPRESSION 08/08/2007  . ERECTILE DYSFUNCTION 08/08/2007  . GENITAL HERPES, HX OF 08/08/2007  . GERD 08/08/2007  . GLUCOSE INTOLERANCE 08/08/2007  . HYPERLIPIDEMIA 08/08/2007  . HYPERTENSION 08/08/2007  . Impaired glucose tolerance 09/26/2011  . Insomnia 05/18/2012  . PAIN IN SOFT TISSUES OF LIMB 10/02/2009  . Sleep apnea    OSA-uses CPAP nightly    Past Surgical History:  Procedure Laterality Date  . ACHILLES TENDON REPAIR Bilateral   . COLONOSCOPY      Family History: Family History  Problem Relation Age of Onset  . Colon polyps Brother     Social History:   reports that he quit smoking about 8 years ago. His smoking use included cigarettes. He has a 52.50 pack-year smoking history. He has never used smokeless tobacco. He reports that he drinks about 46.0 - 53.0 standard drinks of alcohol per week. He reports that he has current or past drug history. Drug: Marijuana.  Medications: Medications Prior to Admission  Medication Sig Dispense Refill  . albuterol (PROVENTIL HFA;VENTOLIN HFA) 108 (90 BASE) MCG/ACT inhaler Inhale 2 puffs into the lungs every 6 (six) hours as needed for wheezing or shortness of breath.    Marland Kitchen aspirin EC 81 MG tablet Take 81 mg by mouth daily.    Marland Kitchen atorvastatin (LIPITOR) 80 MG tablet Take 1 tablet (80 mg total) by mouth daily. 90 tablet 3  . lisinopril (PRINIVIL,ZESTRIL) 10 MG tablet Take 1 tablet (10 mg total) by mouth daily. 90 tablet 3  . Omega-3 Fatty Acids (FISH OIL) 1000 MG CAPS Take 1,000 mg by mouth 2 (two) times daily.    Marland Kitchen  omeprazole (PRILOSEC) 20 MG capsule Take 1 capsule (20 mg total) by mouth 2 (two) times daily. 90 capsule 3  . terazosin (HYTRIN) 2 MG capsule Take 1 capsule (2 mg total) by mouth at bedtime. 90 capsule 3  . tiotropium (SPIRIVA) 18 MCG inhalation capsule Place 18 mcg into inhaler and inhale daily.    . fluticasone (FLONASE) 50 MCG/ACT nasal spray Place 2 sprays into both nostrils daily. 16 g 2    No results found for this or any previous visit (from the past 48 hour(s)).  No results found.   A comprehensive review of systems was negative.  Height 5\' 11"  (1.803 m), weight 77.6 kg.  General appearance: alert, cooperative and appears stated age Head: Normocephalic, without obvious abnormality, atraumatic Neck: supple, symmetrical, trachea midline Cardio: regular rate and rhythm Resp: clear to auscultation bilaterally Extremities: Decreased sensation thumb, index, long fingers otherwise intact.  Intact capillary refill all digits.  +epl/fpl/io.  No wounds.  Pulses: 2+ and symmetric Skin: Skin color, texture, turgor normal. No rashes or lesions Neurologic: Grossly normal Incision/Wound: none  Assessment/Plan Right carpal tunnel syndrome.  Non operative and operative treatment options were discussed with the patient and patient wishes to proceed with operative treatment. Risks, benefits, and alternatives of surgery were discussed and the patient agrees with the plan of care.   Betha Loa 08/18/2018, 9:55 AM

## 2018-08-18 NOTE — Anesthesia Preprocedure Evaluation (Addendum)
Anesthesia Evaluation    History of Anesthesia Complications Negative for: history of anesthetic complications  Airway Mallampati: III  TM Distance: >3 FB Neck ROM: Full    Dental  (+) Upper Dentures, Partial Lower   Pulmonary sleep apnea and Continuous Positive Airway Pressure Ventilation , COPD,  COPD inhaler, former smoker,     + decreased breath sounds(-) wheezing      Cardiovascular hypertension, Pt. on medications  Rhythm:Regular Rate:Normal     Neuro/Psych negative neurological ROS  negative psych ROS   GI/Hepatic GERD  Medicated,(+)     substance abuse  alcohol use and marijuana use,   Endo/Other  negative endocrine ROS  Renal/GU negative Renal ROS  negative genitourinary   Musculoskeletal negative musculoskeletal ROS (+)   Abdominal   Peds  Hematology negative hematology ROS (+)   Anesthesia Other Findings   Reproductive/Obstetrics                            Anesthesia Physical Anesthesia Plan  ASA: III  Anesthesia Plan: Bier Block and Bier Block-LIDOCAINE ONLY   Post-op Pain Management:    Induction:   PONV Risk Score and Plan: 1 and Propofol infusion and Treatment may vary due to age or medical condition  Airway Management Planned: Natural Airway, Nasal Cannula and Simple Face Mask  Additional Equipment: None  Intra-op Plan:   Post-operative Plan:   Informed Consent: I have reviewed the patients History and Physical, chart, labs and discussed the procedure including the risks, benefits and alternatives for the proposed anesthesia with the patient or authorized representative who has indicated his/her understanding and acceptance.     Plan Discussed with:   Anesthesia Plan Comments:        Anesthesia Quick Evaluation

## 2018-08-18 NOTE — Transfer of Care (Signed)
Immediate Anesthesia Transfer of Care Note  Patient: ZYKEE AVAKIAN  Procedure(s) Performed: RIGHT CARPAL TUNNEL RELEASE (Right Wrist)  Patient Location: PACU  Anesthesia Type:MAC and Bier block  Level of Consciousness: awake and sedated  Airway & Oxygen Therapy: Patient Spontanous Breathing and Patient connected to face mask oxygen  Post-op Assessment: Report given to RN and Post -op Vital signs reviewed and stable  Post vital signs: Reviewed and stable  Last Vitals:  Vitals Value Taken Time  BP    Temp    Pulse 64 08/18/2018 12:15 PM  Resp 9 08/18/2018 12:15 PM  SpO2 100 % 08/18/2018 12:15 PM  Vitals shown include unvalidated device data.  Last Pain:  Vitals:   08/18/18 1001  TempSrc: Oral  PainSc: 0-No pain         Complications: No apparent anesthesia complications

## 2018-08-18 NOTE — Anesthesia Postprocedure Evaluation (Signed)
Anesthesia Post Note  Patient: Ryan Diaz  Procedure(s) Performed: RIGHT CARPAL TUNNEL RELEASE (Right Wrist)     Patient location during evaluation: PACU Anesthesia Type: Bier Block Level of consciousness: awake and alert Pain management: pain level controlled Vital Signs Assessment: post-procedure vital signs reviewed and stable Respiratory status: spontaneous breathing, nonlabored ventilation, respiratory function stable and patient connected to nasal cannula oxygen Cardiovascular status: stable and blood pressure returned to baseline Postop Assessment: no apparent nausea or vomiting Anesthetic complications: no    Last Vitals:  Vitals:   08/18/18 1245 08/18/18 1301  BP: (!) 147/74 139/74  Pulse: (!) 57 63  Resp: (!) 9 16  Temp:  36.4 C  SpO2: 97% 97%    Last Pain:  Vitals:   08/18/18 1301  TempSrc: Oral  PainSc: 0-No pain                 Lucretia Kern

## 2018-08-18 NOTE — Discharge Instructions (Addendum)
[facility] Instructions °Hand Surgery ° °Wound Care: °Keep your hand elevated above the level of your heart.  Do not allow it to dangle by your side.  Keep the dressing dry and do not remove it unless your doctor advises you to do so.  He will usually change it at the time of your post-op visit.  Moving your fingers is advised to stimulate circulation but will depend on the site of your surgery.  If you have a splint applied, your doctor will advise you regarding movement. ° °Activity: °Do not drive or operate machinery today.  Rest today and then you may return to your normal activity and work as indicated by your physician. ° °Diet:  °Drink liquids today or eat a light diet.  You may resume a regular diet tomorrow.   ° °General expectations: °Pain for two to three days. °Fingers may become slightly swollen. ° °Call your doctor if any of the following occur: °Severe pain not relieved by pain medication. °Elevated temperature. °Dressing soaked with blood. °Inability to move fingers. °[place] or bluish color to fingers. ° ° °Post Anesthesia Home Care Instructions ° °Activity: °Get plenty of rest for the remainder of the day. A responsible individual must stay with you for 24 hours following the procedure.  °For the next 24 hours, DO NOT: °-Drive a car °-Operate machinery °-Drink alcoholic beverages °-Take any medication unless instructed by your physician °-Make any legal decisions or sign important papers. ° °Meals: °Start with liquid foods such as gelatin or soup. Progress to regular foods as tolerated. Avoid greasy, spicy, heavy foods. If nausea and/or vomiting occur, drink only clear liquids until the nausea and/or vomiting subsides. Call your physician if vomiting continues. ° °Special Instructions/Symptoms: °Your throat may feel dry or sore from the anesthesia or the breathing tube placed in your throat during surgery. If this causes discomfort, gargle with warm salt water. The discomfort should disappear within  24 hours. ° °If you had a scopolamine patch placed behind your ear for the management of post- operative nausea and/or vomiting: ° °1. The medication in the patch is effective for 72 hours, after which it should be removed.  Wrap patch in a tissue and discard in the trash. Wash hands thoroughly with soap and water. °2. You may remove the patch earlier than 72 hours if you experience unpleasant side effects which may include dry mouth, dizziness or visual disturbances. °3. Avoid touching the patch. Wash your hands with soap and water after contact with the patch. °  ° ° °

## 2018-08-18 NOTE — Op Note (Signed)
08/18/2018 [facility]                              OPERATIVE REPORT   PREOPERATIVE DIAGNOSIS:  Right carpal tunnel syndrome.  POSTOPERATIVE DIAGNOSIS:  Right carpal tunnel syndrome.  PROCEDURE:  Right carpal tunnel release.  SURGEON:  Betha Loa, MD  ASSISTANT:  none.  ANESTHESIA: Bier block with sedation  IV FLUIDS:  Per anesthesia flow sheet.  ESTIMATED BLOOD LOSS:  Minimal.  COMPLICATIONS:  None.  SPECIMENS:  None.  TOURNIQUET TIME:    Total Tourniquet Time Documented: Forearm (Right) - 26 minutes Total: Forearm (Right) - 26 minutes   DISPOSITION:  Stable to PACU.  LOCATION: [facility]  INDICATIONS:  72 yo male with numbness and tingling in hands.  Positive nerve conduction studies.  He wishes to have a carpal tunnel release for management of his symptoms.  Risks, benefits and alternatives of surgery were discussed including the risk of blood loss; infection; damage to nerves, vessels, tendons, ligaments, bone; failure of surgery; need for additional surgery; complications with wound healing; continued pain; recurrence of carpal tunnel syndrome; and damage to motor branch. He voiced understanding of these risks and elected to proceed.   OPERATIVE COURSE:  After being identified preoperatively by myself, the patient and I agreed upon the procedure and site of procedure.  The surgical site was marked.  The risks, benefits, and alternatives of the surgery were reviewed and he wished to proceed.  Surgical consent had been signed.  He was given IV Ancef as preoperative antibiotic prophylaxis.  He was transferred to the operating room and placed on the operating room table in supine position with the Right upper extremity on an armboard.  Bier block anesthesia was induced by the anesthesiologist.  Right upper extremity was prepped and draped in normal sterile orthopaedic fashion.  A surgical pause was performed between the surgeons, anesthesia, and  operating room staff, and all were in agreement as to the patient, procedure, and site of procedure.  Tourniquet at the proximal aspect of the forearm had been inflated for the Bier block  Incision was made over the transverse carpal ligament and carried into the subcutaneous tissues by spreading technique.  Bipolar electrocautery was used to obtain hemostasis.  The palmar fascia was sharply incised.  The transverse carpal ligament was identified and sharply incised.  It was incised distally first.  Care was taken to ensure complete decompression distally.  It was then incised proximally.  Scissors were used to split the distal aspect of the volar antebrachial fascia.  A finger was placed into the wound to ensure complete decompression, which was the case.  The nerve was examined.  It was adherent to the radial leaflet.  The motor branch was identified and was intact.  The wound was copiously irrigated with sterile saline.  It was then closed with 4-0 nylon in a horizontal mattress fashion.  It was injected with 0.25% plain Marcaine to aid in postoperative analgesia.  It was dressed with sterile Xeroform, 4x4s, an ABD, and wrapped with Kerlix and an Ace bandage.  Tourniquet was deflated at 26 minutes.  Fingertips were pink with brisk capillary refill after deflation of the tourniquet.  Operative drapes were broken down.  The patient was awoken from anesthesia safely.  He was transferred back to stretcher and taken to the PACU in stable condition.  I will see him back in the office  in 1 week for postoperative followup.  I will give him a prescription for Norco 5/325 1-2 tabs PO q6 hours prn pain, dispense # 20.    Betha Loa, MD Electronically signed, 08/18/18

## 2018-08-19 ENCOUNTER — Encounter (HOSPITAL_BASED_OUTPATIENT_CLINIC_OR_DEPARTMENT_OTHER): Payer: Self-pay | Admitting: Orthopedic Surgery

## 2018-09-08 ENCOUNTER — Ambulatory Visit: Payer: PPO | Admitting: Internal Medicine

## 2018-10-19 ENCOUNTER — Other Ambulatory Visit (INDEPENDENT_AMBULATORY_CARE_PROVIDER_SITE_OTHER): Payer: PPO

## 2018-10-19 ENCOUNTER — Ambulatory Visit (INDEPENDENT_AMBULATORY_CARE_PROVIDER_SITE_OTHER): Payer: PPO | Admitting: Internal Medicine

## 2018-10-19 ENCOUNTER — Encounter: Payer: Self-pay | Admitting: Internal Medicine

## 2018-10-19 VITALS — BP 118/64 | HR 97 | Temp 98.0°F | Ht 71.0 in | Wt 168.0 lb

## 2018-10-19 DIAGNOSIS — I10 Essential (primary) hypertension: Secondary | ICD-10-CM

## 2018-10-19 DIAGNOSIS — R7302 Impaired glucose tolerance (oral): Secondary | ICD-10-CM | POA: Diagnosis not present

## 2018-10-19 DIAGNOSIS — E785 Hyperlipidemia, unspecified: Secondary | ICD-10-CM

## 2018-10-19 DIAGNOSIS — H538 Other visual disturbances: Secondary | ICD-10-CM | POA: Diagnosis not present

## 2018-10-19 LAB — LIPID PANEL
Cholesterol: 143 mg/dL (ref 0–200)
HDL: 39.1 mg/dL (ref >39.00)
NonHDL: 104.08
Total CHOL/HDL Ratio: 4
Triglycerides: 222 mg/dL — ABNORMAL HIGH (ref 0.0–149.0)
VLDL: 44.4 mg/dL — ABNORMAL HIGH (ref 0.0–40.0)

## 2018-10-19 LAB — BASIC METABOLIC PANEL
BUN: 18 mg/dL (ref 6–23)
CO2: 25 mEq/L (ref 19–32)
Calcium: 9.7 mg/dL (ref 8.4–10.5)
Chloride: 104 mEq/L (ref 96–112)
Creatinine, Ser: 1.09 mg/dL (ref 0.40–1.50)
GFR: 85.36 mL/min (ref >60.00)
Glucose, Bld: 96 mg/dL (ref 70–99)
Potassium: 4.1 mEq/L (ref 3.5–5.1)
Sodium: 138 mEq/L (ref 135–145)

## 2018-10-19 LAB — HEPATIC FUNCTION PANEL
ALT: 34 U/L (ref 0–53)
AST: 26 U/L (ref 0–37)
Albumin: 4.6 g/dL (ref 3.5–5.2)
Alkaline Phosphatase: 75 U/L (ref 39–117)
Bilirubin, Direct: 0.1 mg/dL (ref 0.0–0.3)
Total Bilirubin: 0.4 mg/dL (ref 0.2–1.2)
Total Protein: 7.7 g/dL (ref 6.0–8.3)

## 2018-10-19 LAB — HEMOGLOBIN A1C: Hgb A1c MFr Bld: 5.8 % (ref 4.6–6.5)

## 2018-10-19 LAB — LDL CHOLESTEROL, DIRECT: Direct LDL: 83 mg/dL

## 2018-10-19 NOTE — Progress Notes (Signed)
Subjective:    Patient ID: Ryan Diaz, male    DOB: 11-Oct-1946, 72 y.o.   MRN: [id number]  HPI  Here to f/u; overall doing ok,  Pt denies chest pain, increasing sob or doe, wheezing, orthopnea, PND, increased LE swelling, palpitations, dizziness or syncope.  Pt denies new neurological symptoms such as new headache, or facial or extremity weakness or numbness.  Pt denies polydipsia, polyuria, or low sugar episode.  Pt states overall good compliance with meds, mostly trying to follow appropriate diet, with wt overall stable,  but little exercise however.  S/p recent right CTS surgury with Dr Merlyn Lot, doing well but still the palm some sensitive.  Still has much of his routine care at the [facility], but was not impressed with his last optho exam, asks for referral for blurred vision.   Past Medical History:  Diagnosis Date  . 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 ALLERGIC RHINITIS 08/08/2007  . ANXIETY 08/08/2007  . Carpal tunnel syndrome   . COPD 10/03/2009  . DEPRESSION 08/08/2007  . ERECTILE DYSFUNCTION 08/08/2007  . GENITAL HERPES, HX OF 08/08/2007  . GERD 08/08/2007  . GLUCOSE INTOLERANCE 08/08/2007  . HYPERLIPIDEMIA 08/08/2007  . HYPERTENSION 08/08/2007  . Impaired glucose tolerance 09/26/2011  . Insomnia 05/18/2012  . PAIN IN SOFT TISSUES OF LIMB 10/02/2009  . Sleep apnea    OSA-uses CPAP nightly   Past Surgical History:  Procedure Laterality Date  . ACHILLES TENDON REPAIR Bilateral   . CARPAL TUNNEL RELEASE Right 08/18/2018   Procedure: RIGHT CARPAL TUNNEL RELEASE;  Surgeon: Betha Loa, MD;  Location: [facility];  Service: Orthopedics;  Laterality: Right;  . COLONOSCOPY      reports that he quit smoking about 9 years ago. His smoking use included cigarettes. He has a 52.50 pack-year smoking history. He has never used smokeless tobacco. He reports that he drinks about 46.0 - 53.0 standard drinks of alcohol per week. He reports that he has current or past drug history. Drug:  Marijuana. family history includes Colon polyps in his brother. No Known Allergies Current Outpatient Medications on File Prior to Visit  Medication Sig Dispense Refill  . albuterol (PROVENTIL HFA;VENTOLIN HFA) 108 (90 BASE) MCG/ACT inhaler Inhale 2 puffs into the lungs every 6 (six) hours as needed for wheezing or shortness of breath.    Marland Kitchen aspirin EC 81 MG tablet Take 81 mg by mouth daily.    Marland Kitchen atorvastatin (LIPITOR) 80 MG tablet Take 1 tablet (80 mg total) by mouth daily. 90 tablet 3  . fluticasone (FLONASE) 50 MCG/ACT nasal spray Place 2 sprays into both nostrils daily. 16 g 2  . HYDROcodone-acetaminophen (NORCO) 5-325 MG tablet 1-2 tabs po q6 hours prn pain 20 tablet 0  . lisinopril (PRINIVIL,ZESTRIL) 10 MG tablet Take 1 tablet (10 mg total) by mouth daily. 90 tablet 3  . Omega-3 Fatty Acids (FISH OIL) 1000 MG CAPS Take 1,000 mg by mouth 2 (two) times daily.    Marland Kitchen omeprazole (PRILOSEC) 20 MG capsule Take 1 capsule (20 mg total) by mouth 2 (two) times daily. 90 capsule 3  . terazosin (HYTRIN) 2 MG capsule Take 1 capsule (2 mg total) by mouth at bedtime. 90 capsule 3  . tiotropium (SPIRIVA) 18 MCG inhalation capsule Place 18 mcg into inhaler and inhale daily.     No current facility-administered medications on file prior to visit.    Review of Systems  Constitutional: Negative for other unusual diaphoresis or sweats HENT: Negative for  ear discharge or swelling Eyes: Negative for other worsening visual disturbances Respiratory: Negative for stridor or other swelling  Gastrointestinal: Negative for worsening distension or other blood Genitourinary: Negative for retention or other urinary change Musculoskeletal: Negative for other MSK pain or swelling Skin: Negative for color change or other new lesions Neurological: Negative for worsening tremors and other numbness  Psychiatric/Behavioral: Negative for worsening agitation or other fatigue All other system neg per pt    Objective:    Physical Exam BP 118/64   Pulse 97   Temp 98 F (36.7 C) (Oral)   Ht 5\' 11"  (1.803 m)   Wt 168 lb (76.2 kg)   SpO2 93%   BMI 23.43 kg/m  VS noted,  Constitutional: Pt appears in NAD HENT: Head: NCAT.  Right Ear: External ear normal.  Left Ear: External ear normal.  Eyes: . Pupils are equal, round, and reactive to light. Conjunctivae and EOM are normal Nose: without d/c or deformity Neck: Neck supple. Gross normal ROM Cardiovascular: Normal rate and regular rhythm.   Pulmonary/Chest: Effort normal and breath sounds without rales or wheezing.  Abd:  Soft, NT, ND, + BS, no organomegaly Neurological: Pt is alert. At baseline orientation, motor grossly intact Skin: Skin is warm. No rashes, other new lesions, no LE edema Psychiatric: Pt behavior is normal without agitation  No other exam findings  Lab Results  Component Value Date   WBC 6.7 03/09/2018   HGB 15.0 03/09/2018   HCT 43.0 03/09/2018   PLT 300.0 03/09/2018   GLUCOSE 89 03/09/2018   CHOL 123 03/09/2018   TRIG 176.0 (H) 03/09/2018   HDL 34.50 (L) 03/09/2018   LDLDIRECT 96.7 01/06/2013   LDLCALC 54 03/09/2018   ALT 30 03/09/2018   AST 23 03/09/2018   NA 136 03/09/2018   K 4.1 03/09/2018   CL 103 03/09/2018   CREATININE 1.03 03/09/2018   BUN 15 03/09/2018   CO2 25 03/09/2018   TSH 0.75 03/09/2018   PSA 1.47 03/09/2018   HGBA1C 5.6 03/09/2018        Assessment & Plan:

## 2018-10-19 NOTE — Assessment & Plan Note (Signed)
stable overall by history and exam, recent data reviewed with pt, and pt to continue medical treatment as before,  to f/u any worsening symptoms or concerns  

## 2018-10-19 NOTE — Assessment & Plan Note (Signed)
Likely refraction error vs other such as cataract, for optho referral

## 2018-10-19 NOTE — Patient Instructions (Addendum)
Please continue all other medications as before, and refills have been done if requested.  Please have the pharmacy call with any other refills you may need.  Please continue your efforts at being more active, low cholesterol diet, and weight control.  You are otherwise up to date with prevention measures today.  Please keep your appointments with your specialists as you may have planned  You will be contacted regarding the referral for: Eye doctor  Please go to the LAB in the Basement (turn left off the elevator) for the tests to be done today  You will be contacted by phone if any changes need to be made immediately.  Otherwise, you will receive a letter about your results with an explanation, but please check with MyChart first.  Please remember to sign up for MyChart if you have not done so, as this will be important to you in the future with finding out test results, communicating by private email, and scheduling acute appointments online when needed.  Please return in 6 months, or sooner if needed

## 2018-10-21 ENCOUNTER — Other Ambulatory Visit: Payer: Self-pay | Admitting: Orthopedic Surgery

## 2018-11-01 DIAGNOSIS — G4733 Obstructive sleep apnea (adult) (pediatric): Secondary | ICD-10-CM | POA: Diagnosis not present

## 2018-11-07 ENCOUNTER — Encounter (HOSPITAL_BASED_OUTPATIENT_CLINIC_OR_DEPARTMENT_OTHER): Payer: Self-pay | Admitting: *Deleted

## 2018-11-07 ENCOUNTER — Other Ambulatory Visit: Payer: Self-pay

## 2018-11-21 ENCOUNTER — Encounter (HOSPITAL_BASED_OUTPATIENT_CLINIC_OR_DEPARTMENT_OTHER): Payer: Self-pay | Admitting: Anesthesiology

## 2018-11-21 ENCOUNTER — Encounter (HOSPITAL_BASED_OUTPATIENT_CLINIC_OR_DEPARTMENT_OTHER)
Admission: RE | Disposition: A | Discharge status: Home / Self Care | Payer: Self-pay | Source: Home / Self Care | Attending: Orthopedic Surgery

## 2018-11-21 ENCOUNTER — Ambulatory Visit (HOSPITAL_BASED_OUTPATIENT_CLINIC_OR_DEPARTMENT_OTHER)
Admission: RE | Admit: 2018-11-21 | Discharge: 2018-11-21 | Disposition: A | Discharge status: Home / Self Care | Payer: PPO | Attending: Orthopedic Surgery | Admitting: Orthopedic Surgery

## 2018-11-21 ENCOUNTER — Ambulatory Visit (HOSPITAL_BASED_OUTPATIENT_CLINIC_OR_DEPARTMENT_OTHER): Payer: PPO | Admitting: Anesthesiology

## 2018-11-21 DIAGNOSIS — G4733 Obstructive sleep apnea (adult) (pediatric): Secondary | ICD-10-CM | POA: Insufficient documentation

## 2018-11-21 DIAGNOSIS — Z79899 Other long term (current) drug therapy: Secondary | ICD-10-CM | POA: Diagnosis not present

## 2018-11-21 DIAGNOSIS — Z7982 Long term (current) use of aspirin: Secondary | ICD-10-CM | POA: Diagnosis not present

## 2018-11-21 DIAGNOSIS — J449 Chronic obstructive pulmonary disease, unspecified: Secondary | ICD-10-CM | POA: Insufficient documentation

## 2018-11-21 DIAGNOSIS — I10 Essential (primary) hypertension: Secondary | ICD-10-CM | POA: Diagnosis not present

## 2018-11-21 DIAGNOSIS — Z87891 Personal history of nicotine dependence: Secondary | ICD-10-CM | POA: Diagnosis not present

## 2018-11-21 DIAGNOSIS — E785 Hyperlipidemia, unspecified: Secondary | ICD-10-CM | POA: Insufficient documentation

## 2018-11-21 DIAGNOSIS — G5602 Carpal tunnel syndrome, left upper limb: Secondary | ICD-10-CM | POA: Diagnosis not present

## 2018-11-21 DIAGNOSIS — K219 Gastro-esophageal reflux disease without esophagitis: Secondary | ICD-10-CM | POA: Diagnosis not present

## 2018-11-21 HISTORY — PX: CARPAL TUNNEL RELEASE: SHX101

## 2018-11-21 SURGERY — CARPAL TUNNEL RELEASE
Anesthesia: Monitor Anesthesia Care | Site: Hand | Laterality: Left

## 2018-11-21 MED ORDER — BUPIVACAINE HCL (PF) 0.25 % IJ SOLN
INTRAMUSCULAR | Status: DC | PRN
  Administered 2018-11-21: 10 mL

## 2018-11-21 MED ORDER — LACTATED RINGERS IV SOLN
INTRAVENOUS | Status: DC
  Administered 2018-11-21: 13:00:00 via INTRAVENOUS

## 2018-11-21 MED ORDER — FENTANYL CITRATE (PF) 100 MCG/2ML IJ SOLN
INTRAMUSCULAR | Status: AC
  Filled 2018-11-21: qty 2

## 2018-11-21 MED ORDER — FENTANYL CITRATE (PF) 100 MCG/2ML IJ SOLN: 25.0000 ug | INTRAMUSCULAR | Status: DC | PRN

## 2018-11-21 MED ORDER — ONDANSETRON HCL 4 MG/2ML IJ SOLN
INTRAMUSCULAR | Status: DC | PRN
  Administered 2018-11-21: 4 mg via INTRAVENOUS

## 2018-11-21 MED ORDER — PROPOFOL 500 MG/50ML IV EMUL
INTRAVENOUS | Status: DC | PRN
  Administered 2018-11-21: 75 ug/kg/min via INTRAVENOUS

## 2018-11-21 MED ORDER — LIDOCAINE HCL (CARDIAC) PF 100 MG/5ML IV SOSY
PREFILLED_SYRINGE | INTRAVENOUS | Status: DC | PRN
  Administered 2018-11-21: 50 mg via INTRAVENOUS

## 2018-11-21 MED ORDER — SCOPOLAMINE 1 MG/3DAYS TD PT72: 1.0000 | MEDICATED_PATCH | Freq: Once | TRANSDERMAL | Status: DC | PRN

## 2018-11-21 MED ORDER — HYDROCODONE-ACETAMINOPHEN 5-325 MG PO TABS: ORAL_TABLET | ORAL | 0 refills | Status: DC

## 2018-11-21 MED ORDER — PROPOFOL 10 MG/ML IV BOLUS
INTRAVENOUS | Status: DC | PRN
  Administered 2018-11-21: 20 mg via INTRAVENOUS

## 2018-11-21 MED ORDER — MIDAZOLAM HCL 2 MG/2ML IJ SOLN: 1.0000 mg | INTRAMUSCULAR | Status: DC | PRN

## 2018-11-21 MED ORDER — ONDANSETRON HCL 4 MG/2ML IJ SOLN: 4.0000 mg | Freq: Once | INTRAMUSCULAR | Status: DC | PRN

## 2018-11-21 MED ORDER — LIDOCAINE 2% (20 MG/ML) 5 ML SYRINGE
INTRAMUSCULAR | Status: AC
  Filled 2018-11-21: qty 5

## 2018-11-21 MED ORDER — FENTANYL CITRATE (PF) 100 MCG/2ML IJ SOLN
50.0000 ug | INTRAMUSCULAR | Status: DC | PRN
  Administered 2018-11-21 (×2): 50 ug via INTRAVENOUS

## 2018-11-21 MED ORDER — CHLORHEXIDINE GLUCONATE 4 % EX LIQD: 60.0000 mL | Freq: Once | CUTANEOUS | Status: DC

## 2018-11-21 MED ORDER — CEFAZOLIN SODIUM-DEXTROSE 2-4 GM/100ML-% IV SOLN
2.0000 g | INTRAVENOUS | Status: AC
  Administered 2018-11-21: 2 g via INTRAVENOUS

## 2018-11-21 SURGICAL SUPPLY — 39 items
BANDAGE ACE 3X5.8 VEL STRL LF (GAUZE/BANDAGES/DRESSINGS) ×3 IMPLANT
BLADE SURG 15 STRL LF DISP TIS (BLADE) ×2 IMPLANT
BLADE SURG 15 STRL SS (BLADE) ×4
BNDG ESMARK 4X9 LF (GAUZE/BANDAGES/DRESSINGS) IMPLANT
BNDG GAUZE ELAST 4 BULKY (GAUZE/BANDAGES/DRESSINGS) ×3 IMPLANT
CHLORAPREP W/TINT 26ML (MISCELLANEOUS) ×3 IMPLANT
CORD BIPOLAR FORCEPS 12FT (ELECTRODE) ×3 IMPLANT
COVER BACK TABLE 60X90IN (DRAPES) ×3 IMPLANT
COVER MAYO STAND STRL (DRAPES) ×3 IMPLANT
COVER WAND RF STERILE (DRAPES) IMPLANT
CUFF TOURNIQUET SINGLE 18IN (TOURNIQUET CUFF) ×3 IMPLANT
DRAPE EXTREMITY T 121X128X90 (DISPOSABLE) ×3 IMPLANT
DRAPE SURG 17X23 STRL (DRAPES) ×3 IMPLANT
DRSG PAD ABDOMINAL 8X10 ST (GAUZE/BANDAGES/DRESSINGS) ×3 IMPLANT
GAUZE SPONGE 4X4 12PLY STRL (GAUZE/BANDAGES/DRESSINGS) ×3 IMPLANT
GAUZE XEROFORM 1X8 LF (GAUZE/BANDAGES/DRESSINGS) ×3 IMPLANT
GLOVE BIO SURGEON STRL SZ 6.5 (GLOVE) ×1 IMPLANT
GLOVE BIO SURGEON STRL SZ7.5 (GLOVE) ×3 IMPLANT
GLOVE BIO SURGEONS STRL SZ 6.5 (GLOVE) ×1
GLOVE BIOGEL PI IND STRL 7.0 (GLOVE) IMPLANT
GLOVE BIOGEL PI IND STRL 8 (GLOVE) ×1 IMPLANT
GLOVE BIOGEL PI INDICATOR 7.0 (GLOVE) ×2
GLOVE BIOGEL PI INDICATOR 8 (GLOVE) ×2
GLOVE EXAM NITRILE MD LF STRL (GLOVE) ×2 IMPLANT
GOWN STRL REUS W/ TWL LRG LVL3 (GOWN DISPOSABLE) ×1 IMPLANT
GOWN STRL REUS W/TWL LRG LVL3 (GOWN DISPOSABLE) ×2
GOWN STRL REUS W/TWL XL LVL3 (GOWN DISPOSABLE) ×3 IMPLANT
NDL HYPO 25X1 1.5 SAFETY (NEEDLE) ×1 IMPLANT
NEEDLE HYPO 25X1 1.5 SAFETY (NEEDLE) ×3 IMPLANT
NS IRRIG 1000ML POUR BTL (IV SOLUTION) ×3 IMPLANT
PACK BASIN DAY SURGERY FS (CUSTOM PROCEDURE TRAY) ×3 IMPLANT
PADDING CAST ABS 4INX4YD NS (CAST SUPPLIES)
PADDING CAST ABS COTTON 4X4 ST (CAST SUPPLIES) ×1 IMPLANT
STOCKINETTE 4X48 STRL (DRAPES) ×3 IMPLANT
SUT ETHILON 4 0 PS 2 18 (SUTURE) ×3 IMPLANT
SYR BULB 3OZ (MISCELLANEOUS) ×3 IMPLANT
SYR CONTROL 10ML LL (SYRINGE) ×3 IMPLANT
TOWEL GREEN STERILE FF (TOWEL DISPOSABLE) ×6 IMPLANT
UNDERPAD 30X30 (UNDERPADS AND DIAPERS) ×1 IMPLANT

## 2018-11-21 NOTE — Transfer of Care (Signed)
Immediate Anesthesia Transfer of Care Note  Patient: DYLLIN GULLEY  Procedure(s) Performed: LEFT CARPAL TUNNEL RELEASE (Left Hand)  Patient Location: PACU  Anesthesia Type:MAC and Bier block  Level of Consciousness: awake and alert   Airway & Oxygen Therapy: Patient Spontanous Breathing and Patient connected to face mask oxygen  Post-op Assessment: Report given to RN and Post -op Vital signs reviewed and stable  Post vital signs: Reviewed and stable  Last Vitals:  Vitals Value Taken Time  BP 134/95 11/21/2018  2:04 PM  Temp    Pulse    Resp 21 11/21/2018  2:04 PM  SpO2    Vitals shown include unvalidated device data.  Last Pain:  Vitals:   11/21/18 1223  TempSrc: Oral  PainSc: 0-No pain         Complications: No apparent anesthesia complications

## 2018-11-21 NOTE — Anesthesia Procedure Notes (Signed)
Anesthesia Regional Block: Bier block (IV Regional)   Pre-Anesthetic Checklist: ,, timeout performed, Correct Patient, Correct Site, Correct Laterality, Correct Procedure,, site marked, surgical consent,, at surgeon's request  Laterality: Left     Needles:  Injection technique: Single-shot  Needle Type: Other      Needle Gauge: 20     Additional Needles:   Procedures:,,,,, intact distal pulses, Esmarch exsanguination, single tourniquet utilized,  Narrative:  Start time: 11/21/2018 1:32 PM End time: 11/21/2018 1:33 PM Injection made incrementally with aspirations every 33 mL.  Performed by: Personally   Additional Notes: esmark wrap, torq inflated 250, neg pulse, slowly injected 0.5% pres free lidocaine, pt tol well, no neg s or s

## 2018-11-21 NOTE — Discharge Instructions (Addendum)
[facility] Center Instructions °Hand Surgery ° °Wound Care: °Keep your hand elevated above the level of your heart.  Do not allow it to dangle by your side.  Keep the dressing dry and do not remove it unless your doctor advises you to do so.  He will usually change it at the time of your post-op visit.  Moving your fingers is advised to stimulate circulation but will depend on the site of your surgery.  If you have a splint applied, your doctor will advise you regarding movement. ° °Activity: °Do not drive or operate machinery today.  Rest today and then you may return to your normal activity and work as indicated by your physician. ° °Diet:  °Drink liquids today or eat a light diet.  You may resume a regular diet tomorrow.   ° °General expectations: °Pain for two to three days. °Fingers may become slightly swollen. ° °Call your doctor if any of the following occur: °Severe pain not relieved by pain medication. °Elevated temperature. °Dressing soaked with blood. °Inability to move fingers. °Jeane or bluish color to fingers. ° ° °Post Anesthesia Home Care Instructions ° °Activity: °Get plenty of rest for the remainder of the day. A responsible individual must stay with you for 24 hours following the procedure.  °For the next 24 hours, DO NOT: °-Drive a car °-Operate machinery °-Drink alcoholic beverages °-Take any medication unless instructed by your physician °-Make any legal decisions or sign important papers. ° °Meals: °Start with liquid foods such as gelatin or soup. Progress to regular foods as tolerated. Avoid greasy, spicy, heavy foods. If nausea and/or vomiting occur, drink only clear liquids until the nausea and/or vomiting subsides. Call your physician if vomiting continues. ° °Special Instructions/Symptoms: °Your throat may feel dry or sore from the anesthesia or the breathing tube placed in your throat during surgery. If this causes discomfort, gargle with warm salt water. The discomfort should disappear within  24 hours. ° °If you had a scopolamine patch placed behind your ear for the management of post- operative nausea and/or vomiting: ° °1. The medication in the patch is effective for 72 hours, after which it should be removed.  Wrap patch in a tissue and discard in the trash. Wash hands thoroughly with soap and water. °2. You may remove the patch earlier than 72 hours if you experience unpleasant side effects which may include dry mouth, dizziness or visual disturbances. °3. Avoid touching the patch. Wash your hands with soap and water after contact with the patch. °  ° ° °

## 2018-11-21 NOTE — H&P (Signed)
Ryan Diaz is an 73 y.o. male.   Chief Complaint: left carpal tunnel syndrome HPI: 73 yo male with numbness and tingling left hand.  Positive nerve conduction studies.  Has been happy with results of right carpal tunnel release.  Ryan Diaz wishes to have a left carpal tunnel release  Allergies: No Known Allergies  Past Medical History:  Diagnosis Date  . 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 ALLERGIC RHINITIS 08/08/2007  . ANXIETY 08/08/2007  . Carpal tunnel syndrome   . COPD 10/03/2009  . DEPRESSION 08/08/2007  . ERECTILE DYSFUNCTION 08/08/2007  . GENITAL HERPES, HX OF 08/08/2007  . GERD 08/08/2007  . GLUCOSE INTOLERANCE 08/08/2007  . HYPERLIPIDEMIA 08/08/2007  . HYPERTENSION 08/08/2007  . Impaired glucose tolerance 09/26/2011  . Insomnia 05/18/2012  . PAIN IN SOFT TISSUES OF LIMB 10/02/2009  . Sleep apnea    OSA-uses CPAP nightly    Past Surgical History:  Procedure Laterality Date  . ACHILLES TENDON REPAIR Bilateral   . CARPAL TUNNEL RELEASE Right 08/18/2018   Procedure: RIGHT CARPAL TUNNEL RELEASE;  Surgeon: Betha LoaKuzma, Ajani Schnieders, MD;  Location: [facility];  Service: Orthopedics;  Laterality: Right;  . COLONOSCOPY      Family History: Family History  Problem Relation Age of Onset  . Colon polyps Brother     Social History:   reports that Ryan Diaz quit smoking about 9 years ago. His smoking use included cigarettes. Ryan Diaz has a 52.50 pack-year smoking history. Ryan Diaz has never used smokeless tobacco. Ryan Diaz reports current alcohol use of about 46.0 - 53.0 standard drinks of alcohol per week. Ryan Diaz reports current drug use. Drug: Marijuana.  Medications: Medications Prior to Admission  Medication Sig Dispense Refill  . albuterol (PROVENTIL HFA;VENTOLIN HFA) 108 (90 BASE) MCG/ACT inhaler Inhale 2 puffs into the lungs every 6 (six) hours as needed for wheezing or shortness of breath.    Marland Kitchen. aspirin EC 81 MG tablet Take 81 mg by mouth daily.    Marland Kitchen. atorvastatin (LIPITOR) 80 MG tablet Take 1 tablet  (80 mg total) by mouth daily. 90 tablet 3  . lisinopril (PRINIVIL,ZESTRIL) 10 MG tablet Take 1 tablet (10 mg total) by mouth daily. 90 tablet 3  . Omega-3 Fatty Acids (FISH OIL) 1000 MG CAPS Take 1,000 mg by mouth 2 (two) times daily.    Marland Kitchen. omeprazole (PRILOSEC) 20 MG capsule Take 1 capsule (20 mg total) by mouth 2 (two) times daily. 90 capsule 3  . terazosin (HYTRIN) 2 MG capsule Take 1 capsule (2 mg total) by mouth at bedtime. 90 capsule 3  . tiotropium (SPIRIVA) 18 MCG inhalation capsule Place 18 mcg into inhaler and inhale daily.      No results found for this or any previous visit (from the past 48 hour(s)).  No results found.   A comprehensive review of systems was negative.  Blood pressure 132/82, pulse 79, temperature 98.1 F (36.7 C), temperature source Oral, resp. rate 16, height 5\' 11"  (1.803 m), weight 76.3 kg, SpO2 96 %.  General appearance: alert, cooperative and appears stated age Head: Normocephalic, without obvious abnormality, atraumatic Neck: supple, symmetrical, trachea midline Cardio: regular rate and rhythm Resp: clear to auscultation bilaterally Extremities: Intact sensation and capillary refill all digits.  +epl/fpl/io.  No wounds.  Pulses: 2+ and symmetric Skin: Skin color, texture, turgor normal. No rashes or lesions Neurologic: Grossly normal Incision/Wound: none  Assessment/Plan Left carpal tunnel syndrome.  Non operative and operative treatment options have been discussed with the patient and patient wishes  to proceed with operative treatment. Risks, benefits, and alternatives of surgery have been discussed and the patient agrees with the plan of care.   Betha Loa 11/21/2018, 12:32 PM

## 2018-11-21 NOTE — Op Note (Signed)
11/21/2018 [facility]                              OPERATIVE REPORT   PREOPERATIVE DIAGNOSIS:  Left carpal tunnel syndrome.  POSTOPERATIVE DIAGNOSIS:  Left carpal tunnel syndrome.  PROCEDURE:  Left carpal tunnel release.  SURGEON:  Betha Loa, MD  ASSISTANT:  none.  ANESTHESIA: Bier block with sedation  IV FLUIDS:  Per anesthesia flow sheet.  ESTIMATED BLOOD LOSS:  Minimal.  COMPLICATIONS:  None.  SPECIMENS:  None.  TOURNIQUET TIME:    Total Tourniquet Time Documented: Forearm (Left) - 26 minutes Total: Forearm (Left) - 26 minutes   DISPOSITION:  Stable to PACU.  LOCATION: [facility]  INDICATIONS:  73 yo male with numbness left hand.  Positive nerve conduction studies.  He wishes to have a carpal tunnel release for management of his symptoms.  Risks, benefits and alternatives of surgery were discussed including the risk of blood loss; infection; damage to nerves, vessels, tendons, ligaments, bone; failure of surgery; need for additional surgery; complications with wound healing; continued pain; recurrence of carpal tunnel syndrome; and damage to motor branch. He voiced understanding of these risks and elected to proceed.   OPERATIVE COURSE:  After being identified preoperatively by myself, the patient and I agreed upon the procedure and site of procedure.  The surgical site was marked.  The risks, benefits, and alternatives of the surgery were reviewed and he wished to proceed.  Surgical consent had been signed.  He was given IV Ancef as preoperative antibiotic prophylaxis.  He was transferred to the operating room and placed on the operating room table in supine position with the Left upper extremity on an armboard.  Bier block anesthesia was induced by the anesthesiologist.  Left upper extremity was prepped and draped in normal sterile orthopaedic fashion.  A surgical pause was performed between the surgeons, anesthesia, and operating room staff,  and all were in agreement as to the patient, procedure, and site of procedure.  Tourniquet at the proximal aspect of the forearm had been inflated for the Bier block  Incision was made over the transverse carpal ligament and carried into the subcutaneous tissues by spreading technique.  Bipolar electrocautery was used to obtain hemostasis.  The palmar fascia was sharply incised.  The transverse carpal ligament was identified and sharply incised.  It was incised distally first.  Care was taken to ensure complete decompression distally.  It was then incised proximally.  Scissors were used to split the distal aspect of the volar antebrachial fascia.  A finger was placed into the wound to ensure complete decompression, which was the case.  The nerve was examined.  It was flattened and hyperemic. and It was adherent to the radial leaflet.  The motor branch was identified and was intact.  The wound was copiously irrigated with sterile saline.  It was then closed with 4-0 nylon in a horizontal mattress fashion.  It was injected with 0.25% plain Marcaine to aid in postoperative analgesia.  It was dressed with sterile Xeroform, 4x4s, an ABD, and wrapped with Kerlix and an Ace bandage.  Tourniquet was deflated at 26 minutes.  Fingertips were pink with brisk capillary refill after deflation of the tourniquet.  Operative drapes were broken down.  The patient was awoken from anesthesia safely.  He was transferred back to stretcher and taken to the PACU in stable condition.  I will see him  back in the office in 1 week for postoperative followup.  I will give him a prescription for Norco 5/325 1-2 tabs PO q6 hours prn pain, dispense # 20.    Betha Loa, MD Electronically signed, 11/21/18

## 2018-11-21 NOTE — Anesthesia Preprocedure Evaluation (Signed)
Anesthesia Evaluation  Patient identified by MRN, date of birth, ID band Patient awake    Reviewed: Allergy & Precautions, NPO status , Patient's Chart, lab work & pertinent test results  Airway Mallampati: II  TM Distance: >3 FB Neck ROM: Full    Dental  (+) Dental Advisory Given, Partial Upper, Partial Lower   Pulmonary sleep apnea and Continuous Positive Airway Pressure Ventilation , COPD,  COPD inhaler, former smoker,    Pulmonary exam normal breath sounds clear to auscultation       Cardiovascular hypertension, Pt. on medications Normal cardiovascular exam Rhythm:Regular Rate:Normal     Neuro/Psych PSYCHIATRIC DISORDERS Anxiety Depression negative neurological ROS     GI/Hepatic Neg liver ROS, GERD  Medicated,  Endo/Other  negative endocrine ROS  Renal/GU Renal InsufficiencyRenal disease     Musculoskeletal   Abdominal   Peds  Hematology negative hematology ROS (+)   Anesthesia Other Findings   Reproductive/Obstetrics                             Anesthesia Physical Anesthesia Plan  ASA: II  Anesthesia Plan: MAC and Bier Block and Bier Block-LIDOCAINE ONLY   Post-op Pain Management:    Induction: Intravenous  PONV Risk Score and Plan: 1 and Propofol infusion and Treatment may vary due to age or medical condition  Airway Management Planned: Nasal Cannula and Natural Airway  Additional Equipment:   Intra-op Plan:   Post-operative Plan:   Informed Consent: I have reviewed the patients History and Physical, chart, labs and discussed the procedure including the risks, benefits and alternatives for the proposed anesthesia with the patient or authorized representative who has indicated his/her understanding and acceptance.   Dental advisory given  Plan Discussed with: CRNA and Anesthesiologist  Anesthesia Plan Comments:         Anesthesia Quick Evaluation

## 2018-11-22 NOTE — Anesthesia Postprocedure Evaluation (Signed)
Anesthesia Post Note  Patient: Ryan Diaz  Procedure(s) Performed: LEFT CARPAL TUNNEL RELEASE (Left Hand)     Patient location during evaluation: PACU Anesthesia Type: MAC and Bier Block Level of consciousness: awake and alert Pain management: pain level controlled Vital Signs Assessment: post-procedure vital signs reviewed and stable Respiratory status: spontaneous breathing, nonlabored ventilation, respiratory function stable and patient connected to nasal cannula oxygen Cardiovascular status: stable and blood pressure returned to baseline Postop Assessment: no apparent nausea or vomiting Anesthetic complications: no    Last Vitals:  Vitals:   11/21/18 1445 11/21/18 1500  BP: (!) 145/86 (!) 151/78  Pulse: 73 79  Resp: 14 16  Temp:  37.1 C  SpO2: 96% 96%    Last Pain:  Vitals:   11/21/18 1500  TempSrc:   PainSc: 0-No pain                 Catalina Gravel

## 2018-11-23 ENCOUNTER — Encounter (HOSPITAL_BASED_OUTPATIENT_CLINIC_OR_DEPARTMENT_OTHER): Payer: Self-pay | Admitting: Orthopedic Surgery

## 2018-11-24 DIAGNOSIS — R7303 Prediabetes: Secondary | ICD-10-CM | POA: Diagnosis not present

## 2018-11-24 DIAGNOSIS — H02834 Dermatochalasis of left upper eyelid: Secondary | ICD-10-CM | POA: Diagnosis not present

## 2018-11-24 DIAGNOSIS — H2513 Age-related nuclear cataract, bilateral: Secondary | ICD-10-CM | POA: Diagnosis not present

## 2018-11-24 DIAGNOSIS — H02831 Dermatochalasis of right upper eyelid: Secondary | ICD-10-CM | POA: Diagnosis not present

## 2018-11-24 LAB — HM DIABETES EYE EXAM

## 2018-12-08 ENCOUNTER — Encounter: Payer: Self-pay | Admitting: Internal Medicine

## 2018-12-08 ENCOUNTER — Ambulatory Visit (INDEPENDENT_AMBULATORY_CARE_PROVIDER_SITE_OTHER): Payer: PPO | Admitting: Internal Medicine

## 2018-12-08 VITALS — BP 106/64 | HR 103 | Temp 98.4°F | Ht 71.0 in | Wt 171.0 lb

## 2018-12-08 DIAGNOSIS — R7302 Impaired glucose tolerance (oral): Secondary | ICD-10-CM

## 2018-12-08 DIAGNOSIS — M79671 Pain in right foot: Secondary | ICD-10-CM | POA: Diagnosis not present

## 2018-12-08 DIAGNOSIS — I10 Essential (primary) hypertension: Secondary | ICD-10-CM | POA: Diagnosis not present

## 2018-12-08 DIAGNOSIS — J449 Chronic obstructive pulmonary disease, unspecified: Secondary | ICD-10-CM | POA: Diagnosis not present

## 2018-12-08 MED ORDER — GABAPENTIN 100 MG PO CAPS: 100.0000 mg | ORAL_CAPSULE | Freq: Three times a day (TID) | ORAL | 5 refills | Status: AC

## 2018-12-08 MED ORDER — DICLOFENAC SODIUM 1 % TD GEL: 2.0000 g | Freq: Four times a day (QID) | TRANSDERMAL | 5 refills | Status: DC | PRN

## 2018-12-08 MED ORDER — LISINOPRIL 20 MG PO TABS: 20.0000 mg | ORAL_TABLET | Freq: Every day | ORAL | 3 refills | Status: DC

## 2018-12-08 NOTE — Assessment & Plan Note (Signed)
stable overall by history and exam, recent data reviewed with pt, and pt to continue medical treatment as before,  to f/u any worsening symptoms or concerns  

## 2018-12-08 NOTE — Patient Instructions (Addendum)
Please take all new medication as prescribed - the gabapentin for nerve pain, and voltaren gel topical for pain  You are given the written rx for Lisinopril 20 mg for the local pharmacy  Please continue all other medications as before, and refills have been done if requested.  Please have the pharmacy call with any other refills you may need.  Please continue your efforts at being more active, low cholesterol diet, and weight control.  Please keep your appointments with your specialists as you may have planned  Please return in 6 months, or sooner if needed

## 2018-12-08 NOTE — Assessment & Plan Note (Signed)
Suspect mortons neuroma vs other neuritic pain, for gabapentin 100 tid, volt gel prn, and would like to refer to podiatry but he declines for now

## 2018-12-08 NOTE — Progress Notes (Signed)
Subjective:    Patient ID: Ryan Diaz, male    DOB: 05-10-1946, 73 y.o.   MRN: [id number]  HPI  Here with c/o right foot distal pain x 2 wks, intermittent, mild to mod, localized betwee right great and second toes, without swelling, trauma or skin change, but burning pain much worse off and on with walking.  Pt denies chest pain, increased sob or doe, wheezing, orthopnea, PND, increased LE swelling, palpitations, dizziness or syncope.  Pt denies new neurological symptoms such as new headache, or facial or extremity weakness or numbness   Pt denies polydipsia, polyuria Past Medical History:  Diagnosis Date  . 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 ALLERGIC RHINITIS 08/08/2007  . ANXIETY 08/08/2007  . Carpal tunnel syndrome   . COPD 10/03/2009  . DEPRESSION 08/08/2007  . ERECTILE DYSFUNCTION 08/08/2007  . GENITAL HERPES, HX OF 08/08/2007  . GERD 08/08/2007  . GLUCOSE INTOLERANCE 08/08/2007  . HYPERLIPIDEMIA 08/08/2007  . HYPERTENSION 08/08/2007  . Impaired glucose tolerance 09/26/2011  . Insomnia 05/18/2012  . PAIN IN SOFT TISSUES OF LIMB 10/02/2009  . Sleep apnea    OSA-uses CPAP nightly   Past Surgical History:  Procedure Laterality Date  . ACHILLES TENDON REPAIR Bilateral   . CARPAL TUNNEL RELEASE Right 08/18/2018   Procedure: RIGHT CARPAL TUNNEL RELEASE;  Surgeon: Betha Loa, MD;  Location: [facility];  Service: Orthopedics;  Laterality: Right;  . CARPAL TUNNEL RELEASE Left 11/21/2018   Procedure: LEFT CARPAL TUNNEL RELEASE;  Surgeon: Betha Loa, MD;  Location: [facility];  Service: Orthopedics;  Laterality: Left;  Bier block  . COLONOSCOPY      reports that he quit smoking about 9 years ago. His smoking use included cigarettes. He has a 52.50 pack-year smoking history. He has never used smokeless tobacco. He reports current alcohol use of about 46.0 - 53.0 standard drinks of alcohol per week. He reports current drug use. Drug: Marijuana. family history  includes Colon polyps in his brother. No Known Allergies Current Outpatient Medications on File Prior to Visit  Medication Sig Dispense Refill  . albuterol (PROVENTIL HFA;VENTOLIN HFA) 108 (90 BASE) MCG/ACT inhaler Inhale 2 puffs into the lungs every 6 (six) hours as needed for wheezing or shortness of breath.    Marland Kitchen aspirin EC 81 MG tablet Take 81 mg by mouth daily.    Marland Kitchen atorvastatin (LIPITOR) 80 MG tablet Take 1 tablet (80 mg total) by mouth daily. 90 tablet 3  . HYDROcodone-acetaminophen (NORCO) 5-325 MG tablet 1-2 tabs po q6 hours prn pain 20 tablet 0  . Omega-3 Fatty Acids (FISH OIL) 1000 MG CAPS Take 1,000 mg by mouth 2 (two) times daily.    Marland Kitchen omeprazole (PRILOSEC) 20 MG capsule Take 1 capsule (20 mg total) by mouth 2 (two) times daily. 90 capsule 3  . terazosin (HYTRIN) 2 MG capsule Take 1 capsule (2 mg total) by mouth at bedtime. 90 capsule 3  . tiotropium (SPIRIVA) 18 MCG inhalation capsule Place 18 mcg into inhaler and inhale daily.     No current facility-administered medications on file prior to visit.    Review of Systems  Constitutional: Negative for other unusual diaphoresis or sweats HENT: Negative for ear discharge or swelling Eyes: Negative for other worsening visual disturbances Respiratory: Negative for stridor or other swelling  Gastrointestinal: Negative for worsening distension or other blood Genitourinary: Negative for retention or other urinary change Musculoskeletal: Negative for other MSK pain or swelling Skin: Negative for color  change or other new lesions Neurological: Negative for worsening tremors and other numbness  Psychiatric/Behavioral: Negative for worsening agitation or other fatigue All other system neg per pt    Objective:   Physical Exam BP 106/64   Pulse (!) 103   Temp 98.4 F (36.9 C) (Oral)   Ht 5\' 11"  (1.803 m)   Wt 171 lb (77.6 kg)   SpO2 96%   BMI 23.85 kg/m  VS noted,  Constitutional: Pt appears in NAD HENT: Head: NCAT.  Right  Ear: External ear normal.  Left Ear: External ear normal.  Eyes: . Pupils are equal, round, and reactive to light. Conjunctivae and EOM are normal Nose: without d/c or deformity Neck: Neck supple. Gross normal ROM Cardiovascular: Normal rate and regular rhythm.   Pulmonary/Chest: Effort normal and breath sounds without rales or wheezing.  Abd:  Soft, NT, ND, + BS, no organomegaly Right foot without skin change, swelling or tender between right foot great and second toes Neurological: Pt is alert. At baseline orientation, motor grossly intact Skin: Skin is warm. No rashes, other new lesions, no LE edema Psychiatric: Pt behavior is normal without agitation  No other exam findings Lab Results  Component Value Date   WBC 6.7 03/09/2018   HGB 15.0 03/09/2018   HCT 43.0 03/09/2018   PLT 300.0 03/09/2018   GLUCOSE 96 10/19/2018   CHOL 143 10/19/2018   TRIG 222.0 (H) 10/19/2018   HDL 39.10 10/19/2018   LDLDIRECT 83.0 10/19/2018   LDLCALC 54 03/09/2018   ALT 34 10/19/2018   AST 26 10/19/2018   NA 138 10/19/2018   K 4.1 10/19/2018   CL 104 10/19/2018   CREATININE 1.09 10/19/2018   BUN 18 10/19/2018   CO2 25 10/19/2018   TSH 0.75 03/09/2018   PSA 1.47 03/09/2018   HGBA1C 5.8 10/19/2018       Assessment & Plan:

## 2019-01-23 ENCOUNTER — Encounter: Payer: Self-pay | Admitting: Internal Medicine

## 2019-01-23 ENCOUNTER — Ambulatory Visit (INDEPENDENT_AMBULATORY_CARE_PROVIDER_SITE_OTHER): Payer: PPO | Admitting: Internal Medicine

## 2019-01-23 ENCOUNTER — Ambulatory Visit (INDEPENDENT_AMBULATORY_CARE_PROVIDER_SITE_OTHER)
Admission: RE | Admit: 2019-01-23 | Discharge: 2019-01-23 | Disposition: A | Discharge status: Home / Self Care | Payer: PPO | Source: Ambulatory Visit | Attending: Internal Medicine | Admitting: Internal Medicine

## 2019-01-23 VITALS — BP 140/80 | HR 74 | Ht 71.0 in | Wt 169.6 lb

## 2019-01-23 DIAGNOSIS — G4733 Obstructive sleep apnea (adult) (pediatric): Secondary | ICD-10-CM

## 2019-01-23 DIAGNOSIS — J449 Chronic obstructive pulmonary disease, unspecified: Secondary | ICD-10-CM

## 2019-01-23 NOTE — Assessment & Plan Note (Signed)
He benefits from CPAP with improved sleep.  Download confirms good control.  I have emphasized compliance goals. Plan-Continue CPAP auto 5-20

## 2019-01-23 NOTE — Assessment & Plan Note (Addendum)
Previous longtime smoker.  Pattern is consistent with mixed COPD, emphysema predominant. Plan-CXR.  Consider trial of Trelegy

## 2019-01-23 NOTE — Patient Instructions (Signed)
We can continue CPAP auto 5-20, mask of choice, humidifier, supplies, Airview/ card  Please try to use your CPAP all night, every night  Order- CXR    Dx COPD mixed type  Order- Office Spirometry     Dx COPD mixed type  Please call if we can help

## 2019-01-23 NOTE — Progress Notes (Signed)
HPI male former smoker followed for OSA, insomnia, complicated by ETOH, COPD, GERD, HBP NPSG 10/31/13- AHI 52/hour, desaturation to 83%, body weight 165 pounds Unattended Home Sleep Test 09/02/2016- AHI 9.4/hour, desaturation to 87%, body weight 163 pounds Office Spirometry 01/2019-moderate obstructive airways disease.  FVC 3.6/92%, FEV1 2.0/67%, ratio 1.54, FEF 25-75% 1.9/45% ------------------------------------------------------------------------------------- 01/20/18- 73 year old male former smoker followed for OSA, insomnia, complicated by history EtOH, COPD, GERD, HBP CPAP auto 5-20/[facility] ----Patient is doing well overall with cpap machine. Spiriva HandiHaler, Proventil HFA, Download 83% compliance, AHI 4.6/hour. Comfortable with his CPAP.  Machine is about a year old.  He sleeps better with it. Breathing is stable with rare need for rescue inhaler as long as he continues daily Spiriva.  Did have flu vaccine.  01/22/2019- 73 year old male former smoker followed for OSA, insomnia, complicated by history EtOH, COPD, GERD, HBP CPAP auto 5-20/[facility] Download compliance 67% AHI 4.1/hour Body weight 169 lbs Albuterol hfa, Spiriva -----COPD,OSA wearing CPAP.No complaints. Sob same with exertion. We discussed compliance goals.  He still skips nights but control is greatly worn. Describes stable dyspnea on exertion with no acute events.  Gets meds from the [place].  No recent CXR. Office Spirometry 01/2019-moderate obstructive airways disease.  FVC 3.6/92%, FEV1 2.0/67%, ratio 1.54, FEF 25-75% 1.9/45%  ROS-see HPI   + = positive Constitutional:    weight loss, night sweats, fevers, chills, +fatigue, lassitude. HEENT:    headaches, difficulty swallowing, tooth/dental problems, sore throat,       sneezing, itching, ear ache, nasal congestion, post nasal drip, snoring CV:    chest pain, orthopnea, PND, swelling in lower extremities, anasarca,                                 dizziness,  palpitations Resp:   shortness of breath with exertion or at rest.                productive cough,   non-productive cough, coughing up of blood.              change in color of mucus.  wheezing.   Skin:    rash or lesions. GI:  No-   heartburn, indigestion, abdominal pain, nausea, vomiting, diarrhea,                 change in bowel habits, loss of appetite GU: dysuria, change in color of urine, no urgency or frequency.   flank pain. MS:   joint pain, stiffness, decreased range of motion, back pain. Neuro-     nothing unusual Psych:  change in mood or affect.  depression or anxiety.   memory loss.  OBJ- Physical Exam                   General- Alert, Oriented, Affect-appropriate, Distress- none acute, + not obese Skin- rash-none, lesions- none, excoriation- none Lymphadenopathy- none Head- atraumatic            Eyes- Gross vision intact, PERRLA, conjunctivae and secretions clear            Ears- Hearing, canals-normal            Nose- Clear, no-Septal dev, mucus, polyps, erosion, perforation             Throat- Mallampati III-IV , mucosa clear , drainage- none, tonsils- atrophic Neck- flexible , trachea midline, no stridor , thyroid nl, carotid no bruit Chest - symmetrical excursion , unlabored  Heart/CV- RRR , no murmur , no gallop  , no rub, nl s1 s2                           - JVD- none , edema- none, stasis changes- none, varices- none           Lung- clear to P&A, wheeze- none, cough- none , dullness-none, rub- none           Chest wall-  Abd-  Br/ Gen/ Rectal- Not done, not indicated Extrem- cyanosis- none, clubbing, none, atrophy- none, strength- nl Neuro- grossly intact to observation

## 2019-01-31 NOTE — Progress Notes (Addendum)
Subjective:   Ryan Diaz is a 73 y.o. male who presents for Medicare Annual/Subsequent preventive examination.  Review of Systems:  No ROS.  Medicare Wellness Visit. Additional risk factors are reflected in the social history.  Cardiac Risk Factors include: advanced age (>80men, >25 women);dyslipidemia;male gender;hypertension Sleep patterns: has interrupted sleep, gets up 1-2 times nightly to void and sleeps 5 hours nightly. Wears C-PAP. Patient reports insomnia issues, discussed recommended sleep tips. Relevant patient education assigned to patient using Emmi.  Home Safety/Smoke Alarms: Feels safe in home. Smoke alarms in place.  Living environment; residence and Firearm Safety: 1-story house/ trailer. Lives alone, no needs for DME, good support system Seat Belt Safety/Bike Helmet: Wears seat belt.   PSA-  Lab Results  Component Value Date   PSA 1.47 03/09/2018   PSA 1.21 01/29/2017   PSA 0.75 02/14/2016       Objective:    Vitals: BP 131/76   Pulse 71   Resp 17   Ht  (1.803 m)   Wt 165 lb (74.8 kg)   SpO2 97%   BMI 23.01 kg/m   Body mass index is 23.01 kg/m.  Advanced Directives 02/01/2019 11/07/2018 08/18/2018 08/15/2018 05/21/2017 12/18/2016 01/01/2015  Does Patient Have a Medical Advance Directive? No No No No No No No  Would patient like information on creating a medical advance directive? Yes (ED - Information included in AVS) No - Patient declined No - Patient declined No - Patient declined Yes (ED - Information included in AVS) Yes (MAU/Ambulatory/Procedural Areas - Information given) No - patient declined information    Tobacco Social History   Tobacco Use  Smoking Status Former Smoker  . Packs/day: 1.50  . Years: 35.00  . Pack years: 52.50  . Types: Cigarettes  . Last attempt to quit: 09/04/2009  . Years since quitting: 9.4  Smokeless Tobacco Never Used     Counseling given: Not Answered  Past Medical History:  Diagnosis Date  . ABUSE,  ALCOHOL, CONTINUOUS 08/08/2007  . ALLERGIC RHINITIS 08/08/2007  . ANXIETY 08/08/2007  . Carpal tunnel syndrome   . COPD 10/03/2009  . DEPRESSION 08/08/2007  . ERECTILE DYSFUNCTION 08/08/2007  . GENITAL HERPES, HX OF 08/08/2007  . GERD 08/08/2007  . GLUCOSE INTOLERANCE 08/08/2007  . HYPERLIPIDEMIA 08/08/2007  . HYPERTENSION 08/08/2007  . Impaired glucose tolerance 09/26/2011  . Insomnia 05/18/2012  . PAIN IN SOFT TISSUES OF LIMB 10/02/2009  . Sleep apnea    OSA-uses CPAP nightly   Past Surgical History:  Procedure Laterality Date  . ACHILLES TENDON REPAIR Bilateral   . CARPAL TUNNEL RELEASE Right 08/18/2018   Procedure: RIGHT CARPAL TUNNEL RELEASE;  Surgeon: Betha Loa, MD;  Location: [facility];  Service: Orthopedics;  Laterality: Right;  . CARPAL TUNNEL RELEASE Left 11/21/2018   Procedure: LEFT CARPAL TUNNEL RELEASE;  Surgeon: Betha Loa, MD;  Location: [facility];  Service: Orthopedics;  Laterality: Left;  Bier block  . COLONOSCOPY     Family History  Problem Relation Age of Onset  . Colon polyps Brother    Social History   Socioeconomic History  . Marital status: Single    Spouse name: Not on file  . Number of children: Not on file  . Years of education: Not on file  . Highest education level: Not on file  Occupational History  . Occupation: self employed Airline pilot work  Engineer, production  . Financial resource strain: Not hard at all  . Food insecurity:  Worry: Never true    Inability: Never true  . Transportation needs:    Medical: No    Non-medical: No  Tobacco Use  . Smoking status: Former Smoker    Packs/day: 1.50    Years: 35.00    Pack years: 52.50    Types: Cigarettes    Last attempt to quit: 09/04/2009    Years since quitting: 9.4  . Smokeless tobacco: Never Used  Substance and Sexual Activity  . Alcohol use: Yes    Alcohol/week: 46.0 - 53.0 standard drinks    Types: 28 - 35 Cans of beer, 18 Standard drinks or equivalent per week     Comment: everyday drinker amount varies   . Drug use: Yes    Types: Marijuana    Comment: marijuana occassionally  . Sexual activity: Yes  Lifestyle  . Physical activity:    Days per week: 0 days    Minutes per session: 0 min  . Stress: Not at all  Relationships  . Social connections:    Talks on phone: More than three times a week    Gets together: More than three times a week    Attends religious service: Not on file    Active member of club or organization: No    Attends meetings of clubs or organizations: Never    Relationship status: Not on file  Other Topics Concern  . Not on file  Social History Narrative  . Not on file    Outpatient Encounter Medications as of 02/01/2019  Medication Sig  . albuterol (PROVENTIL HFA;VENTOLIN HFA) 108 (90 BASE) MCG/ACT inhaler Inhale 2 puffs into the lungs every 6 (six) hours as needed for wheezing or shortness of breath.  Marland Kitchen aspirin EC 81 MG tablet Take 81 mg by mouth daily.  Marland Kitchen atorvastatin (LIPITOR) 80 MG tablet Take 1 tablet (80 mg total) by mouth daily.  . diclofenac sodium (VOLTAREN) 1 % GEL Apply 2 g topically 4 (four) times daily as needed.  . gabapentin (NEURONTIN) 100 MG capsule Take 1 capsule (100 mg total) by mouth 3 (three) times daily.  Marland Kitchen HYDROcodone-acetaminophen (NORCO) 5-325 MG tablet 1-2 tabs po q6 hours prn pain  . lisinopril (PRINIVIL,ZESTRIL) 20 MG tablet Take 1 tablet (20 mg total) by mouth daily.  . Omega-3 Fatty Acids (FISH OIL) 1000 MG CAPS Take 1,000 mg by mouth 2 (two) times daily.  Marland Kitchen omeprazole (PRILOSEC) 20 MG capsule Take 1 capsule (20 mg total) by mouth 2 (two) times daily.  Marland Kitchen terazosin (HYTRIN) 2 MG capsule Take 1 capsule (2 mg total) by mouth at bedtime.  Marland Kitchen tiotropium (SPIRIVA) 18 MCG inhalation capsule Place 18 mcg into inhaler and inhale daily.   No facility-administered encounter medications on file as of 02/01/2019.     Activities of Daily Living In your present state of health, do you have any  difficulty performing the following activities: 02/01/2019 08/18/2018  Hearing? N N  Vision? N N  Difficulty concentrating or making decisions? N N  Walking or climbing stairs? N N  Dressing or bathing? N N  Doing errands, shopping? N -  Preparing Food and eating ? N -  Using the Toilet? N -  In the past six months, have you accidently leaked urine? N -  Do you have problems with loss of bowel control? N -  Managing your Medications? N -  Managing your Finances? N -  Housekeeping or managing your Housekeeping? N -  Some recent data might be hidden  Patient Care Team: Corwin Levins, MD as PCP - General Waymon Budge, MD as Consulting Physician (Pulmonary Disease)   Assessment:   This is a routine wellness examination for Benjamim. Physical assessment deferred to PCP.  Exercise Activities and Dietary recommendations Current Exercise Habits: The patient has a physically strenuous job, but has no regular exercise apart from work.(works at [employer] x3 weekly)  Diet (meal preparation, eat out, water intake, caffeinated beverages, dairy products, fruits and vegetables): in general, a "healthy" diet     Reviewed heart healthy diet. Encouraged patient to increase daily water and healthy fluid intake. Diet education was provided via handout.  Goals    . decrease the amount of beer I drink     Plan to have no more than 5 beers daily.     . Patient Stated     Increase my physical activity by going to [employer] more often.       Fall Risk Fall Risk  02/01/2019 03/09/2018 12/18/2016 02/12/2016 04/23/2015  Falls in the past year? 0 Yes No No No  Number falls in past yr: 0 1 - - -  Injury with Fall? - No - - -   Depression Screen PHQ 2/9 Scores 02/01/2019 03/09/2018 12/18/2016 02/12/2016  PHQ - 2 Score 0 0 0 0    Cognitive Function MMSE - Mini Mental State Exam 02/01/2019 12/18/2016  Orientation to time 5 5  Orientation to Place 5 5  Registration 3 3  Attention/ Calculation 5 5   Recall 3 3  Language- name 2 objects 2 2  Language- repeat 1 1  Language- follow 3 step command 3 3  Language- read & follow direction 1 1  Write a sentence 1 1  Copy design 1 1  Total score 30 30        Immunization History  Administered Date(s) Administered  . Influenza Split 08/27/2013, 07/28/2017, 09/16/2018  . Influenza Whole 08/17/2005, 09/16/2009  . Influenza, High Dose Seasonal PF 08/18/2017  . Influenza,inj,Quad PF,6+ Mos 07/27/2016  . Pneumococcal Conjugate-13 04/10/2014  . Pneumococcal Polysaccharide-23 08/19/2006, 03/09/2018  . Zoster 08/19/2006   Screening Tests Health Maintenance  Topic Date Due  . COLONOSCOPY  12/22/2021  . TETANUS/TDAP  04/16/2022  . INFLUENZA VACCINE  Completed  . Hepatitis C Screening  Completed  . PNA vac Low Risk Adult  Completed       Plan:      Reviewed health maintenance screenings with patient today and relevant education, vaccines, and/or referrals were provided.   Continue doing brain stimulating activities (puzzles, reading, adult coloring books, staying active) to keep memory sharp.   Continue to eat heart healthy diet (full of fruits, vegetables, whole grains, lean protein, water--limit salt, fat, and sugar intake) and increase physical activity as tolerated.  I have personally reviewed and noted the following in the patient's chart:   . Medical and social history . Use of alcohol, tobacco or illicit drugs  . Current medications and supplements . Functional ability and status . Nutritional status . Physical activity . Advanced directives . List of other physicians . Vitals . Screenings to include cognitive, depression, and falls . Referrals and appointments  In addition, I have reviewed and discussed with patient certain preventive protocols, quality metrics, and best practice recommendations. A written personalized care plan for preventive services as well as general preventive health recommendations were provided  to patient.     Wanda Plump, RN  02/01/2019  Medical screening examination/treatment/procedure(s)  were performed by non-physician practitioner and as supervising physician I was immediately available for consultation/collaboration. I agree with above. Cathlean Cower, MD

## 2019-02-01 ENCOUNTER — Ambulatory Visit: Payer: PPO

## 2019-02-01 ENCOUNTER — Other Ambulatory Visit: Payer: Self-pay

## 2019-02-01 ENCOUNTER — Ambulatory Visit (INDEPENDENT_AMBULATORY_CARE_PROVIDER_SITE_OTHER): Payer: PPO | Admitting: *Deleted

## 2019-02-01 VITALS — BP 131/76 | HR 71 | Resp 17 | Ht 71.0 in | Wt 165.0 lb

## 2019-02-01 DIAGNOSIS — Z0000 Encounter for general adult medical examination without abnormal findings: Secondary | ICD-10-CM | POA: Diagnosis not present

## 2019-02-01 NOTE — Patient Instructions (Addendum)
Continue doing brain stimulating activities (puzzles, reading, adult coloring books, staying active) to keep memory sharp.   Continue to eat heart healthy diet (full of fruits, vegetables, whole grains, lean protein, water--limit salt, fat, and sugar intake) and increase physical activity as tolerated.   [name] , Thank you for taking time to come for your Medicare Wellness Visit. I appreciate your ongoing commitment to your health goals. Please review the following plan we discussed and let me know if I can assist you in the future.   These are the goals we discussed: Goals    . decrease the amount of beer I drink     Plan to have no more than 5 beers daily.     . Patient Stated     Increase my physical activity by going to [employer] more often.       This is a list of the screening recommended for you and due dates:  Health Maintenance  Topic Date Due  . Colon Cancer Screening  12/22/2021  . Tetanus Vaccine  04/16/2022  . Flu Shot  Completed  .  Hepatitis C: One time screening is recommended by Center for Disease Control  (CDC) for  adults born from [birth year] through 1965.   Completed  . Pneumonia vaccines  Completed    Preventive Care 57 Years and Older, Male Preventive care refers to lifestyle choices and visits with your health care provider that can promote health and wellness. What does preventive care include?   A yearly physical exam. This is also called an annual well check.  Dental exams once or twice a year.  Routine eye exams. Ask your health care provider how often you should have your eyes checked.  Personal lifestyle choices, including: ? Daily care of your teeth and gums. ? Regular physical activity. ? Eating a healthy diet. ? Avoiding tobacco and drug use. ? Limiting alcohol use. ? Practicing safe sex. ? Taking low doses of aspirin every day. ? Taking vitamin and mineral supplements as recommended by your health care provider. What happens during  an annual well check? The services and screenings done by your health care provider during your annual well check will depend on your age, overall health, lifestyle risk factors, and family history of disease. Counseling Your health care provider may ask you questions about your:  Alcohol use.  Tobacco use.  Drug use.  Emotional well-being.  Home and relationship well-being.  Sexual activity.  Eating habits.  History of falls.  Memory and ability to understand (cognition).  Work and work Statistician. Screening You may have the following tests or measurements:  Height, weight, and BMI.  Blood pressure.  Lipid and cholesterol levels. These may be checked every 5 years, or more frequently if you are over 62 years old.  Skin check.  Lung cancer screening. You may have this screening every year starting at age 89 if you have a 30-pack-year history of smoking and currently smoke or have quit within the past 15 years.  Colorectal cancer screening. All adults should have this screening starting at age 12 and continuing until age 71. You will have tests every 1-10 years, depending on your results and the type of screening test. People at increased risk should start screening at an earlier age. Screening tests may include: ? Guaiac-based fecal occult blood testing. ? Fecal immunochemical test (FIT). ? Stool DNA test. ? Virtual colonoscopy. ? Sigmoidoscopy. During this test, a flexible tube with a tiny camera (sigmoidoscope) is  used to examine your rectum and lower colon. The sigmoidoscope is inserted through your anus into your rectum and lower colon. ? Colonoscopy. During this test, a long, thin, flexible tube with a tiny camera (colonoscope) is used to examine your entire colon and rectum.  Prostate cancer screening. Recommendations will vary depending on your family history and other risks.  Hepatitis C blood test.  Hepatitis B blood test.  Sexually transmitted disease  (STD) testing.  Diabetes screening. This is done by checking your blood sugar (glucose) after you have not eaten for a while (fasting). You may have this done every 1-3 years.  Abdominal aortic aneurysm (AAA) screening. You may need this if you are a current or former smoker.  Osteoporosis. You may be screened starting at age 85 if you are at high risk. Talk with your health care provider about your test results, treatment options, and if necessary, the need for more tests. Vaccines Your health care provider may recommend certain vaccines, such as:  Influenza vaccine. This is recommended every year.  Tetanus, diphtheria, and acellular pertussis (Tdap, Td) vaccine. You may need a Td booster every 10 years.  Varicella vaccine. You may need this if you have not been vaccinated.  Zoster vaccine. You may need this after age 33.  Measles, mumps, and rubella (MMR) vaccine. You may need at least one dose of MMR if you were born in 1957 or later. You may also need a second dose.  Pneumococcal 13-valent conjugate (PCV13) vaccine. One dose is recommended after age 62.  Pneumococcal polysaccharide (PPSV23) vaccine. One dose is recommended after age 92.  Meningococcal vaccine. You may need this if you have certain conditions.  Hepatitis A vaccine. You may need this if you have certain conditions or if you travel or work in places where you may be exposed to hepatitis A.  Hepatitis B vaccine. You may need this if you have certain conditions or if you travel or work in places where you may be exposed to hepatitis B.  Haemophilus influenzae type b (Hib) vaccine. You may need this if you have certain risk factors. Talk to your health care provider about which screenings and vaccines you need and how often you need them. This information is not intended to replace advice given to you by your health care provider. Make sure you discuss any questions you have with your health care provider. Document  Released: 11/29/2015 Document Revised: 12/23/2017 Document Reviewed: 09/03/2015 Elsevier Interactive Patient Education  2019 [employer].

## 2019-02-23 ENCOUNTER — Telehealth: Payer: Self-pay | Admitting: Internal Medicine

## 2019-02-23 MED ORDER — FLUTICASONE-UMECLIDIN-VILANT 100-62.5-25 MCG/INH IN AEPB: 1.0000 | INHALATION_SPRAY | Freq: Every day | RESPIRATORY_TRACT | 3 refills | Status: DC

## 2019-02-23 NOTE — Telephone Encounter (Signed)
Called and spoke with patient regarding CY recommendations of CXR back from 01/23/2019 CY recommends to try trelegy and stop the spirivia. Pt expressed and verbalized understanding Placed order today for trelegy to [facility] per pt request Nothing further needed.

## 2019-03-01 ENCOUNTER — Telehealth: Payer: Self-pay | Admitting: Internal Medicine

## 2019-03-01 NOTE — Telephone Encounter (Signed)
Called and spoke with pt who wanted to know if he should continue taking spiriva with trelegy and I stated to him to take trelegy only due to it having three meds in one inhaler. Pt expressed understanding. Nothing further needed.

## 2019-03-27 ENCOUNTER — Other Ambulatory Visit (INDEPENDENT_AMBULATORY_CARE_PROVIDER_SITE_OTHER): Payer: PPO

## 2019-03-27 ENCOUNTER — Ambulatory Visit (INDEPENDENT_AMBULATORY_CARE_PROVIDER_SITE_OTHER): Payer: PPO | Admitting: Internal Medicine

## 2019-03-27 ENCOUNTER — Encounter: Payer: Self-pay | Admitting: Internal Medicine

## 2019-03-27 ENCOUNTER — Other Ambulatory Visit: Payer: Self-pay

## 2019-03-27 VITALS — BP 118/60 | HR 93 | Temp 99.1°F | Ht 71.0 in | Wt 167.0 lb

## 2019-03-27 DIAGNOSIS — Z0001 Encounter for general adult medical examination with abnormal findings: Secondary | ICD-10-CM

## 2019-03-27 DIAGNOSIS — I10 Essential (primary) hypertension: Secondary | ICD-10-CM | POA: Diagnosis not present

## 2019-03-27 DIAGNOSIS — R7302 Impaired glucose tolerance (oral): Secondary | ICD-10-CM | POA: Diagnosis not present

## 2019-03-27 DIAGNOSIS — Z0000 Encounter for general adult medical examination without abnormal findings: Secondary | ICD-10-CM | POA: Diagnosis not present

## 2019-03-27 DIAGNOSIS — R21 Rash and other nonspecific skin eruption: Secondary | ICD-10-CM | POA: Diagnosis not present

## 2019-03-27 DIAGNOSIS — J449 Chronic obstructive pulmonary disease, unspecified: Secondary | ICD-10-CM

## 2019-03-27 LAB — CBC WITH DIFFERENTIAL/PLATELET
Basophils Absolute: 0.1 10*3/uL (ref 0.0–0.1)
Basophils Relative: 0.8 % (ref 0.0–3.0)
Eosinophils Absolute: 0.4 10*3/uL (ref 0.0–0.7)
Eosinophils Relative: 6.5 % — ABNORMAL HIGH (ref 0.0–5.0)
HCT: 42.6 % (ref 39.0–52.0)
Hemoglobin: 14.8 g/dL (ref 13.0–17.0)
Lymphocytes Relative: 32.9 % (ref 12.0–46.0)
Lymphs Abs: 2.2 10*3/uL (ref 0.7–4.0)
MCHC: 34.7 g/dL (ref 30.0–36.0)
MCV: 95.3 fl (ref 78.0–100.0)
Monocytes Absolute: 0.7 10*3/uL (ref 0.1–1.0)
Monocytes Relative: 11 % (ref 3.0–12.0)
Neutro Abs: 3.3 10*3/uL (ref 1.4–7.7)
Neutrophils Relative %: 48.8 % (ref 43.0–77.0)
Platelets: 288 10*3/uL (ref 150.0–400.0)
RBC: 4.48 Mil/uL (ref 4.22–5.81)
RDW: 12.7 % (ref 11.5–15.5)
WBC: 6.8 10*3/uL (ref 4.0–10.5)

## 2019-03-27 LAB — LIPID PANEL
Cholesterol: 128 mg/dL (ref 0–200)
HDL: 39.7 mg/dL (ref >39.00)
LDL Cholesterol: 65 mg/dL (ref 0–99)
NonHDL: 88.21
Total CHOL/HDL Ratio: 3
Triglycerides: 114 mg/dL (ref 0.0–149.0)
VLDL: 22.8 mg/dL (ref 0.0–40.0)

## 2019-03-27 LAB — URINALYSIS, ROUTINE W REFLEX MICROSCOPIC
Bilirubin Urine: NEGATIVE
Hgb urine dipstick: NEGATIVE
Ketones, ur: NEGATIVE
Leukocytes,Ua: NEGATIVE
Nitrite: NEGATIVE
RBC / HPF: NONE SEEN (ref 0–?)
Specific Gravity, Urine: 1.025 (ref 1.000–1.030)
Total Protein, Urine: NEGATIVE
Urine Glucose: NEGATIVE
Urobilinogen, UA: 0.2 (ref 0.0–1.0)
WBC, UA: NONE SEEN (ref 0–?)
pH: 5.5 (ref 5.0–8.0)

## 2019-03-27 LAB — BASIC METABOLIC PANEL
BUN: 16 mg/dL (ref 6–23)
CO2: 26 mEq/L (ref 19–32)
Calcium: 9.4 mg/dL (ref 8.4–10.5)
Chloride: 106 mEq/L (ref 96–112)
Creatinine, Ser: 1.12 mg/dL (ref 0.40–1.50)
GFR: 77.74 mL/min (ref >60.00)
Glucose, Bld: 100 mg/dL — ABNORMAL HIGH (ref 70–99)
Potassium: 4.2 mEq/L (ref 3.5–5.1)
Sodium: 139 mEq/L (ref 135–145)

## 2019-03-27 LAB — HEPATIC FUNCTION PANEL
ALT: 26 U/L (ref 0–53)
AST: 22 U/L (ref 0–37)
Albumin: 4.3 g/dL (ref 3.5–5.2)
Alkaline Phosphatase: 84 U/L (ref 39–117)
Bilirubin, Direct: 0 mg/dL (ref 0.0–0.3)
Total Bilirubin: 0.4 mg/dL (ref 0.2–1.2)
Total Protein: 7.3 g/dL (ref 6.0–8.3)

## 2019-03-27 LAB — HEMOGLOBIN A1C: Hgb A1c MFr Bld: 5.9 % (ref 4.6–6.5)

## 2019-03-27 LAB — PSA: PSA: 1.3 ng/mL (ref 0.10–4.00)

## 2019-03-27 LAB — TSH: TSH: 0.82 u[IU]/mL (ref 0.35–4.50)

## 2019-03-27 MED ORDER — CLOTRIMAZOLE-BETAMETHASONE 1-0.05 % EX CREA: TOPICAL_CREAM | CUTANEOUS | 1 refills | Status: DC

## 2019-03-27 MED ORDER — FLUTICASONE PROPIONATE 0.05 % EX CREA: TOPICAL_CREAM | Freq: Two times a day (BID) | CUTANEOUS | 1 refills | Status: DC

## 2019-03-27 NOTE — Progress Notes (Signed)
Subjective:    Patient ID: Ryan Diaz, male    DOB: 1946-10-25, 73 y.o.   MRN: [id number]  HPI Here for wellness and f/u;  Overall doing ok;  Pt denies Chest pain, worsening SOB, DOE, wheezing, orthopnea, PND, worsening LE edema, palpitations, dizziness or syncope.  Pt denies neurological change such as new headache, facial or extremity weakness.  Pt denies polydipsia, polyuria, or low sugar symptoms. Pt states overall good compliance with treatment and medications, good tolerability, and has been trying to follow appropriate diet.  Pt denies worsening depressive symptoms, suicidal ideation or panic. No fever, night sweats, wt loss, loss of appetite, or other constitutional symptoms.  Pt states good ability with ADL's, has low fall risk, home safety reviewed and adequate, no other significant changes in hearing or vision, and only occasionally active with exercise.  S/p bilat cts surgury, now on CPAP per Dr Maple Hudson, now 70% disabled at [facility] after including right hearing loss.   Also c/o rash to face as per prevoius where fluticasone cr 0/05% per derm has worked nicely, mild, intermittent, nothing makes better or worse for > 1 yr.  Also for the past month has had a different type of rash to the feet and legs with itching and scaliness for just over 1 month, with itching and just cant stop scratching but just makes it worse.   Past Medical History:  Diagnosis Date  . 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 ALLERGIC RHINITIS 08/08/2007  . ANXIETY 08/08/2007  . Carpal tunnel syndrome   . COPD 10/03/2009  . DEPRESSION 08/08/2007  . ERECTILE DYSFUNCTION 08/08/2007  . GENITAL HERPES, HX OF 08/08/2007  . GERD 08/08/2007  . GLUCOSE INTOLERANCE 08/08/2007  . HYPERLIPIDEMIA 08/08/2007  . HYPERTENSION 08/08/2007  . Impaired glucose tolerance 09/26/2011  . Insomnia 05/18/2012  . PAIN IN SOFT TISSUES OF LIMB 10/02/2009  . Sleep apnea    OSA-uses CPAP nightly   Past Surgical History:  Procedure Laterality Date  .  ACHILLES TENDON REPAIR Bilateral   . CARPAL TUNNEL RELEASE Right 08/18/2018   Procedure: RIGHT CARPAL TUNNEL RELEASE;  Surgeon: Betha Loa, MD;  Location: [facility];  Service: Orthopedics;  Laterality: Right;  . CARPAL TUNNEL RELEASE Left 11/21/2018   Procedure: LEFT CARPAL TUNNEL RELEASE;  Surgeon: Betha Loa, MD;  Location:  SURGERY CENTER;  Service: Orthopedics;  Laterality: Left;  Bier block  . COLONOSCOPY      reports that he quit smoking about 9 years ago. His smoking use included cigarettes. He has a 52.50 pack-year smoking history. He has never used smokeless tobacco. He reports current alcohol use of about 46.0 - 53.0 standard drinks of alcohol per week. He reports current drug use. Drug: Marijuana. family history includes Colon polyps in his brother. No Known Allergies Current Outpatient Medications on File Prior to Visit  Medication Sig Dispense Refill  . albuterol (PROVENTIL HFA;VENTOLIN HFA) 108 (90 BASE) MCG/ACT inhaler Inhale 2 puffs into the lungs every 6 (six) hours as needed for wheezing or shortness of breath.    Marland Kitchen aspirin EC 81 MG tablet Take 81 mg by mouth daily.    Marland Kitchen atorvastatin (LIPITOR) 80 MG tablet Take 1 tablet (80 mg total) by mouth daily. 90 tablet 3  . diclofenac sodium (VOLTAREN) 1 % GEL Apply 2 g topically 4 (four) times daily as needed. 100 g 5  . Fluticasone-Umeclidin-Vilant (TRELEGY ELLIPTA) 100-62.5-25 MCG/INH AEPB Inhale 1 puff into the lungs daily. 1 each 3  .  gabapentin (NEURONTIN) 100 MG capsule Take 1 capsule (100 mg total) by mouth 3 (three) times daily. 90 capsule 5  . HYDROcodone-acetaminophen (NORCO) 5-325 MG tablet 1-2 tabs po q6 hours prn pain 20 tablet 0  . ipratropium (ATROVENT) 0.06 % nasal spray as needed.    Marland Kitchen lisinopril (ZESTRIL) 10 MG tablet Take 1 tablet by mouth daily.    . Omega-3 Fatty Acids (FISH OIL) 1000 MG CAPS Take 1,000 mg by mouth 2 (two) times daily.    Marland Kitchen omeprazole (PRILOSEC) 20 MG capsule Take 1  capsule (20 mg total) by mouth 2 (two) times daily. 90 capsule 3  . terazosin (HYTRIN) 2 MG capsule Take 1 capsule (2 mg total) by mouth at bedtime. 90 capsule 3  . tiotropium (SPIRIVA) 18 MCG inhalation capsule Place 18 mcg into inhaler and inhale daily.    Marland Kitchen lisinopril (PRINIVIL,ZESTRIL) 20 MG tablet Take 1 tablet (20 mg total) by mouth daily. (Patient not taking: Reported on 03/27/2019) 90 tablet 3   No current facility-administered medications on file prior to visit.    ROS: Constitutional: Negative for other unusual diaphoresis, sweats, appetite or weight changes HENT: Negative for other worsening hearing loss, ear pain, facial swelling, mouth sores or neck stiffness.   Eyes: Negative for other worsening pain, redness or other visual disturbance.  Respiratory: Negative for other stridor or swelling Cardiovascular: Negative for other palpitations or other chest pain  Gastrointestinal: Negative for worsening diarrhea or loose stools, blood in stool, distention or other pain Genitourinary: Negative for hematuria, flank pain or other change in urine volume.  Musculoskeletal: Negative for myalgias or other joint swelling.  Skin: Negative for other color change, or other wound or worsening drainage.  Neurological: Negative for other syncope or numbness. Hematological: Negative for other adenopathy or swelling Psychiatric/Behavioral: Negative for hallucinations, other worsening agitation, SI, self-injury, or new decreased concentration All other system neg per pt    Objective:   Physical Exam BP 118/60 (BP Location: Left Arm, Patient Position: Sitting, Cuff Size: Normal)   Pulse 93   Temp 99.1 F (37.3 C) (Oral)   Ht 5\' 11"  (1.803 m)   Wt 167 lb (75.8 kg)   SpO2 95%   BMI 23.29 kg/m  VS noted,  Constitutional: Pt is oriented to person, place, and time. Appears well-developed and well-nourished, in no significant distress and comfortable Head: Normocephalic and atraumatic  Eyes:  Conjunctivae and EOM are normal. Pupils are equal, round, and reactive to light Right Ear: External ear normal without discharge Left Ear: External ear normal without discharge Nose: Nose without discharge or deformity Mouth/Throat: Oropharynx is without other ulcerations and moist  Neck: Normal range of motion. Neck supple. No JVD present. No tracheal deviation present or significant neck LA or mass Cardiovascular: Normal rate, regular rhythm, normal heart sounds and intact distal pulses.   Pulmonary/Chest: WOB normal and breath sounds without rales or wheezing  Abdominal: Soft. Bowel sounds are normal. NT. No HSM  Musculoskeletal: Normal range of motion. Exhibits no edema Lymphadenopathy: Has no other cervical adenopathy.  Neurological: Pt is alert and oriented to person, place, and time. Pt has normal reflexes. No cranial nerve deficit. Motor grossly intact, Gait intact Skin: Skin is warm and dry. No new ulcerations, has trace eczematous lesions to face, but more prominent scaly eczematous lesions to distal LLE and foot, primarily lateral aspect Psychiatric:  Has normal mood and affect. Behavior is normal without agitation No other exam findings Lab Results  Component Value Date  WBC 6.8 03/27/2019   HGB 14.8 03/27/2019   HCT 42.6 03/27/2019   PLT 288.0 03/27/2019   GLUCOSE 100 (H) 03/27/2019   CHOL 128 03/27/2019   TRIG 114.0 03/27/2019   HDL 39.70 03/27/2019   LDLDIRECT 83.0 10/19/2018   LDLCALC 65 03/27/2019   ALT 26 03/27/2019   AST 22 03/27/2019   NA 139 03/27/2019   K 4.2 03/27/2019   CL 106 03/27/2019   CREATININE 1.12 03/27/2019   BUN 16 03/27/2019   CO2 26 03/27/2019   TSH 0.82 03/27/2019   PSA 1.30 03/27/2019   HGBA1C 5.9 03/27/2019       Assessment & Plan:

## 2019-03-27 NOTE — Patient Instructions (Signed)
Please take all new medication as prescribed - the antifungal/steroid cream for the feet and legs  Please continue all other medications as before, and refills have been done if requested for the steroid cream for the face  Please have the pharmacy call with any other refills you may need.  Please continue your efforts at being more active, low cholesterol diet, and weight control.  You are otherwise up to date with prevention measures today.  Please keep your appointments with your specialists as you may have planned  Please go to the LAB in the Basement (turn left off the elevator) for the tests to be done today  You will be contacted by phone if any changes need to be made immediately.  Otherwise, you will receive a letter about your results with an explanation, but please check with MyChart first.  Please remember to sign up for MyChart if you have not done so, as this will be important to you in the future with finding out test results, communicating by private email, and scheduling acute appointments online when needed.  Please return in 6 months, or sooner if needed

## 2019-03-29 ENCOUNTER — Ambulatory Visit: Payer: PPO | Admitting: Internal Medicine

## 2019-03-29 ENCOUNTER — Encounter: Payer: Self-pay | Admitting: Internal Medicine

## 2019-03-29 NOTE — Assessment & Plan Note (Signed)
stable overall by history and exam, recent data reviewed with pt, and pt to continue medical treatment as before,  to f/u any worsening symptoms or concerns  

## 2019-03-29 NOTE — Assessment & Plan Note (Signed)

## 2019-03-29 NOTE — Assessment & Plan Note (Addendum)
Chronic persistent, for fluticasone 0.05% asd,  to f/u any worsening symptoms or concerns  In addition to the time spent performing CPE, I spent an additional 25 minutes face to face,in which greater than 50% of this time was spent in counseling and coordination of care for patient's acute illness as documented, including the differential dx, treatment, further evaluation and other management of racial rash, leg rash, copd and htn and hyperglycemia

## 2019-03-29 NOTE — Assessment & Plan Note (Signed)
For trial lotrisone to distal LLE lesions,  to f/u any worsening symptoms or concerns

## 2019-04-14 DIAGNOSIS — G4733 Obstructive sleep apnea (adult) (pediatric): Secondary | ICD-10-CM | POA: Diagnosis not present

## 2019-05-18 DIAGNOSIS — G4733 Obstructive sleep apnea (adult) (pediatric): Secondary | ICD-10-CM | POA: Diagnosis not present

## 2019-06-05 ENCOUNTER — Telehealth: Payer: Self-pay

## 2019-06-05 NOTE — Telephone Encounter (Signed)
Very sorry, this is not a prescription medication approved by the FDA, it is only a supplement of unproven value with a lot of money spent on ads to older persons;  I would not take this, but that would up to him

## 2019-06-05 NOTE — Telephone Encounter (Signed)
Called pt, LVM.   

## 2019-06-05 NOTE — Telephone Encounter (Signed)
Copied from [place] [phone]. Topic: General - Other >> Jun 05, 2019 12:30 PM Celene Kras A wrote: Reason for CRM: Pt called and is requesting a prescription be sent in for prevgene, a memory loss medication. Pt was advised he may have to set up appt. Please advise.  [facility] ([place]), [place] - [address] [place] ([place] [address] Phone: [phone] Fax: [phone] Not a 24 hour pharmacy; exact hours not known.

## 2019-06-06 ENCOUNTER — Telehealth: Payer: Self-pay

## 2019-06-06 MED ORDER — DONEPEZIL HCL 5 MG PO TABS: 5.0000 mg | ORAL_TABLET | Freq: Every day | ORAL | 5 refills | Status: DC

## 2019-06-06 NOTE — Telephone Encounter (Signed)
Pt has been informed and expressed understanding. He mentioned that his memory loss has been discussed with PCP and would like a recommendation or a medication that is approved that he could try. Please advise.

## 2019-06-06 NOTE — Telephone Encounter (Signed)
Ok for aricept 5 qd - done erx 

## 2019-06-06 NOTE — Telephone Encounter (Signed)
Refer to previous telephone encounter.   Copied from [place] [phone]. Topic: General - Other >> Jun 06, 2019  2:31 PM Rainey Pines A wrote: Patient returned [place] call in regards to medication and would like a callback.

## 2019-06-06 NOTE — Addendum Note (Signed)
Addended by: Biagio Borg on: 06/06/2019 05:16 PM   Modules accepted: Orders

## 2019-06-07 NOTE — Telephone Encounter (Signed)
Pt has been informed.

## 2019-06-16 ENCOUNTER — Other Ambulatory Visit: Payer: Self-pay

## 2019-06-29 DIAGNOSIS — G4733 Obstructive sleep apnea (adult) (pediatric): Secondary | ICD-10-CM | POA: Diagnosis not present

## 2019-07-31 ENCOUNTER — Other Ambulatory Visit: Payer: Self-pay | Admitting: Internal Medicine

## 2019-08-04 ENCOUNTER — Other Ambulatory Visit: Payer: Self-pay

## 2019-08-04 ENCOUNTER — Ambulatory Visit (INDEPENDENT_AMBULATORY_CARE_PROVIDER_SITE_OTHER): Payer: PPO

## 2019-08-04 DIAGNOSIS — Z23 Encounter for immunization: Secondary | ICD-10-CM | POA: Diagnosis not present

## 2019-09-19 ENCOUNTER — Encounter: Payer: Self-pay | Admitting: Internal Medicine

## 2019-09-19 ENCOUNTER — Ambulatory Visit (INDEPENDENT_AMBULATORY_CARE_PROVIDER_SITE_OTHER): Payer: PPO | Admitting: Internal Medicine

## 2019-09-19 ENCOUNTER — Other Ambulatory Visit: Payer: Self-pay

## 2019-09-19 VITALS — BP 122/60 | HR 81 | Temp 98.9°F | Resp 16 | Ht 71.0 in | Wt 162.0 lb

## 2019-09-19 DIAGNOSIS — I10 Essential (primary) hypertension: Secondary | ICD-10-CM

## 2019-09-19 DIAGNOSIS — R634 Abnormal weight loss: Secondary | ICD-10-CM

## 2019-09-19 DIAGNOSIS — R21 Rash and other nonspecific skin eruption: Secondary | ICD-10-CM

## 2019-09-19 DIAGNOSIS — J309 Allergic rhinitis, unspecified: Secondary | ICD-10-CM | POA: Diagnosis not present

## 2019-09-19 DIAGNOSIS — R7302 Impaired glucose tolerance (oral): Secondary | ICD-10-CM | POA: Diagnosis not present

## 2019-09-19 DIAGNOSIS — J449 Chronic obstructive pulmonary disease, unspecified: Secondary | ICD-10-CM

## 2019-09-19 MED ORDER — IPRATROPIUM BROMIDE 0.06 % NA SOLN: 2.0000 | Freq: Three times a day (TID) | NASAL | 5 refills | Status: AC

## 2019-09-19 MED ORDER — FLUOCINONIDE 0.05 % EX OINT: 1.0000 "application " | TOPICAL_OINTMENT | Freq: Two times a day (BID) | CUTANEOUS | 2 refills | Status: DC

## 2019-09-19 MED ORDER — CETIRIZINE HCL 10 MG PO TABS: 10.0000 mg | ORAL_TABLET | Freq: Every day | ORAL | 11 refills | Status: DC

## 2019-09-19 NOTE — Assessment & Plan Note (Signed)
stable overall by history and exam, recent data reviewed with pt, and pt to continue medical treatment as before,  to f/u any worsening symptoms or concerns  

## 2019-09-19 NOTE — Assessment & Plan Note (Signed)
To restart the boost asd,  to f/u any worsening symptoms or concerns

## 2019-09-19 NOTE — Progress Notes (Signed)
Subjective:    Patient ID: Ryan Diaz, male    DOB: 1946/01/17, 73 y.o.   MRN: [id number]  HPI  Here to f/u; overall doing ok,  Pt denies chest pain, increasing sob or doe, wheezing, orthopnea, PND, increased LE swelling, palpitations, dizziness or syncope.  Pt denies new neurological symptoms such as new headache, or facial or extremity weakness or numbness.  Pt denies polydipsia, polyuria, or low sugar episode.  Pt states overall good compliance with meds, but current steroid cream not working well enough for persistent rough and itchy erythema to the fingertips worst to the left thumb.  Denies overuse of hands. Wt Readings from Last 3 Encounters:  09/19/19 162 lb (73.5 kg)  03/27/19 167 lb (75.8 kg)  02/01/19 165 lb (74.8 kg)  Does have several wks ongoing nasal allergy symptoms with clearish congestion, itch and sneezing, without fever, pain, ST, cough, swelling or wheezing.  Has recent worsening mild wt loss after stopping Boost, just does not have the appetite he had.   Pt denies fever, night sweats, loss of appetite, or other constitutional symptoms Past Medical History:  Diagnosis Date  . 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 ALLERGIC RHINITIS 08/08/2007  . ANXIETY 08/08/2007  . Carpal tunnel syndrome   . COPD 10/03/2009  . DEPRESSION 08/08/2007  . ERECTILE DYSFUNCTION 08/08/2007  . GENITAL HERPES, HX OF 08/08/2007  . GERD 08/08/2007  . GLUCOSE INTOLERANCE 08/08/2007  . HYPERLIPIDEMIA 08/08/2007  . HYPERTENSION 08/08/2007  . Impaired glucose tolerance 09/26/2011  . Insomnia 05/18/2012  . PAIN IN SOFT TISSUES OF LIMB 10/02/2009  . Sleep apnea    OSA-uses CPAP nightly   Past Surgical History:  Procedure Laterality Date  . ACHILLES TENDON REPAIR Bilateral   . CARPAL TUNNEL RELEASE Right 08/18/2018   Procedure: RIGHT CARPAL TUNNEL RELEASE;  Surgeon: Betha Loa, MD;  Location: [facility];  Service: Orthopedics;  Laterality: Right;  . CARPAL TUNNEL RELEASE Left  11/21/2018   Procedure: LEFT CARPAL TUNNEL RELEASE;  Surgeon: Betha Loa, MD;  Location: [facility];  Service: Orthopedics;  Laterality: Left;  Bier block  . COLONOSCOPY      reports that he quit smoking about 10 years ago. His smoking use included cigarettes. He has a 52.50 pack-year smoking history. He has never used smokeless tobacco. He reports current alcohol use of about 46.0 - 53.0 standard drinks of alcohol per week. He reports current drug use. Drug: Marijuana. family history includes Colon polyps in his brother. No Known Allergies Current Outpatient Medications on File Prior to Visit  Medication Sig Dispense Refill  . albuterol (PROVENTIL HFA;VENTOLIN HFA) 108 (90 BASE) MCG/ACT inhaler Inhale 2 puffs into the lungs every 6 (six) hours as needed for wheezing or shortness of breath.    Marland Kitchen aspirin EC 81 MG tablet Take 81 mg by mouth daily.    Marland Kitchen atorvastatin (LIPITOR) 80 MG tablet Take 1 tablet (80 mg total) by mouth daily. 90 tablet 3  . diclofenac sodium (VOLTAREN) 1 % GEL Apply 2 g topically 4 (four) times daily as needed. 100 g 5  . donepezil (ARICEPT) 5 MG tablet Take 1 tablet (5 mg total) by mouth at bedtime. 90 tablet 5  . gabapentin (NEURONTIN) 100 MG capsule Take 1 capsule (100 mg total) by mouth 3 (three) times daily. 90 capsule 5  . HYDROcodone-acetaminophen (NORCO) 5-325 MG tablet 1-2 tabs po q6 hours prn pain 20 tablet 0  . lisinopril (PRINIVIL,ZESTRIL) 20 MG tablet Take  1 tablet (20 mg total) by mouth daily. (Patient not taking: Reported on 03/27/2019) 90 tablet 3  . lisinopril (ZESTRIL) 10 MG tablet Take 1 tablet by mouth daily.    . Omega-3 Fatty Acids (FISH OIL) 1000 MG CAPS Take 1,000 mg by mouth 2 (two) times daily.    Marland Kitchen omeprazole (PRILOSEC) 20 MG capsule Take 1 capsule (20 mg total) by mouth 2 (two) times daily. 90 capsule 3  . terazosin (HYTRIN) 2 MG capsule Take 1 capsule (2 mg total) by mouth at bedtime. 90 capsule 3  . tiotropium (SPIRIVA) 18 MCG  inhalation capsule Place 18 mcg into inhaler and inhale daily.    . TRELEGY ELLIPTA 100-62.5-25 MCG/INH AEPB INHALE 1 PUFF INTO LUNGS ONCE DAILY 60 each 5   No current facility-administered medications on file prior to visit.    Review of Systems  Constitutional: Negative for other unusual diaphoresis or sweats HENT: Negative for ear discharge or swelling Eyes: Negative for other worsening visual disturbances Respiratory: Negative for stridor or other swelling  Gastrointestinal: Negative for worsening distension or other blood Genitourinary: Negative for retention or other urinary change Musculoskeletal: Negative for other MSK pain or swelling Skin: Negative for color change or other new lesions Neurological: Negative for worsening tremors and other numbness  Psychiatric/Behavioral: Negative for worsening agitation or other fatigue All otherwise neg per pt     Objective:   Physical Exam BP 122/60   Pulse 81   Temp 98.9 F (37.2 C)   Resp 16   Ht 5\' 11"  (1.803 m)   Wt 162 lb (73.5 kg)   SpO2 95%   BMI 22.59 kg/m  VS noted,  Constitutional: Pt appears in NAD HENT: Head: NCAT.  Right Ear: External ear normal.  Left Ear: External ear normal.  Eyes: . Pupils are equal, round, and reactive to light. Conjunctivae and EOM are normal Bilat tm's with mild erythema.  Max sinus areas non tender.  Pharynx with mild erythema, no exudate Nose: without d/c or deformity Neck: Neck supple. Gross normal ROM Cardiovascular: Normal rate and regular rhythm.   Pulmonary/Chest: Effort normal and breath sounds without rales or wheezing.  Abd:  Soft, NT, ND, + BS, no organomegaly Neurological: Pt is alert. At baseline orientation, motor grossly intact Skin: Skin is warm. No other new lesions, no LE edema but has dyshidrotic type skin changes to the fingertips worst to the left thumb Psychiatric: Pt behavior is normal without agitation  All otherwise neg per pt Lab Results  Component Value Date    WBC 6.8 03/27/2019   HGB 14.8 03/27/2019   HCT 42.6 03/27/2019   PLT 288.0 03/27/2019   GLUCOSE 100 (H) 03/27/2019   CHOL 128 03/27/2019   TRIG 114.0 03/27/2019   HDL 39.70 03/27/2019   LDLDIRECT 83.0 10/19/2018   LDLCALC 65 03/27/2019   ALT 26 03/27/2019   AST 22 03/27/2019   NA 139 03/27/2019   K 4.2 03/27/2019   CL 106 03/27/2019   CREATININE 1.12 03/27/2019   BUN 16 03/27/2019   CO2 26 03/27/2019   TSH 0.82 03/27/2019   PSA 1.30 03/27/2019   HGBA1C 5.9 03/27/2019        Assessment & Plan:

## 2019-09-19 NOTE — Patient Instructions (Signed)
Ok to take the stronger steroid cream for the fingers  Please take all new medication as prescribed - the zyrtec for allergies  Please continue all other medications as before, and refills have been done if requested - the atrovent nasal spray  Please have the pharmacy call with any other refills you may need.  Please continue your efforts at being more active, low cholesterol diet, and weight control..  Please keep your appointments with your specialists as you may have planned

## 2019-09-19 NOTE — Assessment & Plan Note (Signed)
Mild to mod, for start zyrtec 10 qd, and restart atrovent nasal, to f/u any worsening symptoms or concerns

## 2019-09-19 NOTE — Assessment & Plan Note (Signed)
To fingertips, for steroid cr tx as directed prn,  to f/u any worsening symptoms or concerns

## 2019-10-03 DIAGNOSIS — G4733 Obstructive sleep apnea (adult) (pediatric): Secondary | ICD-10-CM | POA: Diagnosis not present

## 2019-10-06 IMAGING — DX DG ANKLE COMPLETE 3+V*R*
3 series · 3 of 3 positions shown · non-contrast
Comparison: 05/03/2014

CLINICAL DATA: Acute right ankle pain. Injury getting out of a car
a couple of days ago. Initial encounter.

EXAM:
RIGHT ANKLE - COMPLETE 3+ VIEW

[ankle ap]
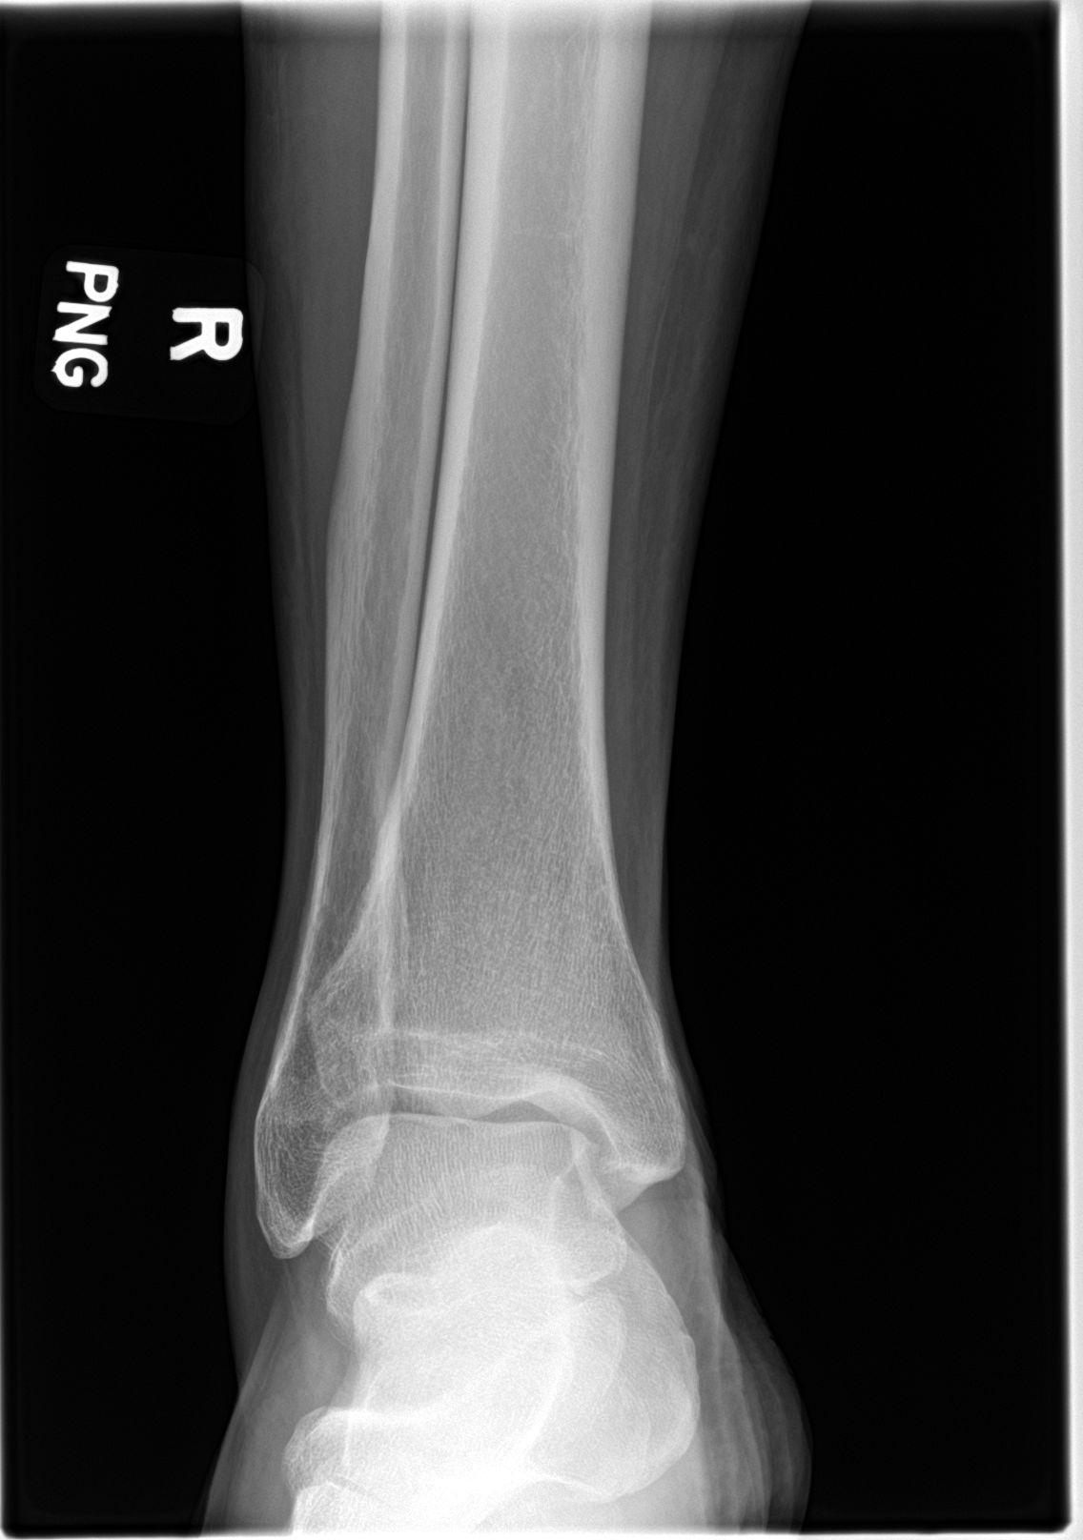

[ankle obl]
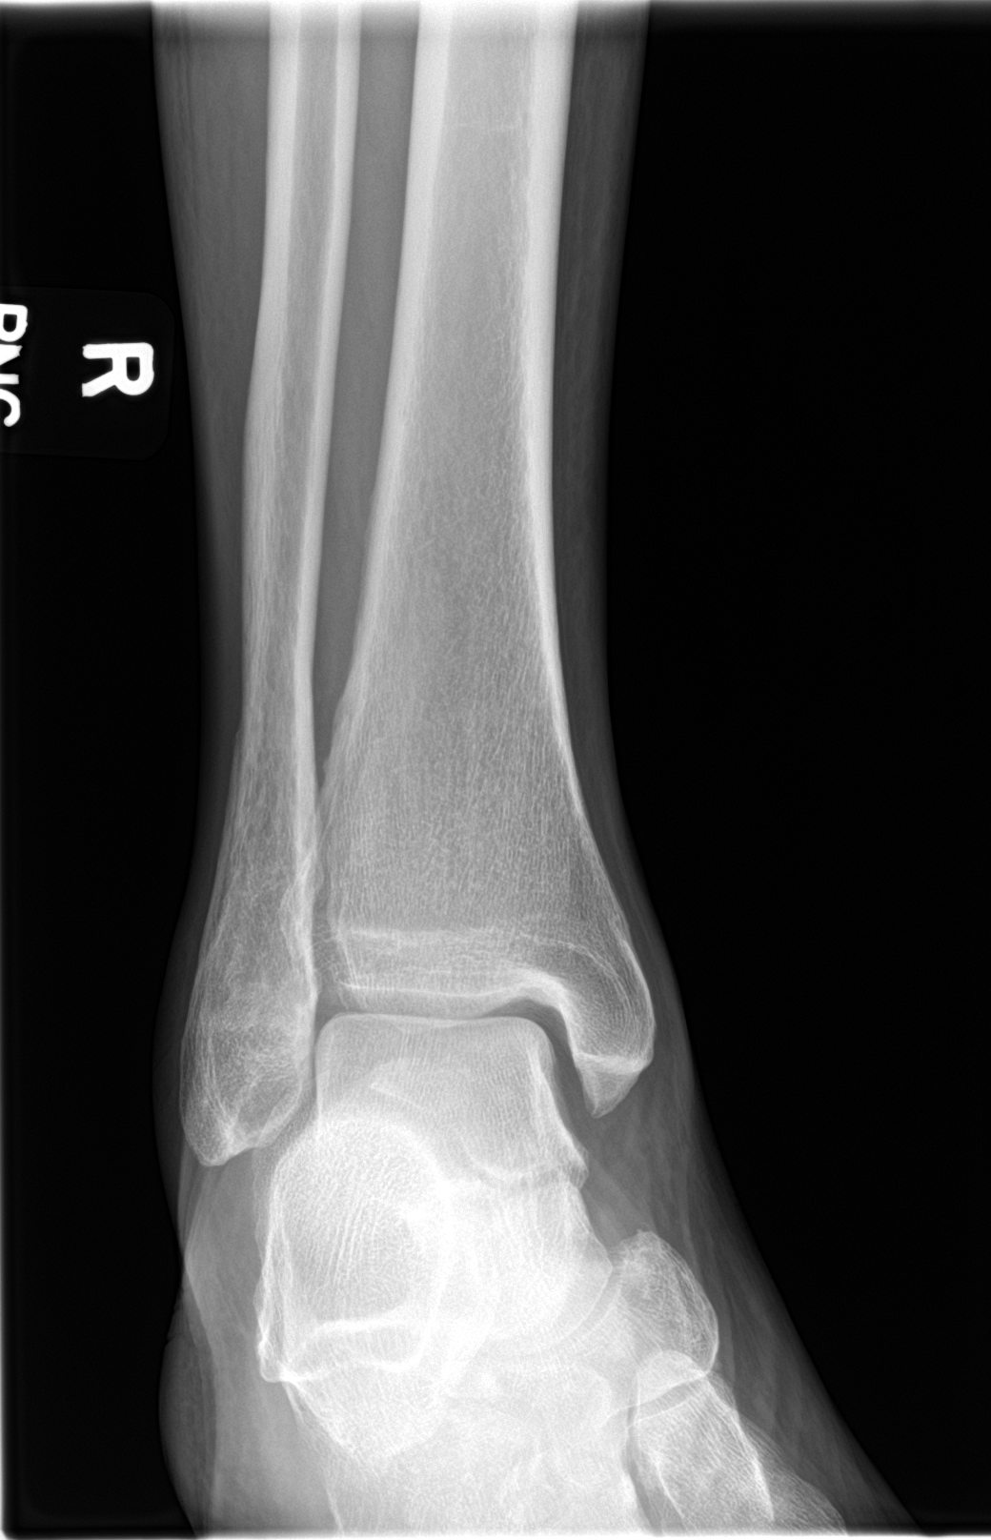

[ankle lat]
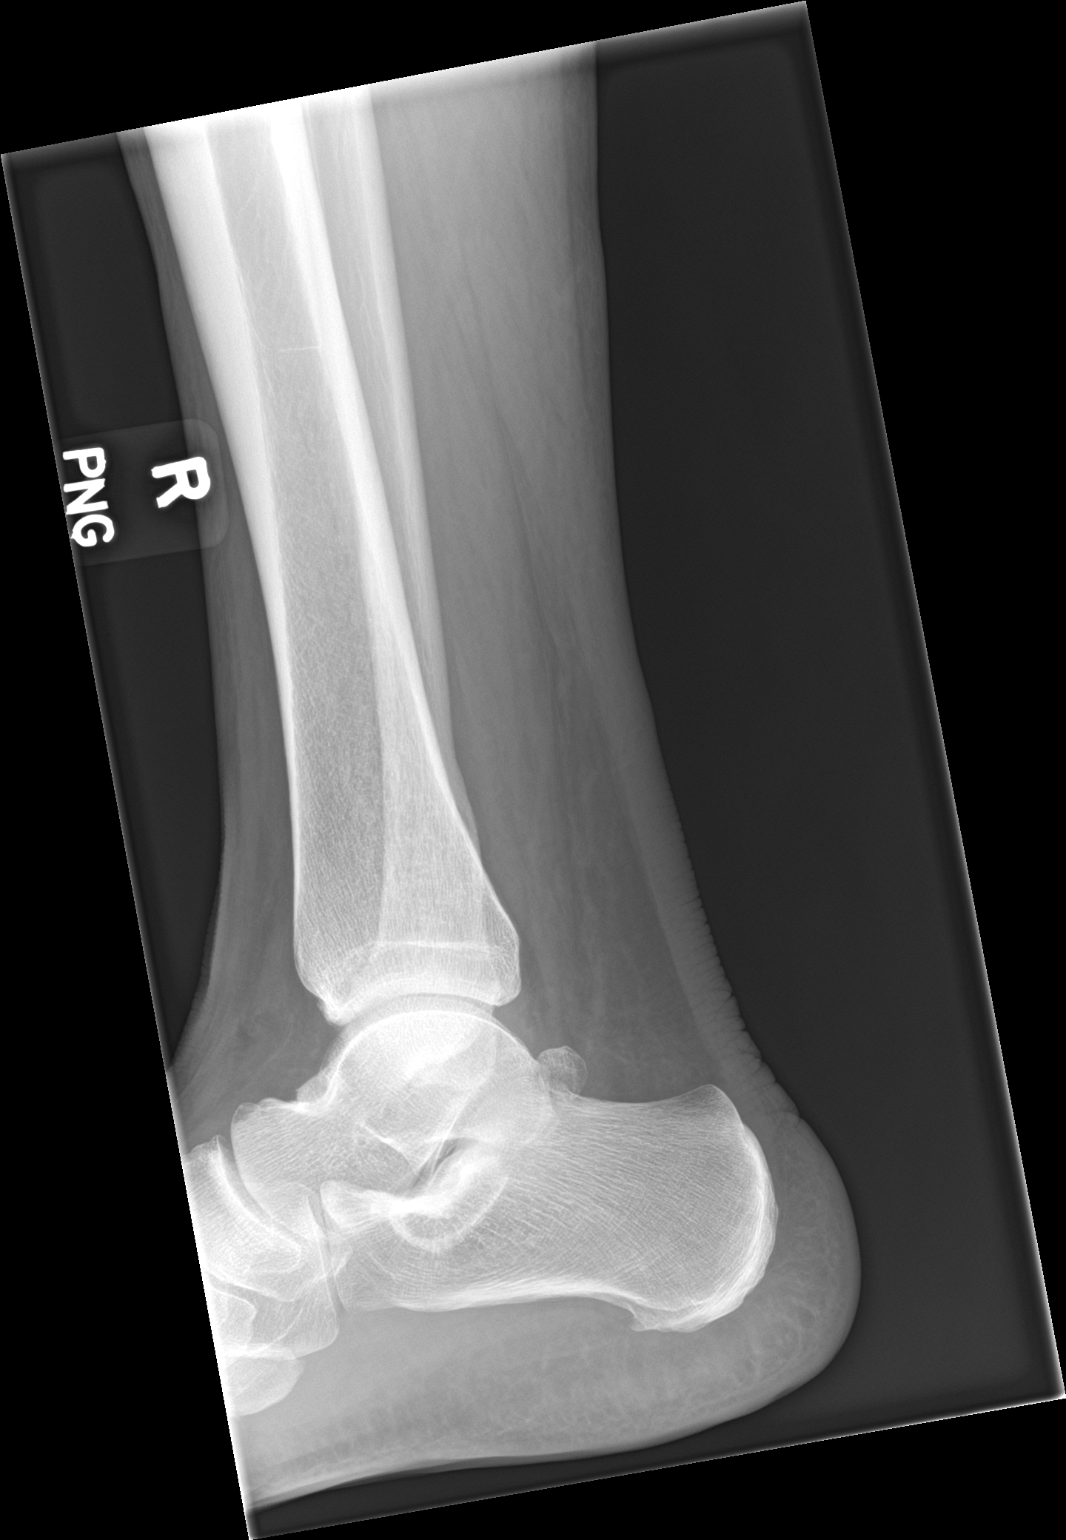

[3 of 3 positions shown; findings below may reference images not displayed]

FINDINGS: There is no evidence of fracture, dislocation, or joint effusion.
Tiny heel spur.
IMPRESSION: No acute finding.

## 2019-11-29 DIAGNOSIS — H1045 Other chronic allergic conjunctivitis: Secondary | ICD-10-CM | POA: Diagnosis not present

## 2019-11-29 DIAGNOSIS — H02831 Dermatochalasis of right upper eyelid: Secondary | ICD-10-CM | POA: Diagnosis not present

## 2019-11-29 DIAGNOSIS — R7309 Other abnormal glucose: Secondary | ICD-10-CM | POA: Diagnosis not present

## 2019-11-29 DIAGNOSIS — H04123 Dry eye syndrome of bilateral lacrimal glands: Secondary | ICD-10-CM | POA: Diagnosis not present

## 2019-11-29 DIAGNOSIS — H02834 Dermatochalasis of left upper eyelid: Secondary | ICD-10-CM | POA: Diagnosis not present

## 2019-11-29 DIAGNOSIS — H2513 Age-related nuclear cataract, bilateral: Secondary | ICD-10-CM | POA: Diagnosis not present

## 2019-12-06 ENCOUNTER — Emergency Department (HOSPITAL_COMMUNITY): Admission: EM | Admit: 2019-12-06 | Discharge: 2019-12-06 | Discharge status: Absent without leave | Payer: PPO

## 2019-12-06 DIAGNOSIS — G4733 Obstructive sleep apnea (adult) (pediatric): Secondary | ICD-10-CM | POA: Diagnosis not present

## 2019-12-08 ENCOUNTER — Ambulatory Visit: Payer: PPO

## 2019-12-09 ENCOUNTER — Ambulatory Visit: Payer: PPO | Attending: Internal Medicine

## 2019-12-09 DIAGNOSIS — Z23 Encounter for immunization: Secondary | ICD-10-CM | POA: Insufficient documentation

## 2019-12-09 NOTE — Progress Notes (Signed)
   Covid-19 Vaccination Clinic  Name:  Ryan Diaz    MRN: [id number] DOB: 1946-09-01  12/09/2019  [name] was observed post Covid-19 immunization for 15 minutes without incidence. He was provided with Vaccine Information Sheet and instruction to access the V-Safe system.   [name] was instructed to call 911 with any severe reactions post vaccine: Marland Kitchen Difficulty breathing  . Swelling of your face and throat  . A fast heartbeat  . A bad rash all over your body  . Dizziness and weakness    Immunizations Administered    Name Date Dose VIS Date Route   Pfizer COVID-19 Vaccine 12/09/2019 12:47 PM 0.3 mL 10/27/2019 Intramuscular   Manufacturer: [employer], [employer]   Lot: [id number]   NDC: [address]-5

## 2019-12-30 ENCOUNTER — Ambulatory Visit: Payer: PPO | Attending: Internal Medicine

## 2019-12-30 DIAGNOSIS — Z23 Encounter for immunization: Secondary | ICD-10-CM

## 2019-12-30 NOTE — Progress Notes (Signed)
   Covid-19 Vaccination Clinic  Name:  HARON BEILKE    MRN: [id number] DOB: 05/29/1946  12/30/2019  Mr. Sollers was observed post Covid-19 immunization for 15 minutes without incidence. He was provided with Vaccine Information Sheet and instruction to access the V-Safe system.   Mr. Kuehl was instructed to call 911 with any severe reactions post vaccine: Marland Kitchen Difficulty breathing  . Swelling of your face and throat  . A fast heartbeat  . A bad rash all over your body  . Dizziness and weakness    Immunizations Administered    Name Date Dose VIS Date Route   Pfizer COVID-19 Vaccine 12/30/2019 11:39 AM 0.3 mL 10/27/2019 Intramuscular   Manufacturer: [employer], [employer]   Lot: [id number]   NDC: [address]-1

## 2020-01-02 ENCOUNTER — Other Ambulatory Visit: Payer: Self-pay | Admitting: Internal Medicine

## 2020-01-16 ENCOUNTER — Telehealth: Payer: Self-pay

## 2020-01-16 NOTE — Telephone Encounter (Signed)
Patient had colonoscopy in 2013 next one due 2023.   Called patient and discussed this information

## 2020-01-16 NOTE — Telephone Encounter (Signed)
Patient calling and would like to know if he is due for a colonoscopy? If so, would like to know if the order could be placed.

## 2020-01-23 ENCOUNTER — Ambulatory Visit (INDEPENDENT_AMBULATORY_CARE_PROVIDER_SITE_OTHER): Payer: PPO

## 2020-01-23 ENCOUNTER — Other Ambulatory Visit: Payer: Self-pay

## 2020-01-23 ENCOUNTER — Encounter: Payer: Self-pay | Admitting: Internal Medicine

## 2020-01-23 ENCOUNTER — Ambulatory Visit (INDEPENDENT_AMBULATORY_CARE_PROVIDER_SITE_OTHER): Payer: PPO | Admitting: Internal Medicine

## 2020-01-23 VITALS — BP 116/60 | HR 89 | Temp 97.2°F | Ht 71.0 in | Wt 176.2 lb

## 2020-01-23 DIAGNOSIS — J449 Chronic obstructive pulmonary disease, unspecified: Secondary | ICD-10-CM

## 2020-01-23 DIAGNOSIS — G4733 Obstructive sleep apnea (adult) (pediatric): Secondary | ICD-10-CM

## 2020-01-23 MED ORDER — TRELEGY ELLIPTA 100-62.5-25 MCG/INH IN AEPB: 1.0000 | INHALATION_SPRAY | Freq: Every day | RESPIRATORY_TRACT | 12 refills | Status: DC

## 2020-01-23 NOTE — Patient Instructions (Signed)
Script sent refilling Trelegy inhaler  Order- CXR dx COPD mixed type  We can continue CPAP auto 5-20, mask of choice, humidifier, supplies. [facility]/ card  Please call if we can help

## 2020-01-23 NOTE — Progress Notes (Signed)
HPI male former smoker followed for OSA, insomnia, complicated by ETOH, COPD, GERD, HBP NPSG 10/31/13- AHI 52/hour, desaturation to 83%, body weight 165 pounds Unattended Home Sleep Test 09/02/2016- AHI 9.4/hour, desaturation to 87%, body weight 163 pounds Office Spirometry 01/2019-moderate obstructive airways disease.  FVC 3.6/92%, FEV1 2.0/67%, ratio 1.54, FEF 25-75% 1.9/45% -------------------------------------------------------------------------------------   01/22/2019- 74 year old male former smoker followed for OSA, insomnia, complicated by history EtOH, COPD, GERD, HBP CPAP auto 5-20/[employer] Download compliance 67% AHI 4.1/hour Body weight 169 lbs Albuterol hfa, Spiriva -----COPD,OSA wearing CPAP.No complaints. Sob same with exertion. We discussed compliance goals.  He still skips nights but control is greatly worn. Describes stable dyspnea on exertion with no acute events.  Gets meds from the [place].  No recent CXR. Office Spirometry 01/2019-moderate obstructive airways disease.  FVC 3.6/92%, FEV1 2.0/67%, ratio 1.54, FEF 25-75% 1.9/45%  01/23/20- 74 year old male former smoker followed for OSA, insomnia, complicated by history EtOH, COPD, GERD, HBP CPAP auto 5-20/[employer] Download compliance 77%, AHI 6.9/ hr Body weight today 176 lbs -----f/u COPD/CPAP. Breathing is at patient's baseline. Albuterol hfa, Trelegy 100 Using nasal pillows mask. Reviewed download. Few missed and short nights.  Denies cough, phlegm. Notes some DOE taking out trash can.- not new. Deneis chest pain, palpitation, edema. Trelegy helps- needs refill.  ROS-see HPI   + = positive Constitutional:    weight loss, night sweats, fevers, chills, +fatigue, lassitude. HEENT:    headaches, difficulty swallowing, tooth/dental problems, sore throat,       sneezing, itching, ear ache, nasal congestion, post nasal drip, snoring CV:    chest pain, orthopnea, PND, swelling in lower extremities, anasarca,                                  dizziness, palpitations Resp:   shortness of breath with exertion or at rest.                productive cough,   non-productive cough, coughing up of blood.              change in color of mucus.  wheezing.   Skin:    rash or lesions. GI:  No-   heartburn, indigestion, abdominal pain, nausea, vomiting, diarrhea,                 change in bowel habits, loss of appetite GU: dysuria, change in color of urine, no urgency or frequency.   flank pain. MS:   joint pain, stiffness, decreased range of motion, back pain. Neuro-     nothing unusual Psych:  change in mood or affect.  depression or anxiety.   memory loss.  OBJ- Physical Exam                   General- Alert, Oriented, Affect-appropriate, Distress- none acute, + not obese Skin- rash-none, lesions- none, excoriation- none Lymphadenopathy- none Head- atraumatic            Eyes- Gross vision intact, PERRLA, conjunctivae and secretions clear            Ears- Hearing, canals-normal            Nose- Clear, no-Septal dev, mucus, polyps, erosion, perforation             Throat- Mallampati III-IV , mucosa clear , drainage- none, tonsils- atrophic Neck- flexible , trachea midline, no stridor , thyroid nl, carotid no bruit Chest - symmetrical excursion ,  unlabored           Heart/CV- RRR , no murmur , no gallop  , no rub, nl s1 s2                           - JVD- none , edema- none, stasis changes- none, varices- none           Lung- clear to P&A, wheeze- none, cough- none , dullness-none, rub- none           Chest wall-  Abd-  Br/ Gen/ Rectal- Not done, not indicated Extrem- cyanosis- none, clubbing, none, atrophy- none, strength- nl Neuro- grossly intact to observation

## 2020-01-26 NOTE — Progress Notes (Signed)
Spoke with patient regarding CXR- stable mild evidence of COPD. No new or active process seen.Patient's voice was understanding. Nothing else further needed.

## 2020-02-01 ENCOUNTER — Telehealth: Payer: Self-pay | Admitting: Internal Medicine

## 2020-02-01 NOTE — Telephone Encounter (Signed)
lmtcb for pt. I do not see in pt's chart where we are still needing to speak with him. Could've been an old VM from pt's CXR results from 3/9.

## 2020-02-05 NOTE — Assessment & Plan Note (Signed)
Benefits from CPAP. Reviewed compliance goals.  Plan- continue auto 5-20

## 2020-02-05 NOTE — Assessment & Plan Note (Signed)
Stable dyspnea on exertion w/o associated symptoms. Plan refill Trelegy, CXR

## 2020-02-07 NOTE — Telephone Encounter (Signed)
LMTCB x2 for pt 

## 2020-02-07 NOTE — Telephone Encounter (Signed)
ATC patient , unable to reach, Left message to Let him know there was no pending results we could see from Dr. Maple Hudson

## 2020-02-07 NOTE — Telephone Encounter (Signed)
Pt called back. Please return call.  

## 2020-02-08 NOTE — Telephone Encounter (Signed)
LMTCB x2 for pt 

## 2020-02-09 NOTE — Telephone Encounter (Signed)
We have attempted to contact pt several times with no success or call back from pt. Per triage protocol, message will be closed.  

## 2020-03-19 DIAGNOSIS — G4733 Obstructive sleep apnea (adult) (pediatric): Secondary | ICD-10-CM | POA: Diagnosis not present

## 2020-03-27 ENCOUNTER — Encounter: Payer: Self-pay | Admitting: Internal Medicine

## 2020-03-27 ENCOUNTER — Other Ambulatory Visit: Payer: Self-pay

## 2020-03-27 ENCOUNTER — Ambulatory Visit (INDEPENDENT_AMBULATORY_CARE_PROVIDER_SITE_OTHER): Payer: PPO | Admitting: Internal Medicine

## 2020-03-27 ENCOUNTER — Other Ambulatory Visit: Payer: Self-pay | Admitting: Internal Medicine

## 2020-03-27 VITALS — BP 102/68 | HR 87 | Temp 98.0°F | Ht 71.0 in | Wt 175.0 lb

## 2020-03-27 DIAGNOSIS — Z125 Encounter for screening for malignant neoplasm of prostate: Secondary | ICD-10-CM | POA: Diagnosis not present

## 2020-03-27 DIAGNOSIS — R7302 Impaired glucose tolerance (oral): Secondary | ICD-10-CM | POA: Diagnosis not present

## 2020-03-27 DIAGNOSIS — Z0000 Encounter for general adult medical examination without abnormal findings: Secondary | ICD-10-CM

## 2020-03-27 DIAGNOSIS — E538 Deficiency of other specified B group vitamins: Secondary | ICD-10-CM | POA: Diagnosis not present

## 2020-03-27 DIAGNOSIS — E559 Vitamin D deficiency, unspecified: Secondary | ICD-10-CM

## 2020-03-27 LAB — LIPID PANEL
Cholesterol: 148 mg/dL (ref 0–200)
HDL: 37.4 mg/dL — ABNORMAL LOW (ref >39.00)
LDL Cholesterol: 79 mg/dL (ref 0–99)
NonHDL: 110.48
Total CHOL/HDL Ratio: 4
Triglycerides: 155 mg/dL — ABNORMAL HIGH (ref 0.0–149.0)
VLDL: 31 mg/dL (ref 0.0–40.0)

## 2020-03-27 LAB — URINALYSIS, ROUTINE W REFLEX MICROSCOPIC
Bilirubin Urine: NEGATIVE
Hgb urine dipstick: NEGATIVE
Ketones, ur: NEGATIVE
Leukocytes,Ua: NEGATIVE
Nitrite: NEGATIVE
RBC / HPF: NONE SEEN (ref 0–?)
Specific Gravity, Urine: 1.02 (ref 1.000–1.030)
Total Protein, Urine: NEGATIVE
Urine Glucose: NEGATIVE
Urobilinogen, UA: 0.2 (ref 0.0–1.0)
WBC, UA: NONE SEEN (ref 0–?)
pH: 6 (ref 5.0–8.0)

## 2020-03-27 LAB — BASIC METABOLIC PANEL
BUN: 19 mg/dL (ref 6–23)
CO2: 28 mEq/L (ref 19–32)
Calcium: 9.2 mg/dL (ref 8.4–10.5)
Chloride: 104 mEq/L (ref 96–112)
Creatinine, Ser: 1.1 mg/dL (ref 0.40–1.50)
GFR: 79.15 mL/min (ref >60.00)
Glucose, Bld: 91 mg/dL (ref 70–99)
Potassium: 4.7 mEq/L (ref 3.5–5.1)
Sodium: 138 mEq/L (ref 135–145)

## 2020-03-27 LAB — TSH: TSH: 0.84 u[IU]/mL (ref 0.35–4.50)

## 2020-03-27 LAB — CBC WITH DIFFERENTIAL/PLATELET
Basophils Absolute: 0.1 10*3/uL (ref 0.0–0.1)
Basophils Relative: 1 % (ref 0.0–3.0)
Eosinophils Absolute: 0.3 10*3/uL (ref 0.0–0.7)
Eosinophils Relative: 2.4 % (ref 0.0–5.0)
HCT: 44.8 % (ref 39.0–52.0)
Hemoglobin: 15.1 g/dL (ref 13.0–17.0)
Lymphocytes Relative: 13.6 % (ref 12.0–46.0)
Lymphs Abs: 1.9 10*3/uL (ref 0.7–4.0)
MCHC: 33.7 g/dL (ref 30.0–36.0)
MCV: 98 fl (ref 78.0–100.0)
Monocytes Absolute: 1.1 10*3/uL — ABNORMAL HIGH (ref 0.1–1.0)
Monocytes Relative: 7.7 % (ref 3.0–12.0)
Neutro Abs: 10.6 10*3/uL — ABNORMAL HIGH (ref 1.4–7.7)
Neutrophils Relative %: 75.3 % (ref 43.0–77.0)
Platelets: 274 10*3/uL (ref 150.0–400.0)
RBC: 4.57 Mil/uL (ref 4.22–5.81)
RDW: 13.3 % (ref 11.5–15.5)
WBC: 14 10*3/uL — ABNORMAL HIGH (ref 4.0–10.5)

## 2020-03-27 LAB — HEPATIC FUNCTION PANEL
ALT: 28 U/L (ref 0–53)
AST: 20 U/L (ref 0–37)
Albumin: 4.3 g/dL (ref 3.5–5.2)
Alkaline Phosphatase: 75 U/L (ref 39–117)
Bilirubin, Direct: 0.1 mg/dL (ref 0.0–0.3)
Total Bilirubin: 0.5 mg/dL (ref 0.2–1.2)
Total Protein: 7 g/dL (ref 6.0–8.3)

## 2020-03-27 LAB — VITAMIN D 25 HYDROXY (VIT D DEFICIENCY, FRACTURES): VITD: 26.05 ng/mL — ABNORMAL LOW (ref 30.00–100.00)

## 2020-03-27 LAB — HEMOGLOBIN A1C: Hgb A1c MFr Bld: 5.9 % (ref 4.6–6.5)

## 2020-03-27 LAB — VITAMIN B12: Vitamin B-12: 321 pg/mL (ref 211–911)

## 2020-03-27 LAB — PSA: PSA: 1 ng/mL (ref 0.10–4.00)

## 2020-03-27 MED ORDER — VITAMIN D (ERGOCALCIFEROL) 1.25 MG (50000 UNIT) PO CAPS: 50000.0000 [IU] | ORAL_CAPSULE | ORAL | 0 refills | Status: DC

## 2020-03-27 NOTE — Progress Notes (Signed)
Subjective:    Patient ID: Ryan Diaz, male    DOB: [birth year], 74 y.o.   MRN: [id number]  HPI  Here for wellness and f/u;  Overall doing ok;  Pt denies Chest pain, worsening SOB, DOE, wheezing, orthopnea, PND, worsening LE edema, palpitations, dizziness or syncope.  Pt denies neurological change such as new headache, facial or extremity weakness.  Pt denies polydipsia, polyuria, or low sugar symptoms. Pt states overall good compliance with treatment and medications, good tolerability, and has been trying to follow appropriate diet.  Pt denies worsening depressive symptoms, suicidal ideation or panic. No fever, night sweats, wt loss, loss of appetite, or other constitutional symptoms.  Pt states good ability with ADL's, has low fall risk, home safety reviewed and adequate, no other significant changes in hearing or vision, and only occasionally active with exercise. No new complaints Past Medical History:  Diagnosis Date  . 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 ALLERGIC RHINITIS 08/08/2007  . ANXIETY 08/08/2007  . Carpal tunnel syndrome   . COPD 10/03/2009  . DEPRESSION 08/08/2007  . ERECTILE DYSFUNCTION 08/08/2007  . GENITAL HERPES, HX OF 08/08/2007  . GERD 08/08/2007  . GLUCOSE INTOLERANCE 08/08/2007  . HYPERLIPIDEMIA 08/08/2007  . HYPERTENSION 08/08/2007  . Impaired glucose tolerance 09/26/2011  . Insomnia 05/18/2012  . PAIN IN SOFT TISSUES OF LIMB 10/02/2009  . Sleep apnea    OSA-uses CPAP nightly   Past Surgical History:  Procedure Laterality Date  . ACHILLES TENDON REPAIR Bilateral   . CARPAL TUNNEL RELEASE Right 08/18/2018   Procedure: RIGHT CARPAL TUNNEL RELEASE;  Surgeon: Betha Loa, MD;  Location: [facility];  Service: Orthopedics;  Laterality: Right;  . CARPAL TUNNEL RELEASE Left 11/21/2018   Procedure: LEFT CARPAL TUNNEL RELEASE;  Surgeon: Betha Loa, MD;  Location:  [facility];  Service: Orthopedics;  Laterality: Left;  Bier block  . COLONOSCOPY       reports that he quit smoking about 10 years ago. His smoking use included cigarettes. He has a 52.50 pack-year smoking history. He has never used smokeless tobacco. He reports current alcohol use of about 46.0 - 53.0 standard drinks of alcohol per week. He reports current drug use. Drug: Marijuana. family history includes Colon polyps in his brother. No Known Allergies Current Outpatient Medications on File Prior to Visit  Medication Sig Dispense Refill  . albuterol (PROVENTIL HFA;VENTOLIN HFA) 108 (90 BASE) MCG/ACT inhaler Inhale 2 puffs into the lungs every 6 (six) hours as needed for wheezing or shortness of breath.    Marland Kitchen aspirin EC 81 MG tablet Take 81 mg by mouth daily.    Marland Kitchen atorvastatin (LIPITOR) 80 MG tablet Take 1 tablet (80 mg total) by mouth daily. 90 tablet 3  . cetirizine (ZYRTEC) 10 MG tablet Take 1 tablet (10 mg total) by mouth daily. 30 tablet 11  . diclofenac sodium (VOLTAREN) 1 % GEL Apply 2 g topically 4 (four) times daily as needed. 100 g 5  . donepezil (ARICEPT) 5 MG tablet Take 1 tablet (5 mg total) by mouth at bedtime. 90 tablet 5  . fluocinonide ointment (LIDEX) 0.05 % Apply 1 application topically 2 (two) times daily. 30 g 2  . Fluticasone-Umeclidin-Vilant (TRELEGY ELLIPTA) 100-62.5-25 MCG/INH AEPB Use as directed 1 puff in the mouth or throat daily. 60 each 12  . gabapentin (NEURONTIN) 100 MG capsule Take 1 capsule (100 mg total) by mouth 3 (three) times daily. 90 capsule 5  . ipratropium (ATROVENT) 0.06 % nasal  spray Place 2 sprays into both nostrils 3 (three) times daily. 15 mL 5  . lisinopril (ZESTRIL) 20 MG tablet Take 1 tablet (20 mg total) by mouth daily. Annual appt due in MAY must see provider for future refills 90 tablet 0  . Omega-3 Fatty Acids (FISH OIL) 1000 MG CAPS Take 1,000 mg by mouth 2 (two) times daily.    Marland Kitchen omeprazole (PRILOSEC) 20 MG capsule Take 1 capsule (20 mg total) by mouth 2 (two) times daily. 90 capsule 3  . terazosin (HYTRIN) 2 MG capsule  Take 1 capsule (2 mg total) by mouth at bedtime. 90 capsule 3   No current facility-administered medications on file prior to visit.   Review of Systems All otherwise neg per pt     Objective:   Physical Exam BP 102/68 (BP Location: Left Arm, Patient Position: Sitting, Cuff Size: Large)   Pulse 87   Temp 98 F (36.7 C) (Oral)   Ht 5\' 11"  (1.803 m)   Wt 175 lb (79.4 kg)   SpO2 95%   BMI 24.41 kg/m  VS noted,  Constitutional: Pt appears in NAD HENT: Head: NCAT.  Right Ear: External ear normal.  Left Ear: External ear normal.  Eyes: . Pupils are equal, round, and reactive to light. Conjunctivae and EOM are normal Nose: without d/c or deformity Neck: Neck supple. Gross normal ROM Cardiovascular: Normal rate and regular rhythm.   Pulmonary/Chest: Effort normal and breath sounds without rales or wheezing.  Abd:  Soft, NT, ND, + BS, no organomegaly Neurological: Pt is alert. At baseline orientation, motor grossly intact Skin: Skin is warm. No rashes, other new lesions, no LE edema Psychiatric: Pt behavior is normal without agitation  All otherwise neg per pt Lab Results  Component Value Date   WBC 14.0 (H) 03/27/2020   HGB 15.1 03/27/2020   HCT 44.8 03/27/2020   PLT 274.0 03/27/2020   GLUCOSE 91 03/27/2020   CHOL 148 03/27/2020   TRIG 155.0 (H) 03/27/2020   HDL 37.40 (L) 03/27/2020   LDLDIRECT 83.0 10/19/2018   LDLCALC 79 03/27/2020   ALT 28 03/27/2020   AST 20 03/27/2020   NA 138 03/27/2020   K 4.7 03/27/2020   CL 104 03/27/2020   CREATININE 1.10 03/27/2020   BUN 19 03/27/2020   CO2 28 03/27/2020   TSH 0.84 03/27/2020   PSA 1.00 03/27/2020   HGBA1C 5.9 03/27/2020      Assessment & Plan:

## 2020-03-27 NOTE — Assessment & Plan Note (Signed)
stable overall by history and exam, recent data reviewed with pt, and pt to continue medical treatment as before,  to f/u any worsening symptoms or concerns  

## 2020-03-27 NOTE — Assessment & Plan Note (Signed)

## 2020-03-27 NOTE — Patient Instructions (Signed)

## 2020-04-25 DIAGNOSIS — D692 Other nonthrombocytopenic purpura: Secondary | ICD-10-CM | POA: Diagnosis not present

## 2020-05-28 ENCOUNTER — Telehealth: Payer: Self-pay

## 2020-05-29 ENCOUNTER — Encounter: Payer: Self-pay | Admitting: Internal Medicine

## 2020-05-29 ENCOUNTER — Ambulatory Visit (INDEPENDENT_AMBULATORY_CARE_PROVIDER_SITE_OTHER): Payer: PPO | Admitting: Internal Medicine

## 2020-05-29 ENCOUNTER — Other Ambulatory Visit: Payer: Self-pay

## 2020-05-29 VITALS — BP 100/66 | HR 87 | Temp 98.7°F | Ht 71.0 in | Wt 171.0 lb

## 2020-05-29 DIAGNOSIS — R42 Dizziness and giddiness: Secondary | ICD-10-CM | POA: Diagnosis not present

## 2020-05-29 DIAGNOSIS — R7302 Impaired glucose tolerance (oral): Secondary | ICD-10-CM

## 2020-05-29 DIAGNOSIS — E559 Vitamin D deficiency, unspecified: Secondary | ICD-10-CM | POA: Diagnosis not present

## 2020-05-29 DIAGNOSIS — I10 Essential (primary) hypertension: Secondary | ICD-10-CM | POA: Diagnosis not present

## 2020-05-29 NOTE — Progress Notes (Signed)
Subjective:    Patient ID: Ryan Diaz, male    DOB: 25-Aug-1946, 74 y.o.   MRN: [id number]  HPI    Here to f/u; overall doing ok,  Pt denies chest pain, increasing sob or doe, wheezing, orthopnea, PND, increased LE swelling, palpitations, or syncope but has ongoing dizziness often with standing in the past wek  Pt denies new neurological symptoms such as new headache, or facial or extremity weakness or numbness.  Pt denies polydipsia, polyuria, or low sugar episode.  Pt states overall good compliance with meds, mostly trying to follow appropriate diet, with wt overall stable,  but little exercise however.\ Wt Readings from Last 3 Encounters:  05/29/20 171 lb (77.6 kg)  03/27/20 175 lb (79.4 kg)  01/23/20 176 lb 3.2 oz (79.9 kg)   Past Medical History:  Diagnosis Date  . 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 ALLERGIC RHINITIS 08/08/2007  . ANXIETY 08/08/2007  . Carpal tunnel syndrome   . COPD 10/03/2009  . DEPRESSION 08/08/2007  . ERECTILE DYSFUNCTION 08/08/2007  . GENITAL HERPES, HX OF 08/08/2007  . GERD 08/08/2007  . GLUCOSE INTOLERANCE 08/08/2007  . HYPERLIPIDEMIA 08/08/2007  . HYPERTENSION 08/08/2007  . Impaired glucose tolerance 09/26/2011  . Insomnia 05/18/2012  . PAIN IN SOFT TISSUES OF LIMB 10/02/2009  . Sleep apnea    OSA-uses CPAP nightly   Past Surgical History:  Procedure Laterality Date  . ACHILLES TENDON REPAIR Bilateral   . CARPAL TUNNEL RELEASE Right 08/18/2018   Procedure: RIGHT CARPAL TUNNEL RELEASE;  Surgeon: Betha Loa, MD;  Location: [facility];  Service: Orthopedics;  Laterality: Right;  . CARPAL TUNNEL RELEASE Left 11/21/2018   Procedure: LEFT CARPAL TUNNEL RELEASE;  Surgeon: Betha Loa, MD;  Location: [facility];  Service: Orthopedics;  Laterality: Left;  Bier block  . COLONOSCOPY      reports that he quit smoking about 10 years ago. His smoking use included cigarettes. He has a 52.50 pack-year smoking history. He has never used  smokeless tobacco. He reports current alcohol use of about 46.0 - 53.0 standard drinks of alcohol per week. He reports current drug use. Drug: Marijuana. family history includes Colon polyps in his brother. No Known Allergies Current Outpatient Medications on File Prior to Visit  Medication Sig Dispense Refill  . albuterol (PROVENTIL HFA;VENTOLIN HFA) 108 (90 BASE) MCG/ACT inhaler Inhale 2 puffs into the lungs every 6 (six) hours as needed for wheezing or shortness of breath.    Marland Kitchen aspirin EC 81 MG tablet Take 81 mg by mouth daily.    Marland Kitchen atorvastatin (LIPITOR) 80 MG tablet Take 1 tablet (80 mg total) by mouth daily. 90 tablet 3  . cetirizine (ZYRTEC) 10 MG tablet Take 1 tablet (10 mg total) by mouth daily. 30 tablet 11  . diclofenac sodium (VOLTAREN) 1 % GEL Apply 2 g topically 4 (four) times daily as needed. 100 g 5  . donepezil (ARICEPT) 5 MG tablet Take 1 tablet (5 mg total) by mouth at bedtime. 90 tablet 5  . fluocinonide ointment (LIDEX) 0.05 % Apply 1 application topically 2 (two) times daily. 30 g 2  . fluticasone (CUTIVATE) 0.05 % cream SMARTSIG:Topical 1 to 2 Times Daily PRN    . Fluticasone-Umeclidin-Vilant (TRELEGY ELLIPTA) 100-62.5-25 MCG/INH AEPB Use as directed 1 puff in the mouth or throat daily. 60 each 12  . gabapentin (NEURONTIN) 100 MG capsule Take 1 capsule (100 mg total) by mouth 3 (three) times daily. 90 capsule 5  .  ipratropium (ATROVENT) 0.06 % nasal spray Place 2 sprays into both nostrils 3 (three) times daily. 15 mL 5  . lisinopril (ZESTRIL) 20 MG tablet Take 1 tablet (20 mg total) by mouth daily. Annual appt due in MAY must see provider for future refills 90 tablet 0  . Omega-3 Fatty Acids (FISH OIL) 1000 MG CAPS Take 1,000 mg by mouth 2 (two) times daily.    Marland Kitchen omeprazole (PRILOSEC) 20 MG capsule Take 1 capsule (20 mg total) by mouth 2 (two) times daily. 90 capsule 3  . terazosin (HYTRIN) 2 MG capsule Take 1 capsule (2 mg total) by mouth at bedtime. 90 capsule 3  .  Vitamin D, Ergocalciferol, (DRISDOL) 1.25 MG (50000 UNIT) CAPS capsule Take 1 capsule (50,000 Units total) by mouth every 7 (seven) days. 12 capsule 0   No current facility-administered medications on file prior to visit.   Review of Systems All otherwise neg per pt    Objective:   Physical Exam BP 100/66 (BP Location: Right Arm, Patient Position: Standing, Cuff Size: Normal)   Pulse 87   Temp 98.7 F (37.1 C) (Oral)   Ht 5\' 11"  (1.803 m)   Wt 171 lb (77.6 kg)   SpO2 96%   BMI 23.85 kg/m  VS noted,  Constitutional: Pt appears in NAD HENT: Head: NCAT.  Right Ear: External ear normal.  Left Ear: External ear normal.  Eyes: . Pupils are equal, round, and reactive to light. Conjunctivae and EOM are normal Nose: without d/c or deformity Neck: Neck supple. Gross normal ROM Cardiovascular: Normal rate and regular rhythm.   Pulmonary/Chest: Effort normal and breath sounds without rales or wheezing.  Abd:  Soft, NT, ND, + BS, no organomegaly Neurological: Pt is alert. At baseline orientation, motor grossly intact Skin: Skin is warm. No rashes, other new lesions, no LE edema Psychiatric: Pt behavior is normal without agitation  All otherwise neg per pt Lab Results  Component Value Date   WBC 14.0 (H) 03/27/2020   HGB 15.1 03/27/2020   HCT 44.8 03/27/2020   PLT 274.0 03/27/2020   GLUCOSE 91 03/27/2020   CHOL 148 03/27/2020   TRIG 155.0 (H) 03/27/2020   HDL 37.40 (L) 03/27/2020   LDLDIRECT 83.0 10/19/2018   LDLCALC 79 03/27/2020   ALT 28 03/27/2020   AST 20 03/27/2020   NA 138 03/27/2020   K 4.7 03/27/2020   CL 104 03/27/2020   CREATININE 1.10 03/27/2020   BUN 19 03/27/2020   CO2 28 03/27/2020   TSH 0.84 03/27/2020   PSA 1.00 03/27/2020   HGBA1C 5.9 03/27/2020      Assessment & Plan:

## 2020-05-29 NOTE — Patient Instructions (Signed)
Please change to OTC Vitamin D3 at 2000 units per day, indefinitely, after the weekly Vitamin D is done  [facility] to hold on taking the lisinopril due to lower BP I think may be related to lower weight  Please continue all other medications as before, and refills have been done if requested.  Please have the pharmacy call with any other refills you may need.  Please continue your efforts at being more active, low cholesterol diet  Please keep your appointments with your specialists as you may have planned  Please check the BP starting when you get back next Monday from the trip, then see up in the office in 1 week   Please bring your BP kit with you to the next appt

## 2020-06-01 ENCOUNTER — Encounter: Payer: Self-pay | Admitting: Internal Medicine

## 2020-06-01 DIAGNOSIS — E559 Vitamin D deficiency, unspecified: Secondary | ICD-10-CM | POA: Insufficient documentation

## 2020-06-01 NOTE — Assessment & Plan Note (Addendum)
likley due to overcontolled bp - to d/c lisinoprril  I spent 31 minutes in preparing to see the patient by review of recent labs, imaging and procedures, obtaining and reviewing separately obtained history, communicating with the patient and family or caregiver, ordering medications, tests or procedures, and documenting clinical information in the EHR including the differential Dx, treatment, and any further evaluation and other management of dizziness, htn, hyperglycemia, vit d def

## 2020-06-01 NOTE — Assessment & Plan Note (Signed)
For oral replacement 

## 2020-06-01 NOTE — Assessment & Plan Note (Signed)
As above, cont all other tx,  to f/u any worsening symptoms or concerns

## 2020-06-01 NOTE — Assessment & Plan Note (Signed)
stable overall by history and exam, recent data reviewed with pt, and pt to continue medical treatment as before,  to f/u any worsening symptoms or concerns  

## 2020-06-11 ENCOUNTER — Ambulatory Visit (INDEPENDENT_AMBULATORY_CARE_PROVIDER_SITE_OTHER): Payer: PPO | Admitting: Internal Medicine

## 2020-06-11 ENCOUNTER — Encounter: Payer: Self-pay | Admitting: Internal Medicine

## 2020-06-11 ENCOUNTER — Other Ambulatory Visit: Payer: Self-pay

## 2020-06-11 DIAGNOSIS — R7302 Impaired glucose tolerance (oral): Secondary | ICD-10-CM

## 2020-06-11 DIAGNOSIS — J449 Chronic obstructive pulmonary disease, unspecified: Secondary | ICD-10-CM

## 2020-06-11 DIAGNOSIS — E559 Vitamin D deficiency, unspecified: Secondary | ICD-10-CM | POA: Diagnosis not present

## 2020-06-11 DIAGNOSIS — I10 Essential (primary) hypertension: Secondary | ICD-10-CM | POA: Diagnosis not present

## 2020-06-11 NOTE — Patient Instructions (Signed)
Ok to hold on taking any further lisinopril or other new medication for blood pressure  Please obtain a new BP machine from the [place]  Please continue all other medications as before, and refills have been done if requested.  Please have the pharmacy call with any other refills you may need.  Please continue your efforts at being more active, low cholesterol diet, and weight control.  Please keep your appointments with your specialists as you may have planned  You are given the form signed for the handicapped parking application  Please make an Appointment to return in 6 months, or sooner if needed

## 2020-06-11 NOTE — Progress Notes (Signed)
Subjective:    Patient ID: Ryan Diaz, male    DOB: Oct 29, 1946, 74 y.o.   MRN: [id number]  HPI  Here to f/u; overall doing ok,  Pt denies chest pain, increasing sob or doe, wheezing, orthopnea, PND, increased LE swelling, palpitations, dizziness or syncope.  Pt denies new neurological symptoms such as new headache, or facial or extremity weakness or numbness.  Pt denies polydipsia, polyuria, or low sugar episode.  Pt states overall good compliance with meds, mostly trying to follow appropriate diet, with wt overall stable,  but little exercise however. Dizziness resolved off recent BP med   Pt now realizes BP machine at home runs 20-30 pts high, and plans to get new one from [place] Past Medical History:  Diagnosis Date  . 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 ALLERGIC RHINITIS 08/08/2007  . ANXIETY 08/08/2007  . Carpal tunnel syndrome   . COPD 10/03/2009  . DEPRESSION 08/08/2007  . ERECTILE DYSFUNCTION 08/08/2007  . GENITAL HERPES, HX OF 08/08/2007  . GERD 08/08/2007  . GLUCOSE INTOLERANCE 08/08/2007  . HYPERLIPIDEMIA 08/08/2007  . HYPERTENSION 08/08/2007  . Impaired glucose tolerance 09/26/2011  . Insomnia 05/18/2012  . PAIN IN SOFT TISSUES OF LIMB 10/02/2009  . Sleep apnea    OSA-uses CPAP nightly   Past Surgical History:  Procedure Laterality Date  . ACHILLES TENDON REPAIR Bilateral   . CARPAL TUNNEL RELEASE Right 08/18/2018   Procedure: RIGHT CARPAL TUNNEL RELEASE;  Surgeon: Betha Loa, MD;  Location: [facility];  Service: Orthopedics;  Laterality: Right;  . CARPAL TUNNEL RELEASE Left 11/21/2018   Procedure: LEFT CARPAL TUNNEL RELEASE;  Surgeon: Betha Loa, MD;  Location: [facility];  Service: Orthopedics;  Laterality: Left;  Bier block  . COLONOSCOPY      reports that he quit smoking about 10 years ago. His smoking use included cigarettes. He has a 52.50 pack-year smoking history. He has never used smokeless tobacco. He reports current alcohol use of  about 46.0 - 53.0 standard drinks of alcohol per week. He reports current drug use. Drug: Marijuana. family history includes Colon polyps in his brother. No Known Allergies Current Outpatient Medications on File Prior to Visit  Medication Sig Dispense Refill  . albuterol (PROVENTIL HFA;VENTOLIN HFA) 108 (90 BASE) MCG/ACT inhaler Inhale 2 puffs into the lungs every 6 (six) hours as needed for wheezing or shortness of breath.    Marland Kitchen aspirin EC 81 MG tablet Take 81 mg by mouth daily.    Marland Kitchen atorvastatin (LIPITOR) 80 MG tablet Take 1 tablet (80 mg total) by mouth daily. 90 tablet 3  . cetirizine (ZYRTEC) 10 MG tablet Take 1 tablet (10 mg total) by mouth daily. 30 tablet 11  . diclofenac sodium (VOLTAREN) 1 % GEL Apply 2 g topically 4 (four) times daily as needed. 100 g 5  . donepezil (ARICEPT) 5 MG tablet Take 1 tablet (5 mg total) by mouth at bedtime. 90 tablet 5  . fluocinonide ointment (LIDEX) 0.05 % Apply 1 application topically 2 (two) times daily. 30 g 2  . fluticasone (CUTIVATE) 0.05 % cream SMARTSIG:Topical 1 to 2 Times Daily PRN    . Fluticasone-Umeclidin-Vilant (TRELEGY ELLIPTA) 100-62.5-25 MCG/INH AEPB Use as directed 1 puff in the mouth or throat daily. 60 each 12  . gabapentin (NEURONTIN) 100 MG capsule Take 1 capsule (100 mg total) by mouth 3 (three) times daily. 90 capsule 5  . ipratropium (ATROVENT) 0.06 % nasal spray Place 2 sprays into both nostrils  3 (three) times daily. 15 mL 5  . Omega-3 Fatty Acids (FISH OIL) 1000 MG CAPS Take 1,000 mg by mouth 2 (two) times daily.    Marland Kitchen omeprazole (PRILOSEC) 20 MG capsule Take 1 capsule (20 mg total) by mouth 2 (two) times daily. 90 capsule 3  . terazosin (HYTRIN) 2 MG capsule Take 1 capsule (2 mg total) by mouth at bedtime. 90 capsule 3  . Vitamin D, Ergocalciferol, (DRISDOL) 1.25 MG (50000 UNIT) CAPS capsule Take 1 capsule (50,000 Units total) by mouth every 7 (seven) days. 12 capsule 0   No current facility-administered medications on file prior  to visit.   Review of Systems All otherwise neg per pt     Objective:   Physical Exam BP 118/78 (BP Location: Left Arm, Patient Position: Sitting, Cuff Size: Large)   Pulse 79   Temp 97.9 F (36.6 C) (Oral)   Ht 5\' 11"  (1.803 m)   Wt 172 lb (78 kg)   SpO2 96%   BMI 23.99 kg/m  VS noted,  Constitutional: Pt appears in NAD HENT: Head: NCAT.  Right Ear: External ear normal.  Left Ear: External ear normal.  Eyes: . Pupils are equal, round, and reactive to light. Conjunctivae and EOM are normal Nose: without d/c or deformity Neck: Neck supple. Gross normal ROM Cardiovascular: Normal rate and regular rhythm.   Pulmonary/Chest: Effort normal and breath sounds without rales or wheezing.  Neurological: Pt is alert. At baseline orientation, motor grossly intact Skin: Skin is warm. No rashes, other new lesions, no LE edema Psychiatric: Pt behavior is normal without agitation  All otherwise neg per pt Lab Results  Component Value Date   WBC 14.0 (H) 03/27/2020   HGB 15.1 03/27/2020   HCT 44.8 03/27/2020   PLT 274.0 03/27/2020   GLUCOSE 91 03/27/2020   CHOL 148 03/27/2020   TRIG 155.0 (H) 03/27/2020   HDL 37.40 (L) 03/27/2020   LDLDIRECT 83.0 10/19/2018   LDLCALC 79 03/27/2020   ALT 28 03/27/2020   AST 20 03/27/2020   NA 138 03/27/2020   K 4.7 03/27/2020   CL 104 03/27/2020   CREATININE 1.10 03/27/2020   BUN 19 03/27/2020   CO2 28 03/27/2020   TSH 0.84 03/27/2020   PSA 1.00 03/27/2020   HGBA1C 5.9 03/27/2020      Assessment & Plan:

## 2020-06-16 ENCOUNTER — Encounter: Payer: Self-pay | Admitting: Internal Medicine

## 2020-06-16 NOTE — Assessment & Plan Note (Signed)
stable overall by history and exam, recent data reviewed with pt, and pt to continue medical treatment as before,  to f/u any worsening symptoms or concerns  

## 2020-06-16 NOTE — Assessment & Plan Note (Addendum)
Now stable overall by history and exam, recent data reviewed with pt, and pt to continue medical treatment as before,  to f/u any worsening symptoms or concerns  I spent 31 minutes in preparing to see the patient by review of recent labs, imaging and procedures, obtaining and reviewing separately obtained history, communicating with the patient and family or caregiver, ordering medications, tests or procedures, and documenting clinical information in the EHR including the differential Dx, treatment, and any further evaluation and other management of htn, hyperglycemia, vit d def, copd

## 2020-06-16 NOTE — Assessment & Plan Note (Signed)
Cont oral replacement 

## 2020-07-19 DIAGNOSIS — L218 Other seborrheic dermatitis: Secondary | ICD-10-CM | POA: Diagnosis not present

## 2020-07-19 DIAGNOSIS — L308 Other specified dermatitis: Secondary | ICD-10-CM | POA: Diagnosis not present

## 2020-07-25 ENCOUNTER — Ambulatory Visit: Payer: PPO | Admitting: Internal Medicine

## 2020-07-26 ENCOUNTER — Ambulatory Visit (INDEPENDENT_AMBULATORY_CARE_PROVIDER_SITE_OTHER): Payer: PPO

## 2020-07-26 ENCOUNTER — Encounter: Payer: Self-pay | Admitting: Internal Medicine

## 2020-07-26 ENCOUNTER — Ambulatory Visit (INDEPENDENT_AMBULATORY_CARE_PROVIDER_SITE_OTHER): Payer: PPO | Admitting: Internal Medicine

## 2020-07-26 ENCOUNTER — Other Ambulatory Visit: Payer: Self-pay

## 2020-07-26 VITALS — BP 128/70 | HR 86 | Temp 98.1°F | Resp 16 | Ht 71.0 in | Wt 169.8 lb

## 2020-07-26 VITALS — BP 128/72 | HR 86 | Temp 98.1°F | Ht 71.0 in | Wt 169.5 lb

## 2020-07-26 DIAGNOSIS — J449 Chronic obstructive pulmonary disease, unspecified: Secondary | ICD-10-CM

## 2020-07-26 DIAGNOSIS — I10 Essential (primary) hypertension: Secondary | ICD-10-CM | POA: Diagnosis not present

## 2020-07-26 DIAGNOSIS — Z23 Encounter for immunization: Secondary | ICD-10-CM | POA: Diagnosis not present

## 2020-07-26 DIAGNOSIS — F32A Depression, unspecified: Secondary | ICD-10-CM

## 2020-07-26 DIAGNOSIS — R7302 Impaired glucose tolerance (oral): Secondary | ICD-10-CM | POA: Diagnosis not present

## 2020-07-26 DIAGNOSIS — F329 Major depressive disorder, single episode, unspecified: Secondary | ICD-10-CM

## 2020-07-26 DIAGNOSIS — M25511 Pain in right shoulder: Secondary | ICD-10-CM

## 2020-07-26 DIAGNOSIS — E559 Vitamin D deficiency, unspecified: Secondary | ICD-10-CM | POA: Diagnosis not present

## 2020-07-26 DIAGNOSIS — Z0000 Encounter for general adult medical examination without abnormal findings: Secondary | ICD-10-CM

## 2020-07-26 MED ORDER — MELOXICAM 15 MG PO TABS: ORAL_TABLET | ORAL | 1 refills | Status: AC

## 2020-07-26 NOTE — Patient Instructions (Addendum)
Please take OTC Vitamin D3 at 2000 units per day, indefinitely.  You had the flu shot today  Please take all new medication as prescribed - the meloxicam as an anti-inflammatory for the shoulder pain  Please continue all other medications as before  Please have the pharmacy call with any other refills you may need.  Please continue your efforts at being more active, low cholesterol diet, and weight control.  Please keep your appointments with your specialists as you may have planned  You will be contacted regarding the referral for: sports medicine, although you could make an appt as you leave as well  Please go to the XRAY Department in the first floor for the x-ray testing for the right shoulder  You will be contacted by phone if any changes need to be made immediately.  Otherwise, you will receive a letter about your results with an explanation, but please check with MyChart first.  Please remember to sign up for MyChart if you have not done so, as this will be important to you in the future with finding out test results, communicating by private email, and scheduling acute appointments online when needed.  Please make an Appointment to return in 6 months, or sooner if needed

## 2020-07-26 NOTE — Progress Notes (Signed)
Subjective:   ALAZAR CHERIAN is a 74 y.o. male who presents for Medicare Annual/Subsequent preventive examination.  Review of Systems    NO ROS. Medicare Wellness Visit. Cardiac Risk Factors include: advanced age (>63men, >108 women);dyslipidemia;hypertension;male gender     Objective:    Today's Vitals   07/26/20 1300  BP: 128/70  Pulse: 86  Resp: 16  Temp: 98.1 F (36.7 C)  SpO2: 95%  Weight: 169 lb 12.8 oz (77 kg)  Height: 5\' 11"  (1.803 m)  PainSc: 0-No pain   Body mass index is 23.68 kg/m.  Advanced Directives 07/26/2020 02/01/2019 11/07/2018 08/18/2018 08/15/2018 05/21/2017 12/18/2016  Does Patient Have a Medical Advance Directive? No No No No No No No  Would patient like information on creating a medical advance directive? Yes (MAU/Ambulatory/Procedural Areas - Information given) Yes (ED - Information included in AVS) No - Patient declined No - Patient declined No - Patient declined Yes (ED - Information included in AVS) Yes (MAU/Ambulatory/Procedural Areas - Information given)    Current Medications (verified) Outpatient Encounter Medications as of 07/26/2020  Medication Sig  . albuterol (PROVENTIL HFA;VENTOLIN HFA) 108 (90 BASE) MCG/ACT inhaler Inhale 2 puffs into the lungs every 6 (six) hours as needed for wheezing or shortness of breath.  09/25/2020 aspirin EC 81 MG tablet Take 81 mg by mouth daily.  Marland Kitchen atorvastatin (LIPITOR) 80 MG tablet Take 1 tablet (80 mg total) by mouth daily.  . cetirizine (ZYRTEC) 10 MG tablet Take 1 tablet (10 mg total) by mouth daily.  . diclofenac sodium (VOLTAREN) 1 % GEL Apply 2 g topically 4 (four) times daily as needed.  . donepezil (ARICEPT) 5 MG tablet Take 1 tablet (5 mg total) by mouth at bedtime.  . fluocinonide ointment (LIDEX) 0.05 % Apply 1 application topically 2 (two) times daily.  . fluticasone (CUTIVATE) 0.05 % cream SMARTSIG:Topical 1 to 2 Times Daily PRN  . Fluticasone-Umeclidin-Vilant (TRELEGY ELLIPTA) 100-62.5-25 MCG/INH AEPB Use as  directed 1 puff in the mouth or throat daily.  12-15-1985 gabapentin (NEURONTIN) 100 MG capsule Take 1 capsule (100 mg total) by mouth 3 (three) times daily.  Marland Kitchen ipratropium (ATROVENT) 0.06 % nasal spray Place 2 sprays into both nostrils 3 (three) times daily.  . Omega-3 Fatty Acids (FISH OIL) 1000 MG CAPS Take 1,000 mg by mouth 2 (two) times daily.  Marland Kitchen omeprazole (PRILOSEC) 20 MG capsule Take 1 capsule (20 mg total) by mouth 2 (two) times daily.  Marland Kitchen terazosin (HYTRIN) 2 MG capsule Take 1 capsule (2 mg total) by mouth at bedtime.  . [DISCONTINUED] Vitamin D, Ergocalciferol, (DRISDOL) 1.25 MG (50000 UNIT) CAPS capsule Take 1 capsule (50,000 Units total) by mouth every 7 (seven) days.   No facility-administered encounter medications on file as of 07/26/2020.    Allergies (verified) Patient has no known allergies.   History: Past Medical History:  Diagnosis Date  . 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 ALLERGIC RHINITIS 08/08/2007  . ANXIETY 08/08/2007  . Carpal tunnel syndrome   . COPD 10/03/2009  . DEPRESSION 08/08/2007  . ERECTILE DYSFUNCTION 08/08/2007  . GENITAL HERPES, HX OF 08/08/2007  . GERD 08/08/2007  . GLUCOSE INTOLERANCE 08/08/2007  . HYPERLIPIDEMIA 08/08/2007  . HYPERTENSION 08/08/2007  . Impaired glucose tolerance 09/26/2011  . Insomnia 05/18/2012  . PAIN IN SOFT TISSUES OF LIMB 10/02/2009  . Sleep apnea    OSA-uses CPAP nightly   Past Surgical History:  Procedure Laterality Date  . ACHILLES TENDON REPAIR Bilateral   . CARPAL  TUNNEL RELEASE Right 08/18/2018   Procedure: RIGHT CARPAL TUNNEL RELEASE;  Surgeon: Betha LoaKuzma, Kevin, MD;  Location: [facility];  Service: Orthopedics;  Laterality: Right;  . CARPAL TUNNEL RELEASE Left 11/21/2018   Procedure: LEFT CARPAL TUNNEL RELEASE;  Surgeon: Betha LoaKuzma, Kevin, MD;  Location: [facility];  Service: Orthopedics;  Laterality: Left;  Bier block  . COLONOSCOPY     Family History  Problem Relation Age of Onset  . Colon polyps  Brother    Social History   Socioeconomic History  . Marital status: Single    Spouse name: Not on file  . Number of children: Not on file  . Years of education: Not on file  . Highest education level: Not on file  Occupational History  . Occupation: self employed Airline pilotauto body work  Tobacco Use  . Smoking status: Former Smoker    Packs/day: 1.50    Years: 35.00    Pack years: 52.50    Types: Cigarettes    Quit date: 09/04/2009    Years since quitting: 10.8  . Smokeless tobacco: Never Used  Vaping Use  . Vaping Use: Never used  Substance and Sexual Activity  . Alcohol use: Yes    Alcohol/week: 46.0 - 53.0 standard drinks    Types: 28 - 35 Cans of beer, 18 Standard drinks or equivalent per week    Comment: everyday drinker amount varies   . Drug use: Yes    Types: Marijuana    Comment: marijuana occassionally  . Sexual activity: Yes  Other Topics Concern  . Not on file  Social History Narrative  . Not on file   Social Determinants of Health   Financial Resource Strain: Low Risk   . Difficulty of Paying Living Expenses: Not hard at all  Food Insecurity: No Food Insecurity  . Worried About Programme researcher, broadcasting/film/videounning Out of Food in the Last Year: Never true  . Ran Out of Food in the Last Year: Never true  Transportation Needs: No Transportation Needs  . Lack of Transportation (Medical): No  . Lack of Transportation (Non-Medical): No  Physical Activity: Inactive  . Days of Exercise per Week: 0 days  . Minutes of Exercise per Session: 0 min  Stress: No Stress Concern Present  . Feeling of Stress : Not at all  Social Connections: Unknown  . Frequency of Communication with Friends and Family: Patient refused  . Frequency of Social Gatherings with Friends and Family: Patient refused  . Attends Religious Services: Patient refused  . Active Member of Clubs or Organizations: Patient refused  . Attends BankerClub or Organization Meetings: Patient refused  . Marital Status: Patient refused     Tobacco Counseling Counseling given: Not Answered   Clinical Intake:  Pre-visit preparation completed: Yes  Pain : No/denies pain Pain Score: 0-No pain     BMI - recorded: 23.64 Nutritional Status: BMI of 19-24  Normal Nutritional Risks: None Diabetes: No  How often do you need to have someone help you when you read instructions, pamphlets, or other written materials from your doctor or pharmacy?: 1 - Never What is the last grade level you completed in school?: Bachelor's Degree  Diabetic? no  Interpreter Needed?: No  Information entered by :: Dionel Archey N. Usama Harkless, LPN   Activities of Daily Living In your present state of health, do you have any difficulty performing the following activities: 07/26/2020  Hearing? N  Vision? N  Difficulty concentrating or making decisions? N  Walking or climbing stairs? N  Dressing or bathing? N  Doing errands, shopping? N  Preparing Food and eating ? N  Using the Toilet? N  In the past six months, have you accidently leaked urine? N  Do you have problems with loss of bowel control? N  Managing your Medications? N  Managing your Finances? N  Housekeeping or managing your Housekeeping? N  Some recent data might be hidden    Patient Care Team: Corwin Levins, MD as PCP - General Waymon Budge, MD as Consulting Physician (Pulmonary Disease)  Indicate any recent Medical Services you may have received from other than [facility] providers in the past year (date may be approximate).     Assessment:   This is a routine wellness examination for Awad.  Hearing/Vision screen No exam data present  Dietary issues and exercise activities discussed: Current Exercise Habits: The patient does not participate in regular exercise at present, Exercise limited by: respiratory conditions(s);orthopedic condition(s)  Goals    . decrease the amount of beer I drink     Plan to have no more than 5 beers daily.     . Patient Stated      Increase my physical activity by going to [employer] more often.      Depression Screen PHQ 2/9 Scores 07/26/2020 03/27/2020 03/27/2020 02/01/2019 03/09/2018 12/18/2016 02/12/2016  PHQ - 2 Score 0 0 0 0 0 0 0    Fall Risk Fall Risk  07/26/2020 03/27/2020 03/27/2020 06/16/2019 02/01/2019  Falls in the past year? 0 0 0 (No Data) 0  Comment - - - Emmi Telephone Survey: data to providers prior to load -  Number falls in past yr: 0 - 0 (No Data) 0  Comment - - - Emmi Telephone Survey Actual Response =  -  Injury with Fall? 0 - - - -  Risk for fall due to : No Fall Risks - No Fall Risks - -  Follow up Falls evaluation completed - Falls evaluation completed - -    Any stairs in or around the home? No  If so, are there any without handrails? No  Home free of loose throw rugs in walkways, pet beds, electrical cords, etc? Yes  Adequate lighting in your home to reduce risk of falls? Yes   ASSISTIVE DEVICES UTILIZED TO PREVENT FALLS:  Life alert? No  Use of a cane, walker or w/c? No  Grab bars in the bathroom? Yes  Shower chair or bench in shower? No  Elevated toilet seat or a handicapped toilet? No   TIMED UP AND GO:  Was the test performed? No .  Length of time to ambulate 10 feet: 0 sec.   Gait steady and fast without use of assistive device  Cognitive Function: MMSE - Mini Mental State Exam 02/01/2019 12/18/2016  Orientation to time 5 5  Orientation to Place 5 5  Registration 3 3  Attention/ Calculation 5 5  Recall 3 3  Language- name 2 objects 2 2  Language- repeat 1 1  Language- follow 3 step command 3 3  Language- read & follow direction 1 1  Write a sentence 1 1  Copy design 1 1  Total score 30 30     6CIT Screen 07/26/2020  What Year? 0 points  What month? 0 points  What time? 0 points  Count back from 20 0 points  Months in reverse 0 points  Repeat phrase 2 points  Total Score 2    Immunizations Immunization History  Administered  Date(s) Administered  . Fluad  Quad(high Dose 65+) 08/04/2019  . Influenza Split 08/27/2013, 07/28/2017, 09/16/2018  . Influenza Whole 08/17/2005, 09/16/2009  . Influenza, High Dose Seasonal PF 08/18/2017  . Influenza,inj,Quad PF,6+ Mos 07/27/2016  . Influenza-Unspecified 08/16/2018  . PFIZER SARS-COV-2 Vaccination 12/09/2019, 12/30/2019  . Pneumococcal Conjugate-13 04/10/2014  . Pneumococcal Polysaccharide-23 08/19/2006, 03/09/2018  . Zoster 08/19/2006    TDAP status: Due, Education has been provided regarding the importance of this vaccine. Advised may receive this vaccine at local pharmacy or Health Dept. Aware to provide a copy of the vaccination record if obtained from local pharmacy or Health Dept. Verbalized acceptance and understanding. Flu Vaccine status: Up to date Pneumococcal vaccine status: Up to date Covid-19 vaccine status: Completed vaccines  Qualifies for Shingles Vaccine? Yes   Zostavax completed Yes   Shingrix Completed?: No.    Education has been provided regarding the importance of this vaccine. Patient has been advised to call insurance company to determine out of pocket expense if they have not yet received this vaccine. Advised may also receive vaccine at local pharmacy or Health Dept. Verbalized acceptance and understanding.  Screening Tests Health Maintenance  Topic Date Due  . INFLUENZA VACCINE  06/16/2020  . COLONOSCOPY  12/22/2021  . TETANUS/TDAP  04/16/2022  . COVID-19 Vaccine  Completed  . Hepatitis C Screening  Completed  . PNA vac Low Risk Adult  Completed    Health Maintenance  Health Maintenance Due  Topic Date Due  . INFLUENZA VACCINE  06/16/2020    Colorectal cancer screening: Completed 12/23/2011. Repeat every 10 years  Lung Cancer Screening: (Low Dose CT Chest recommended if Age 73-80 years, 30 pack-year currently smoking OR have quit w/in 15years.) does qualify.   Lung Cancer Screening Referral: no  Additional Screening:  Hepatitis C Screening: does qualify;  Completed yes  Vision Screening: Recommended annual ophthalmology exams for early detection of glaucoma and other disorders of the eye. Is the patient up to date with their annual eye exam?  Yes  Who is the provider or what is the name of the office in which the patient attends annual eye exams? [facility] If pt is not established with a provider, would they like to be referred to a provider to establish care? No .   Dental Screening: Recommended annual dental exams for proper oral hygiene  Community Resource Referral / Chronic Care Management: CRR required this visit?  No   CCM required this visit?  No      Plan:     I have personally reviewed and noted the following in the patient's chart:   . Medical and social history . Use of alcohol, tobacco or illicit drugs  . Current medications and supplements . Functional ability and status . Nutritional status . Physical activity . Advanced directives . List of other physicians . Hospitalizations, surgeries, and ER visits in previous 12 months . Vitals . Screenings to include cognitive, depression, and falls . Referrals and appointments  In addition, I have reviewed and discussed with patient certain preventive protocols, quality metrics, and best practice recommendations. A written personalized care plan for preventive services as well as general preventive health recommendations were provided to patient.     Mickeal Needy, LPN   [id number]/77/[id number]   Nurse Notes: n/a

## 2020-07-26 NOTE — Patient Instructions (Addendum)
Ryan Diaz , Thank you for taking time to come for your Medicare Wellness Visit. I appreciate your ongoing commitment to your health goals. Please review the following plan we discussed and let me know if I can assist you in the future.   Screening recommendations/referrals: Colonoscopy: 12/23/2011; due every 10 years Recommended yearly ophthalmology/optometry visit for glaucoma screening and checkup Recommended yearly dental visit for hygiene and checkup  Vaccinations: Influenza vaccine: 08/04/2019 Pneumococcal vaccine: up to date Tdap vaccine: overdue Shingles vaccine: no record Covid-19: up to date  Advanced directives: Advance directive discussed with you today. I have provided a copy for you to complete at home and have notarized. Once this is complete please bring a copy in to our office so we can scan it into your chart.  Conditions/risks identified: Yes; Reviewed health maintenance screenings with patient today and relevant education, vaccines, and/or referrals were provided. Please continue to do your personal lifestyle choices by: daily care of teeth and gums, regular physical activity (goal should be 5 days a week for 30 minutes), eat a healthy diet, avoid tobacco and drug use, limiting any alcohol intake, taking a low-dose aspirin (if not allergic or have been advised by your provider otherwise) and taking vitamins and minerals as recommended by your provider. Continue doing brain stimulating activities (puzzles, reading, adult coloring books, staying active) to keep memory sharp. Continue to eat heart healthy diet (full of fruits, vegetables, whole grains, lean protein, water--limit salt, fat, and sugar intake) and increase physical activity as tolerated.  Next appointment: Please schedule your next Medicare Wellness Visit with your Nurse Health Advisor in 1 year.  Preventive Care 74 Years and Older, Male Preventive care refers to lifestyle choices and visits with your health care  provider that can promote health and wellness. What does preventive care include?  A yearly physical exam. This is also called an annual well check.  Dental exams once or twice a year.  Routine eye exams. Ask your health care provider how often you should have your eyes checked.  Personal lifestyle choices, including:  Daily care of your teeth and gums.  Regular physical activity.  Eating a healthy diet.  Avoiding tobacco and drug use.  Limiting alcohol use.  Practicing safe sex.  Taking low doses of aspirin every day.  Taking vitamin and mineral supplements as recommended by your health care provider. What happens during an annual well check? The services and screenings done by your health care provider during your annual well check will depend on your age, overall health, lifestyle risk factors, and family history of disease. Counseling  Your health care provider may ask you questions about your:  Alcohol use.  Tobacco use.  Drug use.  Emotional well-being.  Home and relationship well-being.  Sexual activity.  Eating habits.  History of falls.  Memory and ability to understand (cognition).  Work and work Astronomer. Screening  You may have the following tests or measurements:  Height, weight, and BMI.  Blood pressure.  Lipid and cholesterol levels. These may be checked every 5 years, or more frequently if you are over 24 years old.  Skin check.  Lung cancer screening. You may have this screening every year starting at age 70 if you have a 30-pack-year history of smoking and currently smoke or have quit within the past 15 years.  Fecal occult blood test (FOBT) of the stool. You may have this test every year starting at age 74.  Flexible sigmoidoscopy or colonoscopy. You may have  a sigmoidoscopy every 5 years or a colonoscopy every 10 years starting at age 74.  Prostate cancer screening. Recommendations will vary depending on your family history and  other risks.  Hepatitis C blood test.  Hepatitis B blood test.  Sexually transmitted disease (STD) testing.  Diabetes screening. This is done by checking your blood sugar (glucose) after you have not eaten for a while (fasting). You may have this done every 1-3 years.  Abdominal aortic aneurysm (AAA) screening. You may need this if you are a current or former smoker.  Osteoporosis. You may be screened starting at age 63 if you are at high risk. Talk with your health care provider about your test results, treatment options, and if necessary, the need for more tests. Vaccines  Your health care provider may recommend certain vaccines, such as:  Influenza vaccine. This is recommended every year.  Tetanus, diphtheria, and acellular pertussis (Tdap, Td) vaccine. You may need a Td booster every 10 years.  Zoster vaccine. You may need this after age 74.  Pneumococcal 13-valent conjugate (PCV13) vaccine. One dose is recommended after age 6.  Pneumococcal polysaccharide (PPSV23) vaccine. One dose is recommended after age 47. Talk to your health care provider about which screenings and vaccines you need and how often you need them. This information is not intended to replace advice given to you by your health care provider. Make sure you discuss any questions you have with your health care provider. Document Released: 11/29/2015 Document Revised: 07/22/2016 Document Reviewed: 09/03/2015 Elsevier Interactive Patient Education  2017 [employer].  Fall Prevention in the Home Falls can cause injuries. They can happen to people of all ages. There are many things you can do to make your home safe and to help prevent falls. What can I do on the outside of my home?  Regularly fix the edges of walkways and driveways and fix any cracks.  Remove anything that might make you trip as you walk through a door, such as a raised step or threshold.  Trim any bushes or trees on the path to your  home.  Use bright outdoor lighting.  Clear any walking paths of anything that might make someone trip, such as rocks or tools.  Regularly check to see if handrails are loose or broken. Make sure that both sides of any steps have handrails.  Any raised decks and porches should have guardrails on the edges.  Have any leaves, snow, or ice cleared regularly.  Use sand or salt on walking paths during winter.  Clean up any spills in your garage right away. This includes oil or grease spills. What can I do in the bathroom?  Use night lights.  Install grab bars by the toilet and in the tub and shower. Do not use towel bars as grab bars.  Use non-skid mats or decals in the tub or shower.  If you need to sit down in the shower, use a plastic, non-slip stool.  Keep the floor dry. Clean up any water that spills on the floor as soon as it happens.  Remove soap buildup in the tub or shower regularly.  Attach bath mats securely with double-sided non-slip rug tape.  Do not have throw rugs and other things on the floor that can make you trip. What can I do in the bedroom?  Use night lights.  Make sure that you have a light by your bed that is easy to reach.  Do not use any sheets or blankets that  are too big for your bed. They should not hang down onto the floor.  Have a firm chair that has side arms. You can use this for support while you get dressed.  Do not have throw rugs and other things on the floor that can make you trip. What can I do in the kitchen?  Clean up any spills right away.  Avoid walking on wet floors.  Keep items that you use a lot in easy-to-reach places.  If you need to reach something above you, use a strong step stool that has a grab bar.  Keep electrical cords out of the way.  Do not use floor polish or wax that makes floors slippery. If you must use wax, use non-skid floor wax.  Do not have throw rugs and other things on the floor that can make you  trip. What can I do with my stairs?  Do not leave any items on the stairs.  Make sure that there are handrails on both sides of the stairs and use them. Fix handrails that are broken or loose. Make sure that handrails are as long as the stairways.  Check any carpeting to make sure that it is firmly attached to the stairs. Fix any carpet that is loose or worn.  Avoid having throw rugs at the top or bottom of the stairs. If you do have throw rugs, attach them to the floor with carpet tape.  Make sure that you have a light switch at the top of the stairs and the bottom of the stairs. If you do not have them, ask someone to add them for you. What else can I do to help prevent falls?  Wear shoes that:  Do not have high heels.  Have rubber bottoms.  Are comfortable and fit you well.  Are closed at the toe. Do not wear sandals.  If you use a stepladder:  Make sure that it is fully opened. Do not climb a closed stepladder.  Make sure that both sides of the stepladder are locked into place.  Ask someone to hold it for you, if possible.  Clearly mark and make sure that you can see:  Any grab bars or handrails.  First and last steps.  Where the edge of each step is.  Use tools that help you move around (mobility aids) if they are needed. These include:  Canes.  Walkers.  Scooters.  Crutches.  Turn on the lights when you go into a dark area. Replace any light bulbs as soon as they burn out.  Set up your furniture so you have a clear path. Avoid moving your furniture around.  If any of your floors are uneven, fix them.  If there are any pets around you, be aware of where they are.  Review your medicines with your doctor. Some medicines can make you feel dizzy. This can increase your chance of falling. Ask your doctor what other things that you can do to help prevent falls. This information is not intended to replace advice given to you by your health care provider. Make  sure you discuss any questions you have with your health care provider. Document Released: 08/29/2009 Document Revised: 04/09/2016 Document Reviewed: 12/07/2014 Elsevier Interactive Patient Education  2017 [employer].

## 2020-07-26 NOTE — Progress Notes (Signed)
Subjective:    Patient ID: Ryan Diaz, male    DOB: 1946-08-27, 74 y.o.   MRN: [id number]  HPI  Here to f/u; overall doing ok,  Pt denies chest pain, increasing sob or doe, wheezing, orthopnea, PND, increased LE swelling, palpitations, dizziness or syncope.  Pt denies new neurological symptoms such as new headache, or facial or extremity weakness or numbness.  Pt denies polydipsia, polyuria, or low sugar episode.  Pt states overall good compliance with meds, mostly trying to follow appropriate diet, with wt overall stable,  but little exercise however. /Denies worsening depressive symptoms, suicidal ideation, or panic.  Also c/o bilateral shoulder pain right > left impingement like Past Medical History:  Diagnosis Date  . 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 ALLERGIC RHINITIS 08/08/2007  . ANXIETY 08/08/2007  . Carpal tunnel syndrome   . COPD 10/03/2009  . DEPRESSION 08/08/2007  . ERECTILE DYSFUNCTION 08/08/2007  . GENITAL HERPES, HX OF 08/08/2007  . GERD 08/08/2007  . GLUCOSE INTOLERANCE 08/08/2007  . HYPERLIPIDEMIA 08/08/2007  . HYPERTENSION 08/08/2007  . Impaired glucose tolerance 09/26/2011  . Insomnia 05/18/2012  . PAIN IN SOFT TISSUES OF LIMB 10/02/2009  . Sleep apnea    OSA-uses CPAP nightly   Past Surgical History:  Procedure Laterality Date  . ACHILLES TENDON REPAIR Bilateral   . CARPAL TUNNEL RELEASE Right 08/18/2018   Procedure: RIGHT CARPAL TUNNEL RELEASE;  Surgeon: Betha Loa, MD;  Location: [facility];  Service: Orthopedics;  Laterality: Right;  . CARPAL TUNNEL RELEASE Left 11/21/2018   Procedure: LEFT CARPAL TUNNEL RELEASE;  Surgeon: Betha Loa, MD;  Location: [facility];  Service: Orthopedics;  Laterality: Left;  Bier block  . COLONOSCOPY      reports that he quit smoking about 10 years ago. His smoking use included cigarettes. He has a 52.50 pack-year smoking history. He has never used smokeless tobacco. He reports current alcohol use of  about 46.0 - 53.0 standard drinks of alcohol per week. He reports current drug use. Drug: Marijuana. family history includes Colon polyps in his brother. No Known Allergies Current Outpatient Medications on File Prior to Visit  Medication Sig Dispense Refill  . albuterol (PROVENTIL HFA;VENTOLIN HFA) 108 (90 BASE) MCG/ACT inhaler Inhale 2 puffs into the lungs every 6 (six) hours as needed for wheezing or shortness of breath.    Marland Kitchen aspirin EC 81 MG tablet Take 81 mg by mouth daily.    Marland Kitchen atorvastatin (LIPITOR) 80 MG tablet Take 1 tablet (80 mg total) by mouth daily. 90 tablet 3  . cetirizine (ZYRTEC) 10 MG tablet Take 1 tablet (10 mg total) by mouth daily. 30 tablet 11  . diclofenac sodium (VOLTAREN) 1 % GEL Apply 2 g topically 4 (four) times daily as needed. 100 g 5  . donepezil (ARICEPT) 5 MG tablet Take 1 tablet (5 mg total) by mouth at bedtime. 90 tablet 5  . fluocinonide ointment (LIDEX) 0.05 % Apply 1 application topically 2 (two) times daily. 30 g 2  . fluticasone (CUTIVATE) 0.05 % cream SMARTSIG:Topical 1 to 2 Times Daily PRN    . Fluticasone-Umeclidin-Vilant (TRELEGY ELLIPTA) 100-62.5-25 MCG/INH AEPB Use as directed 1 puff in the mouth or throat daily. 60 each 12  . gabapentin (NEURONTIN) 100 MG capsule Take 1 capsule (100 mg total) by mouth 3 (three) times daily. 90 capsule 5  . ipratropium (ATROVENT) 0.06 % nasal spray Place 2 sprays into both nostrils 3 (three) times daily. 15 mL 5  .  Omega-3 Fatty Acids (FISH OIL) 1000 MG CAPS Take 1,000 mg by mouth 2 (two) times daily.    Marland Kitchen omeprazole (PRILOSEC) 20 MG capsule Take 1 capsule (20 mg total) by mouth 2 (two) times daily. 90 capsule 3  . terazosin (HYTRIN) 2 MG capsule Take 1 capsule (2 mg total) by mouth at bedtime. 90 capsule 3  . triamcinolone cream (KENALOG) 0.1 % SMARTSIG:Topical 1 to 2 Times Daily PRN     No current facility-administered medications on file prior to visit.   Review of Systems All otherwise neg per pt       Objective:   Physical Exam BP 128/72 (BP Location: Left Arm, Patient Position: Sitting, Cuff Size: Large)   Pulse 86   Temp 98.1 F (36.7 C) (Oral)   Ht 5\' 11"  (1.803 m)   Wt 169 lb 8 oz (76.9 kg)   SpO2 95%   BMI 23.64 kg/m  VS noted,  Constitutional: Pt appears in NAD HENT: Head: NCAT.  Right Ear: External ear normal.  Left Ear: External ear normal.  Eyes: . Pupils are equal, round, and reactive to light. Conjunctivae and EOM are normal Nose: without d/c or deformity Neck: Neck supple. Gross normal ROM Cardiovascular: Normal rate and regular rhythm.   Pulmonary/Chest: Effort normal and breath sounds without rales or wheezing.  Abd:  Soft, NT, ND, + BS, no organomegaly Neurological: Pt is alert. At baseline orientation, motor grossly intact Skin: Skin is warm. No rashes, other new lesions, no LE edema Psychiatric: Pt behavior is normal without agitation  All otherwise neg per pt Lab Results  Component Value Date   WBC 14.0 (H) 03/27/2020   HGB 15.1 03/27/2020   HCT 44.8 03/27/2020   PLT 274.0 03/27/2020   GLUCOSE 91 03/27/2020   CHOL 148 03/27/2020   TRIG 155.0 (H) 03/27/2020   HDL 37.40 (L) 03/27/2020   LDLDIRECT 83.0 10/19/2018   LDLCALC 79 03/27/2020   ALT 28 03/27/2020   AST 20 03/27/2020   NA 138 03/27/2020   K 4.7 03/27/2020   CL 104 03/27/2020   CREATININE 1.10 03/27/2020   BUN 19 03/27/2020   CO2 28 03/27/2020   TSH 0.84 03/27/2020   PSA 1.00 03/27/2020   HGBA1C 5.9 03/27/2020        Assessment & Plan:

## 2020-07-27 ENCOUNTER — Encounter: Payer: Self-pay | Admitting: Internal Medicine

## 2020-07-27 DIAGNOSIS — M25539 Pain in unspecified wrist: Secondary | ICD-10-CM | POA: Insufficient documentation

## 2020-07-27 DIAGNOSIS — M25511 Pain in right shoulder: Secondary | ICD-10-CM | POA: Insufficient documentation

## 2020-07-27 NOTE — Assessment & Plan Note (Signed)
stable overall by history and exam, recent data reviewed with pt, and pt to continue medical treatment as before,  to f/u any worsening symptoms or concerns  

## 2020-07-27 NOTE — Assessment & Plan Note (Signed)
Cont oral replacement 

## 2020-07-27 NOTE — Assessment & Plan Note (Addendum)
Impingement like, exam benign, likely rot cuff related,  to f/u any worsening symptoms or concerns, for sport med referral  I spent 31 minutes in preparing to see the patient by review of recent labs, imaging and procedures, obtaining and reviewing separately obtained history, communicating with the patient and family or caregiver, ordering medications, tests or procedures, and documenting clinical information in the EHR including the differential Dx, treatment, and any further evaluation and other management of shoulder pain, copd, depression, htn, hyperglycemia, vit d def

## 2020-07-29 ENCOUNTER — Encounter: Payer: Self-pay | Admitting: Internal Medicine

## 2020-08-01 ENCOUNTER — Ambulatory Visit (INDEPENDENT_AMBULATORY_CARE_PROVIDER_SITE_OTHER): Payer: PPO | Admitting: Family Medicine

## 2020-08-01 ENCOUNTER — Ambulatory Visit: Payer: Self-pay

## 2020-08-01 ENCOUNTER — Encounter: Payer: Self-pay | Admitting: Family Medicine

## 2020-08-01 ENCOUNTER — Other Ambulatory Visit: Payer: Self-pay

## 2020-08-01 VITALS — BP 140/90 | HR 85 | Ht 71.0 in | Wt 170.0 lb

## 2020-08-01 DIAGNOSIS — M25511 Pain in right shoulder: Secondary | ICD-10-CM | POA: Diagnosis not present

## 2020-08-01 DIAGNOSIS — G8929 Other chronic pain: Secondary | ICD-10-CM

## 2020-08-01 DIAGNOSIS — M25512 Pain in left shoulder: Secondary | ICD-10-CM | POA: Diagnosis not present

## 2020-08-01 NOTE — Progress Notes (Signed)
Subjective:   I Ryan Diaz am serving as a Neurosurgeon for Dr. Clementeen Graham.  CC: B shoulder pain, R >L  HPI: Pt is a 74 y/o male presenting w/ c/o B shoulder pain, R >L.  He locates his pain to lateral shoulder. History of carpal tunnel in the right hand. Right middle finger numbness.  No injury.  Pain ongoing for about 3 months.  Radiating pain: Shoulder mechanical symptoms: ROM is limited  Aggravating factors: lifting/ flexion  Treatments tried: No (does not like ice)   Diagnostic imaging: R shoulder XR- 07/26/20  Pertinent review of Systems: No fevers or chills  Relevant historical information: COPD hypertension alcohol use.   Objective:    Vitals:   08/01/20 1055  BP: 140/90  Pulse: 85  SpO2: 96%   General: Well Developed, well nourished, and in no acute distress.   MSK: Right shoulder normal-appearing nontender. Normal motion pain with abduction. Strength intact abduction external/internal rotation. Positive Hawkins and Neer's test positive crossover arm compression test positive empty can test. Negative Yergason's and speeds test.  Left shoulder normal-appearing nontender. Normal motion. Strength intact abduction external and internal rotation. Positive Hawkins and Neer's test.  Positive crossarm compression test.  Positive empty can test. Negative Yergason's and speeds test.  Pulses cap refill and sensation are intact distal bilateral upper extremities.  Lab and Radiology Results DG Shoulder Right  Result Date: 07/27/2020 CLINICAL DATA:  Chronic right shoulder pain. EXAM: RIGHT SHOULDER - 2+ VIEW COMPARISON:  None. FINDINGS: There is no evidence of fracture or dislocation. Mild degenerative changes seen involving the right acromioclavicular joint. Soft tissues are unremarkable. IMPRESSION: Mild degenerative changes involving the right acromioclavicular joint. Electronically Signed   By: Aram Candela M.D.   On: 07/27/2020 22:28   I, Clementeen Graham,  personally (independently) visualized and performed the interpretation of the images attached in this note. Agree with radiology read.  Mild AC DJD.  Diagnostic Limited MSK Ultrasound of: Right shoulder Biceps tendon intact in bicipital groove. Subscapularis tendon intact with hyperechoic change distal tendon insertion without visible tear. Supraspinatus tendon appears to be intact with moderate subacromial bursa thickness. Infraspinatus tendon is intact. AC joint narrowed degenerative Impression: Subacromial bursitis.  Chronic calcific tendinopathy subscapularis tendon.  Diagnostic Limited MSK Ultrasound of: Left shoulder Biceps tendon intact in bicipital groove. Subscapularis tendon is intact. Supraspinatus tendon with hypoechoic fluid tracking superficial to tendon representing either incomplete rotator cuff tear or more linear split type versus large subacromial bursitis.  No significant tendon retraction present. Infraspinatus tendon is intact and normal. AC joint narrowed degenerative Impression: Subacromial bursitis versus partial-thickness supraspinatus tear   Impression and Recommendations:    Assessment and Plan: 74 y.o. male with bilateral shoulder pain due to rotator cuff tendinopathy subacromial bursitis and possible partial-thickness rotator cuff tear.  After discussion of options plan for physical therapy trial along with Voltaren gel.  Patient would like to avoid steroid injections if possible.  Recheck in about 6 weeks.  Return sooner if needed..   Orders Placed This Encounter  Procedures  . [place] LIMITED JOINT SPACE STRUCTURES UP RIGHT(NO LINKED CHARGES)    Order Specific Question:   Reason for Exam (SYMPTOM  OR DIAGNOSIS REQUIRED)    Answer:   bilateral shoulder pain    Order Specific Question:   Preferred imaging location?    Answer:   Internal  . Ambulatory referral to Physical Therapy    Referral Priority:   Routine    Referral Type:  Physical Medicine     Referral Reason:   Specialty Services Required    Requested Specialty:   Physical Therapy   No orders of the defined types were placed in this encounter.   Discussed warning signs or symptoms. Please see discharge instructions. Patient expresses understanding.   The above documentation has been reviewed and is accurate and complete Clementeen Graham, M.D.

## 2020-08-01 NOTE — Patient Instructions (Signed)
Thank you for coming in today. Plan for PT.  Ok to continue melxociam medicine prescribe by Dr Jonny Ruiz.  Ok to also use over the counter voltaren gel on the shoulder.   Recheck with me in 6 weeks.  If not better next step is injection.

## 2020-08-13 ENCOUNTER — Ambulatory Visit: Payer: PPO | Attending: Internal Medicine

## 2020-08-13 DIAGNOSIS — Z23 Encounter for immunization: Secondary | ICD-10-CM

## 2020-08-13 NOTE — Progress Notes (Signed)
   Covid-19 Vaccination Clinic  Name:  Ryan Diaz    MRN: [id number] DOB: June 12, 1946  08/13/2020  [name] was observed post Covid-19 immunization for 15 minutes without incident. He was provided with Vaccine Information Sheet and instruction to access the V-Safe system.   [name] was instructed to call 911 with any severe reactions post vaccine: Marland Kitchen Difficulty breathing  . Swelling of face and throat  . A fast heartbeat  . A bad rash all over body  . Dizziness and weakness

## 2020-08-23 ENCOUNTER — Ambulatory Visit: Payer: PPO | Admitting: Physical Therapy

## 2020-08-23 ENCOUNTER — Ambulatory Visit: Payer: PPO | Attending: Family Medicine | Admitting: Physical Therapy

## 2020-08-23 ENCOUNTER — Other Ambulatory Visit: Payer: Self-pay

## 2020-08-23 DIAGNOSIS — M6281 Muscle weakness (generalized): Secondary | ICD-10-CM | POA: Diagnosis not present

## 2020-08-23 DIAGNOSIS — M25511 Pain in right shoulder: Secondary | ICD-10-CM | POA: Insufficient documentation

## 2020-08-23 DIAGNOSIS — G8929 Other chronic pain: Secondary | ICD-10-CM | POA: Insufficient documentation

## 2020-08-23 DIAGNOSIS — M25512 Pain in left shoulder: Secondary | ICD-10-CM | POA: Insufficient documentation

## 2020-08-23 NOTE — Therapy (Signed)
[facility] Outpatient Rehabilitation [facility] [address] [place], [place], [address] Phone: [phone]   Fax:  [phone]  Physical Therapy Evaluation  Patient Details  Name: Ryan Diaz MRN: [id number] Date of Birth: [birth year] Referring Provider (PT): Rodolph Bong, MD   Encounter Date: 08/23/2020   PT End of Session - 08/23/20 0851    Visit Number 1    Number of Visits 12    Date for PT Re-Evaluation 10/04/20    Authorization Type [employer]    PT Start Time 0845    PT Stop Time 0915    PT Time Calculation (min) 30 min    Activity Tolerance Patient tolerated treatment well    Behavior During Therapy [facility] for tasks assessed/performed           Past Medical History:  Diagnosis Date  . 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 ALLERGIC RHINITIS 08/08/2007  . ANXIETY 08/08/2007  . Carpal tunnel syndrome   . COPD 10/03/2009  . DEPRESSION 08/08/2007  . ERECTILE DYSFUNCTION 08/08/2007  . GENITAL HERPES, HX OF 08/08/2007  . GERD 08/08/2007  . GLUCOSE INTOLERANCE 08/08/2007  . HYPERLIPIDEMIA 08/08/2007  . HYPERTENSION 08/08/2007  . Impaired glucose tolerance 09/26/2011  . Insomnia 05/18/2012  . PAIN IN SOFT TISSUES OF LIMB 10/02/2009  . Sleep apnea    OSA-uses CPAP nightly    Past Surgical History:  Procedure Laterality Date  . ACHILLES TENDON REPAIR Bilateral   . CARPAL TUNNEL RELEASE Right 08/18/2018   Procedure: RIGHT CARPAL TUNNEL RELEASE;  Surgeon: Betha Loa, MD;  Location: [facility];  Service: Orthopedics;  Laterality: Right;  . CARPAL TUNNEL RELEASE Left 11/21/2018   Procedure: LEFT CARPAL TUNNEL RELEASE;  Surgeon: Betha Loa, MD;  Location: [facility];  Service: Orthopedics;  Laterality: Left;  Bier block  . COLONOSCOPY      There were no vitals filed for this visit.    Subjective Assessment - 08/23/20 0846    Subjective Patient reports right > left shoulder pain for past 3-4 months. He bought an 8#  weight a few months ago and tried to lift it and it felt like he didn't have any strength in it. Currently has pain and difficulty lifting right shoulder. Sleep disturbances when lying on right shoulder.    Limitations Lifting;House hold activities    Patient Stated Goals Get shoulder stronger    Currently in Pain? Yes    Pain Score 6     Pain Location Shoulder    Pain Orientation Right   R > L   Pain Descriptors / Indicators Sore;Aching;Sharp    Pain Type Chronic pain    Pain Onset More than a month ago    Pain Frequency Constant    Aggravating Factors  Raising the shoulder up    Pain Relieving Factors Rest    Effect of Pain on Daily Activities Patient limited using right shoulder overhead              [facility] PT Assessment - 08/23/20 0001      Assessment   Medical Diagnosis Chronic pain of both shoulders    Referring Provider (PT) Rodolph Bong, MD    Onset Date/Surgical Date --   3-4 months ago   Hand Dominance Right   write left handed   Next MD Visit 09/12/2020    Prior Therapy None      Precautions   Precautions None      Restrictions   Weight  Bearing Restrictions No      Balance Screen   Has the patient fallen in the past 6 months No    Has the patient had a decrease in activity level because of a fear of falling?  No    Is the patient reluctant to leave their home because of a fear of falling?  No      Prior Function   Level of Independence Independent    Vocation Retired    Leisure None reported      Cognition   Overall Cognitive Status Within Functional Limits for tasks assessed      Observation/Other Assessments   Observations Patient appears in no apparent distress    Focus on Therapeutic Outcomes (FOTO)  FOTO was assessed but website down to assess score      Sensation   Light Touch Appears Intact      Coordination   Gross Motor Movements are Fluid and Coordinated Yes      Posture/Postural Control   Posture Comments Patient demonstrates rounded  shoulder and forward head posture      ROM / Strength   AROM / PROM / Strength AROM;PROM;Strength      AROM   Overall AROM Comments Pain with all right shoulder movements    AROM Assessment Site Shoulder    Right/Left Shoulder Right;Left    Right Shoulder Flexion 120 Degrees    Right Shoulder ABduction 90 Degrees    Right Shoulder Internal Rotation --   reach to L1   Right Shoulder External Rotation --   reach to C7   Left Shoulder Flexion 160 Degrees    Left Shoulder ABduction 160 Degrees    Left Shoulder Internal Rotation --   reach to T8   Left Shoulder External Rotation --   reach to T4     PROM   PROM Assessment Site Shoulder    Right/Left Shoulder Right    Right Shoulder Flexion 160 Degrees    Right Shoulder External Rotation 80 Degrees   at 45 and 90 deg abd     Strength   Overall Strength Comments Increased pain with all right shoulder muscle testing    Strength Assessment Site Shoulder    Right/Left Shoulder Right;Left    Right Shoulder Flexion 3-/5    Right Shoulder Extension 4-/5    Right Shoulder ABduction 3-/5    Right Shoulder Internal Rotation 4/5    Right Shoulder External Rotation 3-/5    Left Shoulder Flexion 4+/5    Left Shoulder Extension 4/5    Left Shoulder ABduction 4+/5    Left Shoulder Internal Rotation 5/5    Left Shoulder External Rotation 4+/5      Palpation   Palpation comment TTP right periacromial region      Special Tests    Special Tests Rotator Cuff Impingement    Rotator Cuff Impingment tests Leanord AsalHawkins- Kennedy test;Lag signs at 90 degrees      Hawkins-Kennedy test   Findings Positive    Side Right      Lag signs at 90 degrees    Findings Positive    Side Right      Transfers   Transfers Independent with all Transfers                      Objective measurements completed on examination: See above findings.       [facility] Adult PT Treatment/Exercise - 08/23/20 0001      Exercises  Exercises Shoulder       Shoulder Exercises: Supine   Flexion 10 reps    Flexion Limitations dowel press with overhead stretch      Shoulder Exercises: Standing   Flexion 10 reps    Flexion Limitations wall towel slide    Row 10 reps    Theraband Level (Shoulder Row) Level 3 Chilton Si)                  PT Education - 08/23/20 0850    Education Details Exam findings, POC, HEP    Person(s) Educated Patient    Methods Explanation;Demonstration;Tactile cues;Verbal cues;Handout    Comprehension Verbalized understanding;Returned demonstration;Verbal cues required;Tactile cues required;Need further instruction            PT Short Term Goals - 08/23/20 0856      PT SHORT TERM GOAL #1   Title Patient will be I with initial HEP to progress with PT    Time 3    Period Weeks    Status New    Target Date 09/13/20      PT SHORT TERM GOAL #2   Title Review FOTO with patient    Time 2    Period Weeks    Status New    Target Date 09/06/20             PT Long Term Goals - 08/23/20 0857      PT LONG TERM GOAL #1   Title Patient will be I with final HEP to maintain progress from PT    Time 6    Period Weeks    Status New    Target Date 10/04/20      PT LONG TERM GOAL #2   Title Patient will demonstrate right shoulder flexion >/= 150 deg to improve reaching into high cabinet    Time 6    Period Weeks    Status New    Target Date 10/04/20      PT LONG TERM GOAL #3   Title Patient will exhibit gross right shoulder strength >/= 4/5 MMT to improve ability to lift objects into cabinet at shoulder height    Time 6    Period Weeks    Status New    Target Date 10/04/20      PT LONG TERM GOAL #4   Title Patient will report improved functional level of </= % on FOTO    Baseline FOTO was assessed but website down to assess score so update goal at next visit    Time 6    Period Weeks    Status New    Target Date 10/04/20                  Plan - 08/23/20 0851    Clinical Impression  Statement Evaluation limited due to scheduling issues. Patient presents to PT with report of right shoulder pain, occasional left shoulder pain, and limitations raising right arm overhead. Currently he demonstrates limitation with right shoulder active flexion with pain, his PROM is full without pain, rotator cuff and periscapular strength deficits, and postural deviations. His symptoms are consistent with subacromial pain likely consisting of rotator cuff injury. He was provided with exercises to initiate shoulder elevation AAROM and postural strengthening. He would benefit from continued skilled PT to progress his motion and strength in order to reduce pain and improve overhead function with right arm.    Personal Factors and Comorbidities Past/Current Experience;Time since onset of injury/illness/exacerbation;Age  Examination-Activity Limitations Reach Overhead;Dressing;Sleep;Carry    Examination-Participation Restrictions Cleaning;Yard Work;Shop    Stability/Clinical Decision Making Stable/Uncomplicated    Clinical Decision Making Low    Rehab Potential Good    PT Frequency 2x / week    PT Duration 6 weeks    PT Treatment/Interventions ADLs/Self Care Home Management;Cryotherapy;Electrical Stimulation;Iontophoresis 4mg /ml Dexamethasone;Moist Heat;Neuromuscular re-education;Therapeutic exercise;Therapeutic activities;Patient/family education;Manual techniques;Dry needling;Passive range of motion;Taping;Spinal Manipulations;Joint Manipulations    PT Next Visit Plan Review FOTO, Review HEP and progress PRN, manual and/or modalities for right shoulder pain PRN, progress right shoulder AAROM for elevation, rotator cuff and periscapular strengthening    PT Home Exercise Plan [email or web address]: supine AAROM dowel press, wall towel slide flexion, row with green    Consulted and Agree with Plan of Care Patient           Patient will benefit from skilled therapeutic intervention in order to improve the following  deficits and impairments:  Decreased range of motion, Pain, Decreased activity tolerance, Postural dysfunction, Decreased strength  Visit Diagnosis: Chronic right shoulder pain  Chronic left shoulder pain  Muscle weakness (generalized)     Problem List Patient Active Problem List   Diagnosis Date Noted  . Acute pain of right shoulder 07/27/2020  . Vitamin D deficiency 06/01/2020  . Weight loss 09/19/2019  . Rash 03/27/2019  . Right foot pain 12/08/2018  . Blurred vision, bilateral 10/19/2018  . Bilateral carpal tunnel syndrome 07/11/2018  . Tenosynovitis of hand 07/11/2018  . Right ankle pain 03/09/2018  . Facial rash 10/16/2017  . Bruising 02/11/2017  . Cough 12/16/2016  . Wheezing 12/16/2016  . Memory dysfunction 07/15/2016  . Dizziness 07/10/2015  . Mastoiditis 07/10/2015  . Fatigue 07/10/2015  . Abnormal TSH 07/10/2015  . Pulsatile tinnitus of right ear 01/23/2015  . Renal insufficiency 01/09/2015  . Bilateral ankle pain 04/10/2014  . OSA (obstructive sleep apnea) 09/27/2013  . Right carpal tunnel syndrome 07/06/2013  . Insomnia 05/18/2012  . Nocturia 11/18/2011  . Impaired glucose tolerance 09/26/2011  . Preventative health care 09/26/2011  . COPD mixed type (HCC) 10/03/2009  . Hyperlipidemia 08/08/2007  . Anxiety state 08/08/2007  . ERECTILE DYSFUNCTION 08/08/2007  . 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 Depression 08/08/2007  . Essential hypertension 08/08/2007  . Allergic rhinitis 08/08/2007  . GERD 08/08/2007  . GENITAL HERPES, HX OF 08/08/2007    08/10/2007, PT, DPT, LAT, ATC 08/23/20  3:15 PM Phone: [phone] Fax: [phone]   [facility] Outpatient Rehabilitation [facility] [address] [place], [place], [place] Phone: [phone]   Fax:  [phone]  Name: Ryan Diaz MRN: Irven Coe Date of Birth: 09-09-1946

## 2020-08-23 NOTE — Patient Instructions (Signed)
Access Code: [email or web address] URL: [email or web address] Date: 08/23/2020 Prepared by: Rosana Hoes  Exercises Supine Shoulder Press with Dowel - 2 x daily - 7 x weekly - 2 sets - 10 reps Shoulder Flexion Wall Slide with Towel - 2 x daily - 7 x weekly - 2 sets - 10 reps Banded Row - 2 x daily - 7 x weekly - 2 sets - 10 reps

## 2020-09-03 ENCOUNTER — Ambulatory Visit: Payer: PPO

## 2020-09-03 ENCOUNTER — Other Ambulatory Visit: Payer: Self-pay

## 2020-09-03 DIAGNOSIS — M25512 Pain in left shoulder: Secondary | ICD-10-CM

## 2020-09-03 DIAGNOSIS — M6281 Muscle weakness (generalized): Secondary | ICD-10-CM

## 2020-09-03 DIAGNOSIS — M25511 Pain in right shoulder: Secondary | ICD-10-CM | POA: Diagnosis not present

## 2020-09-03 DIAGNOSIS — G8929 Other chronic pain: Secondary | ICD-10-CM

## 2020-09-03 NOTE — Therapy (Signed)
[facility] [address] [place], [place], [address] Phone: [phone]   Fax:  [phone]  Physical Therapy Treatment  Patient Details  Name: Ryan Diaz MRN: [id number] Date of Birth: Jun 09, 1946 Referring Provider (PT): Rodolph Bong, MD   Encounter Date: 09/03/2020   PT End of Session - 09/03/20 1339    Visit Number 2    Number of Visits 12    Date for PT Re-Evaluation 10/04/20    Authorization Type [employer]    PT Start Time 0850    PT Stop Time 0930    PT Time Calculation (min) 40 min    Activity Tolerance Patient tolerated treatment well    Behavior During Therapy [facility] for tasks assessed/performed           Past Medical History:  Diagnosis Date   ABUSE, ALCOHOL, CONTINUOUS 08/08/2007   ALLERGIC RHINITIS 08/08/2007   ANXIETY 08/08/2007   Carpal tunnel syndrome    COPD 10/03/2009   DEPRESSION 08/08/2007   ERECTILE DYSFUNCTION 08/08/2007   GENITAL HERPES, HX OF 08/08/2007   GERD 08/08/2007   GLUCOSE INTOLERANCE 08/08/2007   HYPERLIPIDEMIA 08/08/2007   HYPERTENSION 08/08/2007   Impaired glucose tolerance 09/26/2011   Insomnia 05/18/2012   PAIN IN SOFT TISSUES OF LIMB 10/02/2009   Sleep apnea    OSA-uses CPAP nightly    Past Surgical History:  Procedure Laterality Date   ACHILLES TENDON REPAIR Bilateral    CARPAL TUNNEL RELEASE Right 08/18/2018   Procedure: RIGHT CARPAL TUNNEL RELEASE;  Surgeon: Betha Loa, MD;  Location: [facility];  Service: Orthopedics;  Laterality: Right;   CARPAL TUNNEL RELEASE Left 11/21/2018   Procedure: LEFT CARPAL TUNNEL RELEASE;  Surgeon: Betha Loa, MD;  Location:  SURGERY CENTER;  Service: Orthopedics;  Laterality: Left;  Bier block   COLONOSCOPY      There were no vitals filed for this visit.   Subjective Assessment - 09/03/20 1337    Subjective Pt reports his shoulder doesn't feel better yet. It still hurts mainly with  lifting his arm over shoulder height.    Limitations Lifting;House hold activities    Patient Stated Goals Get shoulder stronger    Currently in Pain? Yes    Pain Score 5     Pain Location Shoulder    Pain Orientation Right    Pain Descriptors / Indicators Aching;Sharp;Sore    Pain Type Chronic pain    Pain Onset More than a month ago                             [facility] Adult PT Treatment/Exercise - 09/03/20 0001      Shoulder Exercises: Seated   Other Seated Exercises T/S Ext over chair c pillow HBH x 15      Shoulder Exercises: Sidelying   Other Sidelying Exercises Open books x15 R HBH      Shoulder Exercises: Standing   External Rotation AROM;Strengthening;Both;15 reps    External Rotation Limitations + scap retract @ corner                    PT Short Term Goals - 08/23/20 0856      PT SHORT TERM GOAL #1   Title Patient will be I with initial HEP to progress with PT    Time 3    Period Weeks    Status New    Target Date 09/13/20  PT SHORT TERM GOAL #2   Title Review FOTO with patient    Time 2    Period Weeks    Status New    Target Date 09/06/20             PT Long Term Goals - 08/23/20 0857      PT LONG TERM GOAL #1   Title Patient will be I with final HEP to maintain progress from PT    Time 6    Period Weeks    Status New    Target Date 10/04/20      PT LONG TERM GOAL #2   Title Patient will demonstrate right shoulder flexion >/= 150 deg to improve reaching into high cabinet    Time 6    Period Weeks    Status New    Target Date 10/04/20      PT LONG TERM GOAL #3   Title Patient will exhibit gross right shoulder strength >/= 4/5 MMT to improve ability to lift objects into cabinet at shoulder height    Time 6    Period Weeks    Status New    Target Date 10/04/20      PT LONG TERM GOAL #4   Title Patient will report improved functional level of </= % on FOTO    Baseline FOTO was assessed but website down to  assess score so update goal at next visit    Time 6    Period Weeks    Status New    Target Date 10/04/20                 Plan - 09/03/20 1343    Clinical Impression Statement Pt responded well to manual therapy with no report of pain throughout session. Added thoracic mobility and isolated scap retraction with shoulder ER to focus on shoulder positioning.    Personal Factors and Comorbidities Past/Current Experience;Time since onset of injury/illness/exacerbation;Age    Examination-Activity Limitations Reach Overhead;Dressing;Sleep;Carry    Examination-Participation Restrictions Cleaning;Yard Work;Shop    Stability/Clinical Decision Making Stable/Uncomplicated    Rehab Potential Good    PT Frequency 2x / week    PT Duration 6 weeks    PT Treatment/Interventions ADLs/Self Care Home Management;Cryotherapy;Electrical Stimulation;Iontophoresis 4mg /ml Dexamethasone;Moist Heat;Neuromuscular re-education;Therapeutic exercise;Therapeutic activities;Patient/family education;Manual techniques;Dry needling;Passive range of motion;Taping;Spinal Manipulations;Joint Manipulations    PT Next Visit Plan Review FOTO, Review HEP and progress PRN, manual and/or modalities for right shoulder pain PRN, progress right shoulder AAROM for elevation, rotator cuff and periscapular strengthening    PT Home Exercise Plan [id number]: supine AAROM dowel press, wall towel slide flexion, row with green; S/L open books, seated T/S ext over chair, standing scap retract + ER at corner    Consulted and Agree with Plan of Care Patient           Patient will benefit from skilled therapeutic intervention in order to improve the following deficits and impairments:  Decreased range of motion, Pain, Decreased activity tolerance, Postural dysfunction, Decreased strength  Visit Diagnosis: Chronic right shoulder pain  Chronic left shoulder pain  Muscle weakness (generalized)     Problem List Patient Active Problem  List   Diagnosis Date Noted   Acute pain of right shoulder 07/27/2020   Vitamin D deficiency 06/01/2020   Weight loss 09/19/2019   Rash 03/27/2019   Right foot pain 12/08/2018   Blurred vision, bilateral 10/19/2018   Bilateral carpal tunnel syndrome 07/11/2018   Tenosynovitis of hand 07/11/2018   Right  ankle pain 03/09/2018   Facial rash 10/16/2017   Bruising 02/11/2017   Cough 12/16/2016   Wheezing 12/16/2016   Memory dysfunction 07/15/2016   Dizziness 07/10/2015   Mastoiditis 07/10/2015   Fatigue 07/10/2015   Abnormal TSH 07/10/2015   Pulsatile tinnitus of right ear 01/23/2015   Renal insufficiency 01/09/2015   Bilateral ankle pain 04/10/2014   OSA (obstructive sleep apnea) 09/27/2013   Right carpal tunnel syndrome 07/06/2013   Insomnia 05/18/2012   Nocturia 11/18/2011   Impaired glucose tolerance 09/26/2011   Preventative health care 09/26/2011   COPD mixed type (HCC) 10/03/2009   Hyperlipidemia 08/08/2007   Anxiety state 08/08/2007   ERECTILE DYSFUNCTION 08/08/2007   ABUSE, ALCOHOL, CONTINUOUS 08/08/2007   Depression 08/08/2007   Essential hypertension 08/08/2007   Allergic rhinitis 08/08/2007   GERD 08/08/2007   GENITAL HERPES, HX OF 08/08/2007    Marcelline Mates, PT, DPT 09/03/2020, 1:55 PM  [facility] [address] [place], [place], [address] Phone: [phone]   Fax:  [phone]  Name: ESTEBAN KOBASHIGAWA MRN: [id number] Date of Birth: 06-10-1946

## 2020-09-05 ENCOUNTER — Ambulatory Visit: Payer: PPO

## 2020-09-05 ENCOUNTER — Other Ambulatory Visit: Payer: Self-pay

## 2020-09-05 ENCOUNTER — Telehealth: Payer: Self-pay | Admitting: Internal Medicine

## 2020-09-05 DIAGNOSIS — M25511 Pain in right shoulder: Secondary | ICD-10-CM | POA: Diagnosis not present

## 2020-09-05 DIAGNOSIS — M6281 Muscle weakness (generalized): Secondary | ICD-10-CM

## 2020-09-05 DIAGNOSIS — M25512 Pain in left shoulder: Secondary | ICD-10-CM

## 2020-09-05 DIAGNOSIS — G8929 Other chronic pain: Secondary | ICD-10-CM

## 2020-09-05 NOTE — Telephone Encounter (Signed)
Called and spoke with patient who states that he is still having shortness of breath with exertion and chest gets tight when it happens, feels like he has not got better with Trelegy. Patient would like to do whatever testing can be done to check his lung capacity mentioned breathing test as well as incentive spirometer. Offered to make an appointment with Dr. Maple Hudson so that they can discuss options. Patient is now scheduled with Dr. Maple Hudson on 09/10/20. Nothing further needed at this time. Will route to Dr. Maple Hudson as an Lorain Childes

## 2020-09-05 NOTE — Therapy (Signed)
[facility] [address] [place], [place], [address] Phone: [phone]   Fax:  [phone]  Physical Therapy Treatment  Patient Details  Name: Ryan Diaz MRN: [id number] Date of Birth: 11/25/1945 Referring Provider (PT): Rodolph Bong, MD   Encounter Date: 09/05/2020   PT End of Session - 09/05/20 1029    Visit Number 3    Number of Visits 12    Date for PT Re-Evaluation 10/04/20    Authorization Type [employer]    PT Start Time 1015    PT Stop Time 1100    PT Time Calculation (min) 45 min    Activity Tolerance Patient tolerated treatment well    Behavior During Therapy [facility] for tasks assessed/performed           Past Medical History:  Diagnosis Date  . 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 ALLERGIC RHINITIS 08/08/2007  . ANXIETY 08/08/2007  . Carpal tunnel syndrome   . COPD 10/03/2009  . DEPRESSION 08/08/2007  . ERECTILE DYSFUNCTION 08/08/2007  . GENITAL HERPES, HX OF 08/08/2007  . GERD 08/08/2007  . GLUCOSE INTOLERANCE 08/08/2007  . HYPERLIPIDEMIA 08/08/2007  . HYPERTENSION 08/08/2007  . Impaired glucose tolerance 09/26/2011  . Insomnia 05/18/2012  . PAIN IN SOFT TISSUES OF LIMB 10/02/2009  . Sleep apnea    OSA-uses CPAP nightly    Past Surgical History:  Procedure Laterality Date  . ACHILLES TENDON REPAIR Bilateral   . CARPAL TUNNEL RELEASE Right 08/18/2018   Procedure: RIGHT CARPAL TUNNEL RELEASE;  Surgeon: Betha Loa, MD;  Location: [facility];  Service: Orthopedics;  Laterality: Right;  . CARPAL TUNNEL RELEASE Left 11/21/2018   Procedure: LEFT CARPAL TUNNEL RELEASE;  Surgeon: Betha Loa, MD;  Location: [facility];  Service: Orthopedics;  Laterality: Left;  Bier block  . COLONOSCOPY      There were no vitals filed for this visit.   Subjective Assessment - 09/05/20 1028    Subjective Pt states his shoulder felt better after tx on Tues, but then it bothers him at  night from sleeping on it.    Limitations Lifting;House hold activities    Patient Stated Goals Get shoulder stronger    Currently in Pain? No/denies    Pain Onset More than a month ago              [facility] PT Assessment - 09/05/20 0001      AROM   Right Shoulder Flexion 152 Degrees    Right Shoulder ABduction 170 Degrees                         [facility] Adult PT Treatment/Exercise - 09/05/20 0001      Shoulder Exercises: Seated   Retraction Strengthening;Both;15 reps    Retraction Limitations 5 seconds    Other Seated Exercises T/S Ext over chair c pillow HBH x 15      Shoulder Exercises: Sidelying   Other Sidelying Exercises Open books x15 R HBH    Other Sidelying Exercises iso ER with arm squeeze to side x 10      Manual Therapy   Manual Therapy Joint mobilization;Soft tissue mobilization;Passive ROM    Joint Mobilization post/inf GHJ glides grade 3    Soft tissue mobilization pecs, UT, biceps, deltoid    Passive ROM flexion, ER                    PT Short Term  Goals - 08/23/20 0856      PT SHORT TERM GOAL #1   Title Patient will be I with initial HEP to progress with PT    Time 3    Period Weeks    Status New    Target Date 09/13/20      PT SHORT TERM GOAL #2   Title Review FOTO with patient    Time 2    Period Weeks    Status New    Target Date 09/06/20             PT Long Term Goals - 08/23/20 0857      PT LONG TERM GOAL #1   Title Patient will be I with final HEP to maintain progress from PT    Time 6    Period Weeks    Status New    Target Date 10/04/20      PT LONG TERM GOAL #2   Title Patient will demonstrate right shoulder flexion >/= 150 deg to improve reaching into high cabinet    Time 6    Period Weeks    Status New    Target Date 10/04/20      PT LONG TERM GOAL #3   Title Patient will exhibit gross right shoulder strength >/= 4/5 MMT to improve ability to lift objects into cabinet at shoulder height    Time  6    Period Weeks    Status New    Target Date 10/04/20      PT LONG TERM GOAL #4   Title Patient will report improved functional level of </= % on FOTO    Baseline FOTO was assessed but website down to assess score so update goal at next visit    Time 6    Period Weeks    Status New    Target Date 10/04/20                 Plan - 09/05/20 1029    Clinical Impression Statement Pt able to perform full flexion and abduction AROM today in R shoulder with no pain. He did have some discomfort with prolonged HBH for seated T/S ext and edu on how to avoid. He was unable to add resistance to seated ER due to R shoulder weakness even with YTB. Difficulty hold forearm up in S/L for iso ER so provided with new exercise to add self resistance using LUE in sitting for ER.    Personal Factors and Comorbidities Past/Current Experience;Time since onset of injury/illness/exacerbation;Age    Examination-Activity Limitations Reach Overhead;Dressing;Sleep;Carry    Examination-Participation Restrictions Cleaning;Yard Work;Shop    Stability/Clinical Decision Making Stable/Uncomplicated    Rehab Potential Good    PT Frequency 2x / week    PT Duration 6 weeks    PT Treatment/Interventions ADLs/Self Care Home Management;Cryotherapy;Electrical Stimulation;Iontophoresis 4mg /ml Dexamethasone;Moist Heat;Neuromuscular re-education;Therapeutic exercise;Therapeutic activities;Patient/family education;Manual techniques;Dry needling;Passive range of motion;Taping;Spinal Manipulations;Joint Manipulations    PT Next Visit Plan Review FOTO, Review HEP and progress PRN, manual and/or modalities for right shoulder pain PRN, progress right shoulder AAROM for elevation, rotator cuff (esp ER) and periscapular strengthening    PT Home Exercise Plan [id number]: supine AAROM dowel press, wall towel slide flexion, row with green; S/L open books, seated T/S ext over chair, standing scap retract + ER at corner    Consulted and Agree  with Plan of Care Patient           Patient will benefit from skilled therapeutic intervention in  order to improve the following deficits and impairments:  Decreased range of motion, Pain, Decreased activity tolerance, Postural dysfunction, Decreased strength  Visit Diagnosis: Chronic right shoulder pain  Chronic left shoulder pain  Muscle weakness (generalized)     Problem List Patient Active Problem List   Diagnosis Date Noted  . Acute pain of right shoulder 07/27/2020  . Vitamin D deficiency 06/01/2020  . Weight loss 09/19/2019  . Rash 03/27/2019  . Right foot pain 12/08/2018  . Blurred vision, bilateral 10/19/2018  . Bilateral carpal tunnel syndrome 07/11/2018  . Tenosynovitis of hand 07/11/2018  . Right ankle pain 03/09/2018  . Facial rash 10/16/2017  . Bruising 02/11/2017  . Cough 12/16/2016  . Wheezing 12/16/2016  . Memory dysfunction 07/15/2016  . Dizziness 07/10/2015  . Mastoiditis 07/10/2015  . Fatigue 07/10/2015  . Abnormal TSH 07/10/2015  . Pulsatile tinnitus of right ear 01/23/2015  . Renal insufficiency 01/09/2015  . Bilateral ankle pain 04/10/2014  . OSA (obstructive sleep apnea) 09/27/2013  . Right carpal tunnel syndrome 07/06/2013  . Insomnia 05/18/2012  . Nocturia 11/18/2011  . Impaired glucose tolerance 09/26/2011  . Preventative health care 09/26/2011  . COPD mixed type (HCC) 10/03/2009  . Hyperlipidemia 08/08/2007  . Anxiety state 08/08/2007  . ERECTILE DYSFUNCTION 08/08/2007  . 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 Depression 08/08/2007  . Essential hypertension 08/08/2007  . Allergic rhinitis 08/08/2007  . GERD 08/08/2007  . GENITAL HERPES, HX OF 08/08/2007    Marcelline Mates, PT, DPT 09/05/2020, 12:35 PM  [facility] [address] [place], [place], [address] Phone: [phone]   Fax:  [phone]  Name: Ryan Diaz MRN: [id number] Date of Birth: 04-May-1946

## 2020-09-09 ENCOUNTER — Encounter: Payer: Self-pay | Admitting: Internal Medicine

## 2020-09-10 ENCOUNTER — Ambulatory Visit: Payer: PPO | Admitting: Internal Medicine

## 2020-09-10 ENCOUNTER — Ambulatory Visit: Payer: PPO | Admitting: Physical Therapy

## 2020-09-10 ENCOUNTER — Other Ambulatory Visit: Payer: Self-pay

## 2020-09-10 ENCOUNTER — Encounter: Payer: Self-pay | Admitting: Physical Therapy

## 2020-09-10 DIAGNOSIS — M6281 Muscle weakness (generalized): Secondary | ICD-10-CM

## 2020-09-10 DIAGNOSIS — M25511 Pain in right shoulder: Secondary | ICD-10-CM | POA: Diagnosis not present

## 2020-09-10 DIAGNOSIS — G8929 Other chronic pain: Secondary | ICD-10-CM

## 2020-09-10 NOTE — Progress Notes (Deleted)
HPI male former smoker followed for OSA, insomnia, complicated by ETOH, COPD, GERD, HBP NPSG 10/31/13- AHI 52/hour, desaturation to 83%, body weight 165 pounds Unattended Home Sleep Test 09/02/2016- AHI 9.4/hour, desaturation to 87%, body weight 163 pounds Office Spirometry 01/2019-moderate obstructive airways disease.  FVC 3.6/92%, FEV1 2.0/67%, ratio 1.54, FEF 25-75% 1.9/45% -------------------------------------------------------------------------------------  01/23/20- 74 year old male former smoker followed for OSA, insomnia, complicated by history EtOH, COPD, GERD, HBP CPAP auto 5-20/[employer] Download compliance 77%, AHI 6.9/ hr Body weight today 176 lbs -----f/u COPD/CPAP. Breathing is at patient's baseline. Albuterol hfa, Trelegy 100 Using nasal pillows mask. Reviewed download. Few missed and short nights.  Denies cough, phlegm. Notes some DOE taking out trash can.- not new. Deneis chest pain, palpitation, edema. Trelegy helps- needs refill.  09/10/20- 74 year old male former smoker followed for OSA, insomnia, complicated by history EtOH, COPD, GERD, HBP Albuterol hfa, Trelegy 100 CPAP auto 5-20/[employer]/ [employer] Download- compliance 90%, AHI 6.6/      Pressure is ranging 7.4-12.6      Occ high leak Body weight today- Covid vax- Flu vax- Patient called 10/21 reporting still DOE with chest tightness.asks about PFT. Trelegy not helpful   CXR 01/23/20-  IMPRESSION: No acute disease. No change in mild pulmonary hyperexpansion compatible with COPD.   ROS-see HPI   + = positive Constitutional:    weight loss, night sweats, fevers, chills, +fatigue, lassitude. HEENT:    headaches, difficulty swallowing, tooth/dental problems, sore throat,       sneezing, itching, ear ache, nasal congestion, post nasal drip, snoring CV:    chest pain, orthopnea, PND, swelling in lower extremities, anasarca,                                 dizziness, palpitations Resp:   shortness of breath with exertion  or at rest.                productive cough,   non-productive cough, coughing up of blood.              change in color of mucus.  wheezing.   Skin:    rash or lesions. GI:  No-   heartburn, indigestion, abdominal pain, nausea, vomiting, diarrhea,                 change in bowel habits, loss of appetite GU: dysuria, change in color of urine, no urgency or frequency.   flank pain. MS:   joint pain, stiffness, decreased range of motion, back pain. Neuro-     nothing unusual Psych:  change in mood or affect.  depression or anxiety.   memory loss.  OBJ- Physical Exam                   General- Alert, Oriented, Affect-appropriate, Distress- none acute, + not obese Skin- rash-none, lesions- none, excoriation- none Lymphadenopathy- none Head- atraumatic            Eyes- Gross vision intact, PERRLA, conjunctivae and secretions clear            Ears- Hearing, canals-normal            Nose- Clear, no-Septal dev, mucus, polyps, erosion, perforation             Throat- Mallampati III-IV , mucosa clear , drainage- none, tonsils- atrophic Neck- flexible , trachea midline, no stridor , thyroid nl, carotid no bruit Chest - symmetrical excursion , unlabored  Heart/CV- RRR , no murmur , no gallop  , no rub, nl s1 s2                           - JVD- none , edema- none, stasis changes- none, varices- none           Lung- clear to P&A, wheeze- none, cough- none , dullness-none, rub- none           Chest wall-  Abd-  Br/ Gen/ Rectal- Not done, not indicated Extrem- cyanosis- none, clubbing, none, atrophy- none, strength- nl Neuro- grossly intact to observation

## 2020-09-10 NOTE — Therapy (Signed)
[facility] Outpatient Rehabilitation [facility] [address] [place], [place], [address] Phone: [phone]   Fax:  [phone]  Physical Therapy Treatment  Patient Details  Name: Ryan Diaz MRN: [id number] Date of Birth: October 25, 1946 Referring Provider (PT): Rodolph Bong, MD   Encounter Date: 09/10/2020   PT End of Session - 09/10/20 0938    Visit Number 4    Number of Visits 12    Date for PT Re-Evaluation 10/04/20    Authorization Type [employer]    PT Start Time 0915    PT Stop Time 0957    PT Time Calculation (min) 42 min    Activity Tolerance Patient tolerated treatment well    Behavior During Therapy [facility] for tasks assessed/performed           Past Medical History:  Diagnosis Date  . 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 ALLERGIC RHINITIS 08/08/2007  . ANXIETY 08/08/2007  . Carpal tunnel syndrome   . COPD 10/03/2009  . DEPRESSION 08/08/2007  . ERECTILE DYSFUNCTION 08/08/2007  . GENITAL HERPES, HX OF 08/08/2007  . GERD 08/08/2007  . GLUCOSE INTOLERANCE 08/08/2007  . HYPERLIPIDEMIA 08/08/2007  . HYPERTENSION 08/08/2007  . Impaired glucose tolerance 09/26/2011  . Insomnia 05/18/2012  . PAIN IN SOFT TISSUES OF LIMB 10/02/2009  . Sleep apnea    OSA-uses CPAP nightly    Past Surgical History:  Procedure Laterality Date  . ACHILLES TENDON REPAIR Bilateral   . CARPAL TUNNEL RELEASE Right 08/18/2018   Procedure: RIGHT CARPAL TUNNEL RELEASE;  Surgeon: Betha Loa, MD;  Location: [facility];  Service: Orthopedics;  Laterality: Right;  . CARPAL TUNNEL RELEASE Left 11/21/2018   Procedure: LEFT CARPAL TUNNEL RELEASE;  Surgeon: Betha Loa, MD;  Location: [facility];  Service: Orthopedics;  Laterality: Left;  Bier block  . COLONOSCOPY      There were no vitals filed for this visit.   Subjective Assessment - 09/10/20 0917    Subjective Patient reports his shoulder started hurting last night and has been really  hurting him this morning.    Patient Stated Goals Get shoulder stronger    Currently in Pain? Yes    Pain Score 6     Pain Location Shoulder    Pain Orientation Right    Pain Descriptors / Indicators Aching;Sore    Pain Type Chronic pain    Pain Onset More than a month ago    Pain Frequency Constant              [facility] PT Assessment - 09/10/20 0001      PROM   Overall PROM Comments PROM shoulder elevation and ER grossly WFL and equal bilaterally                          [facility] Adult PT Treatment/Exercise - 09/10/20 0001      Exercises   Exercises Shoulder      Shoulder Exercises: Supine   Flexion 10 reps   2 sets   Flexion Limitations 1st set: dowel press to overhead with 1#, 2nd set AROM full range      Shoulder Exercises: Sidelying   External Rotation 10 reps   2 sets   External Rotation Limitations limitation in range due to weakness      Shoulder Exercises: Standing   Flexion 10 reps   2 sets   Flexion Limitations wall towel slide    Extension 10 reps  2 sets   Theraband Level (Shoulder Extension) Level 1 (Yellow)    Row 10 reps   2 sets   Theraband Level (Shoulder Row) Level 3 (Green)      Manual Therapy   Manual Therapy Joint mobilization;Passive ROM    Joint Mobilization GHJ posterior/inferior mobs in various ranges of elevation    Passive ROM Left shoulder all planes, scap pinned corss body stretching                  PT Education - 09/10/20 0938    Education Details FOTO, HEP, postural control    Person(s) Educated Patient    Methods Explanation;Demonstration;Verbal cues;Handout    Comprehension Verbalized understanding;Returned demonstration;Verbal cues required;Need further instruction            PT Short Term Goals - 09/10/20 0956      PT SHORT TERM GOAL #1   Title Patient will be I with initial HEP to progress with PT    Time 3    Period Weeks    Status New    Target Date 09/13/20      PT SHORT TERM GOAL #2    Title Review FOTO with patient    Time 2    Period Weeks    Status Achieved    Target Date 09/06/20             PT Long Term Goals - 08/23/20 0857      PT LONG TERM GOAL #1   Title Patient will be I with final HEP to maintain progress from PT    Time 6    Period Weeks    Status New    Target Date 10/04/20      PT LONG TERM GOAL #2   Title Patient will demonstrate right shoulder flexion >/= 150 deg to improve reaching into high cabinet    Time 6    Period Weeks    Status New    Target Date 10/04/20      PT LONG TERM GOAL #3   Title Patient will exhibit gross right shoulder strength >/= 4/5 MMT to improve ability to lift objects into cabinet at shoulder height    Time 6    Period Weeks    Status New    Target Date 10/04/20      PT LONG TERM GOAL #4   Title Patient will report improved functional level of </= % on FOTO    Baseline FOTO was assessed but website down to assess score so update goal at next visit    Time 6    Period Weeks    Status New    Target Date 10/04/20                 Plan - 09/10/20 6440    Clinical Impression Statement Patient tolerated therapy well with no adverse effects. Patient demenstrates gross PROM of right shoulder equal to left but continues to have limitation with active motion and exhibits strength deficits of the rotator cuff. Manual therapy used to improve posterior shoulder mobility and reduce pain. Continued progression of periscapular strengthening to improve scapular control and posture. He require consistent cueing to avoid shrug and anterior shoulder roll. He would benefit from continued skilled PT to progress his motion and strength in order to reduce pain and improve overhead function with right arm.    PT Treatment/Interventions ADLs/Self Care Home Management;Cryotherapy;Electrical Stimulation;Iontophoresis 4mg /ml Dexamethasone;Moist Heat;Neuromuscular re-education;Therapeutic exercise;Therapeutic activities;Patient/family  education;Manual techniques;Dry needling;Passive range of  motion;Taping;Spinal Manipulations;Joint Manipulations    PT Next Visit Plan Review HEP and progress PRN, manual and/or modalities for right shoulder pain PRN, progress right shoulder AAROM for elevation, rotator cuff (esp ER) and periscapular strengthening    PT Home Exercise Plan [email or web address]: supine AAROM dowel press, wall towel slide flexion, row with green, extension with yellow; S/L open books, seated T/S ext over chair, standing scap retract + ER at corner    Consulted and Agree with Plan of Care Patient           Patient will benefit from skilled therapeutic intervention in order to improve the following deficits and impairments:  Decreased range of motion, Pain, Decreased activity tolerance, Postural dysfunction, Decreased strength  Visit Diagnosis: Chronic right shoulder pain  Chronic left shoulder pain  Muscle weakness (generalized)     Problem List Patient Active Problem List   Diagnosis Date Noted  . Acute pain of right shoulder 07/27/2020  . Vitamin D deficiency 06/01/2020  . Weight loss 09/19/2019  . Rash 03/27/2019  . Right foot pain 12/08/2018  . Blurred vision, bilateral 10/19/2018  . Bilateral carpal tunnel syndrome 07/11/2018  . Tenosynovitis of hand 07/11/2018  . Right ankle pain 03/09/2018  . Facial rash 10/16/2017  . Bruising 02/11/2017  . Cough 12/16/2016  . Wheezing 12/16/2016  . Memory dysfunction 07/15/2016  . Dizziness 07/10/2015  . Mastoiditis 07/10/2015  . Fatigue 07/10/2015  . Abnormal TSH 07/10/2015  . Pulsatile tinnitus of right ear 01/23/2015  . Renal insufficiency 01/09/2015  . Bilateral ankle pain 04/10/2014  . OSA (obstructive sleep apnea) 09/27/2013  . Right carpal tunnel syndrome 07/06/2013  . Insomnia 05/18/2012  . Nocturia 11/18/2011  . Impaired glucose tolerance 09/26/2011  . Preventative health care 09/26/2011  . COPD mixed type (HCC) 10/03/2009  . Hyperlipidemia  08/08/2007  . Anxiety state 08/08/2007  . ERECTILE DYSFUNCTION 08/08/2007  . 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 Depression 08/08/2007  . Essential hypertension 08/08/2007  . Allergic rhinitis 08/08/2007  . GERD 08/08/2007  . GENITAL HERPES, HX OF 08/08/2007    Rosana Hoes, PT, DPT, LAT, ATC 09/10/20  10:03 AM Phone: [phone] Fax: [phone]   [facility] Outpatient Rehabilitation [facility] [address] [place], [place], [address] Phone: [phone]   Fax:  [phone]  Name: Ryan Diaz MRN: [id number] Date of Birth: 1946-08-15

## 2020-09-12 ENCOUNTER — Ambulatory Visit: Payer: PPO | Admitting: Family Medicine

## 2020-09-17 ENCOUNTER — Ambulatory Visit: Payer: PPO | Attending: Family Medicine | Admitting: Physical Therapy

## 2020-09-17 ENCOUNTER — Encounter: Payer: Self-pay | Admitting: Family Medicine

## 2020-09-17 ENCOUNTER — Encounter: Payer: Self-pay | Admitting: Physical Therapy

## 2020-09-17 ENCOUNTER — Ambulatory Visit (INDEPENDENT_AMBULATORY_CARE_PROVIDER_SITE_OTHER): Payer: PPO | Admitting: Family Medicine

## 2020-09-17 ENCOUNTER — Other Ambulatory Visit: Payer: Self-pay

## 2020-09-17 VITALS — BP 132/86 | HR 94 | Ht 71.0 in | Wt 173.0 lb

## 2020-09-17 DIAGNOSIS — M6281 Muscle weakness (generalized): Secondary | ICD-10-CM | POA: Diagnosis not present

## 2020-09-17 DIAGNOSIS — M25512 Pain in left shoulder: Secondary | ICD-10-CM

## 2020-09-17 DIAGNOSIS — M25511 Pain in right shoulder: Secondary | ICD-10-CM | POA: Diagnosis not present

## 2020-09-17 DIAGNOSIS — G8929 Other chronic pain: Secondary | ICD-10-CM

## 2020-09-17 NOTE — Therapy (Signed)
[facility] Outpatient Rehabilitation [facility] [address] [place], [place], [address] Phone: [phone]   Fax:  [phone]  Physical Therapy Treatment  Patient Details  Name: Ryan Diaz MRN: [id number] Date of Birth: [birth year] Referring Provider (PT): Rodolph Bong, MD   Encounter Date: 09/17/2020   PT End of Session - 09/17/20 0948    Visit Number 5    Number of Visits 12    Date for PT Re-Evaluation 10/04/20    Authorization Type [employer]    PT Start Time 1000    PT Stop Time 1045    PT Time Calculation (min) 45 min    Activity Tolerance Patient tolerated treatment well    Behavior During Therapy [facility] for tasks assessed/performed           Past Medical History:  Diagnosis Date   ABUSE, ALCOHOL, CONTINUOUS 08/08/2007   ALLERGIC RHINITIS 08/08/2007   ANXIETY 08/08/2007   Carpal tunnel syndrome    COPD 10/03/2009   DEPRESSION 08/08/2007   ERECTILE DYSFUNCTION 08/08/2007   GENITAL HERPES, HX OF 08/08/2007   GERD 08/08/2007   GLUCOSE INTOLERANCE 08/08/2007   HYPERLIPIDEMIA 08/08/2007   HYPERTENSION 08/08/2007   Impaired glucose tolerance 09/26/2011   Insomnia 05/18/2012   PAIN IN SOFT TISSUES OF LIMB 10/02/2009   Sleep apnea    OSA-uses CPAP nightly    Past Surgical History:  Procedure Laterality Date   ACHILLES TENDON REPAIR Bilateral    CARPAL TUNNEL RELEASE Right 08/18/2018   Procedure: RIGHT CARPAL TUNNEL RELEASE;  Surgeon: Betha Loa, MD;  Location: [facility];  Service: Orthopedics;  Laterality: Right;   CARPAL TUNNEL RELEASE Left 11/21/2018   Procedure: LEFT CARPAL TUNNEL RELEASE;  Surgeon: Betha Loa, MD;  Location: [facility];  Service: Orthopedics;  Laterality: Left;  Bier block   COLONOSCOPY      There were no vitals filed for this visit.   Subjective Assessment - 09/17/20 0957    Subjective Patient reports he feels his shoulder more in the morning and when he uses it to  reach, but later in the day it does feel better.    Patient Stated Goals Get shoulder stronger    Currently in Pain? Yes    Pain Score 4     Pain Location Shoulder    Pain Orientation Right    Pain Descriptors / Indicators Aching;Sore    Pain Type Chronic pain    Pain Onset More than a month ago    Pain Frequency Intermittent              [facility] PT Assessment - 09/17/20 0001      AROM   Right Shoulder Flexion 120 Degrees      Strength   Right Shoulder Flexion 3-/5    Right Shoulder External Rotation 3-/5                         [facility] Adult PT Treatment/Exercise - 09/17/20 0001      Exercises   Exercises Shoulder      Shoulder Exercises: Supine   Protraction 10 reps   2 sets   Protraction Weight (lbs) dowel with 5#    Flexion 5 reps   2 sets   Flexion Limitations dowel press to overhead stretch with 5#      Shoulder Exercises: Prone   Extension 10 reps   2 sets   Extension Limitations focus on scapular retraction and depression  Horizontal ABduction 1 10 reps   2 sets   Horizontal ABduction 1 Limitations focus on scapular retraction      Shoulder Exercises: Sidelying   External Rotation 10 reps   2 sets   External Rotation Limitations limitation in range due to weakness and pain    Flexion 10 reps   2 sets   Flexion Limitations Scaption      Shoulder Exercises: Standing   Flexion 10 reps;AAROM   2 sets   Flexion Limitations UE Ranger    Row 15 reps   2 sets   Theraband Level (Shoulder Row) Level 3 (Green)      Shoulder Exercises: ROM/Strengthening   UBE (Upper Arm Bike) L1 x 4 min (2 fwd/bwd)                  PT Education - 09/17/20 0947    Education Details HEP, posture    Person(s) Educated Patient    Methods Explanation;Demonstration;Verbal cues    Comprehension Returned demonstration;Verbal cues required;Verbalized understanding;Need further instruction            PT Short Term Goals - 09/17/20 1023      PT SHORT TERM  GOAL #1   Title Patient will be I with initial HEP to progress with PT    Time 3    Period Weeks    Status On-going    Target Date 09/13/20      PT SHORT TERM GOAL #2   Title Review FOTO with patient    Time 2    Period Weeks    Status Achieved    Target Date 09/06/20             PT Long Term Goals - 08/23/20 0857      PT LONG TERM GOAL #1   Title Patient will be I with final HEP to maintain progress from PT    Time 6    Period Weeks    Status New    Target Date 10/04/20      PT LONG TERM GOAL #2   Title Patient will demonstrate right shoulder flexion >/= 150 deg to improve reaching into high cabinet    Time 6    Period Weeks    Status New    Target Date 10/04/20      PT LONG TERM GOAL #3   Title Patient will exhibit gross right shoulder strength >/= 4/5 MMT to improve ability to lift objects into cabinet at shoulder height    Time 6    Period Weeks    Status New    Target Date 10/04/20      PT LONG TERM GOAL #4   Title Patient will report improved functional level of </= % on FOTO    Baseline FOTO was assessed but website down to assess score so update goal at next visit    Time 6    Period Weeks    Status New    Target Date 10/04/20                 Plan - 09/17/20 0950    Clinical Impression Statement Patient tolerated therapy well with no adverse effects. He continues to demonstrate deficits with active motion and rotator cuff strength with postural deficits so therapy focused on addressing these limitations. He does continue to require cueing for posture with exercises to avoid scapular shrug. Progressed gravity reduced exercises and further periscapular strengthening. His symptoms continue to seem consistent with rotator  cuff injury. He would benefit from continued skilled PT to progress his motion and strength in order to reduce pain and improve overhead function with right arm.    PT Treatment/Interventions ADLs/Self Care Home  Management;Cryotherapy;Electrical Stimulation;Iontophoresis 4mg /ml Dexamethasone;Moist Heat;Neuromuscular re-education;Therapeutic exercise;Therapeutic activities;Patient/family education;Manual techniques;Dry needling;Passive range of motion;Taping;Spinal Manipulations;Joint Manipulations    PT Next Visit Plan Review HEP and progress PRN, manual and/or modalities for right shoulder pain PRN, progress right shoulder AAROM for elevation, rotator cuff (esp ER) and periscapular strengthening    PT Home Exercise Plan [email or web address]: supine AAROM dowel press, wall towel slide flexion, row with green, extension with yellow; S/L open books, seated T/S ext over chair, standing scap retract + ER at corner    Consulted and Agree with Plan of Care Patient           Patient will benefit from skilled therapeutic intervention in order to improve the following deficits and impairments:  Decreased range of motion, Pain, Decreased activity tolerance, Postural dysfunction, Decreased strength  Visit Diagnosis: Chronic right shoulder pain  Chronic left shoulder pain  Muscle weakness (generalized)     Problem List Patient Active Problem List   Diagnosis Date Noted   Acute pain of right shoulder 07/27/2020   Vitamin D deficiency 06/01/2020   Weight loss 09/19/2019   Rash 03/27/2019   Right foot pain 12/08/2018   Blurred vision, bilateral 10/19/2018   Bilateral carpal tunnel syndrome 07/11/2018   Tenosynovitis of hand 07/11/2018   Right ankle pain 03/09/2018   Facial rash 10/16/2017   Bruising 02/11/2017   Cough 12/16/2016   Wheezing 12/16/2016   Memory dysfunction 07/15/2016   Dizziness 07/10/2015   Mastoiditis 07/10/2015   Fatigue 07/10/2015   Abnormal TSH 07/10/2015   Pulsatile tinnitus of right ear 01/23/2015   Renal insufficiency 01/09/2015   Bilateral ankle pain 04/10/2014   OSA (obstructive sleep apnea) 09/27/2013   Right carpal tunnel syndrome 07/06/2013   Insomnia  05/18/2012   Nocturia 11/18/2011   Impaired glucose tolerance 09/26/2011   Preventative health care 09/26/2011   COPD mixed type (HCC) 10/03/2009   Hyperlipidemia 08/08/2007   Anxiety state 08/08/2007   ERECTILE DYSFUNCTION 08/08/2007   ABUSE, ALCOHOL, CONTINUOUS 08/08/2007   Depression 08/08/2007   Essential hypertension 08/08/2007   Allergic rhinitis 08/08/2007   GERD 08/08/2007   GENITAL HERPES, HX OF 08/08/2007    08/10/2007, PT, DPT, LAT, ATC 09/17/20  10:47 AM Phone: [phone] Fax: [phone]   [facility] Outpatient Rehabilitation [facility] [address] [place], [place], [place] Phone: [phone]   Fax:  [phone]  Name: RICHRD KUZNIAR MRN: Irven Coe Date of Birth: [birth year]

## 2020-09-17 NOTE — Progress Notes (Signed)
   Wynema Birch, am serving as a Neurosurgeon for Dr. Clementeen Graham.  Ryan Diaz is a 74 y.o. male who presents to [facility] today for f/u of B shoulder pain, R>L.  He was last seen by Dr. Denyse Amass on 08/01/20 and was referred to PT of which he has completed 4 visits.  He was also prescribed Meloxicam and advised to use Voltaren gel.  Since his last visit, pt reports that L shoulder is actually better has not had nay pain. The R shoulder he is still having pain states worse in the morning after he has been laying on it. States did not get the voltaren Gel yet and states was told not to take Meloxicam long term wanting to know if he can do short bursts of it as it helps with the pain and swelling. States not sure if the PT has been helping or not.   Diagnostic testing: R shoulder XR- 07/26/20   Pertinent review of systems: No fevers or chills  Relevant historical information: Hypertension, COPD   Exam:  BP 132/86 (BP Location: Left Arm, Patient Position: Sitting, Cuff Size: Normal)   Pulse 94   Ht 5\' 11"  (1.803 m)   Wt 173 lb (78.5 kg)   SpO2 96%   BMI 24.13 kg/m  General: Well Developed, well nourished, and in no acute distress.   MSK: C-spine normal-appearing nontender normal cervical motion. Right shoulder normal-appearing decreased abduction range of motion with pain. Decrease strength abduction. Positive Hawkins and Neer's test. Left shoulder normal-appearing nontender normal motion normal strength    Lab and Radiology Results EXAM: RIGHT SHOULDER - 2+ VIEW  COMPARISON:  None.  FINDINGS: There is no evidence of fracture or dislocation. Mild degenerative changes seen involving the right acromioclavicular joint. Soft tissues are unremarkable.  IMPRESSION: Mild degenerative changes involving the right acromioclavicular joint.   Electronically Signed   By: M.D.   On: 07/27/2020 22:28 I, 09/26/2020, personally  (independently) visualized and performed the interpretation of the images attached in this note.     Assessment and Plan: 74 y.o. male with right shoulder pain somewhat improved with physical therapy but still quite symptomatic.  Discussed options.  At this point he has had at least 6 weeks of conservative management with not enough benefit and is still having significant limitations in his normal activities.  Plan for MRI to further characterize cause of pain and for potential surgical or injection planning.  Recheck after MRI.   PDMP not reviewed this encounter. Orders Placed This Encounter  Procedures  . MR SHOULDER RIGHT WO CONTRAST    Standing Status:   Future    Standing Expiration Date:   09/17/2021    Order Specific Question:   What is the patient's sedation requirement?    Answer:   No Sedation    Order Specific Question:   Does the patient have a pacemaker or implanted devices?    Answer:   No    Order Specific Question:   Preferred imaging location?    Answer:   13/12/2020 (table limit-350lbs)   No orders of the defined types were placed in this encounter.    Discussed warning signs or symptoms. Please see discharge instructions. Patient expresses understanding.   The above documentation has been reviewed and is accurate and complete Licensed conveyancer, M.D.

## 2020-09-17 NOTE — Patient Instructions (Signed)
Thank you for coming in today.  You should hear from MRI scheduling within 1 week. If you do not hear please let me know.   Recheck following MRI.   Let me know if you have a problem.    

## 2020-09-19 ENCOUNTER — Ambulatory Visit: Payer: PPO | Admitting: Physical Therapy

## 2020-09-19 ENCOUNTER — Other Ambulatory Visit: Payer: Self-pay

## 2020-09-19 ENCOUNTER — Encounter: Payer: Self-pay | Admitting: Physical Therapy

## 2020-09-19 DIAGNOSIS — M25511 Pain in right shoulder: Secondary | ICD-10-CM | POA: Diagnosis not present

## 2020-09-19 DIAGNOSIS — M6281 Muscle weakness (generalized): Secondary | ICD-10-CM

## 2020-09-19 DIAGNOSIS — G8929 Other chronic pain: Secondary | ICD-10-CM

## 2020-09-19 NOTE — Patient Instructions (Signed)
Access Code: [email or web address] URL: [email or web address] Date: 09/19/2020 Prepared by: Rosana Hoes  Exercises Supine Shoulder Press with Dowel - 2 x daily - 7 x weekly - 2 sets - 10 reps Shoulder Flexion Wall Slide with Towel - 2 x daily - 7 x weekly - 2 sets - 10 reps Banded Row - 2 x daily - 7 x weekly - 2 sets - 10 reps Scapular Retraction with Resistance Advanced - 2 x daily - 7 x weekly - 2 sets - 10 reps Sidelying Shoulder Flexion 15 Degrees - 1-2 x daily - 7 x weekly - 2 sets - 10 reps Sidelying Shoulder External Rotation AROM - 1-2 x daily - 7 x weekly - 2 sets - 10 reps

## 2020-09-19 NOTE — Therapy (Signed)
[facility] [address] [place], [place], [address] Phone: [phone]   Fax:  [phone]  Physical Therapy Treatment  Patient Details  Name: Ryan Diaz MRN: [id number] Date of Birth: 10/05/1946 Referring Provider (PT): Rodolph Bong, MD   Encounter Date: 09/19/2020   PT End of Session - 09/19/20 0927    Visit Number 6    Number of Visits 12    Date for PT Re-Evaluation 10/04/20    Authorization Type [employer]    PT Start Time 0915    PT Stop Time 1000    PT Time Calculation (min) 45 min    Activity Tolerance Patient tolerated treatment well    Behavior During Therapy [facility] for tasks assessed/performed           Past Medical History:  Diagnosis Date  . 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 ALLERGIC RHINITIS 08/08/2007  . ANXIETY 08/08/2007  . Carpal tunnel syndrome   . COPD 10/03/2009  . DEPRESSION 08/08/2007  . ERECTILE DYSFUNCTION 08/08/2007  . GENITAL HERPES, HX OF 08/08/2007  . GERD 08/08/2007  . GLUCOSE INTOLERANCE 08/08/2007  . HYPERLIPIDEMIA 08/08/2007  . HYPERTENSION 08/08/2007  . Impaired glucose tolerance 09/26/2011  . Insomnia 05/18/2012  . PAIN IN SOFT TISSUES OF LIMB 10/02/2009  . Sleep apnea    OSA-uses CPAP nightly    Past Surgical History:  Procedure Laterality Date  . ACHILLES TENDON REPAIR Bilateral   . CARPAL TUNNEL RELEASE Right 08/18/2018   Procedure: RIGHT CARPAL TUNNEL RELEASE;  Surgeon: Betha Loa, MD;  Location: [facility];  Service: Orthopedics;  Laterality: Right;  . CARPAL TUNNEL RELEASE Left 11/21/2018   Procedure: LEFT CARPAL TUNNEL RELEASE;  Surgeon: Betha Loa, MD;  Location: [facility];  Service: Orthopedics;  Laterality: Left;  Bier block  . COLONOSCOPY      There were no vitals filed for this visit.   Subjective Assessment - 09/19/20 0925    Subjective Patient reports right shoulder continues to bother him, he is going to have an MRI  scheduled for right shoulder    Patient Stated Goals Get shoulder stronger    Currently in Pain? Yes    Pain Score 3     Pain Location Shoulder    Pain Orientation Right    Pain Descriptors / Indicators Aching    Pain Type Chronic pain    Pain Onset More than a month ago    Pain Frequency Intermittent              [facility] PT Assessment - 09/19/20 0001      Assessment   Medical Diagnosis Chronic pain of both shoulders    Referring Provider (PT) Rodolph Bong, MD      Observation/Other Assessments   Focus on Therapeutic Outcomes (FOTO)  53% functional status   initial: 52% functional status, predicted: 65% functional st     AROM   Right Shoulder Flexion 120 Degrees   shrug noted     PROM   Overall PROM Comments PROM shoulder elevation equal bilaterally                          [facility] Adult PT Treatment/Exercise - 09/19/20 0001      Shoulder Exercises: Supine   Protraction 10 reps   2 sets   Protraction Weight (lbs) dowel press with plus using  5#    Flexion 10 reps   2  sets   Flexion Limitations scaption 90-150 deg with yellow band      Shoulder Exercises: Sidelying   External Rotation 10 reps   2 sets   External Rotation Limitations improved range this visit    Flexion 10 reps   2 sets   Flexion Limitations scaption      Shoulder Exercises: Standing   Flexion 15 reps   2 sets   Flexion Limitations wall slide with 1# weight on wrist    Extension 15 reps   2 sets   Theraband Level (Shoulder Extension) Level 2 (Red)    Row 20 reps   2 sets   Theraband Level (Shoulder Row) Level 4 (Blue)    Shoulder Elevation 10 reps    Shoulder Elevation Limitations scaption to 90 deg with 1#      Shoulder Exercises: ROM/Strengthening   UBE (Upper Arm Bike) L1 x 4 min (2 fwd/bwd)                  PT Education - 09/19/20 0926    Education Details FOTO, HEP, posture    Person(s) Educated Patient    Methods Explanation;Demonstration;Verbal cues     Comprehension Verbalized understanding;Need further instruction;Returned demonstration;Verbal cues required            PT Short Term Goals - 09/17/20 1023      PT SHORT TERM GOAL #1   Title Patient will be I with initial HEP to progress with PT    Time 3    Period Weeks    Status On-going    Target Date 09/13/20      PT SHORT TERM GOAL #2   Title Review FOTO with patient    Time 2    Period Weeks    Status Achieved    Target Date 09/06/20             PT Long Term Goals - 09/19/20 0957      PT LONG TERM GOAL #1   Title Patient will be I with final HEP to maintain progress from PT    Time 6    Period Weeks    Status New    Target Date 10/04/20      PT LONG TERM GOAL #2   Title Patient will demonstrate right shoulder flexion >/= 150 deg to improve reaching into high cabinet    Time 6    Period Weeks    Status New    Target Date 10/04/20      PT LONG TERM GOAL #3   Title Patient will exhibit gross right shoulder strength >/= 4/5 MMT to improve ability to lift objects into cabinet at shoulder height    Time 6    Period Weeks    Status New    Target Date 10/04/20      PT LONG TERM GOAL #4   Title Patient will report improved functional level of >/= 65% functional status on FOTO    Baseline Initial: 52%, current 09/19/2020 53%    Time 6    Period Weeks    Status On-going    Target Date 10/04/20                 Plan - 09/19/20 4270    Clinical Impression Statement Patient tolerated therapy well with no adverse effects. Therapy continued focus on progress strength and mobility of right shoulder. He is progressing with strengthening exercises but continues to demonstrate deficits with active motion against gravity with  obvious shrug especially when loaded. He continues to require cueing for posture with exercises and is able to correct when cued. He would benefit from continued skilled PT to progress his motion and strength in order to reduce pain and improve  overhead function with right arm.    PT Treatment/Interventions ADLs/Self Care Home Management;Cryotherapy;Electrical Stimulation;Iontophoresis 4mg /ml Dexamethasone;Moist Heat;Neuromuscular re-education;Therapeutic exercise;Therapeutic activities;Patient/family education;Manual techniques;Dry needling;Passive range of motion;Taping;Spinal Manipulations;Joint Manipulations    PT Next Visit Plan Review HEP and progress PRN, manual and/or modalities for right shoulder pain PRN, progress right shoulder AAROM for elevation, rotator cuff (esp ER) and periscapular strengthening    PT Home Exercise Plan [id number]: supine AAROM dowel press, wall towel slide flexion, row with green, extension with yellow, sidelying flexion and ER; S/L open books, seated T/S ext over chair, standing scap retract + ER at corner    Consulted and Agree with Plan of Care Patient           Patient will benefit from skilled therapeutic intervention in order to improve the following deficits and impairments:  Decreased range of motion, Pain, Decreased activity tolerance, Postural dysfunction, Decreased strength  Visit Diagnosis: Chronic right shoulder pain  Chronic left shoulder pain  Muscle weakness (generalized)     Problem List Patient Active Problem List   Diagnosis Date Noted  . Acute pain of right shoulder 07/27/2020  . Vitamin D deficiency 06/01/2020  . Weight loss 09/19/2019  . Rash 03/27/2019  . Right foot pain 12/08/2018  . Blurred vision, bilateral 10/19/2018  . Bilateral carpal tunnel syndrome 07/11/2018  . Tenosynovitis of hand 07/11/2018  . Right ankle pain 03/09/2018  . Facial rash 10/16/2017  . Bruising 02/11/2017  . Cough 12/16/2016  . Wheezing 12/16/2016  . Memory dysfunction 07/15/2016  . Dizziness 07/10/2015  . Mastoiditis 07/10/2015  . Fatigue 07/10/2015  . Abnormal TSH 07/10/2015  . Pulsatile tinnitus of right ear 01/23/2015  . Renal insufficiency 01/09/2015  . Bilateral ankle pain  04/10/2014  . OSA (obstructive sleep apnea) 09/27/2013  . Right carpal tunnel syndrome 07/06/2013  . Insomnia 05/18/2012  . Nocturia 11/18/2011  . Impaired glucose tolerance 09/26/2011  . Preventative health care 09/26/2011  . COPD mixed type (HCC) 10/03/2009  . Hyperlipidemia 08/08/2007  . Anxiety state 08/08/2007  . ERECTILE DYSFUNCTION 08/08/2007  . 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 Depression 08/08/2007  . Essential hypertension 08/08/2007  . Allergic rhinitis 08/08/2007  . GERD 08/08/2007  . GENITAL HERPES, HX OF 08/08/2007    08/10/2007, PT, DPT, LAT, ATC 09/19/20  10:00 AM Phone: [phone] Fax: [phone]   [facility] [address] [place], [place], [place] Phone: [phone]   Fax:  [phone]  Name: Ryan Diaz MRN: Irven Coe Date of Birth: Jul 23, 1946

## 2020-09-22 ENCOUNTER — Other Ambulatory Visit: Payer: Self-pay

## 2020-09-22 ENCOUNTER — Ambulatory Visit (INDEPENDENT_AMBULATORY_CARE_PROVIDER_SITE_OTHER): Payer: PPO

## 2020-09-22 DIAGNOSIS — G8929 Other chronic pain: Secondary | ICD-10-CM

## 2020-09-22 DIAGNOSIS — M25511 Pain in right shoulder: Secondary | ICD-10-CM | POA: Diagnosis not present

## 2020-09-22 DIAGNOSIS — S46011A Strain of muscle(s) and tendon(s) of the rotator cuff of right shoulder, initial encounter: Secondary | ICD-10-CM | POA: Diagnosis not present

## 2020-09-22 NOTE — Progress Notes (Signed)
HPI male former smoker followed for OSA, insomnia, complicated by ETOH, COPD, GERD, HBP NPSG 10/31/13- AHI 52/hour, desaturation to 83%, body weight 165 pounds Unattended Home Sleep Test 09/02/2016- AHI 9.4/hour, desaturation to 87%, body weight 163 pounds Office Spirometry 01/2019-moderate obstructive airways disease.  FVC 3.6/92%, FEV1 2.0/67%, ratio 1.54, FEF 25-75% 1.9/45% -------------------------------------------------------------------------------------   01/23/20- 74 year old male former smoker followed for OSA, insomnia, complicated by history EtOH, COPD, GERD, HBP CPAP auto 5-20/[employer] Download compliance 77%, AHI 6.9/ hr Body weight today 176 lbs -----f/u COPD/CPAP. Breathing is at patient's baseline. Albuterol hfa, Trelegy 100 Using nasal pillows mask. Reviewed download. Few missed and short nights.  Denies cough, phlegm. Notes some DOE taking out trash can.- not new. Deneis chest pain, palpitation, edema. Trelegy helps- needs refill.  09/23/20- 74 year old male former smoker followed for OSA, insomnia, complicated by history EtOH, COPD, GERD, HBP CPAP auto 5-20/[employer] Albuterol hfa, Trelegy 100 Download- compliance 83%, AHI 7/ hr Body weight today- Covid vax- 3 [employer] Flu vax-had Notices easy DOE around home, pulling trash cans, etc. Describes burning feeling across anterior chest with this. Denies palpitation, edema, cough or wheeze as he continues Trelegy.  [facility] did low dose CT chest for cancer screening, told him no nodules. CXR 01/23/20- IMPRESSION: No acute disease. No change in mild pulmonary hyperexpansion compatible with COPD.  ROS-see HPI   + = positive Constitutional:    weight loss, night sweats, fevers, chills, +fatigue, lassitude. HEENT:    headaches, difficulty swallowing, tooth/dental problems, sore throat,       sneezing, itching, ear ache, nasal congestion, post nasal drip, snoring CV:    +chest pain, orthopnea, PND, swelling in lower extremities,  anasarca,                                 dizziness, palpitations Resp:   +shortness of breath with exertion or at rest.                productive cough,   non-productive cough, coughing up of blood.              change in color of mucus.  wheezing.   Skin:    rash or lesions. GI:  No-   heartburn, indigestion, abdominal pain, nausea, vomiting, diarrhea,                 change in bowel habits, loss of appetite GU: dysuria, change in color of urine, no urgency or frequency.   flank pain. MS:   joint pain, stiffness, decreased range of motion, back pain. Neuro-     nothing unusual Psych:  change in mood or affect.  depression or anxiety.   memory loss.  OBJ- Physical Exam                   General- Alert, Oriented, Affect-appropriate, Distress- none acute, + not obese Skin- rash-none, lesions- none, excoriation- none Lymphadenopathy- none Head- atraumatic            Eyes- Gross vision intact, PERRLA, conjunctivae and secretions clear            Ears- Hearing, canals-normal            Nose- Clear, no-Septal dev, mucus, polyps, erosion, perforation             Throat- Mallampati III-IV , mucosa clear , drainage- none, tonsils- atrophic Neck- flexible , trachea midline, no stridor , thyroid nl, carotid  no bruit Chest - symmetrical excursion , unlabored           Heart/CV- RRR , no murmur , no gallop  , no rub, nl s1 s2                           - JVD- none , edema- none, stasis changes- none, varices- none           Lung- clear to P&A, wheeze- none, cough- none , dullness-none, rub- none           Chest wall-  Abd-  Br/ Gen/ Rectal- Not done, not indicated Extrem- cyanosis- none, clubbing, none, atrophy- none, strength- nl Neuro- grossly intact to observation

## 2020-09-23 ENCOUNTER — Encounter: Payer: Self-pay | Admitting: Internal Medicine

## 2020-09-23 ENCOUNTER — Ambulatory Visit (INDEPENDENT_AMBULATORY_CARE_PROVIDER_SITE_OTHER): Payer: PPO | Admitting: Internal Medicine

## 2020-09-23 VITALS — BP 138/80 | HR 84 | Temp 97.4°F | Ht 71.0 in | Wt 172.4 lb

## 2020-09-23 DIAGNOSIS — J441 Chronic obstructive pulmonary disease with (acute) exacerbation: Secondary | ICD-10-CM | POA: Diagnosis not present

## 2020-09-23 DIAGNOSIS — J449 Chronic obstructive pulmonary disease, unspecified: Secondary | ICD-10-CM | POA: Diagnosis not present

## 2020-09-23 DIAGNOSIS — G4733 Obstructive sleep apnea (adult) (pediatric): Secondary | ICD-10-CM

## 2020-09-23 DIAGNOSIS — R079 Chest pain, unspecified: Secondary | ICD-10-CM | POA: Diagnosis not present

## 2020-09-23 NOTE — Assessment & Plan Note (Signed)
Satisfactory compliance and control Plan- continue CPAP auto 5-20

## 2020-09-23 NOTE — Assessment & Plan Note (Signed)
Mainly dyspnea now w/o bronchitis symptoms. I am concerned about "burning" in chest he associates with DOE. Possible angina. Plan- Schedule PFT, refer to [facility] for risk assessment.

## 2020-09-23 NOTE — Progress Notes (Signed)
MRI shoulder shows a rotator cuff tear with some retraction.  This may require surgery.  It additionally shows some tendinitis of the biceps tendon and some arthritis of the shoulder joint itself.  Schedule follow-up appoint with me in the near future to go over the results in full detail and discuss next steps in treatment plan.

## 2020-09-23 NOTE — Patient Instructions (Addendum)
Order- schedule PFT dx COPD  Order- referral to [facility]   Dx exertional chest pain  We can keep the March, 2022 appointment  Please call if we can help

## 2020-09-24 ENCOUNTER — Encounter: Payer: Self-pay | Admitting: Physical Therapy

## 2020-09-24 ENCOUNTER — Other Ambulatory Visit: Payer: Self-pay

## 2020-09-24 ENCOUNTER — Ambulatory Visit: Payer: PPO | Admitting: Physical Therapy

## 2020-09-24 DIAGNOSIS — M25512 Pain in left shoulder: Secondary | ICD-10-CM

## 2020-09-24 DIAGNOSIS — M6281 Muscle weakness (generalized): Secondary | ICD-10-CM

## 2020-09-24 DIAGNOSIS — M25511 Pain in right shoulder: Secondary | ICD-10-CM | POA: Diagnosis not present

## 2020-09-24 DIAGNOSIS — G8929 Other chronic pain: Secondary | ICD-10-CM

## 2020-09-24 NOTE — Therapy (Signed)
[facility] [address] [place], [place], [address] Phone: [phone]   Fax:  [phone]  Physical Therapy Treatment  Patient Details  Name: Ryan Diaz MRN: [id number] Date of Birth: Oct 01, 1946 Referring Provider (PT): Rodolph Bong, MD   Encounter Date: 09/24/2020   PT End of Session - 09/24/20 1009    Visit Number 7    Number of Visits 12    Date for PT Re-Evaluation 10/04/20    Authorization Type [employer]    PT Start Time 1000    PT Stop Time 1040    PT Time Calculation (min) 40 min    Activity Tolerance Patient tolerated treatment well    Behavior During Therapy [facility] for tasks assessed/performed           Past Medical History:  Diagnosis Date  . 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 ALLERGIC RHINITIS 08/08/2007  . ANXIETY 08/08/2007  . Carpal tunnel syndrome   . COPD 10/03/2009  . DEPRESSION 08/08/2007  . ERECTILE DYSFUNCTION 08/08/2007  . GENITAL HERPES, HX OF 08/08/2007  . GERD 08/08/2007  . GLUCOSE INTOLERANCE 08/08/2007  . HYPERLIPIDEMIA 08/08/2007  . HYPERTENSION 08/08/2007  . Impaired glucose tolerance 09/26/2011  . Insomnia 05/18/2012  . PAIN IN SOFT TISSUES OF LIMB 10/02/2009  . Sleep apnea    OSA-uses CPAP nightly    Past Surgical History:  Procedure Laterality Date  . ACHILLES TENDON REPAIR Bilateral   . CARPAL TUNNEL RELEASE Right 08/18/2018   Procedure: RIGHT CARPAL TUNNEL RELEASE;  Surgeon: Betha Loa, MD;  Location: [facility];  Service: Orthopedics;  Laterality: Right;  . CARPAL TUNNEL RELEASE Left 11/21/2018   Procedure: LEFT CARPAL TUNNEL RELEASE;  Surgeon: Betha Loa, MD;  Location: [facility];  Service: Orthopedics;  Laterality: Left;  Bier block  . COLONOSCOPY      There were no vitals filed for this visit.   Subjective Assessment - 09/24/20 1007    Subjective Patient reports right shoulder hurts today. He had an MRI this past Sunday and is  awaiting the doctor to call him for results.    Patient Stated Goals Get shoulder stronger    Currently in Pain? Yes    Pain Score 8     Pain Location Shoulder    Pain Orientation Right    Pain Descriptors / Indicators Aching;Sore    Pain Onset More than a month ago    Pain Frequency Intermittent              [facility] PT Assessment - 09/24/20 0001      Assessment   Medical Diagnosis Chronic pain of both shoulders    Referring Provider (PT) Rodolph Bong, MD      AROM   Right Shoulder Flexion 120 Degrees   shrug noted                        [facility] Adult PT Treatment/Exercise - 09/24/20 0001      Exercises   Exercises Shoulder      Shoulder Exercises: Supine   Protraction 10 reps   2 sets   Protraction Weight (lbs) dowel press with plus    Flexion 10 reps   2 sets   Flexion Limitations dowel press with overhead stretch      Shoulder Exercises: Standing   Flexion 15 reps   2 sets   Flexion Limitations towel wall slide    Extension 20 reps  2 sets   Theraband Level (Shoulder Extension) Level 3 (Green)    Row 20 reps   2 sets   Theraband Level (Shoulder Row) Level 4 (Blue)      Shoulder Exercises: Stretch   Other Shoulder Stretches Sidelying open book stretch 10 x 5 sec hold each      Manual Therapy   Manual Therapy Joint mobilization;Passive ROM    Joint Mobilization GHJ posterior/inferior mobs in various ranges of elevation to reduce pain    Passive ROM Right shoulder all planes                  PT Education - 09/24/20 1008    Education Details HEP    Person(s) Educated Patient    Methods Explanation    Comprehension Verbalized understanding;Need further instruction            PT Short Term Goals - 09/17/20 1023      PT SHORT TERM GOAL #1   Title Patient will be I with initial HEP to progress with PT    Time 3    Period Weeks    Status On-going    Target Date 09/13/20      PT SHORT TERM GOAL #2   Title Review FOTO with patient     Time 2    Period Weeks    Status Achieved    Target Date 09/06/20             PT Long Term Goals - 09/19/20 0957      PT LONG TERM GOAL #1   Title Patient will be I with final HEP to maintain progress from PT    Time 6    Period Weeks    Status New    Target Date 10/04/20      PT LONG TERM GOAL #2   Title Patient will demonstrate right shoulder flexion >/= 150 deg to improve reaching into high cabinet    Time 6    Period Weeks    Status New    Target Date 10/04/20      PT LONG TERM GOAL #3   Title Patient will exhibit gross right shoulder strength >/= 4/5 MMT to improve ability to lift objects into cabinet at shoulder height    Time 6    Period Weeks    Status New    Target Date 10/04/20      PT LONG TERM GOAL #4   Title Patient will report improved functional level of >/= 65% functional status on FOTO    Baseline Initial: 52%, current 09/19/2020 53%    Time 6    Period Weeks    Status On-going    Target Date 10/04/20                 Plan - 09/24/20 1009    Clinical Impression Statement Patient tolerated therapy well with no adverse effects. Patient did report increased right shoulder pain so majority of therapy focused on progressing periscapular strength and AAROM for shoulder to avoid further shoulder pain. Patient did have an MRI and is awaiting a call from doctor for the results and any follow-up information. His symptoms continue to seem consistent with rotator cuff injury. He continues to require cueing for posture with exercises and is able to correct when cued. He would benefit from continued skilled PT to progress his motion and strength in order to reduce pain and improve overhead function with right arm.    PT Treatment/Interventions  ADLs/Self Care Home Management;Cryotherapy;Electrical Stimulation;Iontophoresis 4mg /ml Dexamethasone;Moist Heat;Neuromuscular re-education;Therapeutic exercise;Therapeutic activities;Patient/family education;Manual  techniques;Dry needling;Passive range of motion;Taping;Spinal Manipulations;Joint Manipulations    PT Next Visit Plan Review HEP and progress PRN, manual and/or modalities for right shoulder pain PRN, progress right shoulder AAROM for elevation, rotator cuff (esp ER) and periscapular strengthening    PT Home Exercise Plan [id number]: supine AAROM dowel press, wall towel slide flexion, row with green, extension with yellow, sidelying flexion and ER; S/L open books, seated T/S ext over chair, standing scap retract + ER at corner    Consulted and Agree with Plan of Care Patient           Patient will benefit from skilled therapeutic intervention in order to improve the following deficits and impairments:  Decreased range of motion, Pain, Decreased activity tolerance, Postural dysfunction, Decreased strength  Visit Diagnosis: Chronic right shoulder pain  Chronic left shoulder pain  Muscle weakness (generalized)     Problem List Patient Active Problem List   Diagnosis Date Noted  . Acute pain of right shoulder 07/27/2020  . Vitamin D deficiency 06/01/2020  . Weight loss 09/19/2019  . Rash 03/27/2019  . Right foot pain 12/08/2018  . Blurred vision, bilateral 10/19/2018  . Bilateral carpal tunnel syndrome 07/11/2018  . Tenosynovitis of hand 07/11/2018  . Right ankle pain 03/09/2018  . Facial rash 10/16/2017  . Bruising 02/11/2017  . Cough 12/16/2016  . Wheezing 12/16/2016  . Memory dysfunction 07/15/2016  . Dizziness 07/10/2015  . Mastoiditis 07/10/2015  . Fatigue 07/10/2015  . Abnormal TSH 07/10/2015  . Pulsatile tinnitus of right ear 01/23/2015  . Renal insufficiency 01/09/2015  . Bilateral ankle pain 04/10/2014  . OSA (obstructive sleep apnea) 09/27/2013  . Right carpal tunnel syndrome 07/06/2013  . Insomnia 05/18/2012  . Nocturia 11/18/2011  . Impaired glucose tolerance 09/26/2011  . Preventative health care 09/26/2011  . COPD mixed type (HCC) 10/03/2009  .  Hyperlipidemia 08/08/2007  . Anxiety state 08/08/2007  . ERECTILE DYSFUNCTION 08/08/2007  . 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 Depression 08/08/2007  . Essential hypertension 08/08/2007  . Allergic rhinitis 08/08/2007  . GERD 08/08/2007  . GENITAL HERPES, HX OF 08/08/2007    08/10/2007, PT, DPT, LAT, ATC 09/24/20  10:40 AM Phone: [phone] Fax: [phone]   [facility] [address] [place], [place], [place] Phone: [phone]   Fax:  [phone]  Name: Ryan Diaz MRN: Irven Coe Date of Birth: [birth year]

## 2020-09-26 ENCOUNTER — Other Ambulatory Visit: Payer: Self-pay

## 2020-09-26 ENCOUNTER — Ambulatory Visit: Payer: PPO | Admitting: Physical Therapy

## 2020-09-26 ENCOUNTER — Encounter: Payer: Self-pay | Admitting: Physical Therapy

## 2020-09-26 DIAGNOSIS — M25512 Pain in left shoulder: Secondary | ICD-10-CM

## 2020-09-26 DIAGNOSIS — M6281 Muscle weakness (generalized): Secondary | ICD-10-CM

## 2020-09-26 DIAGNOSIS — M25511 Pain in right shoulder: Secondary | ICD-10-CM | POA: Diagnosis not present

## 2020-09-26 DIAGNOSIS — G8929 Other chronic pain: Secondary | ICD-10-CM

## 2020-09-26 NOTE — Progress Notes (Signed)
I, Philbert Riser, LAT, ATC acting as a scribe for Clementeen Graham, MD.  Ryan Diaz is a 74 y.o. male who presents to [facility] at [facility] today for f/u bilat shoulder pain, R>L, and R shoulder MRI review. Pt was last seen by Dr. Denyse Amass on 09/17/20 and advised to get MRI and follow up to review and discuss possible injection. Pt has completed 8 PT visits to date.  Today, pt reports that his R shoulder pain remains and goes up and down in terms of intensity depending on the day.   Dx imaging: R shoulder MRI-09/22/20; R shoulder XR- 07/26/20   Pertinent review of systems: No fevers or chills  Relevant historical information: Hypertension, hyperlipidemia, alcohol abuse history   Exam:  BP 126/72 (BP Location: Left Arm, Patient Position: Sitting, Cuff Size: Normal)   Pulse 82   Ht 5\' 11"  (1.803 m)   Wt 169 lb 9.6 oz (76.9 kg)   SpO2 95%   BMI 23.65 kg/m  General: Well Developed, well nourished, and in no acute distress.   MSK: Right shoulder normal-appearing nontender decreased motion abduction. Strength diminished abduction.    Lab and Radiology Results  EXAM: MRI OF THE RIGHT SHOULDER WITHOUT CONTRAST  TECHNIQUE: Multiplanar, multisequence MR imaging of the shoulder was performed. No intravenous contrast was administered.  COMPARISON:  Plain films right shoulder 07/26/2020.  FINDINGS: Rotator cuff: Rotator cuff tendinopathy is worst in the supraspinatus where there is a full-thickness tear measuring 1.1 cm from front to back at the leading edge of the tendon. Retraction is 2.5-3.5 cm. The rotator cuff is otherwise intact.  Muscles:  No atrophy or focal lesion.  Biceps long head: Intact with tendinopathy of the intra-articular segment seen.  Acromioclavicular Joint: Moderate osteoarthritis. Type 2 acromion. There is fluid and some debris in the subacromial/subdeltoid bursa.  Glenohumeral Joint: Mild osteoarthritis with cartilage thinning  and a small focus of subchondral edema in the inferior aspect of the glenoid.  Labrum:  Intact.  The inferior labrum is degenerated.  Bones:  No fracture, contusion or worrisome lesion.  Other: None.  IMPRESSION: Rotator cuff tendinopathy with a 1.1 cm from front to back tear of the anterior and far lateral supraspinatus. Retraction is 2.5-3.5 cm. No atrophy.  Tendinopathy of the intra-articular long head of biceps without tear.  Moderate acromioclavicular and mild glenohumeral osteoarthritis.  Subacromial/subdeltoid bursitis.   Electronically Signed   By: 09/25/2020 M.D.   On: 09/23/2020 09:39  I, 13/06/2020, personally (independently) visualized and performed the interpretation of the images attached in this note. Partial tear of the supraspinatus with retraction without atrophy.    Assessment and Plan: 74 y.o. male with right shoulder pain due to partial supraspinatus tear. The ends however are retracted but do not have atrophy. Patient has had good trial of conservative management including physical therapy. At this point he continues to have pain and dysfunction. I think at this point it is reasonable to have consultation with orthopedic surgery to discuss surgical options. He is 74 years old and does have some medical problems that make him a less than ideal surgical candidate but I do think it is worthwhile to at least consider surgery. Referral placed today. Recheck back with me as needed.    Orders Placed This Encounter  Procedures  . Ambulatory referral to Orthopedic Surgery    Referral Priority:   Routine    Referral Type:   Surgical    Referral Reason:  Specialty Services Required    Requested Specialty:   Orthopedic Surgery    Number of Visits Requested:   1   No orders of the defined types were placed in this encounter.    Discussed warning signs or symptoms. Please see discharge instructions. Patient expresses understanding.   The  above documentation has been reviewed and is accurate and complete Clementeen Graham, M.D.  Total encounter time 30 minutes including face-to-face time with the patient and, reviewing past medical record, and charting on the date of service.   Discussed in detail MRI findings, and discussed in detail treatment plan and options.

## 2020-09-26 NOTE — Therapy (Addendum)
[place], [place], [address] Phone: [phone]   Fax:  [phone]  Physical Therapy Treatment / Discharge  Patient Details  Name: Ryan Diaz MRN: [id number] Date of Birth: Feb 21, 1946 Referring Provider (PT): Gregor Hams, MD   Encounter Date: 09/26/2020   PT End of Session - 09/26/20 1003    Visit Number 8    Number of Visits 12    Date for PT Re-Evaluation 10/04/20    Authorization Type [employer]    PT Start Time 1000    PT Stop Time 1040    PT Time Calculation (min) 40 min    Activity Tolerance Patient tolerated treatment well    Behavior During Therapy [facility] for tasks assessed/performed           Past Medical History:  Diagnosis Date  . 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 ALLERGIC RHINITIS 08/08/2007  . ANXIETY 08/08/2007  . Carpal tunnel syndrome   . COPD 10/03/2009  . DEPRESSION 08/08/2007  . ERECTILE DYSFUNCTION 08/08/2007  . GENITAL HERPES, HX OF 08/08/2007  . GERD 08/08/2007  . GLUCOSE INTOLERANCE 08/08/2007  . HYPERLIPIDEMIA 08/08/2007  . HYPERTENSION 08/08/2007  . Impaired glucose tolerance 09/26/2011  . Insomnia 05/18/2012  . PAIN IN SOFT TISSUES OF LIMB 10/02/2009  . Sleep apnea    OSA-uses CPAP nightly    Past Surgical History:  Procedure Laterality Date  . ACHILLES TENDON REPAIR Bilateral   . CARPAL TUNNEL RELEASE Right 08/18/2018   Procedure: RIGHT CARPAL TUNNEL RELEASE;  Surgeon: Leanora Cover, MD;  Location: [place];  Service: Orthopedics;  Laterality: Right;  . CARPAL TUNNEL RELEASE Left 11/21/2018   Procedure: LEFT CARPAL TUNNEL RELEASE;  Surgeon: Leanora Cover, MD;  Location: [place];  Service: Orthopedics;  Laterality: Left;  Bier block  . COLONOSCOPY      There were no vitals filed for this visit.   Subjective Assessment - 09/26/20 1002    Subjective Patient reports right shoulder is feeling better today.    Patient  Stated Goals Get shoulder stronger    Currently in Pain? Yes    Pain Score 2     Pain Location Shoulder    Pain Orientation Right    Pain Descriptors / Indicators Aching    Pain Type Chronic pain    Pain Onset More than a month ago    Pain Frequency Intermittent    Aggravating Factors  Raising the shoulder up    Pain Relieving Factors Rest, pulling shoulder back    Effect of Pain on Daily Activities Patient limited using right shoulder overhead              [facility] PT Assessment - 09/26/20 0001      Assessment   Medical Diagnosis Chronic pain of both shoulders    Referring Provider (PT) Gregor Hams, MD      Precautions   Precautions None      Restrictions   Weight Bearing Restrictions No      Balance Screen   Has the patient fallen in the past 6 months No      Prior Function   Level of Independence Independent      AROM   Right Shoulder Flexion 120 Degrees   shrug noted     PROM   Overall PROM Comments PROM grossly equal bilaterally and non-painful      Strength   Right Shoulder Flexion 3-/5    Right  Shoulder Extension 4-/5    Right Shoulder ABduction 3-/5    Right Shoulder Internal Rotation 4+/5    Right Shoulder External Rotation 3-/5                         [facility] Adult PT Treatment/Exercise - 09/26/20 0001      Exercises   Exercises Shoulder      Shoulder Exercises: Supine   Protraction 10 reps   2 sets   Protraction Weight (lbs) 2    Protraction Limitations dowel press with plus    Flexion 10 reps   2 sets   Shoulder Flexion Weight (lbs) 2    Flexion Limitations dowel press with overhead stretch    Other Supine Exercises Rhythmic stabilization at 60, 90, 120 deg scapular plane 2 x 20 sec each   resistance applied proximal to elbow     Shoulder Exercises: Sidelying   External Rotation 10 reps    External Rotation Limitations able to go through almost full range    Flexion 10 reps    Flexion Limitations scapular plane       Shoulder Exercises: Standing   Flexion 15 reps   2 sets   Flexion Limitations towel wall slide    Extension 20 reps   2 sets   Theraband Level (Shoulder Extension) Level 4 (Blue);Level 3 (Green)    Extension Limitations 1st set blue, 2nd set green    Row 20 reps   2 sets   Theraband Level (Shoulder Row) Level 4 (Blue)      Shoulder Exercises: Isometric Strengthening   Flexion 5X5"   2 sets   External Rotation 5X5"   2 sets                 PT Education - 09/26/20 1003    Education Details HEP, schedule follow-up appointments if needed after doctor visit    Person(s) Educated Patient    Methods Explanation;Demonstration;Verbal cues    Comprehension Verbalized understanding;Returned demonstration;Verbal cues required;Need further instruction            PT Short Term Goals - 09/17/20 1023      PT SHORT TERM GOAL #1   Title Patient will be I with initial HEP to progress with PT    Time 3    Period Weeks    Status On-going    Target Date 09/13/20      PT SHORT TERM GOAL #2   Title Review FOTO with patient    Time 2    Period Weeks    Status Achieved    Target Date 09/06/20             PT Long Term Goals - 09/19/20 0957      PT LONG TERM GOAL #1   Title Patient will be I with final HEP to maintain progress from PT    Time 6    Period Weeks    Status New    Target Date 10/04/20      PT LONG TERM GOAL #2   Title Patient will demonstrate right shoulder flexion >/= 150 deg to improve reaching into high cabinet    Time 6    Period Weeks    Status New    Target Date 10/04/20      PT LONG TERM GOAL #3   Title Patient will exhibit gross right shoulder strength >/= 4/5 MMT to improve ability to lift objects into cabinet at shoulder  height    Time 6    Period Weeks    Status New    Target Date 10/04/20      PT LONG TERM GOAL #4   Title Patient will report improved functional level of >/= 65% functional status on FOTO    Baseline Initial: 52%, current  09/19/2020 53%    Time 6    Period Weeks    Status On-going    Target Date 10/04/20                 Plan - 09/26/20 1004    Clinical Impression Statement Patient tolerated therapy well with no adverse effects. He continues to demonstrate motion and strength deficit indicative of rotator cuff injury. Weakness most prevalent in ER and elevation. He continues to report feeling better following therapy. Patient is scheduled to see his doctor tomorrow to review his MRI so he was instructed to call and schedule further PT visits if indicated based on updated plan with his doctor. He would benefit from continued skilled PT to progress his motion and strength in order to reduce pain and improve overhead function with right arm.    PT Treatment/Interventions ADLs/Self Care Home Management;Cryotherapy;Electrical Stimulation;Iontophoresis 18m/ml Dexamethasone;Moist Heat;Neuromuscular re-education;Therapeutic exercise;Therapeutic activities;Patient/family education;Manual techniques;Dry needling;Passive range of motion;Taping;Spinal Manipulations;Joint Manipulations    PT Next Visit Plan Review HEP and progress PRN, manual and/or modalities for right shoulder pain PRN, progress right shoulder AAROM for elevation, rotator cuff (esp ER) and periscapular strengthening    PT Home Exercise Plan [id number]: supine AAROM dowel press, wall towel slide flexion, row with green, extension with yellow, sidelying flexion and ER; S/L open books, seated T/S ext over chair, standing scap retract + ER at corner    Consulted and Agree with Plan of Care Patient           Patient will benefit from skilled therapeutic intervention in order to improve the following deficits and impairments:  Decreased range of motion, Pain, Decreased activity tolerance, Postural dysfunction, Decreased strength  Visit Diagnosis: Chronic right shoulder pain  Chronic left shoulder pain  Muscle weakness (generalized)     Problem  List Patient Active Problem List   Diagnosis Date Noted  . Acute pain of right shoulder 07/27/2020  . Vitamin D deficiency 06/01/2020  . Weight loss 09/19/2019  . Rash 03/27/2019  . Right foot pain 12/08/2018  . Blurred vision, bilateral 10/19/2018  . Bilateral carpal tunnel syndrome 07/11/2018  . Tenosynovitis of hand 07/11/2018  . Right ankle pain 03/09/2018  . Facial rash 10/16/2017  . Bruising 02/11/2017  . Cough 12/16/2016  . Wheezing 12/16/2016  . Memory dysfunction 07/15/2016  . Dizziness 07/10/2015  . Mastoiditis 07/10/2015  . Fatigue 07/10/2015  . Abnormal TSH 07/10/2015  . Pulsatile tinnitus of right ear 01/23/2015  . Renal insufficiency 01/09/2015  . Bilateral ankle pain 04/10/2014  . OSA (obstructive sleep apnea) 09/27/2013  . Right carpal tunnel syndrome 07/06/2013  . Insomnia 05/18/2012  . Nocturia 11/18/2011  . Impaired glucose tolerance 09/26/2011  . Preventative health care 09/26/2011  . COPD mixed type ([place] 10/03/2009  . Hyperlipidemia 08/08/2007  . Anxiety state 08/08/2007  . ERECTILE DYSFUNCTION 08/08/2007  . 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 Depression 08/08/2007  . Essential hypertension 08/08/2007  . Allergic rhinitis 08/08/2007  . GERD 08/08/2007  . GENITAL HERPES, HX OF 08/08/2007    CHilda Blades PT, DPT, LAT, ATC 09/26/20  10:43 AM Phone: [phone]: [facility]  [place], [place], [address] Phone: [phone]   Fax:  [phone]  Name: Ryan Diaz MRN: [id number] Date of Birth: [birth year]   PHYSICAL THERAPY DISCHARGE SUMMARY  Visits from Start of Care: 8  Current functional level related to goals / functional outcomes: See above   Remaining deficits: See above   Education / Equipment: HEP Plan: Patient agrees to discharge.  Patient goals were partially met. Patient is being discharged due to not returning since the last visit.   ?????    Hilda Blades, PT, DPT, LAT, ATC 11/18/20  12:27 PM Phone: [phone] Fax: [phone]

## 2020-09-27 ENCOUNTER — Ambulatory Visit: Payer: Self-pay

## 2020-09-27 ENCOUNTER — Encounter: Payer: Self-pay | Admitting: Family Medicine

## 2020-09-27 ENCOUNTER — Ambulatory Visit: Payer: PPO | Admitting: Internal Medicine

## 2020-09-27 ENCOUNTER — Ambulatory Visit (INDEPENDENT_AMBULATORY_CARE_PROVIDER_SITE_OTHER): Payer: PPO | Admitting: Family Medicine

## 2020-09-27 VITALS — BP 126/72 | HR 82 | Ht 71.0 in | Wt 169.6 lb

## 2020-09-27 DIAGNOSIS — G8929 Other chronic pain: Secondary | ICD-10-CM

## 2020-09-27 DIAGNOSIS — M25512 Pain in left shoulder: Secondary | ICD-10-CM | POA: Diagnosis not present

## 2020-09-27 DIAGNOSIS — M75111 Incomplete rotator cuff tear or rupture of right shoulder, not specified as traumatic: Secondary | ICD-10-CM | POA: Diagnosis not present

## 2020-09-27 DIAGNOSIS — M25511 Pain in right shoulder: Secondary | ICD-10-CM

## 2020-09-27 NOTE — Patient Instructions (Addendum)
Thank you for coming in today.  Plan for surgery referral.  Call their office to make an appointment or they will call you  Let me know if you have problems.

## 2020-10-03 ENCOUNTER — Other Ambulatory Visit: Payer: Self-pay

## 2020-10-03 ENCOUNTER — Encounter: Payer: Self-pay | Admitting: Internal Medicine

## 2020-10-03 ENCOUNTER — Ambulatory Visit (INDEPENDENT_AMBULATORY_CARE_PROVIDER_SITE_OTHER): Payer: PPO | Admitting: Internal Medicine

## 2020-10-03 VITALS — BP 120/80 | HR 92 | Temp 98.0°F | Ht 71.0 in | Wt 169.0 lb

## 2020-10-03 DIAGNOSIS — I10 Essential (primary) hypertension: Secondary | ICD-10-CM | POA: Diagnosis not present

## 2020-10-03 DIAGNOSIS — R7302 Impaired glucose tolerance (oral): Secondary | ICD-10-CM | POA: Diagnosis not present

## 2020-10-03 DIAGNOSIS — E559 Vitamin D deficiency, unspecified: Secondary | ICD-10-CM

## 2020-10-03 DIAGNOSIS — E538 Deficiency of other specified B group vitamins: Secondary | ICD-10-CM | POA: Diagnosis not present

## 2020-10-03 DIAGNOSIS — J449 Chronic obstructive pulmonary disease, unspecified: Secondary | ICD-10-CM | POA: Diagnosis not present

## 2020-10-03 DIAGNOSIS — Z0000 Encounter for general adult medical examination without abnormal findings: Secondary | ICD-10-CM

## 2020-10-03 NOTE — Patient Instructions (Signed)
Everything looks good today  Please continue your follow up for the right shoulder as you have planned  Please continue all other medications as before, and refills have been done if requested.  Please have the pharmacy call with any other refills you may need.  Please continue your efforts at being more active, low cholesterol diet, and weight control.  Please keep your appointments with your specialists as you may have planned  Please make an Appointment to return in 6 months, or sooner if needed, also with Lab Appointment for testing done 3-5 days before at the FIRST FLOOR Lab (so this is for TWO appointments - please see the scheduling desk as you leave)

## 2020-10-03 NOTE — Progress Notes (Signed)
Subjective:    Patient ID: Ryan Diaz, male    DOB: 02/05/1946, 74 y.o.   MRN: [id number]  HPI  Here to f/u; overall doing ok,  Pt denies chest pain, increasing sob or doe, wheezing, orthopnea, PND, increased LE swelling, palpitations, dizziness or syncope.  Pt denies new neurological symptoms such as new headache, or facial or extremity weakness or numbness.  Pt denies polydipsia, polyuria, or low sugar episode.  Pt states overall good compliance with meds, mostly trying to follow appropriate diet, with wt overall stable,  but little exercise however. No new complaints Past Medical History:  Diagnosis Date  . 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 ALLERGIC RHINITIS 08/08/2007  . ANXIETY 08/08/2007  . Carpal tunnel syndrome   . COPD 10/03/2009  . DEPRESSION 08/08/2007  . ERECTILE DYSFUNCTION 08/08/2007  . GENITAL HERPES, HX OF 08/08/2007  . GERD 08/08/2007  . GLUCOSE INTOLERANCE 08/08/2007  . HYPERLIPIDEMIA 08/08/2007  . HYPERTENSION 08/08/2007  . Impaired glucose tolerance 09/26/2011  . Insomnia 05/18/2012  . PAIN IN SOFT TISSUES OF LIMB 10/02/2009  . Sleep apnea    OSA-uses CPAP nightly   Past Surgical History:  Procedure Laterality Date  . ACHILLES TENDON REPAIR Bilateral   . CARPAL TUNNEL RELEASE Right 08/18/2018   Procedure: RIGHT CARPAL TUNNEL RELEASE;  Surgeon: Betha Loa, MD;  Location: [facility];  Service: Orthopedics;  Laterality: Right;  . CARPAL TUNNEL RELEASE Left 11/21/2018   Procedure: LEFT CARPAL TUNNEL RELEASE;  Surgeon: Betha Loa, MD;  Location: [facility];  Service: Orthopedics;  Laterality: Left;  Bier block  . COLONOSCOPY      reports that he quit smoking about 11 years ago. His smoking use included cigarettes. He has a 52.50 pack-year smoking history. He has never used smokeless tobacco. He reports current alcohol use of about 46.0 - 53.0 standard drinks of alcohol per week. He reports current drug use. Drug: Marijuana. family  history includes Colon polyps in his brother. No Known Allergies Current Outpatient Medications on File Prior to Visit  Medication Sig Dispense Refill  . albuterol (PROVENTIL HFA;VENTOLIN HFA) 108 (90 BASE) MCG/ACT inhaler Inhale 2 puffs into the lungs every 6 (six) hours as needed for wheezing or shortness of breath.    Marland Kitchen aspirin EC 81 MG tablet Take 81 mg by mouth daily.    Marland Kitchen atorvastatin (LIPITOR) 80 MG tablet Take 1 tablet (80 mg total) by mouth daily. 90 tablet 3  . diclofenac sodium (VOLTAREN) 1 % GEL Apply 2 g topically 4 (four) times daily as needed. 100 g 5  . donepezil (ARICEPT) 5 MG tablet Take 1 tablet (5 mg total) by mouth at bedtime. 90 tablet 5  . fluocinonide ointment (LIDEX) 0.05 % Apply 1 application topically 2 (two) times daily. 30 g 2  . fluticasone (CUTIVATE) 0.05 % cream SMARTSIG:Topical 1 to 2 Times Daily PRN    . Fluticasone-Umeclidin-Vilant (TRELEGY ELLIPTA) 100-62.5-25 MCG/INH AEPB Use as directed 1 puff in the mouth or throat daily. 60 each 12  . gabapentin (NEURONTIN) 100 MG capsule Take 1 capsule (100 mg total) by mouth 3 (three) times daily. 90 capsule 5  . ipratropium (ATROVENT) 0.06 % nasal spray Place 2 sprays into both nostrils 3 (three) times daily. 15 mL 5  . meloxicam (MOBIC) 15 MG tablet 1 tab by mouth once daily as needed 90 tablet 1  . Omega-3 Fatty Acids (FISH OIL) 1000 MG CAPS Take 1,000 mg by mouth 2 (two) times  daily.    . omeprazole (PRILOSEC) 20 MG capsule Take 1 capsule (20 mg total) by mouth 2 (two) times daily. 90 capsule 3  . terazosin (HYTRIN) 2 MG capsule Take 1 capsule (2 mg total) by mouth at bedtime. 90 capsule 3  . triamcinolone cream (KENALOG) 0.1 % SMARTSIG:Topical 1 to 2 Times Daily PRN    . cetirizine (ZYRTEC) 10 MG tablet Take 1 tablet (10 mg total) by mouth daily. 30 tablet 11   No current facility-administered medications on file prior to visit.   Review of Systems All otherwise neg per pt    Objective:   Physical Exam BP  120/80 (BP Location: Left Arm, Patient Position: Sitting, Cuff Size: Large)   Pulse 92   Temp 98 F (36.7 C) (Oral)   Ht 5\' 11"  (1.803 m)   Wt 169 lb (76.7 kg)   SpO2 95%   BMI 23.57 kg/m  VS noted,  Constitutional: Pt appears in NAD HENT: Head: NCAT.  Right Ear: External ear normal.  Left Ear: External ear normal.  Eyes: . Pupils are equal, round, and reactive to light. Conjunctivae and EOM are normal Nose: without d/c or deformity Neck: Neck supple. Gross normal ROM Cardiovascular: Normal rate and regular rhythm.   Pulmonary/Chest: Effort normal and breath sounds without rales or wheezing.  Abd:  Soft, NT, ND, + BS, no organomegaly Neurological: Pt is alert. At baseline orientation, motor grossly intact Skin: Skin is warm. No rashes, other new lesions, no LE edema Psychiatric: Pt behavior is normal without agitation  All otherwise neg per pt Lab Results  Component Value Date   WBC 14.0 (H) 03/27/2020   HGB 15.1 03/27/2020   HCT 44.8 03/27/2020   PLT 274.0 03/27/2020   GLUCOSE 91 03/27/2020   CHOL 148 03/27/2020   TRIG 155.0 (H) 03/27/2020   HDL 37.40 (L) 03/27/2020   LDLDIRECT 83.0 10/19/2018   LDLCALC 79 03/27/2020   ALT 28 03/27/2020   AST 20 03/27/2020   NA 138 03/27/2020   K 4.7 03/27/2020   CL 104 03/27/2020   CREATININE 1.10 03/27/2020   BUN 19 03/27/2020   CO2 28 03/27/2020   TSH 0.84 03/27/2020   PSA 1.00 03/27/2020   HGBA1C 5.9 03/27/2020      Assessment & Plan:

## 2020-10-04 ENCOUNTER — Ambulatory Visit (INDEPENDENT_AMBULATORY_CARE_PROVIDER_SITE_OTHER): Payer: PPO | Admitting: Orthopaedic Surgery

## 2020-10-04 ENCOUNTER — Encounter: Payer: Self-pay | Admitting: Orthopaedic Surgery

## 2020-10-04 VITALS — Ht 71.0 in | Wt 169.0 lb

## 2020-10-04 DIAGNOSIS — M75101 Unspecified rotator cuff tear or rupture of right shoulder, not specified as traumatic: Secondary | ICD-10-CM | POA: Diagnosis not present

## 2020-10-04 DIAGNOSIS — IMO0002 Reserved for concepts with insufficient information to code with codable children: Secondary | ICD-10-CM

## 2020-10-04 MED ORDER — LIDOCAINE HCL 1 % IJ SOLN
3.0000 mL | INTRAMUSCULAR | Status: AC | PRN
  Administered 2020-10-04: 3 mL

## 2020-10-04 MED ORDER — BUPIVACAINE HCL 0.5 % IJ SOLN
3.0000 mL | INTRAMUSCULAR | Status: AC | PRN
  Administered 2020-10-04: 3 mL via INTRA_ARTICULAR

## 2020-10-04 MED ORDER — METHYLPREDNISOLONE ACETATE 40 MG/ML IJ SUSP
40.0000 mg | INTRAMUSCULAR | Status: AC | PRN
  Administered 2020-10-04: 40 mg via INTRA_ARTICULAR

## 2020-10-04 NOTE — Progress Notes (Signed)
Office Visit Note   Patient: Ryan Diaz           Date of Birth: 1946/05/24           MRN: [id number] Visit Date: 10/04/2020              Requested by: Rodolph Bong, MD [address] [place],  [place] [address] PCP: Corwin Levins, MD   Assessment & Plan: Visit Diagnoses:  1. Disorder of rotator cuff syndrome of right shoulder and allied disorder     Plan: MRI shows supraspinatus tendinopathy with a 1cm anterior and far lateral supraspinatus tear.  There is some retraction.  I do see some superior muscle belly atrophy and fatty replacement.  Overall my impression is that this is probably acute on chronic tear that has acutely gotten worse from a symptom standpoint.  Given his age we had a detailed discussion on treatment options that include nonsurgical and surgical and I have recommended continue with conservative treatment.  He elected to have a subacromial injection today.  I would like to recheck his response to injection about a month.  We may consider getting him back into physical therapy if he is feeling up to it.  Follow-Up Instructions: Return in about 4 weeks (around 11/01/2020).   Orders:  Orders Placed This Encounter  Procedures  . Large Joint Inj   No orders of the defined types were placed in this encounter.     Procedures: Large Joint Inj: R subacromial bursa on 10/04/2020 11:01 AM Indications: pain Details: 22 G needle  Arthrogram: No  Medications: 3 mL lidocaine 1 %; 3 mL bupivacaine 0.5 %; 40 mg methylPREDNISolone acetate 40 MG/ML Outcome: tolerated well, no immediate complications Consent was given by the patient. Patient was prepped and draped in the usual sterile fashion.       Clinical Data: No additional findings.   Subjective: Chief Complaint  Patient presents with  . Right Shoulder - Pain    Mr. [name] is a 74 year old gentleman comes in for referral from Dr. Denyse Amass for chronic right shoulder pain for about 3 months without any  injuries.  He did try physical therapy but this made the pain worse.  He has had some limitation range of motion due to the pain.  Not taking any pain medications and he is left-hand dominant.  He has not had any injections.   Review of Systems  Constitutional: Negative.   All other systems reviewed and are negative.    Objective: Vital Signs: Ht 5\' 11"  (1.803 m)   Wt 169 lb (76.7 kg)   BMI 23.57 kg/m   Physical Exam Vitals and nursing note reviewed.  Constitutional:      Appearance: He is well-developed.  HENT:     Head: Normocephalic and atraumatic.  Eyes:     Pupils: Pupils are equal, round, and reactive to light.  Pulmonary:     Effort: Pulmonary effort is normal.  Abdominal:     Palpations: Abdomen is soft.  Musculoskeletal:        General: Normal range of motion.     Cervical back: Neck supple.  Skin:    General: Skin is warm.  Neurological:     Mental Status: He is alert and oriented to person, place, and time.  Psychiatric:        Behavior: Behavior normal.        Thought Content: Thought content normal.        Judgment: Judgment  normal.     Ortho Exam Right shoulder shows full range of motion with mild to moderate pain.  Positive empty can.  Negative drop arm.  Negative Hawkins sign.  Bicipital groove is nontender. Specialty Comments:  No specialty comments available.  Imaging: No results found.   PMFS History: Patient Active Problem List   Diagnosis Date Noted  . Disorder of rotator cuff syndrome of right shoulder and allied disorder 10/04/2020  . Acute pain of right shoulder 07/27/2020  . Vitamin D deficiency 06/01/2020  . Weight loss 09/19/2019  . Rash 03/27/2019  . Right foot pain 12/08/2018  . Blurred vision, bilateral 10/19/2018  . Bilateral carpal tunnel syndrome 07/11/2018  . Tenosynovitis of hand 07/11/2018  . Right ankle pain 03/09/2018  . Facial rash 10/16/2017  . Bruising 02/11/2017  . Cough 12/16/2016  . Wheezing 12/16/2016  .  Memory dysfunction 07/15/2016  . Dizziness 07/10/2015  . Mastoiditis 07/10/2015  . Fatigue 07/10/2015  . Abnormal TSH 07/10/2015  . Pulsatile tinnitus of right ear 01/23/2015  . Renal insufficiency 01/09/2015  . Bilateral ankle pain 04/10/2014  . OSA (obstructive sleep apnea) 09/27/2013  . Right carpal tunnel syndrome 07/06/2013  . Insomnia 05/18/2012  . Nocturia 11/18/2011  . Impaired glucose tolerance 09/26/2011  . Preventative health care 09/26/2011  . COPD mixed type (HCC) 10/03/2009  . Hyperlipidemia 08/08/2007  . Anxiety state 08/08/2007  . ERECTILE DYSFUNCTION 08/08/2007  . 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 Depression 08/08/2007  . Essential hypertension 08/08/2007  . Allergic rhinitis 08/08/2007  . GERD 08/08/2007  . GENITAL HERPES, HX OF 08/08/2007   Past Medical History:  Diagnosis Date  . 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 ALLERGIC RHINITIS 08/08/2007  . ANXIETY 08/08/2007  . Carpal tunnel syndrome   . COPD 10/03/2009  . DEPRESSION 08/08/2007  . ERECTILE DYSFUNCTION 08/08/2007  . GENITAL HERPES, HX OF 08/08/2007  . GERD 08/08/2007  . GLUCOSE INTOLERANCE 08/08/2007  . HYPERLIPIDEMIA 08/08/2007  . HYPERTENSION 08/08/2007  . Impaired glucose tolerance 09/26/2011  . Insomnia 05/18/2012  . PAIN IN SOFT TISSUES OF LIMB 10/02/2009  . Sleep apnea    OSA-uses CPAP nightly    Family History  Problem Relation Age of Onset  . Colon polyps Brother     Past Surgical History:  Procedure Laterality Date  . ACHILLES TENDON REPAIR Bilateral   . CARPAL TUNNEL RELEASE Right 08/18/2018   Procedure: RIGHT CARPAL TUNNEL RELEASE;  Surgeon: Betha Loa, MD;  Location: [facility];  Service: Orthopedics;  Laterality: Right;  . CARPAL TUNNEL RELEASE Left 11/21/2018   Procedure: LEFT CARPAL TUNNEL RELEASE;  Surgeon: Betha Loa, MD;  Location: [facility];  Service: Orthopedics;  Laterality: Left;  Bier block  . COLONOSCOPY     Social History    Occupational History  . 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 Smoking status: Former Smoker    Packs/day: 1.50    Years: 35.00    Pack years: 52.50    Types: Cigarettes    Quit date: 09/04/2009    Years since quitting: 11.0  . Smokeless tobacco: Never Used  Vaping Use  . Vaping Use: Never used  Substance and Sexual Activity  . Alcohol use: Yes    Alcohol/week: 46.0 - 53.0 standard drinks    Types: 28 - 35 Cans of beer, 18 Standard drinks or equivalent per week    Comment: everyday drinker amount varies   . Drug use: Yes  Types: Marijuana    Comment: marijuana occassionally  . Sexual activity: Yes

## 2020-10-05 ENCOUNTER — Encounter: Payer: Self-pay | Admitting: Internal Medicine

## 2020-10-05 NOTE — Assessment & Plan Note (Signed)
For vit d3 2000 u qd

## 2020-10-05 NOTE — Assessment & Plan Note (Signed)
stable overall by history and exam, recent data reviewed with pt, and pt to continue medical treatment as before,  to f/u any worsening symptoms or concerns  

## 2020-10-05 NOTE — Assessment & Plan Note (Addendum)
stable overall by history and exam, recent data reviewed with pt, and pt to continue medical treatment as before,  to f/u any worsening symptoms or concerns  I spent 31 minutes in preparing to see the patient by review of recent labs, imaging and procedures, obtaining and reviewing separately obtained history, communicating with the patient and family or caregiver, ordering medications, tests or procedures, and documenting clinical information in the EHR including the differential Dx, treatment, and any further evaluation and other management of hyperglycemia, copd, vit d def, htn

## 2020-10-09 ENCOUNTER — Other Ambulatory Visit: Payer: Self-pay

## 2020-10-09 ENCOUNTER — Ambulatory Visit (INDEPENDENT_AMBULATORY_CARE_PROVIDER_SITE_OTHER): Payer: PPO | Admitting: Cardiology

## 2020-10-09 VITALS — BP 136/70 | HR 90 | Ht 71.0 in | Wt 168.0 lb

## 2020-10-09 DIAGNOSIS — Z01812 Encounter for preprocedural laboratory examination: Secondary | ICD-10-CM

## 2020-10-09 DIAGNOSIS — I200 Unstable angina: Secondary | ICD-10-CM | POA: Diagnosis not present

## 2020-10-09 DIAGNOSIS — I10 Essential (primary) hypertension: Secondary | ICD-10-CM

## 2020-10-09 DIAGNOSIS — E7800 Pure hypercholesterolemia, unspecified: Secondary | ICD-10-CM

## 2020-10-09 DIAGNOSIS — I6521 Occlusion and stenosis of right carotid artery: Secondary | ICD-10-CM | POA: Diagnosis not present

## 2020-10-09 LAB — BASIC METABOLIC PANEL
BUN/Creatinine Ratio: 23 (ref 10–24)
BUN: 24 mg/dL (ref 8–27)
CO2: 26 mmol/L (ref 20–29)
Calcium: 10.1 mg/dL (ref 8.6–10.2)
Chloride: 99 mmol/L (ref 96–106)
Creatinine, Ser: 1.04 mg/dL (ref 0.76–1.27)
GFR calc Af Amer: 81 mL/min/{1.73_m2} (ref >59)
GFR calc non Af Amer: 70 mL/min/{1.73_m2} (ref >59)
Glucose: 84 mg/dL (ref 65–99)
Potassium: 4.8 mmol/L (ref 3.5–5.2)
Sodium: 138 mmol/L (ref 134–144)

## 2020-10-09 LAB — CBC
Hematocrit: 46.1 % (ref 37.5–51.0)
Hemoglobin: 15.7 g/dL (ref 13.0–17.7)
MCH: 32.6 pg (ref 26.6–33.0)
MCHC: 34.1 g/dL (ref 31.5–35.7)
MCV: 96 fL (ref 79–97)
Platelets: 327 10*3/uL (ref 150–450)
RBC: 4.81 x10E6/uL (ref 4.14–5.80)
RDW: 12.4 % (ref 11.6–15.4)
WBC: 11.1 10*3/uL — ABNORMAL HIGH (ref 3.4–10.8)

## 2020-10-09 MED ORDER — NITROGLYCERIN 0.4 MG SL SUBL: 0.4000 mg | SUBLINGUAL_TABLET | SUBLINGUAL | 4 refills | Status: AC | PRN

## 2020-10-09 NOTE — Patient Instructions (Signed)
Medication Instructions:  The current medical regimen is effective;  continue present plan and medications. You may take SL Nitroglycerin as needed for chest pain.  *If you need a refill on your cardiac medications before your next appointment, please call your pharmacy*  Lab Work: Please have blood work today (CMP,BMP) If you have labs (blood work) drawn today and your tests are completely normal, you will receive your results only by:  MyChart Message (if you have MyChart) OR  A paper copy in the mail If you have any lab test that is abnormal or we need to change your treatment, we will call you to review the results.   Testing/Procedures: Your physician has requested that you have an echocardiogram. Echocardiography is a painless test that uses sound waves to create images of your heart. It provides your doctor with information about the size and shape of your heart and how well your hearts chambers and valves are working. This procedure takes approximately one hour. There are no restrictions for this procedure.    [facility] CARDIOVASCULAR DIVISION [facility] OFFICE [address] [place], [address] [place] [address] Dept: [phone] Loc: [phone]  Ryan Diaz  10/09/2020  You are scheduled for a heart cath on Thursday October 17, 2020 with Dr. Okey Dupre.  1. Please arrive at the [facility] (Main Entrance A) at [facility]: [address] [place], [place] [address] at 5:30 am (two hours before your procedure to ensure your preparation). Free valet parking service is available.   Special note: Every effort is made to have your procedure done on time. Please understand that emergencies sometimes delay scheduled procedures.  2. Diet: nothing after midnight  3. Labs: Have blood work today here.     Have Covid screen as scheduled.  4. Medication instructions in preparation for your procedure:  On the morning of your  procedure, take your Aspirin and any morning medicines NOT listed above.  You may use sips of water.  5. Plan for one night stay--bring personal belongings. 6. Bring a current list of your medications and current insurance cards. 7. You MUST have a responsible person to drive you home. 8. Someone MUST be with you the first 24 hours after you arrive home or your discharge will be delayed. 9. Please wear clothes that are easy to get on and off and wear slip-on shoes.  Thank you for allowing [place] to care for you!   -- [facility]   Follow-Up: At [facility], you and your health needs are our priority.  As part of our continuing mission to provide you with exceptional heart care, we have created designated Provider Care Teams.  These Care Teams include your primary Cardiologist (physician) and Advanced Practice Providers (APPs -  Physician Assistants and Nurse Practitioners) who all work together to provide you with the care you need, when you need it.  We recommend signing up for the patient portal called "MyChart".  Sign up information is provided on this After Visit Summary.  MyChart is used to connect with patients for Virtual Visits (Telemedicine).  Patients are able to view lab/test results, encounter notes, upcoming appointments, etc.  Non-urgent messages can be sent to your provider as well.   To learn more about what you can do with MyChart, go to [email or web address].    Your next appointment:   2 month(s)  The format for your next appointment:   In Person  Provider:  Donato Schultz, MD   Thank you for choosing [facility]!!

## 2020-10-09 NOTE — H&P (View-Only) (Signed)
Cardiology Office Note:    Date:  10/10/2020   ID:  Ryan Diaz, DOB [birth year], MRN [id number]  PCP:  Corwin Levins, MD  [facility] Cardiologist:  Donato Schultz, MD  [facility] Electrophysiologist:  None   Referring MD: Waymon Budge, MD     History of Present Illness:    Ryan Diaz is a 74 y.o. male duration of chest pain at the request of Dr. John/Dr. Fannie Knee of pulmonary.  In review of his prior note from 07/26/2020, was doing quite well but did have some shoulder discomfort.  Dr. Maple Hudson did receive a phone call stating that he was feeling some shortness of breath with exertion as well as chest tightness when he was having it. Originally it got better with COPD medication Trelegy.  Dr. Roxy Cedar office note reviewed from 09/23/2020 where he was concerned about a burning sensation in his chest with associated dyspnea on exertion. Certainly could be concomitant COPD as well as coronary disease.  Today he tells me when he is pulling out the garbage can or doing some yard work he will feel centralized chest tightness no radiation burning in his chest that when he sits down goes away.  Exertional anginal type symptoms.  This has been progressively getting worse over the past year.  Had [facility] Doppler - repeating next year.  He is compliant with his medications including atorvastatin, aspirin.  Past Medical History:  Diagnosis Date  . 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 ALLERGIC RHINITIS 08/08/2007  . ANXIETY 08/08/2007  . Carpal tunnel syndrome   . COPD 10/03/2009  . DEPRESSION 08/08/2007  . ERECTILE DYSFUNCTION 08/08/2007  . GENITAL HERPES, HX OF 08/08/2007  . GERD 08/08/2007  . GLUCOSE INTOLERANCE 08/08/2007  . HYPERLIPIDEMIA 08/08/2007  . HYPERTENSION 08/08/2007  . Impaired glucose tolerance 09/26/2011  . Insomnia 05/18/2012  . PAIN IN SOFT TISSUES OF LIMB 10/02/2009  . Sleep apnea    OSA-uses CPAP nightly    Past Surgical History:  Procedure Laterality  Date  . ACHILLES TENDON REPAIR Bilateral   . CARPAL TUNNEL RELEASE Right 08/18/2018   Procedure: RIGHT CARPAL TUNNEL RELEASE;  Surgeon: Betha Loa, MD;  Location: [facility];  Service: Orthopedics;  Laterality: Right;  . CARPAL TUNNEL RELEASE Left 11/21/2018   Procedure: LEFT CARPAL TUNNEL RELEASE;  Surgeon: Betha Loa, MD;  Location: [facility];  Service: Orthopedics;  Laterality: Left;  Bier block  . COLONOSCOPY      Current Medications: Current Meds  Medication Sig  . albuterol (PROVENTIL HFA;VENTOLIN HFA) 108 (90 BASE) MCG/ACT inhaler Inhale 2 puffs into the lungs every 6 (six) hours as needed for wheezing or shortness of breath.  Marland Kitchen aspirin EC 81 MG tablet Take 81 mg by mouth daily.  Marland Kitchen atorvastatin (LIPITOR) 80 MG tablet Take 1 tablet (80 mg total) by mouth daily.  . cetirizine (ZYRTEC) 10 MG tablet Take 1 tablet (10 mg total) by mouth daily.  . diclofenac sodium (VOLTAREN) 1 % GEL Apply 2 g topically 4 (four) times daily as needed.  . donepezil (ARICEPT) 5 MG tablet Take 1 tablet (5 mg total) by mouth at bedtime.  . fluocinonide ointment (LIDEX) 0.05 % Apply 1 application topically 2 (two) times daily.  . fluticasone (CUTIVATE) 0.05 % cream SMARTSIG:Topical 1 to 2 Times Daily PRN  . Fluticasone-Umeclidin-Vilant (TRELEGY ELLIPTA) 100-62.5-25 MCG/INH AEPB Use as directed 1 puff in the mouth or throat daily.  Marland Kitchen gabapentin (NEURONTIN) 100 MG capsule  Take 1 capsule (100 mg total) by mouth 3 (three) times daily.  Marland Kitchen ipratropium (ATROVENT) 0.06 % nasal spray Place 2 sprays into both nostrils 3 (three) times daily.  . meloxicam (MOBIC) 15 MG tablet 1 tab by mouth once daily as needed  . Omega-3 Fatty Acids (FISH OIL) 1000 MG CAPS Take 1,000 mg by mouth 2 (two) times daily.  Marland Kitchen omeprazole (PRILOSEC) 20 MG capsule Take 1 capsule (20 mg total) by mouth 2 (two) times daily.  Marland Kitchen terazosin (HYTRIN) 2 MG capsule Take 1 capsule (2 mg total) by mouth at bedtime.  .  triamcinolone cream (KENALOG) 0.1 % SMARTSIG:Topical 1 to 2 Times Daily PRN     Allergies:   Patient has no known allergies.   Social History   Socioeconomic History  . Marital status: Single    Spouse name: Not on file  . Number of children: Not on file  . Years of education: Not on file  . Highest education level: Not on file  Occupational History  . 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 Smoking status: Former Smoker    Packs/day: 1.50    Years: 35.00    Pack years: 52.50    Types: Cigarettes    Quit date: 09/04/2009    Years since quitting: 11.1  . Smokeless tobacco: Never Used  Vaping Use  . Vaping Use: Never used  Substance and Sexual Activity  . Alcohol use: Yes    Alcohol/week: 46.0 - 53.0 standard drinks    Types: 28 - 35 Cans of beer, 18 Standard drinks or equivalent per week    Comment: everyday drinker amount varies   . Drug use: Yes    Types: Marijuana    Comment: marijuana occassionally  . Sexual activity: Yes  Other Topics Concern  . Not on file  Social History Narrative  . Not on file   Social Determinants of Health   Financial Resource Strain: Low Risk   . Difficulty of Paying Living Expenses: Not hard at all  Food Insecurity: No Food Insecurity  . Worried About Programme researcher, broadcasting/film/video in the Last Year: Never true  . Ran Out of Food in the Last Year: Never true  Transportation Needs: No Transportation Needs  . Lack of Transportation (Medical): No  . Lack of Transportation (Non-Medical): No  Physical Activity: Inactive  . Days of Exercise per Week: 0 days  . Minutes of Exercise per Session: 0 min  Stress: No Stress Concern Present  . Feeling of Stress : Not at all  Social Connections: Unknown  . Frequency of Communication with Friends and Family: Patient refused  . Frequency of Social Gatherings with Friends and Family: Patient refused  . Attends Religious Services: Patient refused  . Active Member of Clubs or Organizations:  Patient refused  . Attends Banker Meetings: Patient refused  . Marital Status: Patient refused     Family History: The patient's family history includes Colon polyps in his brother.  ROS:   Please see the history of present illness.    No bleeding no fevers no chills no nausea no vomiting.  All other systems reviewed and are negative.  EKGs/Labs/Other Studies Reviewed:    The following studies were reviewed today:  Echo 07/17/2015: - Left ventricle: The cavity size was normal. Systolic function was  normal. The estimated ejection fraction was in the range of 55%  to 60%. Wall motion was normal; there were no regional wall  motion  abnormalities.   EKG:  EKG is  ordered today.  The ekg ordered today demonstrates sinus rhythm 90 with nonspecific ST-T wave changes, poor R wave progression  Recent Labs: 03/27/2020: ALT 28; TSH 0.84 10/09/2020: BUN 24; Creatinine, Ser 1.04; Hemoglobin 15.7; Platelets 327; Potassium 4.8; Sodium 138  Recent Lipid Panel    Component Value Date/Time   CHOL 148 03/27/2020 1118   TRIG 155.0 (H) 03/27/2020 1118   HDL 37.40 (L) 03/27/2020 1118   CHOLHDL 4 03/27/2020 1118   VLDL 31.0 03/27/2020 1118   LDLCALC 79 03/27/2020 1118   LDLDIRECT 83.0 10/19/2018 1048     Risk Assessment/Calculations:       Physical Exam:    VS:  BP 136/70   Pulse 90   Ht 5\' 11"  (1.803 m)   Wt 168 lb (76.2 kg)   SpO2 94%   BMI 23.43 kg/m     Wt Readings from Last 3 Encounters:  10/09/20 168 lb (76.2 kg)  10/04/20 169 lb (76.7 kg)  10/03/20 169 lb (76.7 kg)    GEN:  Well nourished, well developed in no acute distress HEENT: Right neck carotid bruit NECK: No JVD; No carotid bruits LYMPHATICS: No lymphadenopathy CARDIAC: RRR, no murmurs, rubs, gallops RESPIRATORY:  Clear to auscultation without rales, wheezing or rhonchi  ABDOMEN: Soft, non-tender, non-distended MUSCULOSKELETAL:  No edema; No deformity  SKIN: Warm and dry NEUROLOGIC:  Alert  and oriented x 3 PSYCHIATRIC:  Normal affect   ASSESSMENT:    1. Accelerating angina (HCC)   2. Pure hypercholesterolemia   3. Essential hypertension   4. Stenosis of right carotid artery   5. Pre-procedure lab exam    PLAN:    In order of problems listed above:  Accelerating angina -Symptoms are classic for exertional anginal symptoms.  Giving his longstanding smoking history of approximately 40 years with concomitant COPD, hyperlipidemia we will go ahead and proceed with cardiac catheterization given high pretest probability.  Risks benefits have been explained including stroke heart attack death renal impairment bleeding.  He is willing to proceed. -Last creatinine was 1.1 potassium 4.7, LDL ranging from 54-79.  Hemoglobin 15.1. -I will also check an echocardiogram to ensure proper structure and function. -Nitroglycerin as needed.  COPD -Dr. Roxy CedarYoung's note reviewed.  Continuing with Trelegy.  Hyperlipidemia -On high intensity statin, 80 mg of atorvastatin.  LDL is above.  Artery disease -Bruit heard on exam.  [facility].  He is on aspirin and statin.  We will follow up post catheterization.    Shared Decision Making/Informed Consent    The risks [stroke (1 in 1000), death (1 in 1000), kidney failure [usually temporary] (1 in 500), bleeding (1 in 200), allergic reaction [possibly serious] (1 in 200)], benefits (diagnostic support and management of coronary artery disease) and alternatives of a cardiac catheterization were discussed in detail with Mr. Cliffton AstersWhite and he is willing to proceed.        Medication Adjustments/Labs and Tests Ordered: Current medicines are reviewed at length with the patient today.  Concerns regarding medicines are outlined above.  Orders Placed This Encounter  Procedures  . CBC  . Basic metabolic panel  . EKG 12-Lead  . ECHOCARDIOGRAM COMPLETE   Meds ordered this encounter  Medications  . nitroGLYCERIN (NITROSTAT) 0.4 MG SL  tablet    Sig: Place 1 tablet (0.4 mg total) under the tongue every 5 (five) minutes as needed for chest pain.    Dispense:  25 tablet  Refill:  4    Patient Instructions  Medication Instructions:  The current medical regimen is effective;  continue present plan and medications. You may take SL Nitroglycerin as needed for chest pain.  *If you need a refill on your cardiac medications before your next appointment, please call your pharmacy*  Lab Work: Please have blood work today (CMP,BMP) If you have labs (blood work) drawn today and your tests are completely normal, you will receive your results only by: Marland Kitchen MyChart Message (if you have MyChart) OR . A paper copy in the mail If you have any lab test that is abnormal or we need to change your treatment, we will call you to review the results.   Testing/Procedures: Your physician has requested that you have an echocardiogram. Echocardiography is a painless test that uses sound waves to create images of your heart. It provides your doctor with information about the size and shape of your heart and how well your heart's chambers and valves are working. This procedure takes approximately one hour. There are no restrictions for this procedure.    [facility] CARDIOVASCULAR DIVISION [facility] ST OFFICE [address] [place], [address] [place] [address] Dept: [phone] Loc: [phone]  ZAYDIN BILLEY  10/09/2020  You are scheduled for a heart cath on Thursday October 17, 2020 with Dr. Okey Dupre.  1. Please arrive at the [facility] (Main Entrance A) at [facility]: [address] [place], [place] [address] at 5:30 am (two hours before your procedure to ensure your preparation). Free valet parking service is available.   Special note: Every effort is made to have your procedure done on time. Please understand that emergencies sometimes delay scheduled procedures.  2. Diet: nothing after  midnight  3. Labs: Have blood work today here.     Have Covid screen as scheduled.  4. Medication instructions in preparation for your procedure:  On the morning of your procedure, take your Aspirin and any morning medicines NOT listed above.  You may use sips of water.  5. Plan for one night stay--bring personal belongings. 6. Bring a current list of your medications and current insurance cards. 7. You MUST have a responsible person to drive you home. 8. Someone MUST be with you the first 24 hours after you arrive home or your discharge will be delayed. 9. Please wear clothes that are easy to get on and off and wear slip-on shoes.  Thank you for allowing [place] to care for you!   -- [facility]   Follow-Up: At [facility], you and your health needs are our priority.  As part of our continuing mission to provide you with exceptional heart care, we have created designated Provider Care Teams.  These Care Teams include your primary Cardiologist (physician) and Advanced Practice Providers (APPs -  Physician Assistants and Nurse Practitioners) who all work together to provide you with the care you need, when you need it.  We recommend signing up for the patient portal called "MyChart".  Sign up information is provided on this After Visit Summary.  MyChart is used to connect with patients for Virtual Visits (Telemedicine).  Patients are able to view lab/test results, encounter notes, upcoming appointments, etc.  Non-urgent messages can be sent to your provider as well.   To learn more about what you can do with MyChart, go to [email or web address].    Your next appointment:   2 month(s)  The format for  your next appointment:   In Person  Provider:   Donato Schultz, MD   Thank you for choosing [facility]!!        Signed, Donato Schultz, MD  10/10/2020 7:05 AM     Medical Group [facility]

## 2020-10-09 NOTE — Progress Notes (Signed)
Cardiology Office Note:    Date:  10/10/2020   ID:  Ryan Diaz, DOB [birth year], MRN [id number]  PCP:  Corwin Levins, MD  [facility] Cardiologist:  Donato Schultz, MD  [facility] Electrophysiologist:  None   Referring MD: Waymon Budge, MD     History of Present Illness:    Ryan Diaz is a 74 y.o. male duration of chest pain at the request of Dr. John/Dr. Fannie Knee of pulmonary.  In review of his prior note from 07/26/2020, was doing quite well but did have some shoulder discomfort.  Dr. Maple Hudson did receive a phone call stating that he was feeling some shortness of breath with exertion as well as chest tightness when he was having it. Originally it got better with COPD medication Trelegy.  Dr. Roxy Cedar office note reviewed from 09/23/2020 where he was concerned about a burning sensation in his chest with associated dyspnea on exertion. Certainly could be concomitant COPD as well as coronary disease.  Today he tells me when he is pulling out the garbage can or doing some yard work he will feel centralized chest tightness no radiation burning in his chest that when he sits down goes away.  Exertional anginal type symptoms.  This has been progressively getting worse over the past year.  Had [facility] neck Doppler - repeating next year.  He is compliant with his medications including atorvastatin, aspirin.  Past Medical History:  Diagnosis Date  . 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 ALLERGIC RHINITIS 08/08/2007  . ANXIETY 08/08/2007  . Carpal tunnel syndrome   . COPD 10/03/2009  . DEPRESSION 08/08/2007  . ERECTILE DYSFUNCTION 08/08/2007  . GENITAL HERPES, HX OF 08/08/2007  . GERD 08/08/2007  . GLUCOSE INTOLERANCE 08/08/2007  . HYPERLIPIDEMIA 08/08/2007  . HYPERTENSION 08/08/2007  . Impaired glucose tolerance 09/26/2011  . Insomnia 05/18/2012  . PAIN IN SOFT TISSUES OF LIMB 10/02/2009  . Sleep apnea    OSA-uses CPAP nightly    Past Surgical History:  Procedure Laterality  Date  . ACHILLES TENDON REPAIR Bilateral   . CARPAL TUNNEL RELEASE Right 08/18/2018   Procedure: RIGHT CARPAL TUNNEL RELEASE;  Surgeon: Betha Loa, MD;  Location: [facility];  Service: Orthopedics;  Laterality: Right;  . CARPAL TUNNEL RELEASE Left 11/21/2018   Procedure: LEFT CARPAL TUNNEL RELEASE;  Surgeon: Betha Loa, MD;  Location: [facility];  Service: Orthopedics;  Laterality: Left;  Bier block  . COLONOSCOPY      Current Medications: Current Meds  Medication Sig  . albuterol (PROVENTIL HFA;VENTOLIN HFA) 108 (90 BASE) MCG/ACT inhaler Inhale 2 puffs into the lungs every 6 (six) hours as needed for wheezing or shortness of breath.  Marland Kitchen aspirin EC 81 MG tablet Take 81 mg by mouth daily.  Marland Kitchen atorvastatin (LIPITOR) 80 MG tablet Take 1 tablet (80 mg total) by mouth daily.  . cetirizine (ZYRTEC) 10 MG tablet Take 1 tablet (10 mg total) by mouth daily.  . diclofenac sodium (VOLTAREN) 1 % GEL Apply 2 g topically 4 (four) times daily as needed.  . donepezil (ARICEPT) 5 MG tablet Take 1 tablet (5 mg total) by mouth at bedtime.  . fluocinonide ointment (LIDEX) 0.05 % Apply 1 application topically 2 (two) times daily.  . fluticasone (CUTIVATE) 0.05 % cream SMARTSIG:Topical 1 to 2 Times Daily PRN  . Fluticasone-Umeclidin-Vilant (TRELEGY ELLIPTA) 100-62.5-25 MCG/INH AEPB Use as directed 1 puff in the mouth or throat daily.  Marland Kitchen gabapentin (NEURONTIN) 100 MG capsule  Take 1 capsule (100 mg total) by mouth 3 (three) times daily.  Marland Kitchen ipratropium (ATROVENT) 0.06 % nasal spray Place 2 sprays into both nostrils 3 (three) times daily.  . meloxicam (MOBIC) 15 MG tablet 1 tab by mouth once daily as needed  . Omega-3 Fatty Acids (FISH OIL) 1000 MG CAPS Take 1,000 mg by mouth 2 (two) times daily.  Marland Kitchen omeprazole (PRILOSEC) 20 MG capsule Take 1 capsule (20 mg total) by mouth 2 (two) times daily.  Marland Kitchen terazosin (HYTRIN) 2 MG capsule Take 1 capsule (2 mg total) by mouth at bedtime.  .  triamcinolone cream (KENALOG) 0.1 % SMARTSIG:Topical 1 to 2 Times Daily PRN     Allergies:   Patient has no known allergies.   Social History   Socioeconomic History  . Marital status: Single    Spouse name: Not on file  . Number of children: Not on file  . Years of education: Not on file  . Highest education level: Not on file  Occupational History  . 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 Smoking status: Former Smoker    Packs/day: 1.50    Years: 35.00    Pack years: 52.50    Types: Cigarettes    Quit date: 09/04/2009    Years since quitting: 11.1  . Smokeless tobacco: Never Used  Vaping Use  . Vaping Use: Never used  Substance and Sexual Activity  . Alcohol use: Yes    Alcohol/week: 46.0 - 53.0 standard drinks    Types: 28 - 35 Cans of beer, 18 Standard drinks or equivalent per week    Comment: everyday drinker amount varies   . Drug use: Yes    Types: Marijuana    Comment: marijuana occassionally  . Sexual activity: Yes  Other Topics Concern  . Not on file  Social History Narrative  . Not on file   Social Determinants of Health   Financial Resource Strain: Low Risk   . Difficulty of Paying Living Expenses: Not hard at all  Food Insecurity: No Food Insecurity  . Worried About Programme researcher, broadcasting/film/video in the Last Year: Never true  . Ran Out of Food in the Last Year: Never true  Transportation Needs: No Transportation Needs  . Lack of Transportation (Medical): No  . Lack of Transportation (Non-Medical): No  Physical Activity: Inactive  . Days of Exercise per Week: 0 days  . Minutes of Exercise per Session: 0 min  Stress: No Stress Concern Present  . Feeling of Stress : Not at all  Social Connections: Unknown  . Frequency of Communication with Friends and Family: Patient refused  . Frequency of Social Gatherings with Friends and Family: Patient refused  . Attends Religious Services: Patient refused  . Active Member of Clubs or Organizations:  Patient refused  . Attends Banker Meetings: Patient refused  . Marital Status: Patient refused     Family History: The patient's family history includes Colon polyps in his brother.  ROS:   Please see the history of present illness.    No bleeding no fevers no chills no nausea no vomiting.  All other systems reviewed and are negative.  EKGs/Labs/Other Studies Reviewed:    The following studies were reviewed today:  Echo 07/17/2015: - Left ventricle: The cavity size was normal. Systolic function was  normal. The estimated ejection fraction was in the range of 55%  to 60%. Wall motion was normal; there were no regional wall  motion  abnormalities.   EKG:  EKG is  ordered today.  The ekg ordered today demonstrates sinus rhythm 90 with nonspecific ST-T wave changes, poor R wave progression  Recent Labs: 03/27/2020: ALT 28; TSH 0.84 10/09/2020: BUN 24; Creatinine, Ser 1.04; Hemoglobin 15.7; Platelets 327; Potassium 4.8; Sodium 138  Recent Lipid Panel    Component Value Date/Time   CHOL 148 03/27/2020 1118   TRIG 155.0 (H) 03/27/2020 1118   HDL 37.40 (L) 03/27/2020 1118   CHOLHDL 4 03/27/2020 1118   VLDL 31.0 03/27/2020 1118   LDLCALC 79 03/27/2020 1118   LDLDIRECT 83.0 10/19/2018 1048     Risk Assessment/Calculations:       Physical Exam:    VS:  BP 136/70   Pulse 90   Ht 5\' 11"  (1.803 m)   Wt 168 lb (76.2 kg)   SpO2 94%   BMI 23.43 kg/m     Wt Readings from Last 3 Encounters:  10/09/20 168 lb (76.2 kg)  10/04/20 169 lb (76.7 kg)  10/03/20 169 lb (76.7 kg)    GEN:  Well nourished, well developed in no acute distress HEENT: Right neck carotid bruit NECK: No JVD; No carotid bruits LYMPHATICS: No lymphadenopathy CARDIAC: RRR, no murmurs, rubs, gallops RESPIRATORY:  Clear to auscultation without rales, wheezing or rhonchi  ABDOMEN: Soft, non-tender, non-distended MUSCULOSKELETAL:  No edema; No deformity  SKIN: Warm and dry NEUROLOGIC:  Alert  and oriented x 3 PSYCHIATRIC:  Normal affect   ASSESSMENT:    1. Accelerating angina (HCC)   2. Pure hypercholesterolemia   3. Essential hypertension   4. Stenosis of right carotid artery   5. Pre-procedure lab exam    PLAN:    In order of problems listed above:  Accelerating angina -Symptoms are classic for exertional anginal symptoms.  Giving his longstanding smoking history of approximately 40 years with concomitant COPD, hyperlipidemia we will go ahead and proceed with cardiac catheterization given high pretest probability.  Risks benefits have been explained including stroke heart attack death renal impairment bleeding.  He is willing to proceed. -Last creatinine was 1.1 potassium 4.7, LDL ranging from 54-79.  Hemoglobin 15.1. -I will also check an echocardiogram to ensure proper structure and function. -Nitroglycerin as needed.  COPD -Dr. Roxy CedarYoung's note reviewed.  Continuing with Trelegy.  Hyperlipidemia -On high intensity statin, 80 mg of atorvastatin.  LDL is above.  Artery disease -Bruit heard on exam.  [facility].  He is on aspirin and statin.  We will follow up post catheterization.    Shared Decision Making/Informed Consent    The risks [stroke (1 in 1000), death (1 in 1000), kidney failure [usually temporary] (1 in 500), bleeding (1 in 200), allergic reaction [possibly serious] (1 in 200)], benefits (diagnostic support and management of coronary artery disease) and alternatives of a cardiac catheterization were discussed in detail with Ryan Diaz and he is willing to proceed.        Medication Adjustments/Labs and Tests Ordered: Current medicines are reviewed at length with the patient today.  Concerns regarding medicines are outlined above.  Orders Placed This Encounter  Procedures  . CBC  . Basic metabolic panel  . EKG 12-Lead  . ECHOCARDIOGRAM COMPLETE   Meds ordered this encounter  Medications  . nitroGLYCERIN (NITROSTAT) 0.4 MG SL  tablet    Sig: Place 1 tablet (0.4 mg total) under the tongue every 5 (five) minutes as needed for chest pain.    Dispense:  25 tablet  Refill:  4    Patient Instructions  Medication Instructions:  The current medical regimen is effective;  continue present plan and medications. You may take SL Nitroglycerin as needed for chest pain.  *If you need a refill on your cardiac medications before your next appointment, please call your pharmacy*  Lab Work: Please have blood work today (CMP,BMP) If you have labs (blood work) drawn today and your tests are completely normal, you will receive your results only by: Marland Kitchen MyChart Message (if you have MyChart) OR . A paper copy in the mail If you have any lab test that is abnormal or we need to change your treatment, we will call you to review the results.   Testing/Procedures: Your physician has requested that you have an echocardiogram. Echocardiography is a painless test that uses sound waves to create images of your heart. It provides your doctor with information about the size and shape of your heart and how well your heart's chambers and valves are working. This procedure takes approximately one hour. There are no restrictions for this procedure.    [facility] CARDIOVASCULAR DIVISION [facility] ST OFFICE [address] [place], [address] [place] [address] Dept: [phone] Loc: [phone]  Ryan Diaz  10/09/2020  You are scheduled for a heart cath on Thursday October 17, 2020 with Dr. Okey Dupre.  1. Please arrive at the [facility] (Main Entrance A) at [facility]: [address] [place], [place] [address] at 5:30 am (two hours before your procedure to ensure your preparation). Free valet parking service is available.   Special note: Every effort is made to have your procedure done on time. Please understand that emergencies sometimes delay scheduled procedures.  2. Diet: nothing after  midnight  3. Labs: Have blood work today here.     Have Covid screen as scheduled.  4. Medication instructions in preparation for your procedure:  On the morning of your procedure, take your Aspirin and any morning medicines NOT listed above.  You may use sips of water.  5. Plan for one night stay--bring personal belongings. 6. Bring a current list of your medications and current insurance cards. 7. You MUST have a responsible person to drive you home. 8. Someone MUST be with you the first 24 hours after you arrive home or your discharge will be delayed. 9. Please wear clothes that are easy to get on and off and wear slip-on shoes.  Thank you for allowing [place] to care for you!   -- [facility]   Follow-Up: At [facility], you and your health needs are our priority.  As part of our continuing mission to provide you with exceptional heart care, we have created designated Provider Care Teams.  These Care Teams include your primary Cardiologist (physician) and Advanced Practice Providers (APPs -  Physician Assistants and Nurse Practitioners) who all work together to provide you with the care you need, when you need it.  We recommend signing up for the patient portal called "MyChart".  Sign up information is provided on this After Visit Summary.  MyChart is used to connect with patients for Virtual Visits (Telemedicine).  Patients are able to view lab/test results, encounter notes, upcoming appointments, etc.  Non-urgent messages can be sent to your provider as well.   To learn more about what you can do with MyChart, go to [email or web address].    Your next appointment:   2 month(s)  The format for  your next appointment:   In Person  Provider:   Phaedra Colgate, MD   Thank you for choosing [facility]!!        Signed, Sharnell Knight, MD  10/10/2020 7:05 AM    [facility] 

## 2020-10-15 ENCOUNTER — Other Ambulatory Visit (HOSPITAL_COMMUNITY)
Admission: RE | Admit: 2020-10-15 | Discharge: 2020-10-15 | Disposition: A | Discharge status: Home / Self Care | Payer: PPO | Source: Ambulatory Visit | Attending: Internal Medicine | Admitting: Internal Medicine

## 2020-10-15 DIAGNOSIS — Z20822 Contact with and (suspected) exposure to covid-19: Secondary | ICD-10-CM | POA: Diagnosis not present

## 2020-10-15 DIAGNOSIS — Z01812 Encounter for preprocedural laboratory examination: Secondary | ICD-10-CM | POA: Insufficient documentation

## 2020-10-15 LAB — SARS CORONAVIRUS 2 (TAT 6-24 HRS): SARS Coronavirus 2: NEGATIVE

## 2020-10-16 ENCOUNTER — Ambulatory Visit (INDEPENDENT_AMBULATORY_CARE_PROVIDER_SITE_OTHER): Payer: PPO | Admitting: Internal Medicine

## 2020-10-16 ENCOUNTER — Other Ambulatory Visit: Payer: Self-pay

## 2020-10-16 ENCOUNTER — Telehealth: Payer: Self-pay | Admitting: *Deleted

## 2020-10-16 DIAGNOSIS — J441 Chronic obstructive pulmonary disease with (acute) exacerbation: Secondary | ICD-10-CM | POA: Diagnosis not present

## 2020-10-16 LAB — PULMONARY FUNCTION TEST
DL/VA % pred: 58 %
DL/VA: 2.33 ml/min/mmHg/L
DLCO cor % pred: 53 %
DLCO cor: 13.41 ml/min/mmHg
DLCO unc % pred: 54 %
DLCO unc: 13.81 ml/min/mmHg
FEF 25-75 Post: 1.05 L/sec
FEF 25-75 Pre: 0.73 L/sec
FEF2575-%Change-Post: 42 %
FEF2575-%Pred-Post: 45 %
FEF2575-%Pred-Pre: 31 %
FEV1-%Change-Post: 21 %
FEV1-%Pred-Post: 69 %
FEV1-%Pred-Pre: 57 %
FEV1-Post: 1.95 L
FEV1-Pre: 1.61 L
FEV1FVC-%Change-Post: 10 %
FEV1FVC-%Pred-Pre: 68 %
FEV6-%Change-Post: 10 %
FEV6-%Pred-Post: 92 %
FEV6-%Pred-Pre: 83 %
FEV6-Post: 3.3 L
FEV6-Pre: 2.99 L
FEV6FVC-%Change-Post: 0 %
FEV6FVC-%Pred-Post: 101 %
FEV6FVC-%Pred-Pre: 101 %
FVC-%Change-Post: 9 %
FVC-%Pred-Post: 90 %
FVC-%Pred-Pre: 82 %
FVC-Post: 3.4 L
FVC-Pre: 3.1 L
Post FEV1/FVC ratio: 57 %
Post FEV6/FVC ratio: 97 %
Pre FEV1/FVC ratio: 52 %
Pre FEV6/FVC Ratio: 96 %
RV % pred: 156 %
RV: 3.97 L
TLC % pred: 100 %
TLC: 7.05 L

## 2020-10-16 NOTE — Progress Notes (Signed)
PFT done today. 

## 2020-10-16 NOTE — Telephone Encounter (Signed)
Pt contacted pre-catheterization scheduled at [facility] for: Thursday October 17, 2020 7:30 AM Verified arrival time and place: [facility] Main Entrance A [facility]) at: 5:30 AM   No solid food after midnight prior to cath, clear liquids until 5 AM day of procedure.  Hold: Cialis-until post procedure  AM meds can be  taken pre-cath with sips of water including: ASA 81 mg   Confirmed patient has responsible adult to drive home post procedure and be with patient first 24 hours after arriving home: yes  You are allowed ONE visitor in the waiting room during the time you are at the hospital for your procedure. Both you and your visitor must wear a mask once you enter the hospital.       COVID-19 Pre-Screening Questions:  . In the past 14 days have you had any symptoms concerning for COVID-19 infection (fever, chills, cough, or new shortness of breath)? no . In the past 14 days have you been around anyone with known Covid 19? No   Reviewed procedure/mask/visitor instructions, COVID-19 questions with patient.

## 2020-10-17 ENCOUNTER — Encounter (HOSPITAL_COMMUNITY): Payer: Self-pay | Admitting: Internal Medicine

## 2020-10-17 ENCOUNTER — Ambulatory Visit (HOSPITAL_COMMUNITY)
Admission: RE | Disposition: A | Discharge status: Home / Self Care | Payer: Self-pay | Source: Home / Self Care | Attending: Internal Medicine

## 2020-10-17 ENCOUNTER — Ambulatory Visit (HOSPITAL_COMMUNITY)
Admission: RE | Admit: 2020-10-17 | Discharge: 2020-10-17 | Disposition: A | Discharge status: Home / Self Care | Payer: PPO | Attending: Internal Medicine | Admitting: Internal Medicine

## 2020-10-17 DIAGNOSIS — Z79899 Other long term (current) drug therapy: Secondary | ICD-10-CM | POA: Diagnosis not present

## 2020-10-17 DIAGNOSIS — I200 Unstable angina: Secondary | ICD-10-CM | POA: Diagnosis present

## 2020-10-17 DIAGNOSIS — I6521 Occlusion and stenosis of right carotid artery: Secondary | ICD-10-CM

## 2020-10-17 DIAGNOSIS — I25110 Atherosclerotic heart disease of native coronary artery with unstable angina pectoris: Secondary | ICD-10-CM | POA: Diagnosis not present

## 2020-10-17 DIAGNOSIS — J449 Chronic obstructive pulmonary disease, unspecified: Secondary | ICD-10-CM | POA: Insufficient documentation

## 2020-10-17 DIAGNOSIS — E785 Hyperlipidemia, unspecified: Secondary | ICD-10-CM | POA: Insufficient documentation

## 2020-10-17 DIAGNOSIS — I25118 Atherosclerotic heart disease of native coronary artery with other forms of angina pectoris: Secondary | ICD-10-CM | POA: Insufficient documentation

## 2020-10-17 DIAGNOSIS — R0602 Shortness of breath: Secondary | ICD-10-CM | POA: Diagnosis present

## 2020-10-17 DIAGNOSIS — Z7982 Long term (current) use of aspirin: Secondary | ICD-10-CM | POA: Diagnosis not present

## 2020-10-17 DIAGNOSIS — I10 Essential (primary) hypertension: Secondary | ICD-10-CM | POA: Insufficient documentation

## 2020-10-17 DIAGNOSIS — Z01812 Encounter for preprocedural laboratory examination: Secondary | ICD-10-CM

## 2020-10-17 DIAGNOSIS — E7800 Pure hypercholesterolemia, unspecified: Secondary | ICD-10-CM | POA: Insufficient documentation

## 2020-10-17 HISTORY — PX: LEFT HEART CATH AND CORONARY ANGIOGRAPHY: CATH118249

## 2020-10-17 SURGERY — LEFT HEART CATH AND CORONARY ANGIOGRAPHY
Anesthesia: LOCAL

## 2020-10-17 MED ORDER — LIDOCAINE HCL (PF) 1 % IJ SOLN
INTRAMUSCULAR | Status: AC
  Filled 2020-10-17: qty 30

## 2020-10-17 MED ORDER — FENTANYL CITRATE (PF) 100 MCG/2ML IJ SOLN
INTRAMUSCULAR | Status: AC
  Filled 2020-10-17: qty 2

## 2020-10-17 MED ORDER — SODIUM CHLORIDE 0.9 % IV SOLN: 250.0000 mL | INTRAVENOUS | Status: DC | PRN

## 2020-10-17 MED ORDER — MIDAZOLAM HCL 2 MG/2ML IJ SOLN
INTRAMUSCULAR | Status: DC | PRN
  Administered 2020-10-17: 1 mg via INTRAVENOUS

## 2020-10-17 MED ORDER — HYDRALAZINE HCL 20 MG/ML IJ SOLN: 10.0000 mg | INTRAMUSCULAR | Status: DC | PRN

## 2020-10-17 MED ORDER — SODIUM CHLORIDE 0.9% FLUSH: 3.0000 mL | Freq: Two times a day (BID) | INTRAVENOUS | Status: DC

## 2020-10-17 MED ORDER — SODIUM CHLORIDE 0.9 % WEIGHT BASED INFUSION: 1.0000 mL/kg/h | INTRAVENOUS | Status: DC

## 2020-10-17 MED ORDER — HEPARIN SODIUM (PORCINE) 1000 UNIT/ML IJ SOLN
INTRAMUSCULAR | Status: DC | PRN
  Administered 2020-10-17: 4000 [IU] via INTRAVENOUS

## 2020-10-17 MED ORDER — SODIUM CHLORIDE 0.9 % WEIGHT BASED INFUSION
3.0000 mL/kg/h | INTRAVENOUS | Status: AC
  Administered 2020-10-17: 3 mL/kg/h via INTRAVENOUS

## 2020-10-17 MED ORDER — ACETAMINOPHEN 325 MG PO TABS: 650.0000 mg | ORAL_TABLET | ORAL | Status: DC | PRN

## 2020-10-17 MED ORDER — SODIUM CHLORIDE 0.9% FLUSH: 3.0000 mL | INTRAVENOUS | Status: DC | PRN

## 2020-10-17 MED ORDER — SODIUM CHLORIDE 0.9 % IV SOLN: INTRAVENOUS | Status: DC

## 2020-10-17 MED ORDER — VERAPAMIL HCL 2.5 MG/ML IV SOLN
INTRAVENOUS | Status: AC
  Filled 2020-10-17: qty 2

## 2020-10-17 MED ORDER — HEPARIN (PORCINE) IN NACL 1000-0.9 UT/500ML-% IV SOLN
INTRAVENOUS | Status: DC | PRN
  Administered 2020-10-17 (×2): 500 mL

## 2020-10-17 MED ORDER — LABETALOL HCL 5 MG/ML IV SOLN: 10.0000 mg | INTRAVENOUS | Status: DC | PRN

## 2020-10-17 MED ORDER — IOHEXOL 350 MG/ML SOLN
INTRAVENOUS | Status: DC | PRN
  Administered 2020-10-17: 50 mL via INTRA_ARTERIAL

## 2020-10-17 MED ORDER — HEPARIN (PORCINE) IN NACL 1000-0.9 UT/500ML-% IV SOLN
INTRAVENOUS | Status: AC
  Filled 2020-10-17: qty 1000

## 2020-10-17 MED ORDER — LIDOCAINE HCL (PF) 1 % IJ SOLN
INTRAMUSCULAR | Status: DC | PRN
  Administered 2020-10-17: 2 mL via INTRADERMAL

## 2020-10-17 MED ORDER — MIDAZOLAM HCL 2 MG/2ML IJ SOLN
INTRAMUSCULAR | Status: AC
  Filled 2020-10-17: qty 2

## 2020-10-17 MED ORDER — ASPIRIN 81 MG PO CHEW: 81.0000 mg | CHEWABLE_TABLET | ORAL | Status: DC

## 2020-10-17 MED ORDER — HEPARIN SODIUM (PORCINE) 1000 UNIT/ML IJ SOLN
INTRAMUSCULAR | Status: AC
  Filled 2020-10-17: qty 1

## 2020-10-17 MED ORDER — FENTANYL CITRATE (PF) 100 MCG/2ML IJ SOLN
INTRAMUSCULAR | Status: DC | PRN
  Administered 2020-10-17: 25 ug via INTRAVENOUS

## 2020-10-17 MED ORDER — ONDANSETRON HCL 4 MG/2ML IJ SOLN: 4.0000 mg | Freq: Four times a day (QID) | INTRAMUSCULAR | Status: DC | PRN

## 2020-10-17 SURGICAL SUPPLY — 10 items
CATH 5FR JL3.5 JR4 ANG PIG MP (CATHETERS) ×2 IMPLANT
DEVICE RAD COMP TR BAND LRG (VASCULAR PRODUCTS) ×2 IMPLANT
GLIDESHEATH SLEND SS 6F .021 (SHEATH) ×2 IMPLANT
GUIDEWIRE INQWIRE 1.5J.035X260 (WIRE) ×1 IMPLANT
INQWIRE 1.5J .035X260CM (WIRE) ×2
KIT HEART LEFT (KITS) ×2 IMPLANT
PACK CARDIAC CATHETERIZATION (CUSTOM PROCEDURE TRAY) ×2 IMPLANT
SHEATH PROBE COVER 6X72 (BAG) ×2 IMPLANT
TRANSDUCER W/STOPCOCK (MISCELLANEOUS) ×2 IMPLANT
TUBING CIL FLEX 10 FLL-RA (TUBING) ×2 IMPLANT

## 2020-10-17 NOTE — Interval H&P Note (Signed)
History and Physical Interval Note:  10/17/2020 7:16 AM  Ryan Diaz  has presented today for surgery, with the diagnosis of accelerating angina.  The various methods of treatment have been discussed with the patient and family. After consideration of risks, benefits and other options for treatment, the patient has consented to  Procedure(s): LEFT HEART CATH AND CORONARY ANGIOGRAPHY (N/A) as a surgical intervention.  The patient's history has been reviewed, patient examined, no change in status, stable for surgery.  I have reviewed the patient's chart and labs.  Questions were answered to the patient's satisfaction.    Cath Lab Visit (complete for each Cath Lab visit)  Clinical Evaluation Leading to the Procedure:   ACS: No.  Non-ACS:    Anginal Classification: CCS III  Anti-ischemic medical therapy: No Therapy  Non-Invasive Test Results: No non-invasive testing performed  Prior CABG: No previous CABG  [name]

## 2020-10-17 NOTE — Progress Notes (Signed)
Discharge instructions reviewed with patient and family. Verbalized understandigng

## 2020-10-17 NOTE — Discharge Instructions (Signed)
Radial Site Care  This sheet gives you information about how to care for yourself after your procedure. Your health care provider may also give you more specific instructions. If you have problems or questions, contact your health care provider. What can I expect after the procedure? After the procedure, it is common to have:  Bruising and tenderness at the catheter insertion area. Follow these instructions at home: Medicines  Take over-the-counter and prescription medicines only as told by your health care provider. Insertion site care  Follow instructions from your health care provider about how to take care of your insertion site. Make sure you: ? Wash your hands with soap and water before you change your bandage (dressing). If soap and water are not available, use hand sanitizer. ? Change your dressing as told by your health care provider. ? Leave stitches (sutures), skin glue, or adhesive strips in place. These skin closures may need to stay in place for 2 weeks or longer. If adhesive strip edges start to loosen and curl up, you may trim the loose edges. Do not remove adhesive strips completely unless your health care provider tells you to do that.  Check your insertion site every day for signs of infection. Check for: ? Redness, swelling, or pain. ? Fluid or blood. ? Pus or a bad smell. ? Warmth.  Do not take baths, swim, or use a hot tub until your health care provider approves.  You may shower 24-48 hours after the procedure, or as directed by your health care provider. ? Remove the dressing and gently wash the site with plain soap and water. ? Pat the area dry with a clean towel. ? Do not rub the site. That could cause bleeding.  Do not apply powder or lotion to the site. Activity   For 24 hours after the procedure, or as directed by your health care provider: ? Do not flex or bend the affected arm. ? Do not push or pull heavy objects with the affected arm. ? Do not  drive yourself home from the hospital or clinic. You may drive 24 hours after the procedure unless your health care provider tells you not to. ? Do not operate machinery or power tools.  Do not lift anything that is heavier than 10 lb (4.5 kg), or the limit that you are told, until your health care provider says that it is safe.  Ask your health care provider when it is okay to: ? Return to work or school. ? Resume usual physical activities or sports. ? Resume sexual activity. General instructions  If the catheter site starts to bleed, raise your arm and put firm pressure on the site. If the bleeding does not stop, get help right away. This is a medical emergency.  If you went home on the same day as your procedure, a responsible adult should be with you for the first 24 hours after you arrive home.  Keep all follow-up visits as told by your health care provider. This is important. Contact a health care provider if:  You have a fever.  You have redness, swelling, or yellow drainage around your insertion site. Get help right away if:  You have unusual pain at the radial site.  The catheter insertion area swells very fast.  The insertion area is bleeding, and the bleeding does not stop when you hold steady pressure on the area.  Your arm or hand becomes pale, cool, tingly, or numb. These symptoms may represent a serious problem   that is an emergency. Do not wait to see if the symptoms will go away. Get medical help right away. Call your local emergency services (911 in the [place] Do not drive yourself to the hospital. Summary  After the procedure, it is common to have bruising and tenderness at the site.  Follow instructions from your health care provider about how to take care of your radial site wound. Check the wound every day for signs of infection.  Do not lift anything that is heavier than 10 lb (4.5 kg), or the limit that you are told, until your health care provider says  that it is safe. This information is not intended to replace advice given to you by your health care provider. Make sure you discuss any questions you have with your health care provider. Document Revised: 12/08/2017 Document Reviewed: 12/08/2017 Elsevier Patient Education  2020 Elsevier Inc.  

## 2020-10-22 MED FILL — Verapamil HCl IV Soln 2.5 MG/ML: INTRAVENOUS | Qty: 2 | Status: AC

## 2020-10-24 ENCOUNTER — Telehealth: Payer: Self-pay | Admitting: Internal Medicine

## 2020-10-24 NOTE — Telephone Encounter (Signed)
Called and spoke with Patient.  Patient stated he had a PFT 10/16/20 and was wanting to know if Dr. Maple Hudson has results for him? Explained we will send a message to Dr. Maple Hudson and someone will call him with Dr. Roxy Cedar results and recommendations.  Message routed to Dr. Maple Hudson   No Known Allergies Current Outpatient Medications on File Prior to Visit  Medication Sig Dispense Refill  . albuterol (PROVENTIL HFA;VENTOLIN HFA) 108 (90 BASE) MCG/ACT inhaler Inhale 2 puffs into the lungs every 6 (six) hours as needed for wheezing or shortness of breath.    Marland Kitchen aspirin EC 81 MG tablet Take 81 mg by mouth daily.    Marland Kitchen atorvastatin (LIPITOR) 80 MG tablet Take 1 tablet (80 mg total) by mouth daily. 90 tablet 3  . Cholecalciferol (VITAMIN D) 50 MCG (2000 UT) tablet Take 2,000 Units by mouth daily.    . clotrimazole (LOTRIMIN) 1 % cream Apply 1 application topically 2 (two) times daily as needed (irritation).    Marland Kitchen diclofenac sodium (VOLTAREN) 1 % GEL Apply 2 g topically 4 (four) times daily as needed. (Patient taking differently: Apply 2 g topically 4 (four) times daily as needed (pain). ) 100 g 5  . docusate sodium (COLACE) 100 MG capsule Take 100 mg by mouth daily as needed for mild constipation.    Marland Kitchen donepezil (ARICEPT) 5 MG tablet Take 1 tablet (5 mg total) by mouth at bedtime. 90 tablet 5  . Ferrous Sulfate 27 MG TABS Take 27 mg by mouth daily as needed (pt prefrence).    . fluocinonide ointment (LIDEX) 0.05 % Apply 1 application topically 2 (two) times daily. (Patient not taking: Reported on 10/14/2020) 30 g 2  . fluticasone (CUTIVATE) 0.05 % cream Apply 1 application topically daily as needed (irritation).     . Fluticasone-Umeclidin-Vilant (TRELEGY ELLIPTA) 100-62.5-25 MCG/INH AEPB Use as directed 1 puff in the mouth or throat daily. (Patient taking differently: Inhale 1 puff into the lungs daily. ) 60 each 12  . gabapentin (NEURONTIN) 100 MG capsule Take 1 capsule (100 mg total) by mouth 3 (three) times  daily. (Patient taking differently: Take 100 mg by mouth 3 (three) times daily as needed (pain). ) 90 capsule 5  . ipratropium (ATROVENT) 0.06 % nasal spray Place 2 sprays into both nostrils 3 (three) times daily. (Patient not taking: Reported on 10/14/2020) 15 mL 5  . meloxicam (MOBIC) 15 MG tablet 1 tab by mouth once daily as needed (Patient taking differently: Take 15 mg by mouth daily as needed for pain. 1 tab by mouth once daily as needed) 90 tablet 1  . nitroGLYCERIN (NITROSTAT) 0.4 MG SL tablet Place 1 tablet (0.4 mg total) under the tongue every 5 (five) minutes as needed for chest pain. 25 tablet 4  . Omega-3 Fatty Acids (FISH OIL) 1000 MG CAPS Take 1,000 mg by mouth 2 (two) times daily.    Marland Kitchen omeprazole (PRILOSEC) 20 MG capsule Take 1 capsule (20 mg total) by mouth 2 (two) times daily. (Patient taking differently: Take 20 mg by mouth 2 (two) times daily as needed (acid reflux). ) 90 capsule 3  . tadalafil (CIALIS) 20 MG tablet Take 20 mg by mouth daily as needed for erectile dysfunction.    Marland Kitchen terazosin (HYTRIN) 2 MG capsule Take 1 capsule (2 mg total) by mouth at bedtime. 90 capsule 3  . traZODone (DESYREL) 50 MG tablet Take 50 mg by mouth at bedtime as needed for sleep.     No  current facility-administered medications on file prior to visit.

## 2020-10-24 NOTE — Telephone Encounter (Signed)
His pulmonary function test showed moderately severe obstructive airways disease.- like  COPD There was good response to the kind of medicines in his inhalers.

## 2020-10-24 NOTE — Telephone Encounter (Signed)
Called and spoke with patient, advised of results and recommendations per Dr. Wert.  He verbalized understanding. Nothing further needed. 

## 2020-11-01 ENCOUNTER — Ambulatory Visit: Payer: PPO | Admitting: Orthopaedic Surgery

## 2020-11-07 ENCOUNTER — Ambulatory Visit (INDEPENDENT_AMBULATORY_CARE_PROVIDER_SITE_OTHER): Payer: PPO | Admitting: Orthopaedic Surgery

## 2020-11-07 ENCOUNTER — Encounter: Payer: Self-pay | Admitting: Orthopaedic Surgery

## 2020-11-07 ENCOUNTER — Other Ambulatory Visit: Payer: Self-pay

## 2020-11-07 VITALS — Ht 71.0 in | Wt 171.0 lb

## 2020-11-07 DIAGNOSIS — M75101 Unspecified rotator cuff tear or rupture of right shoulder, not specified as traumatic: Secondary | ICD-10-CM

## 2020-11-07 DIAGNOSIS — IMO0002 Reserved for concepts with insufficient information to code with codable children: Secondary | ICD-10-CM

## 2020-11-07 NOTE — Progress Notes (Signed)
Office Visit Note   Patient: Ryan Diaz           Date of Birth: 1945-12-18           MRN: [id number] Visit Date: 11/07/2020              Requested by: Corwin Levins, MD [address] [place],  [place] [address] PCP: Corwin Levins, MD   Assessment & Plan: Visit Diagnoses:  1. Disorder of rotator cuff syndrome of right shoulder and allied disorder     Plan: He states that he has learned to live with the shoulder pain and to compensate for it.  He is pleased with how well the shot has worked.  For now he will just do some strengthening exercises that we provided him today.  He will let us know if he needs another injection in the future.  Follow-Up Instructions: Return if symptoms worsen or fail to improve.   Orders:  No orders of the defined types were placed in this encounter.  No orders of the defined types were placed in this encounter.     Procedures: No procedures performed   Clinical Data: No additional findings.   Subjective: Chief Complaint  Patient presents with  . Right Shoulder - Follow-up    Mr. Ramone returns today for follow-up of right shoulder pain.  He received a subacromial injection a month ago.  He states that he is feeling much better and not taking any medications.  No complaints today.   Review of Systems   Objective: Vital Signs: Ht 5\' 11"  (1.803 m)   Wt 171 lb (77.6 kg)   BMI 23.85 kg/m   Physical Exam  Ortho Exam Right shoulder exam shows much better active range of motion without any significant pain. Specialty Comments:  No specialty comments available.  Imaging: No results found.   PMFS History: Patient Active Problem List   Diagnosis Date Noted  . Shortness of breath 10/17/2020  . Accelerating angina (HCC) 10/17/2020  . Disorder of rotator cuff syndrome of right shoulder and allied disorder 10/04/2020  . Acute pain of right shoulder 07/27/2020  . Vitamin D deficiency 06/01/2020  . Weight loss 09/19/2019  .  Rash 03/27/2019  . Right foot pain 12/08/2018  . Blurred vision, bilateral 10/19/2018  . Bilateral carpal tunnel syndrome 07/11/2018  . Tenosynovitis of hand 07/11/2018  . Right ankle pain 03/09/2018  . Facial rash 10/16/2017  . Bruising 02/11/2017  . Cough 12/16/2016  . Wheezing 12/16/2016  . Memory dysfunction 07/15/2016  . Dizziness 07/10/2015  . Mastoiditis 07/10/2015  . Fatigue 07/10/2015  . Abnormal TSH 07/10/2015  . Pulsatile tinnitus of right ear 01/23/2015  . Renal insufficiency 01/09/2015  . Bilateral ankle pain 04/10/2014  . OSA (obstructive sleep apnea) 09/27/2013  . Right carpal tunnel syndrome 07/06/2013  . Insomnia 05/18/2012  . Nocturia 11/18/2011  . Impaired glucose tolerance 09/26/2011  . Preventative health care 09/26/2011  . COPD mixed type (HCC) 10/03/2009  . Hyperlipidemia 08/08/2007  . Anxiety state 08/08/2007  . ERECTILE DYSFUNCTION 08/08/2007  . 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 Depression 08/08/2007  . Essential hypertension 08/08/2007  . Allergic rhinitis 08/08/2007  . GERD 08/08/2007  . GENITAL HERPES, HX OF 08/08/2007   Past Medical History:  Diagnosis Date  . 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 ALLERGIC RHINITIS 08/08/2007  . ANXIETY 08/08/2007  . Carpal tunnel syndrome   . COPD 10/03/2009  . DEPRESSION 08/08/2007  . ERECTILE DYSFUNCTION  08/08/2007  . GENITAL HERPES, HX OF 08/08/2007  . GERD 08/08/2007  . GLUCOSE INTOLERANCE 08/08/2007  . HYPERLIPIDEMIA 08/08/2007  . HYPERTENSION 08/08/2007  . Impaired glucose tolerance 09/26/2011  . Insomnia 05/18/2012  . PAIN IN SOFT TISSUES OF LIMB 10/02/2009  . Sleep apnea    OSA-uses CPAP nightly    Family History  Problem Relation Age of Onset  . Colon polyps Brother     Past Surgical History:  Procedure Laterality Date  . ACHILLES TENDON REPAIR Bilateral   . CARPAL TUNNEL RELEASE Right 08/18/2018   Procedure: RIGHT CARPAL TUNNEL RELEASE;  Surgeon: Betha Loa, MD;  Location: [facility];  Service: Orthopedics;  Laterality: Right;  . CARPAL TUNNEL RELEASE Left 11/21/2018   Procedure: LEFT CARPAL TUNNEL RELEASE;  Surgeon: Betha Loa, MD;  Location: [facility];  Service: Orthopedics;  Laterality: Left;  Bier block  . COLONOSCOPY    . LEFT HEART CATH AND CORONARY ANGIOGRAPHY N/A 10/17/2020   Procedure: LEFT HEART CATH AND CORONARY ANGIOGRAPHY;  Surgeon: Yvonne Kendall, MD;  Location: [facility];  Service: Cardiovascular;  Laterality: N/A;   Social History   Occupational History  . 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 Smoking status: Former Smoker    Packs/day: 1.50    Years: 35.00    Pack years: 52.50    Types: Cigarettes    Quit date: 09/04/2009    Years since quitting: 11.1  . Smokeless tobacco: Never Used  Vaping Use  . Vaping Use: Never used  Substance and Sexual Activity  . Alcohol use: Yes    Alcohol/week: 46.0 - 53.0 standard drinks    Types: 28 - 35 Cans of beer, 18 Standard drinks or equivalent per week    Comment: everyday drinker amount varies   . Drug use: Yes    Types: Marijuana    Comment: marijuana occassionally  . Sexual activity: Yes

## 2020-11-11 ENCOUNTER — Other Ambulatory Visit: Payer: Self-pay

## 2020-11-11 ENCOUNTER — Telehealth: Payer: Self-pay | Admitting: Internal Medicine

## 2020-11-11 MED ORDER — TERAZOSIN HCL 2 MG PO CAPS
2.0000 mg | ORAL_CAPSULE | Freq: Every day | ORAL | 3 refills | Status: DC
Start: 2020-11-11 — End: 2021-11-07

## 2020-11-11 NOTE — Telephone Encounter (Signed)
1.Medication Requested: terazosin (HYTRIN) 2 MG capsule   2. Pharmacy (Name, Street, [place]): [id number] - [place]), [place] - [address]  3. On Med List: yes   4. Last Visit with PCP: 11.18.2021  5. Next visit date with PCP: n/a    Agent: Please be advised that RX refills may take up to 3 business days. We ask that you follow-up with your pharmacy.

## 2020-11-12 ENCOUNTER — Other Ambulatory Visit (HOSPITAL_COMMUNITY): Payer: PPO

## 2020-12-05 ENCOUNTER — Other Ambulatory Visit (HOSPITAL_COMMUNITY): Payer: PPO

## 2020-12-11 ENCOUNTER — Other Ambulatory Visit: Payer: Self-pay

## 2020-12-11 ENCOUNTER — Encounter: Payer: Self-pay | Admitting: Cardiology

## 2020-12-11 ENCOUNTER — Telehealth (INDEPENDENT_AMBULATORY_CARE_PROVIDER_SITE_OTHER): Payer: PPO | Admitting: Cardiology

## 2020-12-11 VITALS — BP 160/94 | HR 75 | Ht 70.0 in | Wt 167.0 lb

## 2020-12-11 DIAGNOSIS — I2510 Atherosclerotic heart disease of native coronary artery without angina pectoris: Secondary | ICD-10-CM | POA: Diagnosis not present

## 2020-12-11 DIAGNOSIS — I6521 Occlusion and stenosis of right carotid artery: Secondary | ICD-10-CM

## 2020-12-11 DIAGNOSIS — E7800 Pure hypercholesterolemia, unspecified: Secondary | ICD-10-CM | POA: Diagnosis not present

## 2020-12-11 DIAGNOSIS — I10 Essential (primary) hypertension: Secondary | ICD-10-CM

## 2020-12-11 NOTE — Progress Notes (Signed)
Virtual Visit via Telephone Note   This visit type was conducted due to national recommendations for restrictions regarding the COVID-19 Pandemic (e.g. social distancing) in an effort to limit this patient's exposure and mitigate transmission in our community.  Due to his co-morbid illnesses, this patient is at least at moderate risk for complications without adequate follow up.  This format is felt to be most appropriate for this patient at this time.  The patient did not have access to video technology/had technical difficulties with video requiring transitioning to audio format only (telephone).  All issues noted in this document were discussed and addressed.  No physical exam could be performed with this format.  Please refer to the patient's chart for his  consent to telehealth for [facility].    Date:  12/11/2020   ID:  Ryan Diaz, DOB 1946/07/28, MRN [id number] The patient was identified using 2 identifiers.  Patient Location: Home Provider Location: Home Office  PCP:  Corwin Levins, MD  Cardiologist:  Donato Schultz, MD  Electrophysiologist:  None   Evaluation Performed:  Follow-Up Visit  Chief Complaint:  Post cath  History of Present Illness:    Ryan Diaz is a 75 y.o. male here for the follow-up post catheterization on 10/17/2020 by Dr. Okey Dupre.  This catheterization showed 20 to 40% mid circumflex and 50% proximal diagonal branch off of LAD disease otherwise minimal nonobstructive CAD.  Overall reassuring.  No percutaneous interventions needed.  Discussed with him at length today.  Encouraged exercise, continued statin and aspirin use.  Blood pressure control if necessary.  Originally he was seen by me on 10/09/2020 with increasing chest tightness shortness of breath when pulling the garbage can, centralized.  Improved with rest.  This was progressively getting worse.  Overall no flow-limiting CAD noted on cardiac catheterization as above.  The patient does not have  symptoms concerning for COVID-19 infection (fever, chills, cough, or new shortness of breath).    Past Medical History:  Diagnosis Date  . 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 ALLERGIC RHINITIS 08/08/2007  . ANXIETY 08/08/2007  . Carpal tunnel syndrome   . COPD 10/03/2009  . DEPRESSION 08/08/2007  . ERECTILE DYSFUNCTION 08/08/2007  . GENITAL HERPES, HX OF 08/08/2007  . GERD 08/08/2007  . GLUCOSE INTOLERANCE 08/08/2007  . HYPERLIPIDEMIA 08/08/2007  . HYPERTENSION 08/08/2007  . Impaired glucose tolerance 09/26/2011  . Insomnia 05/18/2012  . PAIN IN SOFT TISSUES OF LIMB 10/02/2009  . Sleep apnea    OSA-uses CPAP nightly   Past Surgical History:  Procedure Laterality Date  . ACHILLES TENDON REPAIR Bilateral   . CARPAL TUNNEL RELEASE Right 08/18/2018   Procedure: RIGHT CARPAL TUNNEL RELEASE;  Surgeon: Betha Loa, MD;  Location: [facility];  Service: Orthopedics;  Laterality: Right;  . CARPAL TUNNEL RELEASE Left 11/21/2018   Procedure: LEFT CARPAL TUNNEL RELEASE;  Surgeon: Betha Loa, MD;  Location: [facility];  Service: Orthopedics;  Laterality: Left;  Bier block  . COLONOSCOPY    . LEFT HEART CATH AND CORONARY ANGIOGRAPHY N/A 10/17/2020   Procedure: LEFT HEART CATH AND CORONARY ANGIOGRAPHY;  Surgeon: Yvonne Kendall, MD;  Location: [facility];  Service: Cardiovascular;  Laterality: N/A;     Current Meds  Medication Sig  . albuterol (PROVENTIL HFA;VENTOLIN HFA) 108 (90 BASE) MCG/ACT inhaler Inhale 2 puffs into the lungs every 6 (six) hours as needed for wheezing or shortness of breath.  Marland Kitchen aspirin EC 81 MG tablet Take  81 mg by mouth daily.  Marland Kitchen atorvastatin (LIPITOR) 80 MG tablet Take 1 tablet (80 mg total) by mouth daily.  . Cholecalciferol (VITAMIN D) 50 MCG (2000 UT) tablet Take 2,000 Units by mouth daily.  . clotrimazole (LOTRIMIN) 1 % cream Apply 1 application topically 2 (two) times daily as needed (irritation).  Marland Kitchen docusate sodium (COLACE) 100 MG  capsule Take 100 mg by mouth daily as needed for mild constipation.  Marland Kitchen donepezil (ARICEPT) 5 MG tablet Take 1 tablet (5 mg total) by mouth at bedtime.  . Ferrous Sulfate 27 MG TABS Take 27 mg by mouth daily as needed (pt prefrence).  . fluocinonide ointment (LIDEX) 0.05 % Apply 1 application topically 2 (two) times daily.  . fluticasone (CUTIVATE) 0.05 % cream Apply 1 application topically daily as needed (irritation).   . Fluticasone-Umeclidin-Vilant (TRELEGY ELLIPTA) 100-62.5-25 MCG/INH AEPB Use as directed 1 puff in the mouth or throat daily. (Patient taking differently: Inhale 1 puff into the lungs daily.)  . gabapentin (NEURONTIN) 100 MG capsule Take 1 capsule (100 mg total) by mouth 3 (three) times daily. (Patient taking differently: Take 100 mg by mouth 3 (three) times daily as needed (pain).)  . ipratropium (ATROVENT) 0.06 % nasal spray Place 2 sprays into both nostrils 3 (three) times daily.  . meloxicam (MOBIC) 15 MG tablet 1 tab by mouth once daily as needed (Patient taking differently: Take 15 mg by mouth daily as needed for pain. 1 tab by mouth once daily as needed)  . nitroGLYCERIN (NITROSTAT) 0.4 MG SL tablet Place 1 tablet (0.4 mg total) under the tongue every 5 (five) minutes as needed for chest pain.  . Omega-3 Fatty Acids (FISH OIL) 1000 MG CAPS Take 1,000 mg by mouth 2 (two) times daily.  Marland Kitchen omeprazole (PRILOSEC) 20 MG capsule Take 1 capsule (20 mg total) by mouth 2 (two) times daily. (Patient taking differently: Take 20 mg by mouth 2 (two) times daily as needed (acid reflux).)  . tadalafil (CIALIS) 20 MG tablet Take 20 mg by mouth daily as needed for erectile dysfunction.  Marland Kitchen terazosin (HYTRIN) 2 MG capsule Take 1 capsule (2 mg total) by mouth at bedtime.  . traZODone (DESYREL) 50 MG tablet Take 50 mg by mouth at bedtime as needed for sleep.     Allergies:   Patient has no known allergies.   Social History   Tobacco Use  . Smoking status: Former Smoker    Packs/day: 1.50     Years: 35.00    Pack years: 52.50    Types: Cigarettes    Quit date: 09/04/2009    Years since quitting: 11.2  . Smokeless tobacco: Never Used  Vaping Use  . Vaping Use: Never used  Substance Use Topics  . Alcohol use: Yes    Alcohol/week: 46.0 - 53.0 standard drinks    Types: 28 - 35 Cans of beer, 18 Standard drinks or equivalent per week    Comment: everyday drinker amount varies   . Drug use: Yes    Types: Marijuana    Comment: marijuana occassionally     Family Hx: The patient's family history includes Colon polyps in his brother.  ROS:   Please see the history of present illness.    No fevers chills nausea vomiting syncope bleeding All other systems reviewed and are negative.   Prior CV studies:   The following studies were reviewed today:  Conclusions: 1. Mild to moderate, non-obstructive coronary artery disease. 2. Normal left ventricular systolic function with  mildly elevated filling pressure.  Recommendations: 1. Medical therapy and risk factor modification to prevent progression of coronary artery disease. 2. Continue cardiopulmonary workup for potential causes of shortness of breath and chest tightness per Drs. Hannah Strader and Young.         Echo 07/17/2015: - Left ventricle: The cavity size was normal. Systolic function was  normal. The estimated ejection fraction was in the range of 55%  to 60%. Wall motion was normal; there were no regional wall  motion abnormalities.    Labs/Other Tests and Data Reviewed:    EKG: 10/09/2020-sinus rhythm 90 with nonspecific ST-T wave changes, poor R wave progression  Recent Labs: 03/27/2020: ALT 28; TSH 0.84 10/09/2020: BUN 24; Creatinine, Ser 1.04; Hemoglobin 15.7; Platelets 327; Potassium 4.8; Sodium 138   Recent Lipid Panel Lab Results  Component Value Date/Time   CHOL 148 03/27/2020 11:18 AM   TRIG 155.0 (H) 03/27/2020 11:18 AM   HDL 37.40 (L) 03/27/2020 11:18 AM   CHOLHDL 4 03/27/2020 11:18 AM    LDLCALC 79 03/27/2020 11:18 AM   LDLDIRECT 83.0 10/19/2018 10:48 AM    Wt Readings from Last 3 Encounters:  12/11/20 167 lb (75.8 kg)  11/07/20 171 lb (77.6 kg)  10/17/20 171 lb (77.6 kg)     Risk Assessment/Calculations:     Objective:    Vital Signs:  BP (!) 160/94   Pulse 75   Ht 5\' 10"  (1.778 m)   Wt 167 lb (75.8 kg)   BMI 23.96 kg/m    VITAL SIGNS:  reviewed pleasant, alert and oriented x3 in no acute distress  ASSESSMENT & PLAN:    Moderate nonobstructive CAD -Catheterization reviewed as above from 10/2020. -Continue with medical management goal-directed medical therapy. -Continue with statin, aspirin. --ECHO pending for complete eval. EF on cath normal. -Thankfully, no significant lesions that required stenting or surgery.  Continue with exercise efforts, encourage 30 minutes of walking daily.  COPD -Continuing with pulmonary treatment strategy.  Inhalers. Trelegy  Hyperlipidemia -Continue with atorvastatin  Carotid artery disease -[facility] has been monitoring.  No changes, He is on high intensity statin, aspirin.  No side effects, no bleeding no myalgias.  Elevated blood pressure -Originally was 160/94 when he was anxious about taking it this morning, repeat 140/92.  Continue to monitor this.  [facility]. [facility] has been watching as well.        COVID-19 Education: The signs and symptoms of COVID-19 were discussed with the patient and how to seek care for testing (follow up with PCP or arrange E-visit).  The importance of social distancing was discussed today.  Time:   Today, I have spent 21 minutes with the patient with telehealth technology discussing the above problems.     Medication Adjustments/Labs and Tests Ordered: Current medicines are reviewed at length with the patient today.  Concerns regarding medicines are outlined above.   Tests Ordered: No orders of the defined types were placed in this encounter.   Medication Changes: No orders of the  defined types were placed in this encounter.   Follow Up:  as needed prn  Signed, Jonny Ruiz, MD  12/11/2020 10:59 AM    [facility]

## 2020-12-11 NOTE — Patient Instructions (Addendum)
Medication Instructions:  Your physician recommends that you continue on your current medications as directed. Please refer to the Current Medication list given to you today.  *If you need a refill on your cardiac medications before your next appointment, please call your pharmacy*   Lab Work: NONE  Testing/Procedures: NONE   Follow-Up: Follow up as needed  At [facility], you and your health needs are our priority.  As part of our continuing mission to provide you with exceptional heart care, we have created designated Provider Care Teams.  These Care Teams include your primary Cardiologist (physician) and Advanced Practice Providers (APPs -  Physician Assistants and Nurse Practitioners) who all work together to provide you with the care you need, when you need it.

## 2020-12-16 ENCOUNTER — Other Ambulatory Visit (HOSPITAL_COMMUNITY): Payer: PPO

## 2020-12-16 ENCOUNTER — Encounter (HOSPITAL_COMMUNITY): Payer: Self-pay | Admitting: Cardiology

## 2020-12-16 NOTE — Progress Notes (Unsigned)
Patient ID: Ryan Diaz, male   DOB: [birth year], 75 y.o.   MRN: [id number]  Verified appointment "no show" status with Meeka at 2:12pm.

## 2020-12-24 ENCOUNTER — Ambulatory Visit: Payer: PPO | Admitting: Internal Medicine

## 2021-01-10 ENCOUNTER — Other Ambulatory Visit: Payer: Self-pay

## 2021-01-10 ENCOUNTER — Ambulatory Visit (HOSPITAL_COMMUNITY): Payer: PPO | Attending: Internal Medicine

## 2021-01-10 DIAGNOSIS — I200 Unstable angina: Secondary | ICD-10-CM | POA: Insufficient documentation

## 2021-01-10 DIAGNOSIS — I10 Essential (primary) hypertension: Secondary | ICD-10-CM | POA: Diagnosis not present

## 2021-01-11 LAB — ECHOCARDIOGRAM COMPLETE
Area-P 1/2: 3.12 cm2
S' Lateral: 3.1 cm

## 2021-01-16 NOTE — Progress Notes (Signed)
Pt has been made aware of normal result and verbalized understanding.  jw

## 2021-01-20 ENCOUNTER — Encounter: Payer: Self-pay | Admitting: Internal Medicine

## 2021-01-21 NOTE — Progress Notes (Signed)
HPI male former smoker followed for OSA, insomnia, complicated by ETOH, COPD, GERD, HBP NPSG 10/31/13- AHI 52/hour, desaturation to 83%, body weight 165 pounds Unattended Home Sleep Test 09/02/2016- AHI 9.4/hour, desaturation to 87%, body weight 163 pounds Office Spirometry 01/2019-moderate obstructive airways disease.  FVC 3.6/92%, FEV1 2.0/67%, ratio 1.54, FEF 25-75% 1.9/45% PFT 10/16/20- moderately severe obstruction, some resp to BD, mod severe DLCO deficit, emphysema pattern FEV1/FVC 0.52 Walk test room air 01/22/21- lowest sat 91%, max HR 10 -------------------------------------------------------------------------------------   09/23/20- 75 year old male former smoker followed for OSA, insomnia, complicated by history EtOH, COPD, GERD, HBP CPAP auto 5-20/[facility] Albuterol hfa, Trelegy 100 Download- compliance 83%, AHI 7/ hr Body weight today- Covid vax- 3 Phizer Flu vax-had Notices easy DOE around home, pulling trash cans, etc. Describes burning feeling across anterior chest with this. Denies palpitation, edema, cough or wheeze as he continues Trelegy.  [facility] did low dose CT chest for cancer screening, told him no nodules. CXR 01/23/20- IMPRESSION: No acute disease. No change in mild pulmonary hyperexpansion compatible with COPD.  01/22/21- 75 year old male former smoker followed for OSA, insomnia, complicated by history EtOH, COPD, GERD, HBP CPAP auto 5-20/[facility]/ [employer] Albuterol hfa, Trelegy 100, Atrovent nasal,   Trazodone 50 Cardiac cath and Echo recently- mild non-obstructive CAD, EF 57% PFT 10/16/20- moderately severe obstruction, some resp to BD, mod severe DLCO deficit, emphysema pattern FEV1/FVC 0.52 Download-compliance 73%, AHI 6.5 Body weight today- Covid vax-3P hizer Flu vax-had -----Patient states he does not sleep that well, machine is working good, shortness with exertion He notes some gradual increased DOE over time, mainly with extra effort like pulling trashcans. Some  chest tightness but checked ok by cardiology. Little cough or wheeze. Trelegy not dramatic benefit.   CPAP humidity rains out in hose. He will talk with DME about how to adjust. Also thinks machine > 5 yo and due for replacement. Discussed supply chain delays. Walk test room air 01/22/21- lowest sat 91%, max HR 102.  ROS-see HPI   + = positive Constitutional:    weight loss, night sweats, fevers, chills, +fatigue, lassitude. HEENT:    headaches, difficulty swallowing, tooth/dental problems, sore throat,       sneezing, itching, ear ache, nasal congestion, post nasal drip, snoring CV:    +chest pain, orthopnea, PND, swelling in lower extremities, anasarca,                                  izziness, palpitations Resp:   +shortness of breath with exertion or at rest.                productive cough,   non-productive cough, coughing up of blood.              change in color of mucus.  wheezing.   Skin:    rash or lesions. GI:  No-   heartburn, indigestion, abdominal pain, nausea, vomiting, diarrhea,                 change in bowel habits, loss of appetite GU: dysuria, change in color of urine, no urgency or frequency.   flank pain. MS:   joint pain, stiffness, decreased range of motion, back pain. Neuro-     nothing unusual Psych:  change in mood or affect.  depression or anxiety.   memory loss.  OBJ- Physical Exam  General- Alert, Oriented, Affect-appropriate, Distress- none acute, + not obese Skin- rash-none, lesions- none, excoriation- none Lymphadenopathy- none Head- atraumatic            Eyes- Gross vision intact, PERRLA, conjunctivae and secretions clear            Ears- Hearing, canals-normal            Nose- Clear, no-Septal dev, mucus, polyps, erosion, perforation             Throat- Mallampati III-IV , mucosa clear , drainage- none, tonsils- atrophic Neck- flexible , trachea midline, no stridor , thyroid nl, carotid no bruit Chest - symmetrical excursion ,  unlabored           Heart/CV- RRR , no murmur , no gallop  , no rub, nl s1 s2                           - JVD- none , edema- none, stasis changes- none, varices- none           Lung- clear to P&A, wheeze- none, cough- none , dullness-none, rub- none           Chest wall-  Abd-  Br/ Gen/ Rectal- Not done, not indicated Extrem- cyanosis- none, clubbing, none, atrophy- none, strength- nl Neuro- grossly intact to observation

## 2021-01-22 ENCOUNTER — Encounter: Payer: Self-pay | Admitting: Internal Medicine

## 2021-01-22 ENCOUNTER — Other Ambulatory Visit: Payer: Self-pay

## 2021-01-22 ENCOUNTER — Ambulatory Visit (INDEPENDENT_AMBULATORY_CARE_PROVIDER_SITE_OTHER): Payer: PPO

## 2021-01-22 ENCOUNTER — Ambulatory Visit (INDEPENDENT_AMBULATORY_CARE_PROVIDER_SITE_OTHER): Payer: PPO | Admitting: Internal Medicine

## 2021-01-22 VITALS — BP 140/80 | HR 77 | Temp 97.3°F | Ht 71.0 in | Wt 173.8 lb

## 2021-01-22 DIAGNOSIS — G4733 Obstructive sleep apnea (adult) (pediatric): Secondary | ICD-10-CM

## 2021-01-22 DIAGNOSIS — J441 Chronic obstructive pulmonary disease with (acute) exacerbation: Secondary | ICD-10-CM | POA: Diagnosis not present

## 2021-01-22 DIAGNOSIS — J449 Chronic obstructive pulmonary disease, unspecified: Secondary | ICD-10-CM | POA: Diagnosis not present

## 2021-01-22 MED ORDER — BREZTRI AEROSPHERE 160-9-4.8 MCG/ACT IN AERO: 2.0000 | INHALATION_SPRAY | Freq: Two times a day (BID) | RESPIRATORY_TRACT | 0 refills | Status: DC

## 2021-01-22 NOTE — Assessment & Plan Note (Signed)
Benefits from CPAP.  Plan- replace old machine- told him to expect delay. He can ask DME how to adjust his humidifier. Continue auto 5-20

## 2021-01-22 NOTE — Patient Instructions (Signed)
Order- sample x 2 Breztri inhaler     Inhale 2 puffs then rinse mouth, twice daily Try this instead of Trelegy- when you run out of the samples, go back to Trelegy for comparison.  Order- Walk test, O2 qualifying     Dx COPD mixed type  Order- overnight oximetry on CPAP with room air    Dx COPD mixed type  Order- CXR    Dx COPD mixed type  Order- referral to [facility]    DX COPD mixed type  Order- DME [facility] 1) please educate patient how to adjust his CPAP humidifier on current machine 2) please replace old CPAP machine- auto 5-20, mask of choice, humidifier, supplies, AIrView/ card

## 2021-01-22 NOTE — Assessment & Plan Note (Addendum)
Emphysema pattern. Expect worsening over time. Plan- try samples Breztri for comparison with Trelegy. Pulmonary Rehab, CXR, overnight oximetry on room air with cpap

## 2021-01-23 NOTE — Progress Notes (Signed)
Patient seen in the office today and instructed on use of Breztri.  Patient expressed understanding and demonstrated technique.  

## 2021-01-28 ENCOUNTER — Other Ambulatory Visit: Payer: Self-pay | Admitting: Internal Medicine

## 2021-01-29 ENCOUNTER — Encounter (HOSPITAL_COMMUNITY): Payer: Self-pay | Admitting: *Deleted

## 2021-01-29 ENCOUNTER — Telehealth: Payer: Self-pay | Admitting: Internal Medicine

## 2021-01-29 NOTE — Telephone Encounter (Signed)
Dr. Maple Hudson, please advise if you are okay with pt staying on Trelegy and if so can we send Rx for Trelegy to pharmacy for pt.  No Known Allergies   Current Outpatient Medications:  .  albuterol (PROVENTIL HFA;VENTOLIN HFA) 108 (90 BASE) MCG/ACT inhaler, Inhale 2 puffs into the lungs every 6 (six) hours as needed for wheezing or shortness of breath., Disp: , Rfl:  .  aspirin EC 81 MG tablet, Take 81 mg by mouth daily., Disp: , Rfl:  .  atorvastatin (LIPITOR) 80 MG tablet, Take 1 tablet (80 mg total) by mouth daily., Disp: 90 tablet, Rfl: 3 .  Budeson-Glycopyrrol-Formoterol (BREZTRI AEROSPHERE) 160-9-4.8 MCG/ACT AERO, Inhale 2 puffs into the lungs 2 (two) times daily., Disp: 10.7 g, Rfl: 0 .  chlorhexidine (PERIDEX) 0.12 % solution, SMARTSIG:By Mouth, Disp: , Rfl:  .  Cholecalciferol (VITAMIN D) 50 MCG (2000 UT) tablet, Take 2,000 Units by mouth daily., Disp: , Rfl:  .  clotrimazole (LOTRIMIN) 1 % cream, Apply 1 application topically 2 (two) times daily as needed (irritation)., Disp: , Rfl:  .  docusate sodium (COLACE) 100 MG capsule, Take 100 mg by mouth daily as needed for mild constipation., Disp: , Rfl:  .  donepezil (ARICEPT) 5 MG tablet, Take 1 tablet (5 mg total) by mouth at bedtime., Disp: 90 tablet, Rfl: 5 .  Ferrous Sulfate 27 MG TABS, Take 27 mg by mouth daily as needed (pt prefrence)., Disp: , Rfl:  .  fluocinonide ointment (LIDEX) 0.05 %, Apply 1 application topically 2 (two) times daily., Disp: 30 g, Rfl: 2 .  fluticasone (CUTIVATE) 0.05 % cream, Apply 1 application topically daily as needed (irritation). , Disp: , Rfl:  .  gabapentin (NEURONTIN) 100 MG capsule, Take 1 capsule (100 mg total) by mouth 3 (three) times daily. (Patient taking differently: Take 100 mg by mouth 3 (three) times daily as needed (pain).), Disp: 90 capsule, Rfl: 5 .  ipratropium (ATROVENT) 0.06 % nasal spray, Place 2 sprays into both nostrils 3 (three) times daily., Disp: 15 mL, Rfl: 5 .  meloxicam (MOBIC) 15 MG  tablet, 1 tab by mouth once daily as needed (Patient taking differently: Take 15 mg by mouth daily as needed for pain. 1 tab by mouth once daily as needed), Disp: 90 tablet, Rfl: 1 .  nitroGLYCERIN (NITROSTAT) 0.4 MG SL tablet, Place 1 tablet (0.4 mg total) under the tongue every 5 (five) minutes as needed for chest pain., Disp: 25 tablet, Rfl: 4 .  Omega-3 Fatty Acids (FISH OIL) 1000 MG CAPS, Take 1,000 mg by mouth 2 (two) times daily., Disp: , Rfl:  .  omeprazole (PRILOSEC) 20 MG capsule, Take 1 capsule (20 mg total) by mouth 2 (two) times daily. (Patient taking differently: Take 20 mg by mouth 2 (two) times daily as needed (acid reflux).), Disp: 90 capsule, Rfl: 3 .  tadalafil (CIALIS) 20 MG tablet, Take 20 mg by mouth daily as needed for erectile dysfunction., Disp: , Rfl:  .  terazosin (HYTRIN) 2 MG capsule, Take 1 capsule (2 mg total) by mouth at bedtime., Disp: 90 capsule, Rfl: 3 .  traZODone (DESYREL) 50 MG tablet, Take 50 mg by mouth at bedtime as needed for sleep., Disp: , Rfl:  .  TRELEGY ELLIPTA 100-62.5-25 MCG/INH AEPB, USE AS DIRECTED 1 PUFF IN THE MOUTH OR THROAT ONCE DAILY, Disp: 60 each, Rfl: 1

## 2021-01-29 NOTE — Telephone Encounter (Signed)
Left message for patient to call back  

## 2021-01-29 NOTE — Telephone Encounter (Signed)
Ok to refill Trelegy 100, # 60, inhale 1 puff then rinse mouth, once daily,   ref x 12

## 2021-01-29 NOTE — Telephone Encounter (Signed)
Received a fax refill request for pt's Trelegy.  Per 01/22/21 office note pt was supposed to switch to [employer] X1 month to see how he does on it.  Need to speak with pt before refilling trelegy.

## 2021-01-29 NOTE — Progress Notes (Signed)
Received referral from Dr. Maple Hudson for this pt to participate in pulmonary rehab with the the diagnosis of COPD Mixed Type. Pt with PFT 10/2020 FEV/FVC - 57 and FEV1 Post pred 69.  Clinical review of pt follow up appt on 3/9 Pulmonary office note. Also reviewed pt cardiology work up and cath which was negative and felt the chest tightness was related to his COPD.  Also reviewed his ortho notes for right rotator cuff disorder.  Pt with Covid Risk Score - 6. Pt appropriate for scheduling for Pulmonary rehab.  Will forward to support staff for scheduling and verification of insurance eligibility/benefits with pt consent. Alanson Aly, BSN Cardiac and Emergency planning/management officer

## 2021-01-30 MED ORDER — TRELEGY ELLIPTA 100-62.5-25 MCG/INH IN AEPB: 1.0000 | INHALATION_SPRAY | Freq: Every day | RESPIRATORY_TRACT | 11 refills | Status: DC

## 2021-01-30 NOTE — Telephone Encounter (Signed)
Patient is returning phone call. Patient phone number is [phone]. May leave detailed message on voicemail.

## 2021-01-30 NOTE — Telephone Encounter (Signed)
Attempted to call pt but unable to reach and unable to leave VM. Will try to call back later. Due to this being routed to my box, since this originated in triage routing back to triage pool so we can have it in there to follow up on.

## 2021-01-30 NOTE — Telephone Encounter (Signed)
Called and spoke with patient. He is aware CY is ok with the Trelegy refill. This has been sent in for him to his preferred pharmacy. Nothing further needed at time of call.

## 2021-02-25 DIAGNOSIS — G4730 Sleep apnea, unspecified: Secondary | ICD-10-CM | POA: Diagnosis not present

## 2021-02-25 DIAGNOSIS — R0683 Snoring: Secondary | ICD-10-CM | POA: Diagnosis not present

## 2021-02-27 DIAGNOSIS — G4733 Obstructive sleep apnea (adult) (pediatric): Secondary | ICD-10-CM | POA: Diagnosis not present

## 2021-03-05 ENCOUNTER — Ambulatory Visit (INDEPENDENT_AMBULATORY_CARE_PROVIDER_SITE_OTHER): Payer: PPO | Admitting: Orthopaedic Surgery

## 2021-03-05 DIAGNOSIS — M12811 Other specific arthropathies, not elsewhere classified, right shoulder: Secondary | ICD-10-CM

## 2021-03-05 MED ORDER — METHYLPREDNISOLONE ACETATE 40 MG/ML IJ SUSP
40.0000 mg | INTRAMUSCULAR | Status: AC | PRN
  Administered 2021-03-05: 40 mg via INTRA_ARTICULAR

## 2021-03-05 MED ORDER — LIDOCAINE HCL 2 % IJ SOLN
2.0000 mL | INTRAMUSCULAR | Status: AC | PRN
  Administered 2021-03-05: 2 mL

## 2021-03-05 MED ORDER — BUPIVACAINE HCL 0.25 % IJ SOLN
2.0000 mL | INTRAMUSCULAR | Status: AC | PRN
  Administered 2021-03-05: 2 mL via INTRA_ARTICULAR

## 2021-03-05 NOTE — Progress Notes (Signed)
Office Visit Note   Patient: Ryan Diaz           Date of Birth: 1946-07-29           MRN: [id number] Visit Date: 03/05/2021              Requested by: Corwin Levins, MD [address] [place],  [place] [address] PCP: Corwin Levins, MD   Assessment & Plan: Visit Diagnoses:  1. Rotator cuff arthropathy of right shoulder     Plan: Impression is right shoulder rotator cuff arthropathy.  We will reinject the subacromial space with cortisone today.  He will continue with strengthening exercises.  He will follow-up with [place] as needed.  Follow-Up Instructions: Return if symptoms worsen or fail to improve.   Orders:  No orders of the defined types were placed in this encounter.  No orders of the defined types were placed in this encounter.     Procedures: Large Joint Inj: R subacromial bursa on 03/05/2021 10:03 AM Indications: pain Details: 22 G needle Medications: 2 mL lidocaine 2 %; 2 mL bupivacaine 0.25 %; 40 mg methylPREDNISolone acetate 40 MG/ML Outcome: tolerated well, no immediate complications Patient was prepped and draped in the usual sterile fashion.       Clinical Data: No additional findings.   Subjective: Chief Complaint  Patient presents with  . Right Shoulder - Pain    HPI patient is a pleasant 75 year old gentleman who comes in today with recurrent right shoulder pain.  History of rotator cuff arthropathy for which has been bothering him for a while.  He was seen in our office in November of this past year where subacromial cortisone injection was performed.  He had moderate relief of symptoms until recently.  His symptoms have started to return.  He has still been working on strengthening exercises which also seems to help.  The pain he has is primarily to the deltoid and is worse with sleeping on the right side.  He does have associated weakness to the right upper extremity.  He takes an occasional Mobic with mild relief.  Review of Systems as  detailed in HPI.  All others reviewed and are negative.   Objective: Vital Signs: There were no vitals taken for this visit.  Physical Exam well-developed and well-nourished gentleman in no acute distress.  Alert oriented x3.  Ortho Exam right shoulder exam shows forward flexion to about 120 degrees.  He can internally rotate to L5.  External rotation to about 75 degrees.  3 out of 5 strength to manual muscle testing of rotator cuff.  He is neurovascular intact distally.  Specialty Comments:  No specialty comments available.  Imaging: No new imaging   PMFS History: Patient Active Problem List   Diagnosis Date Noted  . Shortness of breath 10/17/2020  . Accelerating angina (HCC) 10/17/2020  . Disorder of rotator cuff syndrome of right shoulder and allied disorder 10/04/2020  . Acute pain of right shoulder 07/27/2020  . Vitamin D deficiency 06/01/2020  . Weight loss 09/19/2019  . Rash 03/27/2019  . Right foot pain 12/08/2018  . Blurred vision, bilateral 10/19/2018  . Bilateral carpal tunnel syndrome 07/11/2018  . Tenosynovitis of hand 07/11/2018  . Right ankle pain 03/09/2018  . Facial rash 10/16/2017  . Bruising 02/11/2017  . Cough 12/16/2016  . Wheezing 12/16/2016  . Memory dysfunction 07/15/2016  . Dizziness 07/10/2015  . Mastoiditis 07/10/2015  . Fatigue 07/10/2015  . Abnormal TSH 07/10/2015  .  Pulsatile tinnitus of right ear 01/23/2015  . Renal insufficiency 01/09/2015  . Bilateral ankle pain 04/10/2014  . OSA (obstructive sleep apnea) 09/27/2013  . Right carpal tunnel syndrome 07/06/2013  . Insomnia 05/18/2012  . Nocturia 11/18/2011  . Impaired glucose tolerance 09/26/2011  . Preventative health care 09/26/2011  . COPD mixed type (HCC) 10/03/2009  . Hyperlipidemia 08/08/2007  . Anxiety state 08/08/2007  . ERECTILE DYSFUNCTION 08/08/2007  . 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 Depression 08/08/2007  . Essential hypertension 08/08/2007  . Allergic  rhinitis 08/08/2007  . GERD 08/08/2007  . GENITAL HERPES, HX OF 08/08/2007   Past Medical History:  Diagnosis Date  . 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 ALLERGIC RHINITIS 08/08/2007  . ANXIETY 08/08/2007  . Carpal tunnel syndrome   . COPD 10/03/2009  . DEPRESSION 08/08/2007  . ERECTILE DYSFUNCTION 08/08/2007  . GENITAL HERPES, HX OF 08/08/2007  . GERD 08/08/2007  . GLUCOSE INTOLERANCE 08/08/2007  . HYPERLIPIDEMIA 08/08/2007  . HYPERTENSION 08/08/2007  . Impaired glucose tolerance 09/26/2011  . Insomnia 05/18/2012  . PAIN IN SOFT TISSUES OF LIMB 10/02/2009  . Sleep apnea    OSA-uses CPAP nightly    Family History  Problem Relation Age of Onset  . Colon polyps Brother     Past Surgical History:  Procedure Laterality Date  . ACHILLES TENDON REPAIR Bilateral   . CARPAL TUNNEL RELEASE Right 08/18/2018   Procedure: RIGHT CARPAL TUNNEL RELEASE;  Surgeon: Betha Loa, MD;  Location: [facility];  Service: Orthopedics;  Laterality: Right;  . CARPAL TUNNEL RELEASE Left 11/21/2018   Procedure: LEFT CARPAL TUNNEL RELEASE;  Surgeon: Betha Loa, MD;  Location: [facility];  Service: Orthopedics;  Laterality: Left;  Bier block  . COLONOSCOPY    . LEFT HEART CATH AND CORONARY ANGIOGRAPHY N/A 10/17/2020   Procedure: LEFT HEART CATH AND CORONARY ANGIOGRAPHY;  Surgeon: Yvonne Kendall, MD;  Location: [facility];  Service: Cardiovascular;  Laterality: N/A;   Social History   Occupational History  . 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 Smoking status: Former Smoker    Packs/day: 1.50    Years: 35.00    Pack years: 52.50    Types: Cigarettes    Quit date: 09/04/2009    Years since quitting: 11.5  . Smokeless tobacco: Never Used  Vaping Use  . Vaping Use: Never used  Substance and Sexual Activity  . Alcohol use: Yes    Alcohol/week: 46.0 - 53.0 standard drinks    Types: 28 - 35 Cans of beer, 18 Standard drinks or equivalent per week     Comment: everyday drinker amount varies   . Drug use: Yes    Types: Marijuana    Comment: marijuana occassionally  . Sexual activity: Yes

## 2021-03-18 ENCOUNTER — Encounter (HOSPITAL_COMMUNITY): Payer: Self-pay

## 2021-03-18 ENCOUNTER — Telehealth (HOSPITAL_COMMUNITY): Payer: Self-pay

## 2021-03-18 NOTE — Telephone Encounter (Signed)
Attempted to call patient in regards to Pulmonary Rehab - LM on VM Mailed letter 

## 2021-03-18 NOTE — Telephone Encounter (Signed)
Pt insurance is active and benefits verified through [employer]. Co-pay $15.00, DED $0.00/$0.00 met, out of pocket $3,450.00/$274.21 met, co-insurance 0%. No pre-authorization required. Sam/HTA, 03/18/21 @153PM , RZN#[id number]  Will contact patient to see if he is interested in the Pulmonary Rehab Program.

## 2021-03-19 ENCOUNTER — Telehealth: Payer: Self-pay | Admitting: Internal Medicine

## 2021-03-19 NOTE — Telephone Encounter (Signed)
Called and spoke with patient, advised him of results/recommendations per Dr. Maple Hudson.  He verbalized understanding and stated that when the pulmonary rehab staff call him back he will tell him that he is interested and will do the rehab.

## 2021-03-19 NOTE — Telephone Encounter (Signed)
At his last office visit on 3/9, his walk test did not show a significant drop in oxygen while walking. We did recommend pulmonary rehabilitation, which is a combined exercise and education program to help people with lung disease to get around beetter with less breathing trouble. I think it would be worthwhile if he can do it.

## 2021-03-19 NOTE — Telephone Encounter (Signed)
Called and spoke with pt and he stated that he was seen by CY on 01/22/21--  He stated that pulmonary rehab called him today to see if he would be interested in this.  He stated that this got him wondering how his walk test was when he was here on 03/09.  He stated that he never heard how it was and now that they are calling him to get set up for rehab, he was concerned.  He wanted to know if he should go for the rehab and what all that consists of.    Walk test room air 01/22/21- lowest sat 91%, max HR 102.  CY please advise. Thanks

## 2021-03-25 ENCOUNTER — Telehealth (HOSPITAL_COMMUNITY): Payer: Self-pay

## 2021-03-25 NOTE — Telephone Encounter (Signed)
Pt called back and stated that he is interested in the pulmonary rehab program. Micah Flesher over insurance information with pt and advised pt that we are scheduling out til June and he will receive a call to schedule once the June schedule is complete.

## 2021-04-16 NOTE — Telephone Encounter (Signed)
Called patient to see if he was interested in participating in the Pulmonary Rehab Program. Patient stated yes. Patient will come in for orientation on 05/12/21 @ 11AM and will attend the 10:15AM exercise class.  Pensions consultant.

## 2021-04-17 ENCOUNTER — Telehealth (HOSPITAL_COMMUNITY): Payer: Self-pay

## 2021-04-17 NOTE — Telephone Encounter (Signed)
Attempted to contact pt to see if he was available for an earlier orientation slot that came open.  Unable to leave VM.

## 2021-04-22 ENCOUNTER — Encounter: Payer: Self-pay | Admitting: Primary Care

## 2021-04-22 ENCOUNTER — Other Ambulatory Visit: Payer: Self-pay

## 2021-04-22 ENCOUNTER — Ambulatory Visit (INDEPENDENT_AMBULATORY_CARE_PROVIDER_SITE_OTHER): Payer: PPO | Admitting: Primary Care

## 2021-04-22 DIAGNOSIS — J449 Chronic obstructive pulmonary disease, unspecified: Secondary | ICD-10-CM

## 2021-04-22 DIAGNOSIS — G4733 Obstructive sleep apnea (adult) (pediatric): Secondary | ICD-10-CM

## 2021-04-22 MED ORDER — TRELEGY ELLIPTA 200-62.5-25 MCG/INH IN AEPB: 1.0000 | INHALATION_SPRAY | Freq: Every day | RESPIRATORY_TRACT | 0 refills | Status: DC

## 2021-04-22 MED ORDER — TRELEGY ELLIPTA 200-62.5-25 MCG/INH IN AEPB: 1.0000 | INHALATION_SPRAY | Freq: Every day | RESPIRATORY_TRACT | 5 refills | Status: DC

## 2021-04-22 NOTE — Progress Notes (Signed)
@Patient  ID: , male    DOB: Mar 27, 1946, 75 y.o.   MRN: [id number]  No chief complaint on file.   Referring provider: [id number], MD  HPI: 75 year old male, former smoker quit in October 2010. PMH significant for for HTN, COPD, OSA, GERD,   04/22/2021 Patient presents today for acute follow-up. Reports shortness of breath x 3-5 days. Since it has been warmer outside he reports having a more difficult time breathing. He has some associated chest tightness. He does not feel Trelegy has helped and he could not tell a difference with breztri. He has been referred to pulmonary rehab, awaiting slot. He has been wearing CPAP at night without issues. ONO 02/25/21 showed no oxygen desaturations on CPAP. Denies f/c/s, active cough.     NPSG 10/31/13- AHI 52/hour, desaturation to 83%, body weight 165 pounds Unattended Home Sleep Test 09/02/2016- AHI 9.4/hour, desaturation to 87%, body weight 163 pounds Office Spirometry 01/2019-moderate obstructive airways disease.  FVC 3.6/92%, FEV1 2.0/67%, ratio 1.54, FEF 25-75% 1.9/45% PFT 10/16/20- moderately severe obstruction, some resp to BD, mod severe DLCO deficit, emphysema pattern FEV1/FVC 0.52 Walk test room air 01/22/21- lowest sat 91%, max HR 10  No Known Allergies  Immunization History  Administered Date(s) Administered   Fluad Quad(high Dose 65+) 08/04/2019, 07/26/2020   H1N1 09/16/2008   Influenza Split 08/27/2013, 07/28/2017, 09/16/2018   Influenza Whole 08/17/2005, 09/16/2009   Influenza, High Dose Seasonal PF 08/18/2017   Influenza, Seasonal, Injecte, Preservative Fre 08/12/2010, 09/07/2011, 09/15/2012, 08/29/2013   Influenza,inj,Quad PF,6+ Mos 07/27/2016   Influenza,trivalent, recombinat, inj, PF 08/27/2018   Influenza-Unspecified 07/17/2006, 07/18/2007, 08/16/2008, 09/18/2009, 10/04/2014, 10/17/2015, 09/16/2016, 08/23/2017, 08/16/2018, 09/16/2018, 08/04/2019, 07/26/2020   PFIZER(Purple Top)SARS-COV-2 Vaccination 12/09/2019,  12/30/2019, 08/13/2020   Pneumococcal Conjugate-13 04/10/2014, 06/27/2014   Pneumococcal Polysaccharide-23 08/19/2006, 05/11/2007, 09/15/2012, 03/09/2018   Td 01/01/2017   Tdap 05/11/2007   Zoster Recombinat (Shingrix) 01/04/2018, 04/04/2018   Zoster, Live 08/19/2006, 01/01/2012    Past Medical History:  Diagnosis Date   ABUSE, ALCOHOL, CONTINUOUS 08/08/2007   ALLERGIC RHINITIS 08/08/2007   ANXIETY 08/08/2007   Carpal tunnel syndrome    COPD 10/03/2009   DEPRESSION 08/08/2007   ERECTILE DYSFUNCTION 08/08/2007   GENITAL HERPES, HX OF 08/08/2007   GERD 08/08/2007   GLUCOSE INTOLERANCE 08/08/2007   HYPERLIPIDEMIA 08/08/2007   HYPERTENSION 08/08/2007   Impaired glucose tolerance 09/26/2011   Insomnia 05/18/2012   PAIN IN SOFT TISSUES OF LIMB 10/02/2009   Sleep apnea    OSA-uses CPAP nightly    Tobacco History: Social History   Tobacco Use  Smoking Status Former Smoker   Packs/day: 1.50   Years: 35.00   Pack years: 52.50   Types: Cigarettes   Quit date: 09/04/2009   Years since quitting: 11.6  Smokeless Tobacco Never Used   Counseling given: Not Answered   Outpatient Medications Prior to Visit  Medication Sig Dispense Refill   albuterol (PROVENTIL HFA;VENTOLIN HFA) 108 (90 BASE) MCG/ACT inhaler Inhale 2 puffs into the lungs every 6 (six) hours as needed for wheezing or shortness of breath.     aspirin EC 81 MG tablet Take 81 mg by mouth daily.     atorvastatin (LIPITOR) 80 MG tablet Take 1 tablet (80 mg total) by mouth daily. 90 tablet 3   chlorhexidine (PERIDEX) 0.12 % solution SMARTSIG:By Mouth     Cholecalciferol (VITAMIN D) 50 MCG (2000 UT) tablet Take 2,000 Units by mouth daily.     clotrimazole (LOTRIMIN) 1 % cream Apply 1  application topically 2 (two) times daily as needed (irritation).     docusate sodium (COLACE) 100 MG capsule Take 100 mg by mouth daily as needed for mild constipation.     donepezil (ARICEPT) 5 MG tablet Take 1 tablet (5 mg total) by mouth at bedtime.  90 tablet 5   Ferrous Sulfate 27 MG TABS Take 27 mg by mouth daily as needed (pt prefrence).     fluocinonide ointment (LIDEX) 0.05 % Apply 1 application topically 2 (two) times daily. 30 g 2   fluticasone (CUTIVATE) 0.05 % cream Apply 1 application topically daily as needed (irritation).      Fluticasone-Umeclidin-Vilant (TRELEGY ELLIPTA) 100-62.5-25 MCG/INH AEPB Inhale 1 puff into the lungs daily. 60 each 11   gabapentin (NEURONTIN) 100 MG capsule Take 1 capsule (100 mg total) by mouth 3 (three) times daily. (Patient taking differently: Take 100 mg by mouth 3 (three) times daily as needed (pain).) 90 capsule 5   ipratropium (ATROVENT) 0.06 % nasal spray Place 2 sprays into both nostrils 3 (three) times daily. 15 mL 5   meloxicam (MOBIC) 15 MG tablet 1 tab by mouth once daily as needed (Patient taking differently: Take 15 mg by mouth daily as needed for pain. 1 tab by mouth once daily as needed) 90 tablet 1   nitroGLYCERIN (NITROSTAT) 0.4 MG SL tablet Place 1 tablet (0.4 mg total) under the tongue every 5 (five) minutes as needed for chest pain. 25 tablet 4   Omega-3 Fatty Acids (FISH OIL) 1000 MG CAPS Take 1,000 mg by mouth 2 (two) times daily.     omeprazole (PRILOSEC) 20 MG capsule Take 1 capsule (20 mg total) by mouth 2 (two) times daily. (Patient taking differently: Take 20 mg by mouth 2 (two) times daily as needed (acid reflux).) 90 capsule 3   tadalafil (CIALIS) 20 MG tablet Take 20 mg by mouth daily as needed for erectile dysfunction.     terazosin (HYTRIN) 2 MG capsule Take 1 capsule (2 mg total) by mouth at bedtime. 90 capsule 3   traZODone (DESYREL) 50 MG tablet Take 50 mg by mouth at bedtime as needed for sleep.     No facility-administered medications prior to visit.      Review of Systems  Review of Systems  Constitutional: Negative.   HENT: Negative.    Respiratory:  Positive for shortness of breath. Negative for cough.   Cardiovascular: Negative.     Physical  Exam  There were no vitals taken for this visit. Physical Exam Constitutional:      Appearance: Normal appearance.  Cardiovascular:     Rate and Rhythm: Normal rate and regular rhythm.  Pulmonary:     Effort: Pulmonary effort is normal.     Breath sounds: Normal breath sounds.  Skin:    General: Skin is warm and dry.  Neurological:     General: No focal deficit present.     Mental Status: He is alert and oriented to person, place, and time. Mental status is at baseline.  Psychiatric:        Mood and Affect: Mood normal.        Behavior: Behavior normal.        Thought Content: Thought content normal.        Judgment: Judgment normal.     Lab Results:  CBC    Component Value Date/Time   WBC 11.1 (H) 10/09/2020 1204   WBC 14.0 (H) 03/27/2020 1118   RBC 4.81 10/09/2020 1204  RBC 4.57 03/27/2020 1118   HGB 15.7 10/09/2020 1204   HCT 46.1 10/09/2020 1204   PLT 327 10/09/2020 1204   MCV 96 10/09/2020 1204   MCH 32.6 10/09/2020 1204   MCH 32.2 05/21/2017 0942   MCHC 34.1 10/09/2020 1204   MCHC 33.7 03/27/2020 1118   RDW 12.4 10/09/2020 1204   LYMPHSABS 1.9 03/27/2020 1118   MONOABS 1.1 (H) 03/27/2020 1118   EOSABS 0.3 03/27/2020 1118   BASOSABS 0.1 03/27/2020 1118    BMET    Component Value Date/Time   NA 138 10/09/2020 1204   K 4.8 10/09/2020 1204   CL 99 10/09/2020 1204   CO2 26 10/09/2020 1204   GLUCOSE 84 10/09/2020 1204   GLUCOSE 91 03/27/2020 1118   BUN 24 10/09/2020 1204   CREATININE 1.04 10/09/2020 1204   CALCIUM 10.1 10/09/2020 1204   GFRNONAA 70 10/09/2020 1204   GFRAA 81 10/09/2020 1204    BNP No results found for: BNP  ProBNP No results found for: PROBNP  Imaging: No results found.   Assessment & Plan:   No problem-specific Assessment & Plan notes found for this encounter.     Glenford Bayley, NP 04/22/2021

## 2021-04-22 NOTE — Patient Instructions (Addendum)
Overnight oximetry test showed that you do not need oxygen at night while wearing CPAP   Recommendations: - Stop Trelegy 100. Start Trelegy 200 one puff daily in morning (two samples and sending in RX) - Use Albuterol rescue inhaler 2 puffs every 6 hours for breakthrough shortness of breath  - Looks like you are starting pulmonary rehab end of the month on June 27th   Follow-up: - 6 months with Dr. Maple Hudson    COPD and Physical Activity Chronic obstructive pulmonary disease (COPD) is a long-term (chronic) condition that affects the lungs. COPD is a general term that can be used to describe many different lung problems that cause lung swelling (inflammation) and limit airflow, including chronic bronchitis and emphysema. The main symptom of COPD is shortness of breath, which makes it harder to do even simple tasks. This can also make it harder to exercise and be active. Talk with your health care provider about treatments to help you breathe better and actions you can take to prevent breathing problems during physical activity. What are the benefits of exercising with COPD? Exercising regularly is an important part of a healthy lifestyle. You can still exercise and do physical activities even though you have COPD. Exercise and physical activity improve your shortness of breath by increasing blood flow (circulation). This causes your heart to pump more oxygen through your body. Moderate exercise can improve your:  Oxygen use.  Energy level.  Shortness of breath.  Strength in your breathing muscles.  Heart health.  Sleep.  Self-esteem and feelings of self-worth.  Depression, stress, and anxiety levels. Exercise can benefit everyone with COPD. The severity of your disease may affect how hard you can exercise, especially at first, but everyone can benefit. Talk with your health care provider about how much exercise is safe for you, and which activities and exercises are safe for you.   What  actions can I take to prevent breathing problems during physical activity?  Sign up for a pulmonary rehabilitation program. This type of program may include: ? Education about lung diseases. ? Exercise classes that teach you how to exercise and be more active while improving your breathing. This usually involves:  Exercise using your lower extremities, such as a stationary bicycle.  About 30 minutes of exercise, 2 to 5 times per week, for 6 to 12 weeks  Strength training, such as push ups or leg lifts. ? Nutrition education. ? Group classes in which you can talk with others who also have COPD and learn ways to manage stress.  If you use an oxygen tank, you should use it while you exercise. Work with your health care provider to adjust your oxygen for your physical activity. Your resting flow rate is different from your flow rate during physical activity.  While you are exercising: ? Take slow breaths. ? Pace yourself and do not try to go too fast. ? Purse your lips while breathing out. Pursing your lips is similar to a kissing or whistling position. ? If doing exercise that uses a quick burst of effort, such as weight lifting:  Breathe in before starting the exercise.  Breathe out during the hardest part of the exercise (such as raising the weights). Where to find support You can find support for exercising with COPD from:  Your health care provider.  A pulmonary rehabilitation program.  Your local health department or community health programs.  Support groups, online or in-person. Your health care provider may be able to recommend support  groups. Where to find more information You can find more information about exercising with COPD from:  American Lung Association: [place].  COPD Foundation: [employer]gl. Contact a health care provider if:  Your symptoms get worse.  You have chest pain.  You have nausea.  You have a fever.  You have trouble talking or  catching your breath.  You want to start a new exercise program or a new activity. Summary  COPD is a general term that can be used to describe many different lung problems that cause lung swelling (inflammation) and limit airflow. This includes chronic bronchitis and emphysema.  Exercise and physical activity improve your shortness of breath by increasing blood flow (circulation). This causes your heart to provide more oxygen to your body.  Contact your health care provider before starting any exercise program or new activity. Ask your health care provider what exercises and activities are safe for you. This information is not intended to replace advice given to you by your health care provider. Make sure you discuss any questions you have with your health care provider. Document Revised: 02/22/2019 Document Reviewed: 11/25/2017 Elsevier Patient Education  2021 [employer].

## 2021-05-07 ENCOUNTER — Encounter: Payer: Self-pay | Admitting: Primary Care

## 2021-05-07 NOTE — Assessment & Plan Note (Addendum)
-   He is 80% compliant with CPAP use > 4 hours. Auto titrate 5-20cm h20.  - Overnight oximetry on CPAP did not show oxygen desaturation

## 2021-05-07 NOTE — Assessment & Plan Note (Addendum)
-   Recommend increasing Trelegy 200 one puff daily in morning (two samples and sending in RX) - Use Albuterol rescue inhaler 2 puffs every 6 hours for breakthrough shortness of breath  - Referred to pulmonary rehab, he has an appointment with them at the end of the month on June 27th

## 2021-05-08 ENCOUNTER — Telehealth (HOSPITAL_COMMUNITY): Payer: Self-pay | Admitting: *Deleted

## 2021-05-12 ENCOUNTER — Other Ambulatory Visit: Payer: Self-pay

## 2021-05-12 ENCOUNTER — Encounter (HOSPITAL_COMMUNITY)
Admission: RE | Admit: 2021-05-12 | Discharge: 2021-05-12 | Disposition: A | Discharge status: Home / Self Care | Payer: PPO | Source: Ambulatory Visit | Attending: Internal Medicine | Admitting: Internal Medicine

## 2021-05-12 VITALS — BP 156/86 | HR 78 | Ht 69.0 in | Wt 172.6 lb

## 2021-05-12 DIAGNOSIS — J449 Chronic obstructive pulmonary disease, unspecified: Secondary | ICD-10-CM | POA: Insufficient documentation

## 2021-05-12 NOTE — Progress Notes (Signed)
Ryan Diaz 75 y.o. male Pulmonary Rehab Orientation Note This patient who was referred to Pulmonary rehab by Dr. Maple Hudson with the diagnosis of COPD mixed type arrived today in Cardiac [facility] Pul[facility] Rehab. He arrived ambulatory with normal gait. He does not carry portable oxygen. Per pt, he uses oxygen never. Color good, skin warm and dry. Patient is oriented to time and place. Patient's medical history, psychosocial health, and medications reviewed. Psychosocial assessment reveals pt lives alone. Pt is currently retired. Pt states he has no hobbies. Pt reports his stress level is low. Areas of stress/anxiety include Health.  Pt does not exhibit signs of depression. He does report difficulty staying asleep, but does not attribute this to depression. PHQ2/9 score 0/3. Pt shows fair  coping skills with positive outlook . Patient has a history of continuous etoh abuse. Will continue to monitor and evaluate progress toward psychosocial goal(s) of ability to handle stress in healthy ways. Physical assessment reveals heart rate is normal, breath sounds clear to auscultation, no wheezes, rales, or rhonchi. Grip strength equal, strong. Distal pulses 3+ bilateral posterior tibial pulses present. Patient reports he does take medications as prescribed. Patient states he follows a Regular diet. The patient reports no specific efforts to gain or lose weight.. Patient's weight will be monitored closely. Demonstration and practice of PLB using pulse oximeter. Patient able to return demonstration satisfactorily. Safety and hand hygiene in the exercise area reviewed with patient. Patient voices understanding of the information reviewed. Department expectations discussed with patient and achievable goals were set. The patient shows enthusiasm about attending the program and we look forward to working with this nice gentleman. The patient completed a 6 min walk test today, 05/12/2021 and to begin exercise on Tuesday 05/20/2021.   [id number]

## 2021-05-12 NOTE — Progress Notes (Signed)
Pulmonary Individual Treatment Plan  Patient Details  Name: GRANVEL PROUDFOOT MRN: [id number] Date of Birth: 26-Jul-1946 Referring Provider:   Doristine Devoid Pulmonary Rehab Walk Test from 05/12/2021 in [facility] CARDIAC [facility]  Referring Provider Dr. Maple Hudson       Initial Encounter Date:  Flowsheet Row Pulmonary Rehab Walk Test from 05/12/2021 in [facility] CARDIAC REHAB  Date 05/12/21       Visit Diagnosis: Chronic obstructive pulmonary disease, unspecified COPD type (HCC)  Patient's Home Medications on Admission:   Current Outpatient Medications:    albuterol (PROVENTIL HFA;VENTOLIN HFA) 108 (90 BASE) MCG/ACT inhaler, Inhale 2 puffs into the lungs every 6 (six) hours as needed for wheezing or shortness of breath., Disp: , Rfl:    aspirin EC 81 MG tablet, Take 81 mg by mouth daily., Disp: , Rfl:    atorvastatin (LIPITOR) 80 MG tablet, Take 1 tablet (80 mg total) by mouth daily., Disp: 90 tablet, Rfl: 3   Fluticasone-Umeclidin-Vilant (TRELEGY ELLIPTA) 200-62.5-25 MCG/INH AEPB, Inhale 1 puff into the lungs daily., Disp: 60 each, Rfl: 5   Fluticasone-Umeclidin-Vilant (TRELEGY ELLIPTA) 200-62.5-25 MCG/INH AEPB, Inhale 1 puff into the lungs daily., Disp: 60 each, Rfl: 0   gabapentin (NEURONTIN) 100 MG capsule, Take 1 capsule (100 mg total) by mouth 3 (three) times daily. (Patient taking differently: Take 100 mg by mouth 3 (three) times daily as needed (pain).), Disp: 90 capsule, Rfl: 5   meloxicam (MOBIC) 15 MG tablet, 1 tab by mouth once daily as needed (Patient taking differently: Take 15 mg by mouth daily as needed for pain. 1 tab by mouth once daily as needed), Disp: 90 tablet, Rfl: 1   Omega-3 Fatty Acids (FISH OIL) 1000 MG CAPS, Take 1,000 mg by mouth 2 (two) times daily., Disp: , Rfl:    omeprazole (PRILOSEC) 20 MG capsule, Take 1 capsule (20 mg total) by mouth 2 (two) times daily. (Patient taking differently: Take 20 mg by mouth 2 (two) times daily as  needed (acid reflux).), Disp: 90 capsule, Rfl: 3   tadalafil (CIALIS) 20 MG tablet, Take 20 mg by mouth daily as needed for erectile dysfunction., Disp: , Rfl:    terazosin (HYTRIN) 2 MG capsule, Take 1 capsule (2 mg total) by mouth at bedtime., Disp: 90 capsule, Rfl: 3   traZODone (DESYREL) 50 MG tablet, Take 50 mg by mouth at bedtime as needed for sleep., Disp: , Rfl:    chlorhexidine (PERIDEX) 0.12 % solution, SMARTSIG:By Mouth (Patient not taking: Reported on 05/12/2021), Disp: , Rfl:    Cholecalciferol (VITAMIN D) 50 MCG (2000 UT) tablet, Take 2,000 Units by mouth daily. (Patient not taking: Reported on 05/12/2021), Disp: , Rfl:    clotrimazole (LOTRIMIN) 1 % cream, Apply 1 application topically 2 (two) times daily as needed (irritation). (Patient not taking: Reported on 05/12/2021), Disp: , Rfl:    docusate sodium (COLACE) 100 MG capsule, Take 100 mg by mouth daily as needed for mild constipation. (Patient not taking: Reported on 05/12/2021), Disp: , Rfl:    donepezil (ARICEPT) 5 MG tablet, Take 1 tablet (5 mg total) by mouth at bedtime. (Patient not taking: Reported on 05/12/2021), Disp: 90 tablet, Rfl: 5   Ferrous Sulfate 27 MG TABS, Take 27 mg by mouth daily as needed (pt prefrence). (Patient not taking: Reported on 05/12/2021), Disp: , Rfl:    fluocinonide ointment (LIDEX) 0.05 %, Apply 1 application topically 2 (two) times daily. (Patient not taking: Reported on 05/12/2021), Disp: 30 g,  Rfl: 2   fluticasone (CUTIVATE) 0.05 % cream, Apply 1 application topically daily as needed (irritation).  (Patient not taking: Reported on 05/12/2021), Disp: , Rfl:    Fluticasone-Umeclidin-Vilant (TRELEGY ELLIPTA) 100-62.5-25 MCG/INH AEPB, Inhale 1 puff into the lungs daily. (Patient not taking: Reported on 05/12/2021), Disp: 60 each, Rfl: 11   ipratropium (ATROVENT) 0.06 % nasal spray, Place 2 sprays into both nostrils 3 (three) times daily. (Patient not taking: Reported on 05/12/2021), Disp: 15 mL, Rfl: 5    nitroGLYCERIN (NITROSTAT) 0.4 MG SL tablet, Place 1 tablet (0.4 mg total) under the tongue every 5 (five) minutes as needed for chest pain. (Patient not taking: Reported on 05/12/2021), Disp: 25 tablet, Rfl: 4  Past Medical History: Past Medical History:  Diagnosis Date   ABUSE, ALCOHOL, CONTINUOUS 08/08/2007   ALLERGIC RHINITIS 08/08/2007   ANXIETY 08/08/2007   Carpal tunnel syndrome    COPD 10/03/2009   DEPRESSION 08/08/2007   ERECTILE DYSFUNCTION 08/08/2007   GENITAL HERPES, HX OF 08/08/2007   GERD 08/08/2007   GLUCOSE INTOLERANCE 08/08/2007   HYPERLIPIDEMIA 08/08/2007   HYPERTENSION 08/08/2007   Impaired glucose tolerance 09/26/2011   Insomnia 05/18/2012   PAIN IN SOFT TISSUES OF LIMB 10/02/2009   Sleep apnea    OSA-uses CPAP nightly    Tobacco Use: Social History   Tobacco Use  Smoking Status Former   Packs/day: 1.50   Years: 35.00   Pack years: 52.50   Types: Cigarettes   Quit date: 09/04/2009   Years since quitting: 11.6  Smokeless Tobacco Never    Labs: Recent Review Flowsheet Data     Labs for ITP Cardiac and Pulmonary Rehab Latest Ref Rng & Units 10/14/2017 03/09/2018 10/19/2018 03/27/2019 03/27/2020   Cholestrol 0 - 200 mg/dL 671 [phone]   LDLCALC 0 - 99 mg/dL 71 54 - 65 79   LDLDIRECT mg/dL - - 50.5 - -   HDL >39.76 mg/dL 73.41(P) 37.90(W) 40.97 39.70 37.40(L)   Trlycerides 0.0 - 149.0 mg/dL 35.3 299.0(H) 222.0(H) 114.0 155.0(H)   Hemoglobin A1c 4.6 - 6.5 % 5.7 5.6 5.8 5.9 5.9       Capillary Blood Glucose: No results found for: GLUCAP   Pulmonary Assessment Scores:  Pulmonary Assessment Scores     Row Name 05/12/21 1153 05/12/21 1218       ADL UCSD   ADL Phase Entry Entry    SOB Score total 32 --         CAT Score      CAT Score 11 --         mMRC Score      mMRC Score -- 1           UCSD: Self-administered rating of dyspnea associated with activities of daily living (ADLs) 6-point scale (0 = "not at all" to 5 = "maximal or unable to  do because of breathlessness")  Scoring Scores range from 0 to 120.  Minimally important difference is 5 units  CAT: CAT can identify the health impairment of COPD patients and is better correlated with disease progression.  CAT has a scoring range of zero to 40. The CAT score is classified into four groups of low (less than 10), medium (10 - 20), high (21-30) and very high (31-40) based on the impact level of disease on health status. A CAT score over 10 suggests significant symptoms.  A worsening CAT score could be explained by an exacerbation, poor medication adherence, poor inhaler technique, or progression of  COPD or comorbid conditions.  CAT MCID is 2 points  mMRC: mMRC (Modified Medical Research Council) Dyspnea Scale is used to assess the degree of baseline functional disability in patients of respiratory disease due to dyspnea. No minimal important difference is established. A decrease in score of 1 point or greater is considered a positive change.   Pulmonary Function Assessment:   Exercise Target Goals: Exercise Program Goal: Individual exercise prescription set using results from initial 6 min walk test and THRR while considering  patient's activity barriers and safety.   Exercise Prescription Goal: Initial exercise prescription builds to 30-45 minutes a day of aerobic activity, 2-3 days per week.  Home exercise guidelines will be given to patient during program as part of exercise prescription that the participant will acknowledge.  Activity Barriers & Risk Stratification:  Activity Barriers & Cardiac Risk Stratification - 05/12/21 1127       Activity Barriers & Cardiac Risk Stratification   Activity Barriers Arthritis;Joint Problems;Deconditioning;Muscular Weakness   has a torn ligament in right shoulder, surgery is not recommended.  Mobic helps with the shoulder pain.            6 Minute Walk:  6 Minute Walk     Row Name 05/12/21 1218         6 Minute Walk    Phase Initial     Distance 1534 feet     Walk Time 6 minutes     # of Rest Breaks 0     MPH 2.91     METS 3.57     RPE 11     Perceived Dyspnea  1     VO2 Peak 12.51     Symptoms No     Resting HR 63 bpm     Resting BP 162/89     Resting Oxygen Saturation  96 %     Exercise Oxygen Saturation  during 6 min walk 94 %     Max Ex. HR 97 bpm     Max Ex. BP 188/91     2 Minute Post BP 182/94           Interval HR     1 Minute HR 93     2 Minute HR 95     3 Minute HR 97     4 Minute HR 97     5 Minute HR 93     6 Minute HR 94     2 Minute Post HR 64     Interval Heart Rate? Yes           Interval Oxygen     Interval Oxygen? Yes     Baseline Oxygen Saturation % 96 %     1 Minute Oxygen Saturation % 96 %     1 Minute Liters of Oxygen 0 L     2 Minute Oxygen Saturation % 96 %     2 Minute Liters of Oxygen 0 L     3 Minute Oxygen Saturation % 94 %     3 Minute Liters of Oxygen 0 L     4 Minute Oxygen Saturation % 96 %     4 Minute Liters of Oxygen 0 L     5 Minute Oxygen Saturation % 96 %     5 Minute Liters of Oxygen 0 L     6 Minute Oxygen Saturation % 97 %     6 Minute Liters of Oxygen 0 L  2 Minute Post Oxygen Saturation % 99 %     2 Minute Post Liters of Oxygen 0 L             Oxygen Initial Assessment:  Oxygen Initial Assessment - 05/12/21 1153       Home Oxygen   Home Oxygen Device None    Sleep Oxygen Prescription CPAP    Home Exercise Oxygen Prescription None    Home Resting Oxygen Prescription None    Compliance with Home Oxygen Use Yes      Initial 6 min Walk   Oxygen Used None      Program Oxygen Prescription   Program Oxygen Prescription None      Intervention   Short Term Goals To learn and exhibit compliance with exercise, home and travel O2 prescription;To learn and understand importance of monitoring SPO2 with pulse oximeter and demonstrate accurate use of the pulse oximeter.;To learn and understand importance of maintaining oxygen  saturations>88%;To learn and demonstrate proper pursed lip breathing techniques or other breathing techniques. ;To learn and demonstrate proper use of respiratory medications    Long  Term Goals Exhibits compliance with exercise, home  and travel O2 prescription;Verbalizes importance of monitoring SPO2 with pulse oximeter and return demonstration;Maintenance of O2 saturations>88%;Exhibits proper breathing techniques, such as pursed lip breathing or other method taught during program session;Compliance with respiratory medication;Demonstrates proper use of MDI's             Oxygen Re-Evaluation:   Oxygen Discharge (Final Oxygen Re-Evaluation):   Initial Exercise Prescription:  Initial Exercise Prescription - 05/12/21 1200       Date of Initial Exercise RX and Referring Provider   Date 05/12/21    Referring Provider Dr. Maple Hudson    Expected Discharge Date 07/17/21      NuStep   Level 2    SPM 80    Minutes 15      Track   Minutes 16      Prescription Details   Frequency (times per week) 2    Duration Progress to 30 minutes of continuous aerobic without signs/symptoms of physical distress      Intensity   THRR 40-80% of Max Heartrate 58-116    Ratings of Perceived Exertion 11-13      Progression   Progression Continue to progress workloads to maintain intensity without signs/symptoms of physical distress.      Resistance Training   Training Prescription Yes    Weight Blue bands    Reps 10-15             Perform Capillary Blood Glucose checks as needed.  Exercise Prescription Changes:   Exercise Comments:   Exercise Goals and Review:   Exercise Goals     Row Name 05/12/21 1217             Exercise Goals   Increase Physical Activity Yes       Intervention Provide advice, education, support and counseling about physical activity/exercise needs.;Develop an individualized exercise prescription for aerobic and resistive training based on initial  evaluation findings, risk stratification, comorbidities and participant's personal goals.       Expected Outcomes Short Term: Attend rehab on a regular basis to increase amount of physical activity.;Long Term: Add in home exercise to make exercise part of routine and to increase amount of physical activity.;Long Term: Exercising regularly at least 3-5 days a week.       Increase Strength and Stamina Yes  Intervention Provide advice, education, support and counseling about physical activity/exercise needs.;Develop an individualized exercise prescription for aerobic and resistive training based on initial evaluation findings, risk stratification, comorbidities and participant's personal goals.       Expected Outcomes Short Term: Increase workloads from initial exercise prescription for resistance, speed, and METs.;Short Term: Perform resistance training exercises routinely during rehab and add in resistance training at home;Long Term: Improve cardiorespiratory fitness, muscular endurance and strength as measured by increased METs and functional capacity (6MWT)       Able to understand and use rate of perceived exertion (RPE) scale Yes       Intervention Provide education and explanation on how to use RPE scale       Expected Outcomes Short Term: Able to use RPE daily in rehab to express subjective intensity level;Long Term:  Able to use RPE to guide intensity level when exercising independently       Able to understand and use Dyspnea scale Yes       Intervention Provide education and explanation on how to use Dyspnea scale       Expected Outcomes Short Term: Able to use Dyspnea scale daily in rehab to express subjective sense of shortness of breath during exertion;Long Term: Able to use Dyspnea scale to guide intensity level when exercising independently       Knowledge and understanding of Target Heart Rate Range (THRR) Yes       Intervention Provide education and explanation of THRR including how  the numbers were predicted and where they are located for reference       Expected Outcomes Short Term: Able to state/look up THRR;Long Term: Able to use THRR to govern intensity when exercising independently;Short Term: Able to use daily as guideline for intensity in rehab       Understanding of Exercise Prescription Yes       Intervention Provide education, explanation, and written materials on patient's individual exercise prescription       Expected Outcomes Short Term: Able to explain program exercise prescription;Long Term: Able to explain home exercise prescription to exercise independently                Exercise Goals Re-Evaluation :   Discharge Exercise Prescription (Final Exercise Prescription Changes):   Nutrition:  Target Goals: Understanding of nutrition guidelines, daily intake of sodium 1500mg , cholesterol 200mg , calories 30% from fat and 7% or less from saturated fats, daily to have 5 or more servings of fruits and vegetables.  Biometrics:  Pre Biometrics - 05/12/21 1129       Pre Biometrics   Height 5\' 9"  (1.753 m)    Weight 78.3 kg    BMI (Calculated) 25.48    Grip Strength 24 kg              Nutrition Therapy Plan and Nutrition Goals:   Nutrition Assessments:  MEDIFICTS Score Key: ?70 Need to make dietary changes  40-70 Heart Healthy Diet ? 40 Therapeutic Level Cholesterol Diet   Picture Your Plate Scores: <16<40 Unhealthy dietary pattern with much room for improvement. 41-50 Dietary pattern unlikely to meet recommendations for good health and room for improvement. 51-60 More healthful dietary pattern, with some room for improvement.  >60 Healthy dietary pattern, although there may be some specific behaviors that could be improved.    Nutrition Goals Re-Evaluation:   Nutrition Goals Discharge (Final Nutrition Goals Re-Evaluation):   Psychosocial: Target Goals: Acknowledge presence or absence of significant depression and/or stress,  maximize coping skills, provide positive support system. Participant is able to verbalize types and ability to use techniques and skills needed for reducing stress and depression.  Initial Review & Psychosocial Screening:  Initial Psych Review & Screening - 05/12/21 1154       Initial Review   Current issues with Current Sleep Concerns      Family Dynamics   Good Support System? Yes   son and a friend     Barriers   Psychosocial barriers to participate in program There are no identifiable barriers or psychosocial needs.      Screening Interventions   Interventions Encouraged to exercise    Expected Outcomes Long Term goal: The participant improves quality of Life and PHQ9 Scores as seen by post scores and/or verbalization of changes             Quality of Life Scores:  Scores of 19 and below usually indicate a poorer quality of life in these areas.  A difference of  2-3 points is a clinically meaningful difference.  A difference of 2-3 points in the total score of the Quality of Life Index has been associated with significant improvement in overall quality of life, self-image, physical symptoms, and general health in studies assessing change in quality of life.  PHQ-9: Recent Review Flowsheet Data     Depression screen [facility] 2/9 05/12/2021 10/03/2020 07/26/2020 03/27/2020 03/27/2020   Decreased Interest 0 0 0 0 0   Down, Depressed, Hopeless 0 0 0 0 0   PHQ - 2 Score 0 0 0 0 0   Altered sleeping 3  - - - -   Tired, decreased energy 0 - - - -   Change in appetite 0 - - - -   Feeling bad or failure about yourself  0 - - - -   Trouble concentrating 0 - - - -   Moving slowly or fidgety/restless 0 - - - -   Suicidal thoughts 0 - - - -   PHQ-9 Score 3 - - - -   Difficult doing work/chores Not difficult at all - - - -      Interpretation of Total Score  Total Score Depression Severity:  1-4 = Minimal depression, 5-9 = Mild depression, 10-14 = Moderate depression, 15-19 =  Moderately severe depression, 20-27 = Severe depression   Psychosocial Evaluation and Intervention:  Psychosocial Evaluation - 05/12/21 1155       Psychosocial Evaluation & Interventions   Interventions Encouraged to exercise with the program and follow exercise prescription    Comments no psychosocial concerns identified at this time    Expected Outcomes For patient to continue to be free of psychosocial concerns while participating in pulmonary rehab.    Continue Psychosocial Services  No Follow up required             Psychosocial Re-Evaluation:   Psychosocial Discharge (Final Psychosocial Re-Evaluation):   Education: Education Goals: Education classes will be provided on a weekly basis, covering required topics. Participant will state understanding/return demonstration of topics presented.  Learning Barriers/Preferences:  Learning Barriers/Preferences - 05/12/21 1258       Learning Barriers/Preferences   Learning Barriers None    Learning Preferences Audio;Computer/Internet;Group Instruction;Individual Instruction;Pictoral;Skilled Demonstration;Written Material             Education Topics: Risk Factor Reduction:  -Group instruction that is supported by a PowerPoint presentation. Instructor discusses the definition of a risk factor, different risk factors for pulmonary disease, and how  the heart and lungs work together.     Nutrition for Pulmonary Patient:  -Group instruction provided by PowerPoint slides, verbal discussion, and written materials to support subject matter. The instructor gives an explanation and review of healthy diet recommendations, which includes a discussion on weight management, recommendations for fruit and vegetable consumption, as well as protein, fluid, caffeine, fiber, sodium, sugar, and alcohol. Tips for eating when patients are short of breath are discussed.   Pursed Lip Breathing:  -Group instruction that is supported by  demonstration and informational handouts. Instructor discusses the benefits of pursed lip and diaphragmatic breathing and detailed demonstration on how to preform both.     Oxygen Safety:  -Group instruction provided by PowerPoint, verbal discussion, and written material to support subject matter. There is an overview of "What is Oxygen" and "Why do we need it".  Instructor also reviews how to create a safe environment for oxygen use, the importance of using oxygen as prescribed, and the risks of noncompliance. There is a brief discussion on traveling with oxygen and resources the patient may utilize.   Oxygen Equipment:  -Group instruction provided by [facility] Staff utilizing handouts, written materials, and equipment demonstrations.   Signs and Symptoms:  -Group instruction provided by written material and verbal discussion to support subject matter. Warning signs and symptoms of infection, stroke, and heart attack are reviewed and when to call the physician/911 reinforced. Tips for preventing the spread of infection discussed.   Advanced Directives:  -Group instruction provided by verbal instruction and written material to support subject matter. Instructor reviews Advanced Directive laws and proper instruction for filling out document.   Pulmonary Video:  -Group video education that reviews the importance of medication and oxygen compliance, exercise, good nutrition, pulmonary hygiene, and pursed lip and diaphragmatic breathing for the pulmonary patient.   Exercise for the Pulmonary Patient:  -Group instruction that is supported by a PowerPoint presentation. Instructor discusses benefits of exercise, core components of exercise, frequency, duration, and intensity of an exercise routine, importance of utilizing pulse oximetry during exercise, safety while exercising, and options of places to exercise outside of rehab.     Pulmonary Medications:  -Verbally interactive group education  provided by instructor with focus on inhaled medications and proper administration.   Anatomy and Physiology of the Respiratory System and Intimacy:  -Group instruction provided by PowerPoint, verbal discussion, and written material to support subject matter. Instructor reviews respiratory cycle and anatomical components of the respiratory system and their functions. Instructor also reviews differences in obstructive and restrictive respiratory diseases with examples of each. Intimacy, Sex, and Sexuality differences are reviewed with a discussion on how relationships can change when diagnosed with pulmonary disease. Common sexual concerns are reviewed.   MD DAY -A group question and answer session with a medical doctor that allows participants to ask questions that relate to their pulmonary disease state.   OTHER EDUCATION -Group or individual verbal, written, or video instructions that support the educational goals of the pulmonary rehab program.   Holiday Eating Survival Tips:  -Group instruction provided by PowerPoint slides, verbal discussion, and written materials to support subject matter. The instructor gives patients tips, tricks, and techniques to help them not only survive but enjoy the holidays despite the onslaught of food that accompanies the holidays.   Knowledge Questionnaire Score:  Knowledge Questionnaire Score - 05/12/21 1154       Knowledge Questionnaire Score   Pre Score 13/18  Core Components/Risk Factors/Patient Goals at Admission:  Personal Goals and Risk Factors at Admission - 05/12/21 1157       Core Components/Risk Factors/Patient Goals on Admission   Improve shortness of breath with ADL's Yes    Intervention Provide education, individualized exercise plan and daily activity instruction to help decrease symptoms of SOB with activities of daily living.    Expected Outcomes Short Term: Improve cardiorespiratory fitness to achieve a reduction  of symptoms when performing ADLs;Long Term: Be able to perform more ADLs without symptoms or delay the onset of symptoms             Core Components/Risk Factors/Patient Goals Review:   Goals and Risk Factor Review     Row Name 05/12/21 1157             Core Components/Risk Factors/Patient Goals Review   Personal Goals Review Develop more efficient breathing techniques such as purse lipped breathing and diaphragmatic breathing and practicing self-pacing with activity.;Increase knowledge of respiratory medications and ability to use respiratory devices properly.;Improve shortness of breath with ADL's                Core Components/Risk Factors/Patient Goals at Discharge (Final Review):   Goals and Risk Factor Review - 05/12/21 1157       Core Components/Risk Factors/Patient Goals Review   Personal Goals Review Develop more efficient breathing techniques such as purse lipped breathing and diaphragmatic breathing and practicing self-pacing with activity.;Increase knowledge of respiratory medications and ability to use respiratory devices properly.;Improve shortness of breath with ADL's             ITP Comments:   Comments:

## 2021-05-12 NOTE — Progress Notes (Signed)
Ryan Diaz 75 y.o. male  Initial Psychosocial Assessment  Pt psychosocial assessment reveals pt lives alone. Pt is currently retired. Pt reports no hobbies. Pt reports his stress level is low. Areas of stress/anxiety include Health.  Pt does not exhibit  signs of depression. He does have difficulty staying asleep and takes a sleeping medication when he feels he is sleep deprived, but does not attribute this to depression Pt shows fair  coping skills with positive outlook . Monitor and evaluate progress toward psychosocial goal(s).  Goal(s): Improved management of stress in healthy ways instead of etoh use Improved coping skills  Help patient work toward returning to meaningful activities that improve patient's QOL and are attainable with patient's lung disease   05/12/2021 1:11 PM

## 2021-05-13 ENCOUNTER — Encounter: Payer: Self-pay | Admitting: Internal Medicine

## 2021-05-13 ENCOUNTER — Ambulatory Visit (INDEPENDENT_AMBULATORY_CARE_PROVIDER_SITE_OTHER): Payer: PPO | Admitting: Internal Medicine

## 2021-05-13 VITALS — BP 162/86 | HR 74 | Temp 98.5°F | Ht 69.0 in | Wt 168.0 lb

## 2021-05-13 DIAGNOSIS — M19039 Primary osteoarthritis, unspecified wrist: Secondary | ICD-10-CM | POA: Insufficient documentation

## 2021-05-13 DIAGNOSIS — Z7289 Other problems related to lifestyle: Secondary | ICD-10-CM | POA: Insufficient documentation

## 2021-05-13 DIAGNOSIS — F1090 Alcohol use, unspecified, uncomplicated: Secondary | ICD-10-CM | POA: Insufficient documentation

## 2021-05-13 DIAGNOSIS — H04123 Dry eye syndrome of bilateral lacrimal glands: Secondary | ICD-10-CM | POA: Insufficient documentation

## 2021-05-13 DIAGNOSIS — F411 Generalized anxiety disorder: Secondary | ICD-10-CM | POA: Insufficient documentation

## 2021-05-13 DIAGNOSIS — Z125 Encounter for screening for malignant neoplasm of prostate: Secondary | ICD-10-CM

## 2021-05-13 DIAGNOSIS — H0289 Other specified disorders of eyelid: Secondary | ICD-10-CM | POA: Insufficient documentation

## 2021-05-13 DIAGNOSIS — F1010 Alcohol abuse, uncomplicated: Secondary | ICD-10-CM | POA: Insufficient documentation

## 2021-05-13 DIAGNOSIS — E559 Vitamin D deficiency, unspecified: Secondary | ICD-10-CM

## 2021-05-13 DIAGNOSIS — H02109 Unspecified ectropion of unspecified eye, unspecified eyelid: Secondary | ICD-10-CM | POA: Insufficient documentation

## 2021-05-13 DIAGNOSIS — N400 Enlarged prostate without lower urinary tract symptoms: Secondary | ICD-10-CM | POA: Insufficient documentation

## 2021-05-13 DIAGNOSIS — H02413 Mechanical ptosis of bilateral eyelids: Secondary | ICD-10-CM | POA: Insufficient documentation

## 2021-05-13 DIAGNOSIS — N486 Induration penis plastica: Secondary | ICD-10-CM | POA: Insufficient documentation

## 2021-05-13 DIAGNOSIS — H02105 Unspecified ectropion of left lower eyelid: Secondary | ICD-10-CM | POA: Insufficient documentation

## 2021-05-13 DIAGNOSIS — H02831 Dermatochalasis of right upper eyelid: Secondary | ICD-10-CM | POA: Insufficient documentation

## 2021-05-13 DIAGNOSIS — R7302 Impaired glucose tolerance (oral): Secondary | ICD-10-CM

## 2021-05-13 DIAGNOSIS — K089 Disorder of teeth and supporting structures, unspecified: Secondary | ICD-10-CM | POA: Insufficient documentation

## 2021-05-13 DIAGNOSIS — H02132 Senile ectropion of right lower eyelid: Secondary | ICD-10-CM | POA: Insufficient documentation

## 2021-05-13 DIAGNOSIS — M545 Low back pain, unspecified: Secondary | ICD-10-CM | POA: Insufficient documentation

## 2021-05-13 DIAGNOSIS — N529 Male erectile dysfunction, unspecified: Secondary | ICD-10-CM | POA: Insufficient documentation

## 2021-05-13 DIAGNOSIS — I10 Essential (primary) hypertension: Secondary | ICD-10-CM

## 2021-05-13 DIAGNOSIS — E538 Deficiency of other specified B group vitamins: Secondary | ICD-10-CM

## 2021-05-13 DIAGNOSIS — H40013 Open angle with borderline findings, low risk, bilateral: Secondary | ICD-10-CM | POA: Insufficient documentation

## 2021-05-13 DIAGNOSIS — Z0000 Encounter for general adult medical examination without abnormal findings: Secondary | ICD-10-CM | POA: Insufficient documentation

## 2021-05-13 DIAGNOSIS — Z87891 Personal history of nicotine dependence: Secondary | ICD-10-CM | POA: Insufficient documentation

## 2021-05-13 DIAGNOSIS — H6120 Impacted cerumen, unspecified ear: Secondary | ICD-10-CM | POA: Insufficient documentation

## 2021-05-13 LAB — CBC WITH DIFFERENTIAL/PLATELET
Basophils Absolute: 0.1 10*3/uL (ref 0.0–0.1)
Basophils Relative: 1.1 % (ref 0.0–3.0)
Eosinophils Absolute: 0.3 10*3/uL (ref 0.0–0.7)
Eosinophils Relative: 5.4 % — ABNORMAL HIGH (ref 0.0–5.0)
HCT: 45.5 % (ref 39.0–52.0)
Hemoglobin: 15.4 g/dL (ref 13.0–17.0)
Lymphocytes Relative: 31.6 % (ref 12.0–46.0)
Lymphs Abs: 1.9 10*3/uL (ref 0.7–4.0)
MCHC: 33.9 g/dL (ref 30.0–36.0)
MCV: 96.8 fl (ref 78.0–100.0)
Monocytes Absolute: 0.7 10*3/uL (ref 0.1–1.0)
Monocytes Relative: 11.4 % (ref 3.0–12.0)
Neutro Abs: 3 10*3/uL (ref 1.4–7.7)
Neutrophils Relative %: 50.5 % (ref 43.0–77.0)
Platelets: 332 10*3/uL (ref 150.0–400.0)
RBC: 4.7 Mil/uL (ref 4.22–5.81)
RDW: 14.1 % (ref 11.5–15.5)
WBC: 6 10*3/uL (ref 4.0–10.5)

## 2021-05-13 LAB — URINALYSIS, ROUTINE W REFLEX MICROSCOPIC
Bilirubin Urine: NEGATIVE
Hgb urine dipstick: NEGATIVE
Ketones, ur: NEGATIVE
Leukocytes,Ua: NEGATIVE
Nitrite: NEGATIVE
RBC / HPF: NONE SEEN (ref 0–?)
Specific Gravity, Urine: 1.02 (ref 1.000–1.030)
Total Protein, Urine: NEGATIVE
Urine Glucose: NEGATIVE
Urobilinogen, UA: 0.2 (ref 0.0–1.0)
WBC, UA: NONE SEEN (ref 0–?)
pH: 6.5 (ref 5.0–8.0)

## 2021-05-13 LAB — HEPATIC FUNCTION PANEL
ALT: 27 U/L (ref 0–53)
AST: 23 U/L (ref 0–37)
Albumin: 4.6 g/dL (ref 3.5–5.2)
Alkaline Phosphatase: 72 U/L (ref 39–117)
Bilirubin, Direct: 0.1 mg/dL (ref 0.0–0.3)
Total Bilirubin: 0.4 mg/dL (ref 0.2–1.2)
Total Protein: 7.5 g/dL (ref 6.0–8.3)

## 2021-05-13 LAB — VITAMIN D 25 HYDROXY (VIT D DEFICIENCY, FRACTURES): VITD: 32.85 ng/mL (ref 30.00–100.00)

## 2021-05-13 LAB — LDL CHOLESTEROL, DIRECT: Direct LDL: 96 mg/dL

## 2021-05-13 LAB — LIPID PANEL
Cholesterol: 165 mg/dL (ref 0–200)
HDL: 42.8 mg/dL (ref >39.00)
NonHDL: 122.52
Total CHOL/HDL Ratio: 4
Triglycerides: 203 mg/dL — ABNORMAL HIGH (ref 0.0–149.0)
VLDL: 40.6 mg/dL — ABNORMAL HIGH (ref 0.0–40.0)

## 2021-05-13 LAB — BASIC METABOLIC PANEL
BUN: 14 mg/dL (ref 6–23)
CO2: 25 mEq/L (ref 19–32)
Calcium: 9.5 mg/dL (ref 8.4–10.5)
Chloride: 103 mEq/L (ref 96–112)
Creatinine, Ser: 1.2 mg/dL (ref 0.40–1.50)
GFR: 59.27 mL/min — ABNORMAL LOW (ref >60.00)
Glucose, Bld: 84 mg/dL (ref 70–99)
Potassium: 4.4 mEq/L (ref 3.5–5.1)
Sodium: 138 mEq/L (ref 135–145)

## 2021-05-13 LAB — TSH: TSH: 0.84 u[IU]/mL (ref 0.35–4.50)

## 2021-05-13 LAB — HEMOGLOBIN A1C: Hgb A1c MFr Bld: 5.2 % (ref 4.6–6.5)

## 2021-05-13 LAB — VITAMIN B12: Vitamin B-12: 398 pg/mL (ref 211–911)

## 2021-05-13 LAB — PSA: PSA: 1 ng/mL (ref 0.10–4.00)

## 2021-05-13 MED ORDER — LOSARTAN POTASSIUM 50 MG PO TABS: 50.0000 mg | ORAL_TABLET | Freq: Every day | ORAL | 3 refills | Status: DC

## 2021-05-13 NOTE — Progress Notes (Signed)
Patient ID: Ryan Diaz, male   DOB: 1946-05-19, 75 y.o.   MRN: [id number]         Chief Complaint:: wellness exam and Hypertension         HPI:  Ryan Diaz is a 75 y.o. male here for wellness exam; up to date with preventive referrals and immunizations,                        Also Pt denies chest pain, increased sob or doe, wheezing, orthopnea, PND, increased LE swelling, palpitations, dizziness or syncope. Pt denies new neurological symptoms such as new headache, or facial or extremity weakness or numbness   Pt denies polydipsia, polyuria, or new focal neuro s/s.   Pt denies fever, wt loss, night sweats, loss of appetite, or other constitutional symptoms  no other new complaints    Wt Readings from Last 3 Encounters:  05/13/21 168 lb (76.2 kg)  05/12/21 172 lb 9.9 oz (78.3 kg)  04/22/21 170 lb 6.4 oz (77.3 kg)   BP Readings from Last 3 Encounters:  05/13/21 (!) 162/86  05/12/21 (!) 156/86  04/22/21 124/80   Immunization History  Administered Date(s) Administered   Fluad Quad(high Dose 65+) 08/04/2019, 07/26/2020   H1N1 09/16/2008   Influenza Split 08/27/2013, 07/28/2017, 09/16/2018   Influenza Whole 08/17/2005, 09/16/2009   Influenza, High Dose Seasonal PF 08/18/2017   Influenza, Seasonal, Injecte, Preservative Fre 08/12/2010, 09/07/2011, 09/15/2012, 08/29/2013   Influenza,inj,Quad PF,6+ Mos 07/27/2016   Influenza,trivalent, recombinat, inj, PF 08/27/2018   Influenza-Unspecified 07/17/2006, 07/18/2007, 08/16/2008, 09/18/2009, 10/04/2014, 10/17/2015, 09/16/2016, 08/23/2017, 08/16/2018, 09/16/2018, 08/04/2019, 07/26/2020   PFIZER Comirnaty(Gray Top)Covid-19 Tri-Sucrose Vaccine 03/13/2021   PFIZER(Purple Top)SARS-COV-2 Vaccination 12/09/2019, 12/30/2019, 08/13/2020   Pneumococcal Conjugate-13 04/10/2014, 06/27/2014   Pneumococcal Polysaccharide-23 08/19/2006, 05/11/2007, 09/15/2012, 03/09/2018   Td 01/01/2017   Tdap 05/11/2007   Zoster Recombinat (Shingrix) 01/04/2018,  04/04/2018   Zoster, Live 08/19/2006, 01/01/2012  There are no preventive care reminders to display for this patient.    Past Medical History:  Diagnosis Date   ABUSE, ALCOHOL, CONTINUOUS 08/08/2007   ALLERGIC RHINITIS 08/08/2007   ANXIETY 08/08/2007   Carpal tunnel syndrome    COPD 10/03/2009   DEPRESSION 08/08/2007   ERECTILE DYSFUNCTION 08/08/2007   GENITAL HERPES, HX OF 08/08/2007   GERD 08/08/2007   GLUCOSE INTOLERANCE 08/08/2007   HYPERLIPIDEMIA 08/08/2007   HYPERTENSION 08/08/2007   Impaired glucose tolerance 09/26/2011   Insomnia 05/18/2012   PAIN IN SOFT TISSUES OF LIMB 10/02/2009   Sleep apnea    OSA-uses CPAP nightly   Past Surgical History:  Procedure Laterality Date   ACHILLES TENDON REPAIR Bilateral    CARPAL TUNNEL RELEASE Right 08/18/2018   Procedure: RIGHT CARPAL TUNNEL RELEASE;  Surgeon: Betha Loa, MD;  Location: [facility];  Service: Orthopedics;  Laterality: Right;   CARPAL TUNNEL RELEASE Left 11/21/2018   Procedure: LEFT CARPAL TUNNEL RELEASE;  Surgeon: Betha Loa, MD;  Location: [facility];  Service: Orthopedics;  Laterality: Left;  Bier block   COLONOSCOPY     LEFT HEART CATH AND CORONARY ANGIOGRAPHY N/A 10/17/2020   Procedure: LEFT HEART CATH AND CORONARY ANGIOGRAPHY;  Surgeon: Yvonne Kendall, MD;  Location: [facility];  Service: Cardiovascular;  Laterality: N/A;    reports that he quit smoking about 11 years ago. His smoking use included cigarettes. He has a 52.50 pack-year smoking history. He has never used smokeless tobacco. He reports current alcohol use of about 46.0 -  53.0 standard drinks of alcohol per week. He reports current drug use. Drug: Marijuana. family history includes Colon polyps in his brother. Allergies  Allergen Reactions   Sildenafil Palpitations   Current Outpatient Medications on File Prior to Visit  Medication Sig Dispense Refill   albuterol (PROVENTIL HFA;VENTOLIN HFA) 108 (90 BASE) MCG/ACT  inhaler Inhale 2 puffs into the lungs every 6 (six) hours as needed for wheezing or shortness of breath.     aspirin EC 81 MG tablet Take 81 mg by mouth daily.     atorvastatin (LIPITOR) 80 MG tablet Take 1 tablet (80 mg total) by mouth daily. 90 tablet 3   chlorhexidine (PERIDEX) 0.12 % solution      Cholecalciferol (VITAMIN D) 50 MCG (2000 UT) tablet Take 2,000 Units by mouth daily.     clotrimazole (LOTRIMIN) 1 % cream Apply 1 application topically 2 (two) times daily as needed (irritation).     docusate sodium (COLACE) 100 MG capsule Take 100 mg by mouth daily as needed for mild constipation.     donepezil (ARICEPT) 5 MG tablet Take 1 tablet (5 mg total) by mouth at bedtime. 90 tablet 5   Ferrous Sulfate 27 MG TABS Take 27 mg by mouth daily as needed (pt prefrence).     fluocinonide ointment (LIDEX) 0.05 % Apply 1 application topically 2 (two) times daily. 30 g 2   fluticasone (CUTIVATE) 0.05 % cream Apply 1 application topically daily as needed (irritation).     Fluticasone-Umeclidin-Vilant (TRELEGY ELLIPTA) 100-62.5-25 MCG/INH AEPB Inhale 1 puff into the lungs daily. 60 each 11   Fluticasone-Umeclidin-Vilant (TRELEGY ELLIPTA) 200-62.5-25 MCG/INH AEPB Inhale 1 puff into the lungs daily. 60 each 5   Fluticasone-Umeclidin-Vilant (TRELEGY ELLIPTA) 200-62.5-25 MCG/INH AEPB Inhale 1 puff into the lungs daily. 60 each 0   gabapentin (NEURONTIN) 100 MG capsule Take 1 capsule (100 mg total) by mouth 3 (three) times daily. (Patient taking differently: Take 100 mg by mouth 3 (three) times daily as needed (pain).) 90 capsule 5   ipratropium (ATROVENT) 0.06 % nasal spray Place 2 sprays into both nostrils 3 (three) times daily. 15 mL 5   meloxicam (MOBIC) 15 MG tablet 1 tab by mouth once daily as needed (Patient taking differently: Take 15 mg by mouth daily as needed for pain. 1 tab by mouth once daily as needed) 90 tablet 1   nitroGLYCERIN (NITROSTAT) 0.4 MG SL tablet Place 1 tablet (0.4 mg total) under  the tongue every 5 (five) minutes as needed for chest pain. 25 tablet 4   Omega-3 Fatty Acids (FISH OIL) 1000 MG CAPS Take 1,000 mg by mouth 2 (two) times daily.     omeprazole (PRILOSEC) 20 MG capsule Take 1 capsule (20 mg total) by mouth 2 (two) times daily. (Patient taking differently: Take 20 mg by mouth 2 (two) times daily as needed (acid reflux).) 90 capsule 3   tadalafil (CIALIS) 20 MG tablet Take 20 mg by mouth daily as needed for erectile dysfunction.     terazosin (HYTRIN) 2 MG capsule Take 1 capsule (2 mg total) by mouth at bedtime. 90 capsule 3   traZODone (DESYREL) 50 MG tablet Take 50 mg by mouth at bedtime as needed for sleep.     No current facility-administered medications on file prior to visit.        ROS:  All others reviewed and negative.  Objective        PE:  BP (!) 162/86 (BP Location: Left Arm, Patient Position: Sitting,  Cuff Size: Normal)   Pulse 74   Temp 98.5 F (36.9 C) (Oral)   Ht 5\' 9"  (1.753 m)   Wt 168 lb (76.2 kg)   SpO2 95%   BMI 24.81 kg/m                 Constitutional: Pt appears in NAD               HENT: Head: NCAT.                Right Ear: External ear normal.                 Left Ear: External ear normal.                Eyes: . Pupils are equal, round, and reactive to light. Conjunctivae and EOM are normal               Nose: without d/c or deformity               Neck: Neck supple. Gross normal ROM               Cardiovascular: Normal rate and regular rhythm.                 Pulmonary/Chest: Effort normal and breath sounds without rales or wheezing.                Abd:  Soft, NT, ND, + BS, no organomegaly               Neurological: Pt is alert. At baseline orientation, motor grossly intact               Skin: Skin is warm. No rashes, no other new lesions, LE edema - none               Psychiatric: Pt behavior is normal without agitation   Micro: none  Cardiac tracings I have personally interpreted today:  none  Pertinent  Radiological findings (summarize): none   Lab Results  Component Value Date   WBC 6.0 05/13/2021   HGB 15.4 05/13/2021   HCT 45.5 05/13/2021   PLT 332.0 05/13/2021   GLUCOSE 84 05/13/2021   CHOL 165 05/13/2021   TRIG 203.0 (H) 05/13/2021   HDL 42.80 05/13/2021   LDLDIRECT 96.0 05/13/2021   LDLCALC 79 03/27/2020   ALT 27 05/13/2021   AST 23 05/13/2021   NA 138 05/13/2021   K 4.4 05/13/2021   CL 103 05/13/2021   CREATININE 1.20 05/13/2021   BUN 14 05/13/2021   CO2 25 05/13/2021   TSH 0.84 05/13/2021   PSA 1.00 05/13/2021   HGBA1C 5.2 05/13/2021   Assessment/Plan:  Ryan Diaz is a 75 y.o. Black or African American [2] male with  has a past medical history of ABUSE, ALCOHOL, CONTINUOUS (08/08/2007), ALLERGIC RHINITIS (08/08/2007), ANXIETY (08/08/2007), Carpal tunnel syndrome, COPD (10/03/2009), DEPRESSION (08/08/2007), ERECTILE DYSFUNCTION (08/08/2007), GENITAL HERPES, HX OF (08/08/2007), GERD (08/08/2007), GLUCOSE INTOLERANCE (08/08/2007), HYPERLIPIDEMIA (08/08/2007), HYPERTENSION (08/08/2007), Impaired glucose tolerance (09/26/2011), Insomnia (05/18/2012), PAIN IN SOFT TISSUES OF LIMB (10/02/2009), and Sleep apnea.  Preventative health care Age and sex appropriate education and counseling updated with regular exercise and diet Referrals for preventative services - none needed Immunizations addressed - none needed Smoking counseling  - none needed Evidence for depression or other mood disorder - none significant Most recent labs reviewed. I have personally reviewed and have noted: 1) the patient's medical and social  history 2) The patient's current medications and supplements 3) The patient's height, weight, and BMI have been recorded in the chart   Essential hypertension Mild uncontrolled, to start losartan 50 qd  Impaired glucose tolerance Lab Results  Component Value Date   HGBA1C 5.2 05/13/2021   Stable, pt to continue current medical treatment  - diet   Vitamin D  deficiency Last vitamin D Lab Results  Component Value Date   VD25OH 32.85 05/13/2021   Stable, cont oral replacement  Followup: No follow-ups on file.  Oliver Barre, MD 05/17/2021 10:42 PM [facility]

## 2021-05-13 NOTE — Patient Instructions (Signed)
Please take all new medication as prescribed - the losartan 50 mg per day for blood pressure  Please continue all other medications as before, and refills have been done if requested.  Please have the pharmacy call with any other refills you may need.  Please continue your efforts at being more active, low cholesterol diet, and weight control.  You are otherwise up to date with prevention measures today.  Please keep your appointments with your specialists as you may have planned  Please go to the LAB at the blood drawing area for the tests to be done  You will be contacted by phone if any changes need to be made immediately.  Otherwise, you will receive a letter about your results with an explanation, but please check with MyChart first.  Please remember to sign up for MyChart if you have not done so, as this will be important to you in the future with finding out test results, communicating by private email, and scheduling acute appointments online when needed.  Please make an Appointment to return for your 1 MONTH visit

## 2021-05-17 ENCOUNTER — Encounter: Payer: Self-pay | Admitting: Internal Medicine

## 2021-05-17 NOTE — Assessment & Plan Note (Signed)
Lab Results  Component Value Date   HGBA1C 5.2 05/13/2021   Stable, pt to continue current medical treatment  - diet

## 2021-05-17 NOTE — Assessment & Plan Note (Signed)
Last vitamin D Lab Results  Component Value Date   VD25OH 32.85 05/13/2021   Stable, cont oral replacement

## 2021-05-17 NOTE — Assessment & Plan Note (Signed)
Mild uncontrolled, to start losartan 50 qd 

## 2021-05-17 NOTE — Assessment & Plan Note (Signed)

## 2021-05-20 ENCOUNTER — Other Ambulatory Visit: Payer: Self-pay

## 2021-05-20 ENCOUNTER — Encounter (HOSPITAL_COMMUNITY)
Admission: RE | Admit: 2021-05-20 | Discharge: 2021-05-20 | Disposition: A | Discharge status: Home / Self Care | Payer: PPO | Source: Ambulatory Visit | Attending: Internal Medicine | Admitting: Internal Medicine

## 2021-05-20 DIAGNOSIS — J449 Chronic obstructive pulmonary disease, unspecified: Secondary | ICD-10-CM | POA: Diagnosis not present

## 2021-05-20 NOTE — Progress Notes (Signed)
Daily Session Note  Patient Details  Name: Ryan Diaz MRN: [id number] Date of Birth: 1946-09-15 Referring Provider:   [name] Pul[facility] Rehab Walk Test from 05/12/2021 in [place]  Referring Provider Dr. Annamaria Boots       Encounter Date: 05/20/2021  Check In:  Session Check In - 05/20/21 1143       Check-In   Supervising physician immediately available to respond to emergencies [facility] immediately available    Physician(s) Dr. Alfredia Ferguson    Location [facility]    Staff Present Rosebud Poles, RN, Milus Glazier, MS, ACSM-CEP, CCRP, Exercise Physiologist;Lisa Jani Gravel, MS, ACSM-CEP, Exercise Physiologist    Virtual Visit No    Medication changes reported     No    Fall or balance concerns reported    No    Tobacco Cessation No Change    Warm-up and Cool-down Performed as group-led instruction    Resistance Training Performed Yes    VAD Patient? No    PAD/SET Patient? No      Pain Assessment   Currently in Pain? No/denies    Multiple Pain Sites No             Capillary Blood Glucose: No results found for this or any previous visit (from the past 24 hour(s)).    Social History   Tobacco Use  Smoking Status Former   Packs/day: 1.50   Years: 35.00   Pack years: 52.50   Types: Cigarettes   Quit date: 09/04/2009   Years since quitting: 11.7  Smokeless Tobacco Never    Goals Met:  Proper associated with RPD/PD & O2 Sat Exercise tolerated well No report of cardiac concerns or symptoms Strength training completed today   Goals Unmet:  Not Applicable  Comments: Service time is from 1015 to 1130 Patient completed first day of exercise and tolerated well with no complaints or concerns.     Dr. Fransico Him is Medical Director for Cardiac Rehab at [facility].

## 2021-05-22 ENCOUNTER — Encounter (HOSPITAL_COMMUNITY)
Admission: RE | Admit: 2021-05-22 | Discharge: 2021-05-22 | Disposition: A | Discharge status: Home / Self Care | Payer: PPO | Source: Ambulatory Visit | Attending: Internal Medicine | Admitting: Internal Medicine

## 2021-05-22 ENCOUNTER — Other Ambulatory Visit: Payer: Self-pay

## 2021-05-22 DIAGNOSIS — J449 Chronic obstructive pulmonary disease, unspecified: Secondary | ICD-10-CM

## 2021-05-22 NOTE — Progress Notes (Signed)
Daily Session Note  Patient Details  Name: Ryan Diaz MRN: [id number] Date of Birth: 08-Dec-1945 Referring Provider:   [name] Pulmonary Rehab Walk Test from 05/12/2021 in [place]  Referring Provider Dr. Annamaria Boots       Encounter Date: 05/22/2021  Check In:  Session Check In - 05/22/21 1153       Check-In   Supervising physician immediately available to respond to emergencies [facility] immediately available    Physician(s) Dr. Cruzita Lederer    [facility]    Staff Present Rosebud Poles, RN, BSN;Carlette Wilber Oliphant, RN, BSN;Ailea Rhatigan Ysidro Evert, RN;Jessica Hassell Done, MS, ACSM-CEP, Exercise Physiologist    Virtual Visit No    Medication changes reported     No    Fall or balance concerns reported    No    Tobacco Cessation No Change    Warm-up and Cool-down Performed as group-led instruction    Resistance Training Performed No    VAD Patient? No    PAD/SET Patient? No      Pain Assessment   Currently in Pain? No/denies    Multiple Pain Sites No             Capillary Blood Glucose: No results found for this or any previous visit (from the past 24 hour(s)).    Social History   Tobacco Use  Smoking Status Former   Packs/day: 1.50   Years: 35.00   Pack years: 52.50   Types: Cigarettes   Quit date: 09/04/2009   Years since quitting: 11.7  Smokeless Tobacco Never    Goals Met:  Exercise tolerated well No report of cardiac concerns or symptoms Strength training completed today  Goals Unmet:  Not Applicable   Comments: Service time is from 1030 to 1159    Dr. Fransico Him is Medical Director for Cardiac Rehab at [facility].

## 2021-05-27 ENCOUNTER — Encounter (HOSPITAL_COMMUNITY)
Admission: RE | Admit: 2021-05-27 | Discharge: 2021-05-27 | Disposition: A | Discharge status: Home / Self Care | Payer: PPO | Source: Ambulatory Visit | Attending: Internal Medicine | Admitting: Internal Medicine

## 2021-05-27 ENCOUNTER — Other Ambulatory Visit: Payer: Self-pay

## 2021-05-27 VITALS — Wt 169.5 lb

## 2021-05-27 DIAGNOSIS — J449 Chronic obstructive pulmonary disease, unspecified: Secondary | ICD-10-CM | POA: Diagnosis not present

## 2021-05-27 NOTE — Progress Notes (Signed)
Daily Session Note  Patient Details  Name: Ryan Diaz MRN: [id number] Date of Birth: 05-03-1946 Referring Provider:   [name] Pulmonary Rehab Walk Test from 05/12/2021 in [place]  Referring Provider Dr. Annamaria Boots       Encounter Date: 05/27/2021  Check In:  Session Check In - 05/27/21 1223       Check-In   Supervising physician immediately available to respond to emergencies [facility] immediately available    Physician(s) Dr. Cruzita Lederer    [facility]    Staff Present Rosebud Poles, RN, Isaac Laud, MS, ACSM-CEP, Exercise Physiologist;Lisa Ysidro Evert, RN    Virtual Visit No    Medication changes reported     No    Fall or balance concerns reported    No    Tobacco Cessation No Change    Warm-up and Cool-down Performed as group-led instruction    Resistance Training Performed Yes    VAD Patient? No    PAD/SET Patient? No      Pain Assessment   Currently in Pain? No/denies    Multiple Pain Sites No             Capillary Blood Glucose: No results found for this or any previous visit (from the past 24 hour(s)).   Exercise Prescription Changes - 05/27/21 1200       Response to Exercise   Blood Pressure (Admit) 114/66    Blood Pressure (Exercise) 124/68    Blood Pressure (Exit) 124/70    Heart Rate (Admit) 78 bpm    Heart Rate (Exercise) 104 bpm    Heart Rate (Exit) 87 bpm    Oxygen Saturation (Admit) 95 %    Oxygen Saturation (Exercise) 95 %    Oxygen Saturation (Exit) 97 %    Rating of Perceived Exertion (Exercise) 11    Perceived Dyspnea (Exercise) 1    Duration Continue with 30 min of aerobic exercise without signs/symptoms of physical distress.    Intensity Other (comment)   40-80% HR Max     Progression   Progression Continue to progress workloads to maintain intensity without signs/symptoms of physical distress.      Resistance Training   Training Prescription Yes    Weight Blue  bands    Reps 10-15    Time 10 Minutes      NuStep   Level 3    SPM 80    Minutes 15    METs 2.6      Track   Laps 22    Minutes 15    METs 3.5             Social History   Tobacco Use  Smoking Status Former   Packs/day: 1.50   Years: 35.00   Pack years: 52.50   Types: Cigarettes   Quit date: 09/04/2009   Years since quitting: 11.7  Smokeless Tobacco Never    Goals Met:  Proper associated with RPD/PD & O2 Sat Exercise tolerated well No report of cardiac concerns or symptoms Strength training completed today  Goals Unmet:  Not Applicable  Comments: Service time is from 1015 to 1135    Dr. Fransico Him is Medical Director for Cardiac Rehab at [facility].

## 2021-05-29 ENCOUNTER — Encounter (HOSPITAL_COMMUNITY)
Admission: RE | Admit: 2021-05-29 | Discharge: 2021-05-29 | Disposition: A | Discharge status: Home / Self Care | Payer: PPO | Source: Ambulatory Visit | Attending: Internal Medicine | Admitting: Internal Medicine

## 2021-05-29 ENCOUNTER — Other Ambulatory Visit: Payer: Self-pay

## 2021-05-29 DIAGNOSIS — J449 Chronic obstructive pulmonary disease, unspecified: Secondary | ICD-10-CM

## 2021-05-29 NOTE — Progress Notes (Signed)
Daily Session Note  Patient Details  Name: Ryan Diaz MRN: [id number] Date of Birth: 1946-09-29 Referring Provider:   [name] Pulmonary Rehab Walk Test from 05/12/2021 in [place]  Referring Provider Dr. Annamaria Boots       Encounter Date: 05/29/2021  Check In:  Session Check In - 05/29/21 1125       Check-In   Supervising physician immediately available to respond to emergencies [facility] immediately available    Physician(s) Dr. Kurtis Bushman    [facility]    Staff Present Esmeralda Links BS, ACSM EP-C, Exercise Physiologist;Lisa Jani Gravel, MS, ACSM-CEP, Exercise Physiologist    Virtual Visit No    Medication changes reported     No    Fall or balance concerns reported    No    Tobacco Cessation No Change    Warm-up and Cool-down Performed as group-led instruction    Resistance Training Performed Yes    VAD Patient? No    PAD/SET Patient? No      Pain Assessment   Multiple Pain Sites No             Capillary Blood Glucose: No results found for this or any previous visit (from the past 24 hour(s)).    Social History   Tobacco Use  Smoking Status Former   Packs/day: 1.50   Years: 35.00   Pack years: 52.50   Types: Cigarettes   Quit date: 09/04/2009   Years since quitting: 11.7  Smokeless Tobacco Never    Goals Met:  Proper associated with RPD/PD & O2 Sat Exercise tolerated well No report of cardiac concerns or symptoms Strength training completed today  Goals Unmet:  Not Applicable  Comments: Service time is from 1013 to 1135    Dr. Fransico Him is Medical Director for Cardiac Rehab at [facility].

## 2021-05-29 NOTE — Progress Notes (Addendum)
Ryan Diaz 75 y.o. male Nutrition Note  Diagnosis: COPD  Past Medical History:  Diagnosis Date   ABUSE, ALCOHOL, CONTINUOUS 08/08/2007   ALLERGIC RHINITIS 08/08/2007   ANXIETY 08/08/2007   Carpal tunnel syndrome    COPD 10/03/2009   DEPRESSION 08/08/2007   ERECTILE DYSFUNCTION 08/08/2007   GENITAL HERPES, HX OF 08/08/2007   GERD 08/08/2007   GLUCOSE INTOLERANCE 08/08/2007   HYPERLIPIDEMIA 08/08/2007   HYPERTENSION 08/08/2007   Impaired glucose tolerance 09/26/2011   Insomnia 05/18/2012   PAIN IN SOFT TISSUES OF LIMB 10/02/2009   Sleep apnea    OSA-uses CPAP nightly     Medications reviewed.   Current Outpatient Medications:    albuterol (PROVENTIL HFA;VENTOLIN HFA) 108 (90 BASE) MCG/ACT inhaler, Inhale 2 puffs into the lungs every 6 (six) hours as needed for wheezing or shortness of breath., Disp: , Rfl:    aspirin EC 81 MG tablet, Take 81 mg by mouth daily., Disp: , Rfl:    atorvastatin (LIPITOR) 80 MG tablet, Take 1 tablet (80 mg total) by mouth daily., Disp: 90 tablet, Rfl: 3   chlorhexidine (PERIDEX) 0.12 % solution, , Disp: , Rfl:    Cholecalciferol (VITAMIN D) 50 MCG (2000 UT) tablet, Take 2,000 Units by mouth daily., Disp: , Rfl:    clotrimazole (LOTRIMIN) 1 % cream, Apply 1 application topically 2 (two) times daily as needed (irritation)., Disp: , Rfl:    docusate sodium (COLACE) 100 MG capsule, Take 100 mg by mouth daily as needed for mild constipation., Disp: , Rfl:    donepezil (ARICEPT) 5 MG tablet, Take 1 tablet (5 mg total) by mouth at bedtime., Disp: 90 tablet, Rfl: 5   Ferrous Sulfate 27 MG TABS, Take 27 mg by mouth daily as needed (pt prefrence)., Disp: , Rfl:    fluocinonide ointment (LIDEX) 0.05 %, Apply 1 application topically 2 (two) times daily., Disp: 30 g, Rfl: 2   fluticasone (CUTIVATE) 0.05 % cream, Apply 1 application topically daily as needed (irritation)., Disp: , Rfl:    Fluticasone-Umeclidin-Vilant (TRELEGY ELLIPTA) 100-62.5-25 MCG/INH AEPB, Inhale 1  puff into the lungs daily., Disp: 60 each, Rfl: 11   Fluticasone-Umeclidin-Vilant (TRELEGY ELLIPTA) 200-62.5-25 MCG/INH AEPB, Inhale 1 puff into the lungs daily., Disp: 60 each, Rfl: 5   Fluticasone-Umeclidin-Vilant (TRELEGY ELLIPTA) 200-62.5-25 MCG/INH AEPB, Inhale 1 puff into the lungs daily., Disp: 60 each, Rfl: 0   gabapentin (NEURONTIN) 100 MG capsule, Take 1 capsule (100 mg total) by mouth 3 (three) times daily. (Patient taking differently: Take 100 mg by mouth 3 (three) times daily as needed (pain).), Disp: 90 capsule, Rfl: 5   ipratropium (ATROVENT) 0.06 % nasal spray, Place 2 sprays into both nostrils 3 (three) times daily., Disp: 15 mL, Rfl: 5   losartan (COZAAR) 50 MG tablet, Take 1 tablet (50 mg total) by mouth daily., Disp: 90 tablet, Rfl: 3   meloxicam (MOBIC) 15 MG tablet, 1 tab by mouth once daily as needed (Patient taking differently: Take 15 mg by mouth daily as needed for pain. 1 tab by mouth once daily as needed), Disp: 90 tablet, Rfl: 1   nitroGLYCERIN (NITROSTAT) 0.4 MG SL tablet, Place 1 tablet (0.4 mg total) under the tongue every 5 (five) minutes as needed for chest pain., Disp: 25 tablet, Rfl: 4   Omega-3 Fatty Acids (FISH OIL) 1000 MG CAPS, Take 1,000 mg by mouth 2 (two) times daily., Disp: , Rfl:    omeprazole (PRILOSEC) 20 MG capsule, Take 1 capsule (20 mg total) by  mouth 2 (two) times daily. (Patient taking differently: Take 20 mg by mouth 2 (two) times daily as needed (acid reflux).), Disp: 90 capsule, Rfl: 3   tadalafil (CIALIS) 20 MG tablet, Take 20 mg by mouth daily as needed for erectile dysfunction., Disp: , Rfl:    terazosin (HYTRIN) 2 MG capsule, Take 1 capsule (2 mg total) by mouth at bedtime., Disp: 90 capsule, Rfl: 3   traZODone (DESYREL) 50 MG tablet, Take 50 mg by mouth at bedtime as needed for sleep., Disp: , Rfl:    Ht Readings from Last 1 Encounters:  05/13/21 5\' 9"  (1.753 m)     Wt Readings from Last 3 Encounters:  05/27/21 169 lb 8.5 oz (76.9 kg)   05/13/21 168 lb (76.2 kg)  05/12/21 172 lb 9.9 oz (78.3 kg)     There is no height or weight on file to calculate BMI.   Social History   Tobacco Use  Smoking Status Former   Packs/day: 1.50   Years: 35.00   Pack years: 52.50   Types: Cigarettes   Quit date: 09/04/2009   Years since quitting: 11.7  Smokeless Tobacco Never      Nutrition Note  Spoke with pt. Nutrition Plan and Nutrition Survey goals reviewed with pt.   Vitamin D:  32.85 ng/ml on 05/13/21 He is taking his vitamin d supplement occasionally. Recommended he take it with meals.  No recent weight loss. BMI 25 kg/m2. He would like to gain a little weight. Most days he eats 1-2 meals and maybe a snack each day. Often it's only 1 meal per day. Some days he does not eat at all. Typically this occurs when he drinks beer first. He drinks more than 3 alcoholic beverage per day. He tries to drink a Boost most days.  He denies any exacerbations.  TEE 2300 kcals/day. He is not getting adequate calories each day based on diet recall.  Diet recall: Breakfast: canned chicken noodle soup OR [employer] sausage 1-2 pieces and instant grits Lunch: none if he has breakfast - if he does not eat breakfast he has a sandwich wit deli meat on [place] bread with mayo and cheese Dinner: 05/15/21 Calendar frozen meal  Boost most days.  He typically eats already prepared foods. He does not cook.    He thinks his diet could use improvement but he isn't sure if he is ready to make changes.  Pt expressed understanding of the information reviewed.    Nutrition Diagnosis  Inadequate oral intake related to alcohol abuse as evidenced by pt report of skipping meals, not eating at all after drinking beer, inability to gain weight  Nutrition Intervention Pt's individual nutrition plan reviewed with pt. Benefits of adopting healthy diet reviewed with Rate My Plate survey   Continue client-centered nutrition education by RD, as part of  interdisciplinary care.  Goal(s)  Pt to identify food quantities necessary to achieve weight gain of 3-5 lbs at graduation from pulmonary rehab.  Pt to build a healthy plate including vegetables, fruits, whole grains, and low-fat dairy products in a heart healthy meal plan. Pt to reduce/eliminate alcohol   Plan:  Will provide client-centered nutrition education as part of interdisciplinary care Monitor and evaluate progress toward nutrition goal with team.   Hilda Lias, MS, RDN, LDN

## 2021-06-03 ENCOUNTER — Encounter (HOSPITAL_COMMUNITY)
Admission: RE | Admit: 2021-06-03 | Discharge: 2021-06-03 | Disposition: A | Discharge status: Home / Self Care | Payer: PPO | Source: Ambulatory Visit | Attending: Internal Medicine | Admitting: Internal Medicine

## 2021-06-03 ENCOUNTER — Other Ambulatory Visit: Payer: Self-pay

## 2021-06-03 DIAGNOSIS — J449 Chronic obstructive pulmonary disease, unspecified: Secondary | ICD-10-CM | POA: Diagnosis not present

## 2021-06-03 NOTE — Progress Notes (Signed)
Pulmonary Individual Treatment Plan  Patient Details  Name: Ryan Diaz MRN: [id number] Date of Birth: 1945/12/31 Referring Provider:   Doristine Devoid Pulmonary Rehab Walk Test from 05/12/2021 in [facility] CARDIAC [facility]  Referring Provider Dr. Maple Hudson       Initial Encounter Date:  Flowsheet Row Pulmonary Rehab Walk Test from 05/12/2021 in [facility] CARDIAC REHAB  Date 05/12/21       Visit Diagnosis: Chronic obstructive pulmonary disease, unspecified COPD type (HCC)  Patient's Home Medications on Admission:   Current Outpatient Medications:    albuterol (PROVENTIL HFA;VENTOLIN HFA) 108 (90 BASE) MCG/ACT inhaler, Inhale 2 puffs into the lungs every 6 (six) hours as needed for wheezing or shortness of breath., Disp: , Rfl:    aspirin EC 81 MG tablet, Take 81 mg by mouth daily., Disp: , Rfl:    atorvastatin (LIPITOR) 80 MG tablet, Take 1 tablet (80 mg total) by mouth daily., Disp: 90 tablet, Rfl: 3   chlorhexidine (PERIDEX) 0.12 % solution, , Disp: , Rfl:    Cholecalciferol (VITAMIN D) 50 MCG (2000 UT) tablet, Take 2,000 Units by mouth daily., Disp: , Rfl:    clotrimazole (LOTRIMIN) 1 % cream, Apply 1 application topically 2 (two) times daily as needed (irritation)., Disp: , Rfl:    docusate sodium (COLACE) 100 MG capsule, Take 100 mg by mouth daily as needed for mild constipation., Disp: , Rfl:    donepezil (ARICEPT) 5 MG tablet, Take 1 tablet (5 mg total) by mouth at bedtime., Disp: 90 tablet, Rfl: 5   Ferrous Sulfate 27 MG TABS, Take 27 mg by mouth daily as needed (pt prefrence)., Disp: , Rfl:    fluocinonide ointment (LIDEX) 0.05 %, Apply 1 application topically 2 (two) times daily., Disp: 30 g, Rfl: 2   fluticasone (CUTIVATE) 0.05 % cream, Apply 1 application topically daily as needed (irritation)., Disp: , Rfl:    Fluticasone-Umeclidin-Vilant (TRELEGY ELLIPTA) 100-62.5-25 MCG/INH AEPB, Inhale 1 puff into the lungs daily., Disp: 60 each, Rfl:  11   Fluticasone-Umeclidin-Vilant (TRELEGY ELLIPTA) 200-62.5-25 MCG/INH AEPB, Inhale 1 puff into the lungs daily., Disp: 60 each, Rfl: 5   Fluticasone-Umeclidin-Vilant (TRELEGY ELLIPTA) 200-62.5-25 MCG/INH AEPB, Inhale 1 puff into the lungs daily., Disp: 60 each, Rfl: 0   gabapentin (NEURONTIN) 100 MG capsule, Take 1 capsule (100 mg total) by mouth 3 (three) times daily. (Patient taking differently: Take 100 mg by mouth 3 (three) times daily as needed (pain).), Disp: 90 capsule, Rfl: 5   ipratropium (ATROVENT) 0.06 % nasal spray, Place 2 sprays into both nostrils 3 (three) times daily., Disp: 15 mL, Rfl: 5   losartan (COZAAR) 50 MG tablet, Take 1 tablet (50 mg total) by mouth daily., Disp: 90 tablet, Rfl: 3   meloxicam (MOBIC) 15 MG tablet, 1 tab by mouth once daily as needed (Patient taking differently: Take 15 mg by mouth daily as needed for pain. 1 tab by mouth once daily as needed), Disp: 90 tablet, Rfl: 1   nitroGLYCERIN (NITROSTAT) 0.4 MG SL tablet, Place 1 tablet (0.4 mg total) under the tongue every 5 (five) minutes as needed for chest pain., Disp: 25 tablet, Rfl: 4   Omega-3 Fatty Acids (FISH OIL) 1000 MG CAPS, Take 1,000 mg by mouth 2 (two) times daily., Disp: , Rfl:    omeprazole (PRILOSEC) 20 MG capsule, Take 1 capsule (20 mg total) by mouth 2 (two) times daily. (Patient taking differently: Take 20 mg by mouth 2 (two) times daily as needed (  acid reflux).), Disp: 90 capsule, Rfl: 3   tadalafil (CIALIS) 20 MG tablet, Take 20 mg by mouth daily as needed for erectile dysfunction., Disp: , Rfl:    terazosin (HYTRIN) 2 MG capsule, Take 1 capsule (2 mg total) by mouth at bedtime., Disp: 90 capsule, Rfl: 3   traZODone (DESYREL) 50 MG tablet, Take 50 mg by mouth at bedtime as needed for sleep., Disp: , Rfl:   Past Medical History: Past Medical History:  Diagnosis Date   ABUSE, ALCOHOL, CONTINUOUS 08/08/2007   ALLERGIC RHINITIS 08/08/2007   ANXIETY 08/08/2007   Carpal tunnel syndrome    COPD  10/03/2009   DEPRESSION 08/08/2007   ERECTILE DYSFUNCTION 08/08/2007   GENITAL HERPES, HX OF 08/08/2007   GERD 08/08/2007   GLUCOSE INTOLERANCE 08/08/2007   HYPERLIPIDEMIA 08/08/2007   HYPERTENSION 08/08/2007   Impaired glucose tolerance 09/26/2011   Insomnia 05/18/2012   PAIN IN SOFT TISSUES OF LIMB 10/02/2009   Sleep apnea    OSA-uses CPAP nightly    Tobacco Use: Social History   Tobacco Use  Smoking Status Former   Packs/day: 1.50   Years: 35.00   Pack years: 52.50   Types: Cigarettes   Quit date: 09/04/2009   Years since quitting: 11.7  Smokeless Tobacco Never    Labs: Recent Review Advice worker     Labs for ITP Cardiac and Pulmonary Rehab Latest Ref Rng & Units 03/09/2018 10/19/2018 03/27/2019 03/27/2020 05/13/2021   Cholestrol 0 - 200 mg/dL 884 166 063 016 010   LDLCALC 0 - 99 mg/dL 54 - 65 79 -   LDLDIRECT mg/dL - 93.2 - - 35.5   HDL >73.22 mg/dL 02.54(Y) 70.62 37.62 83.15(V) 42.80   Trlycerides 0.0 - 149.0 mg/dL 761.0(H) 222.0(H) 114.0 155.0(H) 203.0(H)   Hemoglobin A1c 4.6 - 6.5 % 5.6 5.8 5.9 5.9 5.2       Capillary Blood Glucose: No results found for: GLUCAP   Pulmonary Assessment Scores:  Pulmonary Assessment Scores     Row Name 05/12/21 1153 05/12/21 1218       ADL UCSD   ADL Phase Entry Entry    SOB Score total 32 --         CAT Score      CAT Score 11 --         mMRC Score      mMRC Score -- 1           UCSD: Self-administered rating of dyspnea associated with activities of daily living (ADLs) 6-point scale (0 = "not at all" to 5 = "maximal or unable to do because of breathlessness")  Scoring Scores range from 0 to 120.  Minimally important difference is 5 units  CAT: CAT can identify the health impairment of COPD patients and is better correlated with disease progression.  CAT has a scoring range of zero to 40. The CAT score is classified into four groups of low (less than 10), medium (10 - 20), high (21-30) and very high (31-40) based on  the impact level of disease on health status. A CAT score over 10 suggests significant symptoms.  A worsening CAT score could be explained by an exacerbation, poor medication adherence, poor inhaler technique, or progression of COPD or comorbid conditions.  CAT MCID is 2 points  mMRC: mMRC (Modified Medical Research Council) Dyspnea Scale is used to assess the degree of baseline functional disability in patients of respiratory disease due to dyspnea. No minimal important difference is established. A decrease in score  of 1 point or greater is considered a positive change.   Pulmonary Function Assessment:   Exercise Target Goals: Exercise Program Goal: Individual exercise prescription set using results from initial 6 min walk test and THRR while considering  patient's activity barriers and safety.   Exercise Prescription Goal: Initial exercise prescription builds to 30-45 minutes a day of aerobic activity, 2-3 days per week.  Home exercise guidelines will be given to patient during program as part of exercise prescription that the participant will acknowledge.  Activity Barriers & Risk Stratification:  Activity Barriers & Cardiac Risk Stratification - 05/12/21 1127       Activity Barriers & Cardiac Risk Stratification   Activity Barriers Arthritis;Joint Problems;Deconditioning;Muscular Weakness   has a torn ligament in right shoulder, surgery is not recommended.  Mobic helps with the shoulder pain.            6 Minute Walk:  6 Minute Walk     Row Name 05/12/21 1218         6 Minute Walk   Phase Initial     Distance 1534 feet     Walk Time 6 minutes     # of Rest Breaks 0     MPH 2.91     METS 3.57     RPE 11     Perceived Dyspnea  1     VO2 Peak 12.51     Symptoms No     Resting HR 63 bpm     Resting BP 162/89     Resting Oxygen Saturation  96 %     Exercise Oxygen Saturation  during 6 min walk 94 %     Max Ex. HR 97 bpm     Max Ex. BP 188/91     2 Minute Post BP  182/94           Interval HR     1 Minute HR 93     2 Minute HR 95     3 Minute HR 97     4 Minute HR 97     5 Minute HR 93     6 Minute HR 94     2 Minute Post HR 64     Interval Heart Rate? Yes           Interval Oxygen     Interval Oxygen? Yes     Baseline Oxygen Saturation % 96 %     1 Minute Oxygen Saturation % 96 %     1 Minute Liters of Oxygen 0 L     2 Minute Oxygen Saturation % 96 %     2 Minute Liters of Oxygen 0 L     3 Minute Oxygen Saturation % 94 %     3 Minute Liters of Oxygen 0 L     4 Minute Oxygen Saturation % 96 %     4 Minute Liters of Oxygen 0 L     5 Minute Oxygen Saturation % 96 %     5 Minute Liters of Oxygen 0 L     6 Minute Oxygen Saturation % 97 %     6 Minute Liters of Oxygen 0 L     2 Minute Post Oxygen Saturation % 99 %     2 Minute Post Liters of Oxygen 0 L             Oxygen Initial Assessment:  Oxygen Initial Assessment - 05/12/21 1153  Home Oxygen   Home Oxygen Device None    Sleep Oxygen Prescription CPAP    Home Exercise Oxygen Prescription None    Home Resting Oxygen Prescription None    Compliance with Home Oxygen Use Yes      Initial 6 min Walk   Oxygen Used None      Program Oxygen Prescription   Program Oxygen Prescription None      Intervention   Short Term Goals To learn and exhibit compliance with exercise, home and travel O2 prescription;To learn and understand importance of monitoring SPO2 with pulse oximeter and demonstrate accurate use of the pulse oximeter.;To learn and understand importance of maintaining oxygen saturations>88%;To learn and demonstrate proper pursed lip breathing techniques or other breathing techniques. ;To learn and demonstrate proper use of respiratory medications    Long  Term Goals Exhibits compliance with exercise, home  and travel O2 prescription;Verbalizes importance of monitoring SPO2 with pulse oximeter and return demonstration;Maintenance of O2 saturations>88%;Exhibits proper  breathing techniques, such as pursed lip breathing or other method taught during program session;Compliance with respiratory medication;Demonstrates proper use of MDI's             Oxygen Re-Evaluation:  Oxygen Re-Evaluation     Row Name 06/02/21 1330             Program Oxygen Prescription   Program Oxygen Prescription None               Home Oxygen     Home Oxygen Device None       Sleep Oxygen Prescription CPAP       Home Exercise Oxygen Prescription None       Home Resting Oxygen Prescription None       Compliance with Home Oxygen Use Yes               Goals/Expected Outcomes     Short Term Goals To learn and exhibit compliance with exercise, home and travel O2 prescription;To learn and understand importance of monitoring SPO2 with pulse oximeter and demonstrate accurate use of the pulse oximeter.;To learn and understand importance of maintaining oxygen saturations>88%;To learn and demonstrate proper pursed lip breathing techniques or other breathing techniques. ;To learn and demonstrate proper use of respiratory medications       Long  Term Goals Exhibits compliance with exercise, home  and travel O2 prescription;Verbalizes importance of monitoring SPO2 with pulse oximeter and return demonstration;Maintenance of O2 saturations>88%;Exhibits proper breathing techniques, such as pursed lip breathing or other method taught during program session;Compliance with respiratory medication;Demonstrates proper use of MDI's       Goals/Expected Outcomes compliance and understanding of monitoring oxygen saturation and importance of pursed lip breathing.               Oxygen Discharge (Final Oxygen Re-Evaluation):  Oxygen Re-Evaluation - 06/02/21 1330       Program Oxygen Prescription   Program Oxygen Prescription None      Home Oxygen   Home Oxygen Device None    Sleep Oxygen Prescription CPAP    Home Exercise Oxygen Prescription None    Home Resting Oxygen Prescription None     Compliance with Home Oxygen Use Yes      Goals/Expected Outcomes   Short Term Goals To learn and exhibit compliance with exercise, home and travel O2 prescription;To learn and understand importance of monitoring SPO2 with pulse oximeter and demonstrate accurate use of the pulse oximeter.;To learn and understand importance of maintaining oxygen saturations>88%;To learn  and demonstrate proper pursed lip breathing techniques or other breathing techniques. ;To learn and demonstrate proper use of respiratory medications    Long  Term Goals Exhibits compliance with exercise, home  and travel O2 prescription;Verbalizes importance of monitoring SPO2 with pulse oximeter and return demonstration;Maintenance of O2 saturations>88%;Exhibits proper breathing techniques, such as pursed lip breathing or other method taught during program session;Compliance with respiratory medication;Demonstrates proper use of MDI's    Goals/Expected Outcomes compliance and understanding of monitoring oxygen saturation and importance of pursed lip breathing.             Initial Exercise Prescription:  Initial Exercise Prescription - 05/12/21 1200       Date of Initial Exercise RX and Referring Provider   Date 05/12/21    Referring Provider Dr. Maple Hudson    Expected Discharge Date 07/17/21      NuStep   Level 2    SPM 80    Minutes 15      Track   Minutes 16      Prescription Details   Frequency (times per week) 2    Duration Progress to 30 minutes of continuous aerobic without signs/symptoms of physical distress      Intensity   THRR 40-80% of Max Heartrate 58-116    Ratings of Perceived Exertion 11-13      Progression   Progression Continue to progress workloads to maintain intensity without signs/symptoms of physical distress.      Resistance Training   Training Prescription Yes    Weight Blue bands    Reps 10-15             Perform Capillary Blood Glucose checks as needed.  Exercise  Prescription Changes:   Exercise Prescription Changes     Row Name 05/27/21 1200             Response to Exercise   Blood Pressure (Admit) 114/66       Blood Pressure (Exercise) 124/68       Blood Pressure (Exit) 124/70       Heart Rate (Admit) 78 bpm       Heart Rate (Exercise) 104 bpm       Heart Rate (Exit) 87 bpm       Oxygen Saturation (Admit) 95 %       Oxygen Saturation (Exercise) 95 %       Oxygen Saturation (Exit) 97 %       Rating of Perceived Exertion (Exercise) 11       Perceived Dyspnea (Exercise) 1       Duration Continue with 30 min of aerobic exercise without signs/symptoms of physical distress.       Intensity Other (comment)  40-80% HR Max               Progression     Progression Continue to progress workloads to maintain intensity without signs/symptoms of physical distress.               Resistance Training     Training Prescription Yes       Weight Blue bands       Reps 10-15       Time 10 Minutes               NuStep     Level 3       SPM 80       Minutes 15       METs 2.6  Track     Laps 22       Minutes 15       METs 3.5               Exercise Comments:   Exercise Comments     Row Name 05/20/21 1150           Exercise Comments Pt completed first day of exercise and tolerated very well with no complaints or concerns. He walked 20 laps on the track in 15 minutes and also did 15 minutes on the Nustep with no issues. He does have some shoulder mobility issues and has some limitations with the resistance bands but tolerated well otherwise. Will continue to monitor.                Exercise Goals and Review:   Exercise Goals     Row Name 05/12/21 1217             Exercise Goals   Increase Physical Activity Yes       Intervention Provide advice, education, support and counseling about physical activity/exercise needs.;Develop an individualized exercise prescription for aerobic and resistive training based  on initial evaluation findings, risk stratification, comorbidities and participant's personal goals.       Expected Outcomes Short Term: Attend rehab on a regular basis to increase amount of physical activity.;Long Term: Add in home exercise to make exercise part of routine and to increase amount of physical activity.;Long Term: Exercising regularly at least 3-5 days a week.       Increase Strength and Stamina Yes       Intervention Provide advice, education, support and counseling about physical activity/exercise needs.;Develop an individualized exercise prescription for aerobic and resistive training based on initial evaluation findings, risk stratification, comorbidities and participant's personal goals.       Expected Outcomes Short Term: Increase workloads from initial exercise prescription for resistance, speed, and METs.;Short Term: Perform resistance training exercises routinely during rehab and add in resistance training at home;Long Term: Improve cardiorespiratory fitness, muscular endurance and strength as measured by increased METs and functional capacity ( )       Able to understand and use rate of perceived exertion (RPE) scale Yes       Intervention Provide education and explanation on how to use RPE scale       Expected Outcomes Short Term: Able to use RPE daily in rehab to express subjective intensity level;Long Term:  Able to use RPE to guide intensity level when exercising independently       Able to understand and use Dyspnea scale Yes       Intervention Provide education and explanation on how to use Dyspnea scale       Expected Outcomes Short Term: Able to use Dyspnea scale daily in rehab to express subjective sense of shortness of breath during exertion;Long Term: Able to use Dyspnea scale to guide intensity level when exercising independently       Knowledge and understanding of Target Heart Rate Range (THRR) Yes       Intervention Provide education and explanation of THRR  including how the numbers were predicted and where they are located for reference       Expected Outcomes Short Term: Able to state/look up THRR;Long Term: Able to use THRR to govern intensity when exercising independently;Short Term: Able to use daily as guideline for intensity in rehab       Understanding of Exercise Prescription Yes  Intervention Provide education, explanation, and written materials on patient's individual exercise prescription       Expected Outcomes Short Term: Able to explain program exercise prescription;Long Term: Able to explain home exercise prescription to exercise independently                Exercise Goals Re-Evaluation :  Exercise Goals Re-Evaluation     Row Name 06/02/21 1327             Exercise Goal Re-Evaluation   Exercise Goals Review Increase Physical Activity;Increase Strength and Stamina;Able to understand and use rate of perceived exertion (RPE) scale;Able to understand and use Dyspnea scale;Knowledge and understanding of Target Heart Rate Range (THRR);Understanding of Exercise Prescription       Comments Jonni SangerGurney has completed 4 exercise sessions and has tolerated very well so far. He has been averaging 20 laps on the Track and is exercising at 2.6 METS on the Nustep. He is also able to do the warm up/cool down and resistance exercises with no issues. Will continue to monitor and progress as he is able.       Expected Outcomes Through exercise at rehab and home, the patient will decrease shortness of breath with daily activities and feel confident carrying out an exercise regimn at home.                Discharge Exercise Prescription (Final Exercise Prescription Changes):  Exercise Prescription Changes - 05/27/21 1200       Response to Exercise   Blood Pressure (Admit) 114/66    Blood Pressure (Exercise) 124/68    Blood Pressure (Exit) 124/70    Heart Rate (Admit) 78 bpm    Heart Rate (Exercise) 104 bpm    Heart Rate (Exit) 87 bpm     Oxygen Saturation (Admit) 95 %    Oxygen Saturation (Exercise) 95 %    Oxygen Saturation (Exit) 97 %    Rating of Perceived Exertion (Exercise) 11    Perceived Dyspnea (Exercise) 1    Duration Continue with 30 min of aerobic exercise without signs/symptoms of physical distress.    Intensity Other (comment)   40-80% HR Max     Progression   Progression Continue to progress workloads to maintain intensity without signs/symptoms of physical distress.      Resistance Training   Training Prescription Yes    Weight Blue bands    Reps 10-15    Time 10 Minutes      NuStep   Level 3    SPM 80    Minutes 15    METs 2.6      Track   Laps 22    Minutes 15    METs 3.5             Nutrition:  Target Goals: Understanding of nutrition guidelines, daily intake of sodium 1500mg , cholesterol 200mg , calories 30% from fat and 7% or less from saturated fats, daily to have 5 or more servings of fruits and vegetables.  Biometrics:  Pre Biometrics - 05/12/21 1129       Pre Biometrics   Height 5\' 9"  (1.753 m)    Weight 78.3 kg    BMI (Calculated) 25.48    Grip Strength 24 kg              Nutrition Therapy Plan and Nutrition Goals:  Nutrition Therapy & Goals - 05/30/21 1030       Nutrition Therapy   Diet General Healthful  Personal Nutrition Goals   Nutrition Goal Pt to identify food quantities necessary to achieve weight gain of 3-5 lbs at graduation from pulmonary rehab.    Personal Goal #2 Pt to build a healthy plate including vegetables, fruits, whole grains, and low-fat dairy products in a heart healthy meal plan.    Personal Goal #3 Pt to reduce/eliminate alcohol      Intervention Plan   Intervention Prescribe, educate and counsel regarding individualized specific dietary modifications aiming towards targeted core components such as weight, hypertension, lipid management, diabetes, heart failure and other comorbidities.;Nutrition handout(s) given to patient.     Expected Outcomes Short Term Goal: A plan has been developed with personal nutrition goals set during dietitian appointment.;Short Term Goal: Understand basic principles of dietary content, such as calories, fat, sodium, cholesterol and nutrients.             Nutrition Assessments:  MEDIFICTS Score Key: ?70 Need to make dietary changes  40-70 Heart Healthy Diet ? 40 Therapeutic Level Cholesterol Diet  Flowsheet Row PULMONARY REHAB CHRONIC OBSTRUCTIVE PULMONARY DISEASE from 05/29/2021 in [facility] CARDIAC REHAB  Picture Your Plate Total Score on Admission 41      Picture Your Plate Scores: <16 Unhealthy dietary pattern with much room for improvement. 41-50 Dietary pattern unlikely to meet recommendations for good health and room for improvement. 51-60 More healthful dietary pattern, with some room for improvement.  >60 Healthy dietary pattern, although there may be some specific behaviors that could be improved.    Nutrition Goals Re-Evaluation:  Nutrition Goals Re-Evaluation     Row Name 05/30/21 1031             Goals   Current Weight 169 lb (76.7 kg)       Nutrition Goal Pt to identify food quantities necessary to achieve weight gain of 3-5 lbs at graduation from pulmonary rehab.               Personal Goal #2 Re-Evaluation     Personal Goal #2 Pt to build a healthy plate including vegetables, fruits, whole grains, and low-fat dairy products in a heart healthy meal plan.               Personal Goal #3 Re-Evaluation     Personal Goal #3 Pt to reduce/eliminate alcohol               Nutrition Goals Discharge (Final Nutrition Goals Re-Evaluation):  Nutrition Goals Re-Evaluation - 05/30/21 1031       Goals   Current Weight 169 lb (76.7 kg)    Nutrition Goal Pt to identify food quantities necessary to achieve weight gain of 3-5 lbs at graduation from pulmonary rehab.      Personal Goal #2 Re-Evaluation   Personal Goal #2 Pt to build a  healthy plate including vegetables, fruits, whole grains, and low-fat dairy products in a heart healthy meal plan.      Personal Goal #3 Re-Evaluation   Personal Goal #3 Pt to reduce/eliminate alcohol             Psychosocial: Target Goals: Acknowledge presence or absence of significant depression and/or stress, maximize coping skills, provide positive support system. Participant is able to verbalize types and ability to use techniques and skills needed for reducing stress and depression.  Initial Review & Psychosocial Screening:  Initial Psych Review & Screening - 05/12/21 1154       Initial Review   Current issues with Current Sleep  Concerns      Family Dynamics   Good Support System? Yes   son and a friend     Barriers   Psychosocial barriers to participate in program There are no identifiable barriers or psychosocial needs.      Screening Interventions   Interventions Encouraged to exercise    Expected Outcomes Long Term goal: The participant improves quality of Life and PHQ9 Scores as seen by post scores and/or verbalization of changes             Quality of Life Scores:  Scores of 19 and below usually indicate a poorer quality of life in these areas.  A difference of  2-3 points is a clinically meaningful difference.  A difference of 2-3 points in the total score of the Quality of Life Index has been associated with significant improvement in overall quality of life, self-image, physical symptoms, and general health in studies assessing change in quality of life.  PHQ-9: Recent Review Flowsheet Data     Depression screen [facility] 2/9 05/13/2021 05/12/2021 10/03/2020 07/26/2020 03/27/2020   Decreased Interest 0 0 0 0 0   Down, Depressed, Hopeless 0 0 0 0 0   PHQ - 2 Score 0 0 0 0 0   Altered sleeping - 3  - - -   Tired, decreased energy - 0 - - -   Change in appetite - 0 - - -   Feeling bad or failure about yourself  - 0 - - -   Trouble concentrating - 0 - - -    Moving slowly or fidgety/restless - 0 - - -   Suicidal thoughts - 0 - - -   PHQ-9 Score - 3 - - -   Difficult doing work/chores - Not difficult at all - - -      Interpretation of Total Score  Total Score Depression Severity:  1-4 = Minimal depression, 5-9 = Mild depression, 10-14 = Moderate depression, 15-19 = Moderately severe depression, 20-27 = Severe depression   Psychosocial Evaluation and Intervention:  Psychosocial Evaluation - 05/12/21 1155       Psychosocial Evaluation & Interventions   Interventions Encouraged to exercise with the program and follow exercise prescription    Comments no psychosocial concerns identified at this time    Expected Outcomes For patient to continue to be free of psychosocial concerns while participating in pulmonary rehab.    Continue Psychosocial Services  No Follow up required             Psychosocial Re-Evaluation:  Psychosocial Re-Evaluation     Row Name 06/02/21 1110             Psychosocial Re-Evaluation   Current issues with None Identified       Comments No psychosocial concerns identified at this time, Brynden has had trouble staying asleep for many years and does not attribute it to depression.He has a positive attitude when participating in pulmonary rehab, his son is very supportive and they are very close.       Expected Outcomes For Dailey to continue to have no psychosocial concerns while participating in pulmonary rehab.       Interventions Encouraged to attend Pulmonary Rehabilitation for the exercise       Continue Psychosocial Services  No Follow up required                Psychosocial Discharge (Final Psychosocial Re-Evaluation):  Psychosocial Re-Evaluation - 06/02/21 1110  Psychosocial Re-Evaluation   Current issues with None Identified    Comments No psychosocial concerns identified at this time, Izeyah has had trouble staying asleep for many years and does not attribute it to depression.He has a  positive attitude when participating in pulmonary rehab, his son is very supportive and they are very close.    Expected Outcomes For Hosteen to continue to have no psychosocial concerns while participating in pulmonary rehab.    Interventions Encouraged to attend Pulmonary Rehabilitation for the exercise    Continue Psychosocial Services  No Follow up required             Education: Education Goals: Education classes will be provided on a weekly basis, covering required topics. Participant will state understanding/return demonstration of topics presented.  Learning Barriers/Preferences:  Learning Barriers/Preferences - 05/12/21 1258       Learning Barriers/Preferences   Learning Barriers None    Learning Preferences Audio;Computer/Internet;Group Instruction;Individual Instruction;Pictoral;Skilled Demonstration;Written Material             Education Topics: Risk Factor Reduction:  -Group instruction that is supported by a PowerPoint presentation. Instructor discusses the definition of a risk factor, different risk factors for pulmonary disease, and how the heart and lungs work together.     Nutrition for Pulmonary Patient:  -Group instruction provided by PowerPoint slides, verbal discussion, and written materials to support subject matter. The instructor gives an explanation and review of healthy diet recommendations, which includes a discussion on weight management, recommendations for fruit and vegetable consumption, as well as protein, fluid, caffeine, fiber, sodium, sugar, and alcohol. Tips for eating when patients are short of breath are discussed.   Pursed Lip Breathing:  -Group instruction that is supported by demonstration and informational handouts. Instructor discusses the benefits of pursed lip and diaphragmatic breathing and detailed demonstration on how to preform both.     Oxygen Safety:  -Group instruction provided by PowerPoint, verbal discussion, and written  material to support subject matter. There is an overview of "What is Oxygen" and "Why do we need it".  Instructor also reviews how to create a safe environment for oxygen use, the importance of using oxygen as prescribed, and the risks of noncompliance. There is a brief discussion on traveling with oxygen and resources the patient may utilize.   Oxygen Equipment:  -Group instruction provided by [facility] Staff utilizing handouts, written materials, and equipment demonstrations.   Signs and Symptoms:  -Group instruction provided by written material and verbal discussion to support subject matter. Warning signs and symptoms of infection, stroke, and heart attack are reviewed and when to call the physician/911 reinforced. Tips for preventing the spread of infection discussed.   Advanced Directives:  -Group instruction provided by verbal instruction and written material to support subject matter. Instructor reviews Advanced Directive laws and proper instruction for filling out document.   Pulmonary Video:  -Group video education that reviews the importance of medication and oxygen compliance, exercise, good nutrition, pulmonary hygiene, and pursed lip and diaphragmatic breathing for the pulmonary patient.   Exercise for the Pulmonary Patient:  -Group instruction that is supported by a PowerPoint presentation. Instructor discusses benefits of exercise, core components of exercise, frequency, duration, and intensity of an exercise routine, importance of utilizing pulse oximetry during exercise, safety while exercising, and options of places to exercise outside of rehab.   Flowsheet Row PULMONARY REHAB CHRONIC OBSTRUCTIVE PULMONARY DISEASE from 05/29/2021 in [facility] CARDIAC REHAB  Date 05/29/21  Educator Jess-Handout  Instruction Review Code 1- Verbalizes Understanding       Pulmonary Medications:  -Verbally interactive group education provided by instructor with focus on  inhaled medications and proper administration.   Anatomy and Physiology of the Respiratory System and Intimacy:  -Group instruction provided by PowerPoint, verbal discussion, and written material to support subject matter. Instructor reviews respiratory cycle and anatomical components of the respiratory system and their functions. Instructor also reviews differences in obstructive and restrictive respiratory diseases with examples of each. Intimacy, Sex, and Sexuality differences are reviewed with a discussion on how relationships can change when diagnosed with pulmonary disease. Common sexual concerns are reviewed.   MD DAY -A group question and answer session with a medical doctor that allows participants to ask questions that relate to their pulmonary disease state.   OTHER EDUCATION -Group or individual verbal, written, or video instructions that support the educational goals of the pulmonary rehab program. Flowsheet Row PULMONARY REHAB CHRONIC OBSTRUCTIVE PULMONARY DISEASE from 05/29/2021 in [facility] CARDIAC REHAB  Date 05/22/21  Educator Handout  [Know your numbers]       Holiday Eating Survival Tips:  -Group instruction provided by PowerPoint slides, verbal discussion, and written materials to support subject matter. The instructor gives patients tips, tricks, and techniques to help them not only survive but enjoy the holidays despite the onslaught of food that accompanies the holidays.   Knowledge Questionnaire Score:  Knowledge Questionnaire Score - 05/12/21 1154       Knowledge Questionnaire Score   Pre Score 13/18             Core Components/Risk Factors/Patient Goals at Admission:  Personal Goals and Risk Factors at Admission - 05/12/21 1157       Core Components/Risk Factors/Patient Goals on Admission   Improve shortness of breath with ADL's Yes    Intervention Provide education, individualized exercise plan and daily activity instruction to  help decrease symptoms of SOB with activities of daily living.    Expected Outcomes Short Term: Improve cardiorespiratory fitness to achieve a reduction of symptoms when performing ADLs;Long Term: Be able to perform more ADLs without symptoms or delay the onset of symptoms             Core Components/Risk Factors/Patient Goals Review:   Goals and Risk Factor Review     Row Name 05/12/21 1157 06/02/21 1113           Core Components/Risk Factors/Patient Goals Review   Personal Goals Review Develop more efficient breathing techniques such as purse lipped breathing and diaphragmatic breathing and practicing self-pacing with activity.;Increase knowledge of respiratory medications and ability to use respiratory devices properly.;Improve shortness of breath with ADL's Develop more efficient breathing techniques such as purse lipped breathing and diaphragmatic breathing and practicing self-pacing with activity.;Increase knowledge of respiratory medications and ability to use respiratory devices properly.;Improve shortness of breath with ADL's      Review -- Juanangel is a high functioning COPD patient, he is able exercise without supplemental oxygen @ 2.5 mets on the nustep and walking almost a mile on the track in 15 minutes.  He is progressing well, and seems to be enjoying pulmonary rehab.      Expected Outcomes -- See admission goals.               Core Components/Risk Factors/Patient Goals at Discharge (Final Review):   Goals and Risk Factor Review - 06/02/21 1113       Core Components/Risk Factors/Patient Goals Review  Personal Goals Review Develop more efficient breathing techniques such as purse lipped breathing and diaphragmatic breathing and practicing self-pacing with activity.;Increase knowledge of respiratory medications and ability to use respiratory devices properly.;Improve shortness of breath with ADL's    Review Barth is a high functioning COPD patient, he is able exercise  without supplemental oxygen @ 2.5 mets on the nustep and walking almost a mile on the track in 15 minutes.  He is progressing well, and seems to be enjoying pulmonary rehab.    Expected Outcomes See admission goals.             ITP Comments:   Comments:  Callaghan has completed 4 exercise session in Pulmonary rehab. Pt maintains good attendance. Pulmonary rehab staff will continue to monitor and reassess progress toward goals during his participation in Pulmonary Rehab.

## 2021-06-03 NOTE — Progress Notes (Signed)
Daily Session Note  Patient Details  Name: Ryan Diaz MRN: [id number] Date of Birth: [birth year] Referring Provider:   [name] Pulmonary Rehab Walk Test from 05/12/2021 in [place]  Referring Provider Dr. Annamaria Boots       Encounter Date: 06/03/2021  Check In:  Session Check In - 06/03/21 1146       Check-In   Supervising physician immediately available to respond to emergencies [facility] immediately available    Physician(s) Dr. Sabino Gasser    Location [facility]    Staff Present Rosebud Poles, RN, Isaac Laud, MS, ACSM-CEP, Exercise Physiologist;Katori Wirsing Ysidro Evert, RN    Virtual Visit No    Medication changes reported     No    Fall or balance concerns reported    No    Tobacco Cessation No Change    Warm-up and Cool-down Performed as group-led instruction    Resistance Training Performed Yes    VAD Patient? No    PAD/SET Patient? No      Pain Assessment   Currently in Pain? No/denies    Multiple Pain Sites No             Capillary Blood Glucose: No results found for this or any previous visit (from the past 24 hour(s)).    Social History   Tobacco Use  Smoking Status Former   Packs/day: 1.50   Years: 35.00   Pack years: 52.50   Types: Cigarettes   Quit date: 09/04/2009   Years since quitting: 11.7  Smokeless Tobacco Never    Goals Met:  Exercise tolerated well No report of cardiac concerns or symptoms Strength training completed today  Goals Unmet:  Not Applicable  Comments: Service time is from 1015 to 1135    Dr. Fransico Him is Medical Director for Cardiac Rehab at [facility].

## 2021-06-05 ENCOUNTER — Encounter (HOSPITAL_COMMUNITY)
Admission: RE | Admit: 2021-06-05 | Discharge: 2021-06-05 | Disposition: A | Discharge status: Home / Self Care | Payer: PPO | Source: Ambulatory Visit | Attending: Internal Medicine | Admitting: Internal Medicine

## 2021-06-05 ENCOUNTER — Other Ambulatory Visit: Payer: Self-pay

## 2021-06-05 DIAGNOSIS — J449 Chronic obstructive pulmonary disease, unspecified: Secondary | ICD-10-CM

## 2021-06-05 NOTE — Progress Notes (Signed)
Daily Session Note  Patient Details  Name: Ryan Diaz MRN: [id number] Date of Birth: Mar 01, 1946 Referring Provider:   [name] Pulmonary Rehab Walk Test from 05/12/2021 in [place]  Referring Provider Dr. Annamaria Boots       Encounter Date: 06/05/2021  Check In:  Session Check In - 06/05/21 1152       Check-In   Supervising physician immediately available to respond to emergencies [facility] immediately available    Physician(s) Dr. Adalberto Ill    Location [facility]    Staff Present Rosebud Poles, RN, Isaac Laud, MS, ACSM-CEP, Exercise Physiologist;Joycie Aerts Clarisa Schools, MS, ACSM-CEP, Exercise Physiologist    Virtual Visit No    Medication changes reported     No    Fall or balance concerns reported    No    Tobacco Cessation No Change    Warm-up and Cool-down Performed as group-led instruction    Resistance Training Performed Yes    VAD Patient? No    PAD/SET Patient? No      Pain Assessment   Currently in Pain? No/denies    Multiple Pain Sites No             Capillary Blood Glucose: No results found for this or any previous visit (from the past 24 hour(s)).    Social History   Tobacco Use  Smoking Status Former   Packs/day: 1.50   Years: 35.00   Pack years: 52.50   Types: Cigarettes   Quit date: 09/04/2009   Years since quitting: 11.7  Smokeless Tobacco Never    Goals Met:  Exercise tolerated well No report of cardiac concerns or symptoms Strength training completed today  Goals Unmet:  Not Applicable  Comments: Service time is from 1020 to 1135    Dr. Fransico Him is Medical Director for Cardiac Rehab at [facility].

## 2021-06-10 ENCOUNTER — Encounter (HOSPITAL_COMMUNITY)
Admission: RE | Admit: 2021-06-10 | Discharge: 2021-06-10 | Disposition: A | Discharge status: Home / Self Care | Payer: PPO | Source: Ambulatory Visit | Attending: Internal Medicine | Admitting: Internal Medicine

## 2021-06-10 ENCOUNTER — Other Ambulatory Visit: Payer: Self-pay

## 2021-06-10 DIAGNOSIS — J449 Chronic obstructive pulmonary disease, unspecified: Secondary | ICD-10-CM

## 2021-06-10 NOTE — Progress Notes (Signed)
Daily Session Note  Patient Details  Name: Ryan Diaz MRN: [id number] Date of Birth: 01-Jan-1946 Referring Provider:   [name] Pulmonary Rehab Walk Test from 05/12/2021 in [place]  Referring Provider Dr. Annamaria Boots       Encounter Date: 06/10/2021  Check In:  Session Check In - 06/10/21 1104       Check-In   Supervising physician immediately available to respond to emergencies [facility] immediately available    Physician(s) Dr. Sabino Gasser    Location [facility]    Staff Present Rosebud Poles, RN, Quentin Ore, MS, ACSM-CEP, Exercise Physiologist;Lisa Ysidro Evert, RN;Terence Googe Hassell Done, MS, ACSM-CEP, Exercise Physiologist    Virtual Visit No    Medication changes reported     No    Fall or balance concerns reported    No    Tobacco Cessation No Change    Warm-up and Cool-down Performed as group-led instruction    Resistance Training Performed Yes    VAD Patient? No    PAD/SET Patient? No      Pain Assessment   Currently in Pain? No/denies    Multiple Pain Sites No             Capillary Blood Glucose: No results found for this or any previous visit (from the past 24 hour(s)).   Exercise Prescription Changes - 06/10/21 1100       Response to Exercise   Blood Pressure (Admit) 112/62    Blood Pressure (Exercise) 112/70    Blood Pressure (Exit) 104/60    Heart Rate (Admit) 92 bpm    Heart Rate (Exercise) 112 bpm    Heart Rate (Exit) 93 bpm    Oxygen Saturation (Admit) 95 %    Oxygen Saturation (Exercise) 94 %    Oxygen Saturation (Exit) 94 %    Rating of Perceived Exertion (Exercise) 13    Perceived Dyspnea (Exercise) 2    Duration Continue with 30 min of aerobic exercise without signs/symptoms of physical distress.    Intensity THRR unchanged      Progression   Progression Continue to progress workloads to maintain intensity without signs/symptoms of physical distress.      Resistance Training   Training  Prescription Yes    Weight Blue bands    Reps 10-15    Time 10 Minutes      NuStep   Level 3    SPM 80    Minutes 15    METs 2.4      Track   Laps 18    Minutes 15    METs 3.09             Social History   Tobacco Use  Smoking Status Former   Packs/day: 1.50   Years: 35.00   Pack years: 52.50   Types: Cigarettes   Quit date: 09/04/2009   Years since quitting: 11.7  Smokeless Tobacco Never    Goals Met:  Proper associated with RPD/PD & O2 Sat Independence with exercise equipment Exercise tolerated well No report of cardiac concerns or symptoms Strength training completed today  Goals Unmet:  Not Applicable  Comments: Service time is from 1021 to 1135    Dr. Fransico Him is Medical Director for Cardiac Rehab at [facility].

## 2021-06-12 ENCOUNTER — Other Ambulatory Visit: Payer: Self-pay

## 2021-06-12 ENCOUNTER — Encounter (HOSPITAL_COMMUNITY)
Admission: RE | Admit: 2021-06-12 | Discharge: 2021-06-12 | Disposition: A | Discharge status: Home / Self Care | Payer: PPO | Source: Ambulatory Visit | Attending: Internal Medicine | Admitting: Internal Medicine

## 2021-06-12 DIAGNOSIS — J449 Chronic obstructive pulmonary disease, unspecified: Secondary | ICD-10-CM

## 2021-06-12 NOTE — Progress Notes (Signed)
Daily Session Note  Patient Details  Name: Ryan Diaz MRN: [id number] Date of Birth: 07/24/1946 Referring Provider:   Flowsheet Row Pulmonary Rehab Walk Test from 05/12/2021 in [facility]  Referring Provider Dr. Young       Encounter Date: 06/12/2021  Check In:  Session Check In - 06/12/21 1106       Check-In   Supervising physician immediately available to respond to emergencies [facility] immediately available    Physician(s) Dr. Akula    Location [facility]    Staff Present Joan Behrens, RN, BSN; , MS, ACSM-CEP, Exercise Physiologist;Jessica Martin, MS, ACSM-CEP, Exercise Physiologist;Lisa Hughes, RN    Virtual Visit No    Medication changes reported     No    Fall or balance concerns reported    No    Tobacco Cessation No Change    Warm-up and Cool-down Performed as group-led instruction    Resistance Training Performed Yes    VAD Patient? No    PAD/SET Patient? No      Pain Assessment   Currently in Pain? No/denies    Multiple Pain Sites No             Capillary Blood Glucose: No results found for this or any previous visit (from the past 24 hour(s)).    Social History   Tobacco Use  Smoking Status Former   Packs/day: 1.50   Years: 35.00   Pack years: 52.50   Types: Cigarettes   Quit date: 09/04/2009   Years since quitting: 11.7  Smokeless Tobacco Never    Goals Met:  Proper associated with RPD/PD & O2 Sat Exercise tolerated well No report of cardiac concerns or symptoms Strength training completed today  Goals Unmet:  Not Applicable  Comments: Service time is from 1015 to 1135    Dr. Traci Turner is Medical Director for Cardiac Rehab at [facility]. 

## 2021-06-13 ENCOUNTER — Ambulatory Visit (INDEPENDENT_AMBULATORY_CARE_PROVIDER_SITE_OTHER): Payer: PPO | Admitting: Internal Medicine

## 2021-06-13 VITALS — BP 142/80 | HR 94 | Temp 98.1°F | Ht 69.0 in | Wt 169.0 lb

## 2021-06-13 DIAGNOSIS — I2510 Atherosclerotic heart disease of native coronary artery without angina pectoris: Secondary | ICD-10-CM | POA: Diagnosis not present

## 2021-06-13 DIAGNOSIS — I10 Essential (primary) hypertension: Secondary | ICD-10-CM

## 2021-06-13 DIAGNOSIS — R7302 Impaired glucose tolerance (oral): Secondary | ICD-10-CM

## 2021-06-13 DIAGNOSIS — E7800 Pure hypercholesterolemia, unspecified: Secondary | ICD-10-CM

## 2021-06-13 DIAGNOSIS — E559 Vitamin D deficiency, unspecified: Secondary | ICD-10-CM

## 2021-06-13 MED ORDER — ATORVASTATIN CALCIUM 80 MG PO TABS
80.0000 mg | ORAL_TABLET | Freq: Every day | ORAL | 3 refills | Status: DC
Start: 2021-06-13 — End: 2022-07-13

## 2021-06-13 NOTE — Patient Instructions (Signed)
Ok to increase the lipitor to 80 mg per day  Please continue all other medications as before, and refills have been done if requested.  Please have the pharmacy call with any other refills you may need.  Please continue your efforts at being more active, low cholesterol diet, and weight control.  Please keep your appointments with your specialists as you may have planned  Please make an Appointment to return in 6 months, or sooner if needed

## 2021-06-13 NOTE — Progress Notes (Signed)
Chief Complaint: follow up HTN, HLD and hyperglycemia       HPI:  Ryan Diaz is a 75 y.o. male here overall doing ok, for some reason only taking lipitor 40 mg, goal ldl is < 70.  Trying to follow lower cholesterol diet.  BP at home has been < 140/90 on new losartan 50 mg and o/w tolerating well.  Pt denies chest pain, increased sob or doe, wheezing, orthopnea, PND, increased LE swelling, palpitations, dizziness or syncope.   Pt denies polydipsia, polyuria, or new focal neuro s/s.   Pt denies fever, wt loss, night sweats, loss of appetite, or other constitutional symptoms Not taking Vit D       Wt Readings from Last 3 Encounters:  06/13/21 169 lb (76.7 kg)  05/27/21 169 lb 8.5 oz (76.9 kg)  05/13/21 168 lb (76.2 kg)   BP Readings from Last 3 Encounters:  06/13/21 (!) 142/80  05/13/21 (!) 162/86  05/12/21 (!) 156/86         Past Medical History:  Diagnosis Date   ABUSE, ALCOHOL, CONTINUOUS 08/08/2007   ALLERGIC RHINITIS 08/08/2007   ANXIETY 08/08/2007   Carpal tunnel syndrome    COPD 10/03/2009   DEPRESSION 08/08/2007   ERECTILE DYSFUNCTION 08/08/2007   GENITAL HERPES, HX OF 08/08/2007   GERD 08/08/2007   GLUCOSE INTOLERANCE 08/08/2007   HYPERLIPIDEMIA 08/08/2007   HYPERTENSION 08/08/2007   Impaired glucose tolerance 09/26/2011   Insomnia 05/18/2012   PAIN IN SOFT TISSUES OF LIMB 10/02/2009   Sleep apnea    OSA-uses CPAP nightly   Past Surgical History:  Procedure Laterality Date   ACHILLES TENDON REPAIR Bilateral    CARPAL TUNNEL RELEASE Right 08/18/2018   Procedure: RIGHT CARPAL TUNNEL RELEASE;  Surgeon: Betha Loa, MD;  Location: [facility];  Service: Orthopedics;  Laterality: Right;   CARPAL TUNNEL RELEASE Left 11/21/2018   Procedure: LEFT CARPAL TUNNEL RELEASE;  Surgeon: Betha Loa, MD;  Location: [facility];  Service: Orthopedics;  Laterality: Left;  Bier block   COLONOSCOPY     LEFT HEART CATH AND CORONARY ANGIOGRAPHY N/A 10/17/2020    Procedure: LEFT HEART CATH AND CORONARY ANGIOGRAPHY;  Surgeon: Yvonne Kendall, MD;  [facility];  Service: Cardiovascular;  Laterality: N/A;    reports that he quit smoking about 11 years ago. His smoking use included cigarettes. He has a 52.50 pack-year smoking history. He has never used smokeless tobacco. He reports current alcohol use of about 46.0 - 53.0 standard drinks of alcohol per week. He reports current drug use. Drug: Marijuana. family history includes Colon polyps in his brother. Allergies  Allergen Reactions   Sildenafil Palpitations   Current Outpatient Medications on File Prior to Visit  Medication Sig Dispense Refill   albuterol (PROVENTIL HFA;VENTOLIN HFA) 108 (90 BASE) MCG/ACT inhaler Inhale 2 puffs into the lungs every 6 (six) hours as needed for wheezing or shortness of breath.     aspirin EC 81 MG tablet Take 81 mg by mouth daily.     chlorhexidine (PERIDEX) 0.12 % solution      Cholecalciferol (VITAMIN D) 50 MCG (2000 UT) tablet Take 2,000 Units by mouth daily.     clotrimazole (LOTRIMIN) 1 % cream Apply 1 application topically 2 (two) times daily as needed (irritation).     docusate sodium (COLACE) 100 MG capsule Take 100 mg by mouth daily as needed for mild constipation.     donepezil (ARICEPT) 5  MG tablet Take 1 tablet (5 mg total) by mouth at bedtime. 90 tablet 5   Ferrous Sulfate 27 MG TABS Take 27 mg by mouth daily as needed (pt prefrence).     fluocinonide ointment (LIDEX) 0.05 % Apply 1 application topically 2 (two) times daily. 30 g 2   fluticasone (CUTIVATE) 0.05 % cream Apply 1 application topically daily as needed (irritation).     Fluticasone-Umeclidin-Vilant (TRELEGY ELLIPTA) 100-62.5-25 MCG/INH AEPB Inhale 1 puff into the lungs daily. 60 each 11   Fluticasone-Umeclidin-Vilant (TRELEGY ELLIPTA) 200-62.5-25 MCG/INH AEPB Inhale 1 puff into the lungs daily. 60 each 5   Fluticasone-Umeclidin-Vilant (TRELEGY ELLIPTA) 200-62.5-25 MCG/INH AEPB  Inhale 1 puff into the lungs daily. 60 each 0   gabapentin (NEURONTIN) 100 MG capsule Take 1 capsule (100 mg total) by mouth 3 (three) times daily. (Patient taking differently: Take 100 mg by mouth 3 (three) times daily as needed (pain).) 90 capsule 5   ipratropium (ATROVENT) 0.06 % nasal spray Place 2 sprays into both nostrils 3 (three) times daily. 15 mL 5   losartan (COZAAR) 50 MG tablet Take 1 tablet (50 mg total) by mouth daily. 90 tablet 3   meloxicam (MOBIC) 15 MG tablet 1 tab by mouth once daily as needed (Patient taking differently: Take 15 mg by mouth daily as needed for pain. 1 tab by mouth once daily as needed) 90 tablet 1   nitroGLYCERIN (NITROSTAT) 0.4 MG SL tablet Place 1 tablet (0.4 mg total) under the tongue every 5 (five) minutes as needed for chest pain. 25 tablet 4   Omega-3 Fatty Acids (FISH OIL) 1000 MG CAPS Take 1,000 mg by mouth 2 (two) times daily.     omeprazole (PRILOSEC) 20 MG capsule Take 1 capsule (20 mg total) by mouth 2 (two) times daily. (Patient taking differently: Take 20 mg by mouth 2 (two) times daily as needed (acid reflux).) 90 capsule 3   tadalafil (CIALIS) 20 MG tablet Take 20 mg by mouth daily as needed for erectile dysfunction.     terazosin (HYTRIN) 2 MG capsule Take 1 capsule (2 mg total) by mouth at bedtime. 90 capsule 3   traZODone (DESYREL) 50 MG tablet Take 50 mg by mouth at bedtime as needed for sleep.     No current facility-administered medications on file prior to visit.        ROS:  All others reviewed and negative.  Objective        PE:  BP (!) 142/80   Pulse 94   Temp 98.1 F (36.7 C) (Oral)   Ht 5\' 9"  (1.753 m)   Wt 169 lb (76.7 kg)   SpO2 98%   BMI 24.96 kg/m                 Constitutional: Pt appears in NAD               HENT: Head: NCAT.                Right Ear: External ear normal.                 Left Ear: External ear normal.                Eyes: . Pupils are equal, round, and reactive to light. Conjunctivae and EOM are  normal               Nose: without d/c or deformity  Neck: Neck supple. Gross normal ROM               Cardiovascular: Normal rate and regular rhythm.                 Pulmonary/Chest: Effort normal and breath sounds without rales or wheezing.                Abd:  Soft, NT, ND, + BS, no organomegaly               Neurological: Pt is alert. At baseline orientation, motor grossly intact               Skin: Skin is warm. No rashes, no other new lesions, LE edema - none               Psychiatric: Pt behavior is normal without agitation   Micro: none  Cardiac tracings I have personally interpreted today:  none  Pertinent Radiological findings (summarize): none   Lab Results  Component Value Date   WBC 6.0 05/13/2021   HGB 15.4 05/13/2021   HCT 45.5 05/13/2021   PLT 332.0 05/13/2021   GLUCOSE 84 05/13/2021   CHOL 165 05/13/2021   TRIG 203.0 (H) 05/13/2021   HDL 42.80 05/13/2021   LDLDIRECT 96.0 05/13/2021   LDLCALC 79 03/27/2020   ALT 27 05/13/2021   AST 23 05/13/2021   NA 138 05/13/2021   K 4.4 05/13/2021   CL 103 05/13/2021   CREATININE 1.20 05/13/2021   BUN 14 05/13/2021   CO2 25 05/13/2021   TSH 0.84 05/13/2021   PSA 1.00 05/13/2021   HGBA1C 5.2 05/13/2021   Assessment/Plan:  Ryan Diaz is a 76 y.o. Black or African American [2] male with  has a past medical history of ABUSE, ALCOHOL, CONTINUOUS (08/08/2007), ALLERGIC RHINITIS (08/08/2007), ANXIETY (08/08/2007), Carpal tunnel syndrome, COPD (10/03/2009), DEPRESSION (08/08/2007), ERECTILE DYSFUNCTION (08/08/2007), GENITAL HERPES, HX OF (08/08/2007), GERD (08/08/2007), GLUCOSE INTOLERANCE (08/08/2007), HYPERLIPIDEMIA (08/08/2007), HYPERTENSION (08/08/2007), Impaired glucose tolerance (09/26/2011), Insomnia (05/18/2012), PAIN IN SOFT TISSUES OF LIMB (10/02/2009), and Sleep apnea.  Vitamin D deficiency Last vitamin D Lab Results  Component Value Date   VD25OH 32.85 05/13/2021   Low normal, to start oral replacement - D3  at 2000 u qd  HLD (hyperlipidemia) Lab Results  Component Value Date   LDLCALC 79 03/27/2020   Uncontrolled, goal < 70, pt only taking lipitor 40 mg from the [place],  pt to increase lipitor 80qd in light of recent finding mild to mod CAD by cath 2021   Coronary artery disease Asympt, currently stable,  Cont same tx, to f/u any worsening symptoms or concerns  Impaired glucose tolerance Lab Results  Component Value Date   HGBA1C 5.2 05/13/2021   Stable, pt to continue current medical treatment  - diet and wt control   Essential hypertension BP Readings from Last 3 Encounters:  06/13/21 (!) 142/80  05/13/21 (!) 162/86  05/12/21 (!) 156/86   Uncontrolled here,pt states BP controlled < 140/90 at home, pt to continue medical treatment losartan 50  Followup: Return in about 6 months (around 12/14/2021).  Oliver Barre, MD 06/14/2021 2:45 PM [facility]

## 2021-06-13 NOTE — Assessment & Plan Note (Signed)
Lab Results  Component Value Date   LDLCALC 79 03/27/2020   Uncontrolled, goal < 70, pt only taking lipitor 40 mg from the [place],  pt to increase lipitor 80qd in light of recent finding mild to mod CAD by cath 2021

## 2021-06-13 NOTE — Assessment & Plan Note (Signed)
Last vitamin D Lab Results  Component Value Date   VD25OH 32.85 05/13/2021   Low normal, to start oral replacement - D3 at 2000 u qd

## 2021-06-14 ENCOUNTER — Encounter: Payer: Self-pay | Admitting: Internal Medicine

## 2021-06-14 NOTE — Assessment & Plan Note (Signed)
Asympt, currently stable,  Cont same tx, to f/u any worsening symptoms or concerns

## 2021-06-14 NOTE — Assessment & Plan Note (Signed)
Lab Results  Component Value Date   HGBA1C 5.2 05/13/2021   Stable, pt to continue current medical treatment  - diet and wt control

## 2021-06-14 NOTE — Assessment & Plan Note (Signed)
BP Readings from Last 3 Encounters:  06/13/21 (!) 142/80  05/13/21 (!) 162/86  05/12/21 (!) 156/86   Uncontrolled here,pt states BP controlled < 140/90 at home, pt to continue medical treatment losartan 50

## 2021-06-17 ENCOUNTER — Other Ambulatory Visit: Payer: Self-pay

## 2021-06-17 ENCOUNTER — Encounter (HOSPITAL_COMMUNITY)
Admission: RE | Admit: 2021-06-17 | Discharge: 2021-06-17 | Disposition: A | Discharge status: Home / Self Care | Payer: PPO | Source: Ambulatory Visit | Attending: Internal Medicine | Admitting: Internal Medicine

## 2021-06-17 DIAGNOSIS — J449 Chronic obstructive pulmonary disease, unspecified: Secondary | ICD-10-CM

## 2021-06-17 NOTE — Progress Notes (Signed)
Daily Session Note  Patient Details  Name: Ryan Diaz MRN: [id number] Date of Birth: Nov 17, 1945 Referring Provider:   [name] [facility] Re[facility] Walk Test from 05/12/2021 in [place]  Referring Provider Dr. Annamaria Boots       Encounter Date: 06/17/2021  Check In:  Session Check In - 06/17/21 1104       Check-In   Supervising physician immediately available to respond to emergencies [facility] immediately available    Physician(s) Dr. Broadus John    Location [facility]    Staff Present Rosebud Poles, RN, Quentin Ore, MS, ACSM-CEP, Exercise Physiologist;Lisa Ysidro Evert, RN    Virtual Visit No    Medication changes reported     No    Fall or balance concerns reported    No    Tobacco Cessation No Change    Warm-up and Cool-down Performed as group-led instruction    Resistance Training Performed Yes    VAD Patient? No    PAD/SET Patient? No      Pain Assessment   Currently in Pain? No/denies    Multiple Pain Sites No             Capillary Blood Glucose: No results found for this or any previous visit (from the past 24 hour(s)).    Social History   Tobacco Use  Smoking Status Former   Packs/day: 1.50   Years: 35.00   Pack years: 52.50   Types: Cigarettes   Quit date: 09/04/2009   Years since quitting: 11.7  Smokeless Tobacco Never    Goals Met:  Exercise tolerated well No report of cardiac concerns or symptoms Strength training completed today  Goals Unmet:  Not Applicable  Comments: Service time is from 1015 to 1130    Dr. Fransico Him is Medical Director for Cardiac Rehab at [facility].

## 2021-06-19 ENCOUNTER — Other Ambulatory Visit: Payer: Self-pay

## 2021-06-19 ENCOUNTER — Encounter (HOSPITAL_COMMUNITY)
Admission: RE | Admit: 2021-06-19 | Discharge: 2021-06-19 | Disposition: A | Discharge status: Home / Self Care | Payer: PPO | Source: Ambulatory Visit | Attending: Internal Medicine | Admitting: Internal Medicine

## 2021-06-19 DIAGNOSIS — J449 Chronic obstructive pulmonary disease, unspecified: Secondary | ICD-10-CM

## 2021-06-19 NOTE — Progress Notes (Signed)
Daily Session Note  Patient Details  Name: Ryan Diaz MRN: [id number] Date of Birth: 1946-04-18 Referring Provider:   [name] Pulmonary Rehab Walk Test from 05/12/2021 in [place]  Referring Provider Dr. Annamaria Boots       Encounter Date: 06/19/2021  Check In:  Session Check In - 06/19/21 1137       Check-In   Supervising physician immediately available to respond to emergencies [facility] immediately available    Physician(s) Dr. Broadus John    Location [facility]    Staff Present Rosebud Poles, RN, Milus Glazier, MS, ACSM-CEP, CCRP, Exercise Physiologist;Lasheika Ortloff Ysidro Evert, RN    Virtual Visit No    Medication changes reported     No    Fall or balance concerns reported    No    Tobacco Cessation No Change    Warm-up and Cool-down Performed as group-led instruction    Resistance Training Performed Yes    VAD Patient? No    PAD/SET Patient? No      Pain Assessment   Currently in Pain? No/denies    Multiple Pain Sites No             Capillary Blood Glucose: No results found for this or any previous visit (from the past 24 hour(s)).    Social History   Tobacco Use  Smoking Status Former   Packs/day: 1.50   Years: 35.00   Pack years: 52.50   Types: Cigarettes   Quit date: 09/04/2009   Years since quitting: 11.7  Smokeless Tobacco Never    Goals Met:  Exercise tolerated well No report of cardiac concerns or symptoms Strength training completed today  Goals Unmet:  Not Applicable  Comments: Service time is from 1015 to 1125    Dr. Fransico Him is Medical Director for Cardiac Rehab at [facility].

## 2021-06-24 ENCOUNTER — Encounter (HOSPITAL_COMMUNITY)
Admission: RE | Admit: 2021-06-24 | Discharge: 2021-06-24 | Disposition: A | Discharge status: Home / Self Care | Payer: PPO | Source: Ambulatory Visit | Attending: Internal Medicine | Admitting: Internal Medicine

## 2021-06-24 ENCOUNTER — Other Ambulatory Visit: Payer: Self-pay

## 2021-06-24 VITALS — Wt 173.1 lb

## 2021-06-24 DIAGNOSIS — J449 Chronic obstructive pulmonary disease, unspecified: Secondary | ICD-10-CM

## 2021-06-24 NOTE — Progress Notes (Signed)
Daily Session Note  Patient Details  Name: Ryan Diaz MRN: [id number] Date of Birth: 11-06-1946 Referring Provider:   [name] Pulmonary Rehab Walk Test from 05/12/2021 in [place]  Referring Provider Dr. Annamaria Boots       Encounter Date: 06/24/2021  Check In:  Session Check In - 06/24/21 1110       Check-In   Supervising physician immediately available to respond to emergencies [facility] immediately available    Physician(s) Dr. Broadus John    [facility]    Staff Present Rosebud Poles, RN, BSN;Cleone Hulick Ysidro Evert, RN;Jessica Hassell Done, MS, ACSM-CEP, Exercise Physiologist    Virtual Visit No    Medication changes reported     No    Fall or balance concerns reported    No    Tobacco Cessation No Change    Warm-up and Cool-down Performed as group-led instruction    Resistance Training Performed Yes    VAD Patient? No    PAD/SET Patient? No      Pain Assessment   Currently in Pain? No/denies    Multiple Pain Sites No             Capillary Blood Glucose: No results found for this or any previous visit (from the past 24 hour(s)).   Exercise Prescription Changes - 06/24/21 1200       Response to Exercise   Blood Pressure (Admit) 122/76    Blood Pressure (Exercise) 140/80    Blood Pressure (Exit) 120/72    Heart Rate (Admit) 72 bpm    Heart Rate (Exercise) 98 bpm    Heart Rate (Exit) 89 bpm    Oxygen Saturation (Admit) 96 %    Oxygen Saturation (Exercise) 95 %    Oxygen Saturation (Exit) 96 %    Rating of Perceived Exertion (Exercise) 11    Perceived Dyspnea (Exercise) 1    Duration Continue with 30 min of aerobic exercise without signs/symptoms of physical distress.    Intensity THRR unchanged      Progression   Progression Continue to progress workloads to maintain intensity without signs/symptoms of physical distress.      Resistance Training   Training Prescription Yes    Weight Blue bands    Reps  10-15    Time 10 Minutes      NuStep   Level 4    SPM 80    Minutes 15    METs 3.1      Track   Laps 18    Minutes 15             Social History   Tobacco Use  Smoking Status Former   Packs/day: 1.50   Years: 35.00   Pack years: 52.50   Types: Cigarettes   Quit date: 09/04/2009   Years since quitting: 11.8  Smokeless Tobacco Never    Goals Met:  Exercise tolerated well No report of cardiac concerns or symptoms Strength training completed today  Goals Unmet:  Not Applicable  Comments: Service time is from 1021 to 1135    Dr. Fransico Him is Medical Director for Cardiac Rehab at [facility].

## 2021-06-26 ENCOUNTER — Other Ambulatory Visit: Payer: Self-pay

## 2021-06-26 ENCOUNTER — Encounter (HOSPITAL_COMMUNITY)
Admission: RE | Admit: 2021-06-26 | Discharge: 2021-06-26 | Disposition: A | Discharge status: Home / Self Care | Payer: PPO | Source: Ambulatory Visit | Attending: Internal Medicine | Admitting: Internal Medicine

## 2021-06-26 DIAGNOSIS — J449 Chronic obstructive pulmonary disease, unspecified: Secondary | ICD-10-CM

## 2021-06-26 NOTE — Progress Notes (Signed)
Daily Session Note  Patient Details  Name: Ryan Diaz MRN: [id number] Date of Birth: 1945-12-06 Referring Provider:   [name] Pulmonary Rehab Walk Test from 05/12/2021 in [place]  Referring Provider Dr. Annamaria Boots       Encounter Date: 06/26/2021  Check In:  Session Check In - 06/26/21 1136       Check-In   Supervising physician immediately available to respond to emergencies [facility] immediately available    Physician(s) Dr. Alfredia Ferguson    [facility]    Staff Present Rosebud Poles, RN, BSN;Graig Hessling Ysidro Evert, Cathleen Fears, MS, ACSM-CEP, Exercise Physiologist    Virtual Visit No    Medication changes reported     No    Fall or balance concerns reported    No    Tobacco Cessation No Change    Warm-up and Cool-down Performed as group-led instruction    Resistance Training Performed Yes    VAD Patient? No    PAD/SET Patient? No      Pain Assessment   Currently in Pain? No/denies    Multiple Pain Sites No             Capillary Blood Glucose: No results found for this or any previous visit (from the past 24 hour(s)).    Social History   Tobacco Use  Smoking Status Former   Packs/day: 1.50   Years: 35.00   Pack years: 52.50   Types: Cigarettes   Quit date: 09/04/2009   Years since quitting: 11.8  Smokeless Tobacco Never    Goals Met:  Exercise tolerated well No report of cardiac concerns or symptoms Strength training completed today  Goals Unmet:  Not Applicable  Comments: Service time is from 1015 to 1125    Dr. Fransico Him is Medical Director for Cardiac Rehab at [facility].

## 2021-07-01 ENCOUNTER — Encounter (HOSPITAL_COMMUNITY)
Admission: RE | Admit: 2021-07-01 | Discharge: 2021-07-01 | Disposition: A | Discharge status: Home / Self Care | Payer: PPO | Source: Ambulatory Visit | Attending: Internal Medicine | Admitting: Internal Medicine

## 2021-07-01 ENCOUNTER — Other Ambulatory Visit: Payer: Self-pay

## 2021-07-01 DIAGNOSIS — J449 Chronic obstructive pulmonary disease, unspecified: Secondary | ICD-10-CM | POA: Diagnosis not present

## 2021-07-01 NOTE — Progress Notes (Signed)
Daily Session Note  Patient Details  Name: Ryan Diaz MRN: [id number] Date of Birth: January 16, 1946 Referring Provider:   [name] Pulmonary Rehab Walk Test from 05/12/2021 in [place]  Referring Provider Dr. Annamaria Boots       Encounter Date: 07/01/2021  Check In:  Session Check In - 07/01/21 1121       Check-In   Supervising physician immediately available to respond to emergencies [facility] immediately available    Physician(s) Dr. Alfredia Ferguson    Location [facility]    Staff Present Rosebud Poles, RN, Quentin Ore, MS, ACSM-CEP, Exercise Physiologist;Joss Friedel Ysidro Evert, RN    Virtual Visit No    Medication changes reported     No    Fall or balance concerns reported    No    Tobacco Cessation No Change    Warm-up and Cool-down Performed as group-led instruction    Resistance Training Performed Yes    VAD Patient? No    PAD/SET Patient? No      Pain Assessment   Currently in Pain? No/denies    Multiple Pain Sites No             Capillary Blood Glucose: No results found for this or any previous visit (from the past 24 hour(s)).    Social History   Tobacco Use  Smoking Status Former   Packs/day: 1.50   Years: 35.00   Pack years: 52.50   Types: Cigarettes   Quit date: 09/04/2009   Years since quitting: 11.8  Smokeless Tobacco Never    Goals Met:  Exercise tolerated well No report of cardiac concerns or symptoms Strength training completed today  Goals Unmet:  Not Applicable  Comments: Service time is from 1015 to 1130    Dr. Fransico Him is Medical Director for Cardiac Rehab at [facility].

## 2021-07-01 NOTE — Progress Notes (Signed)
Pulmonary Individual Treatment Plan  Patient Details  Name: Ryan Diaz MRN: [id number] Date of Birth: 1945/12/31 Referring Provider:   Doristine Devoid Pulmonary Rehab Walk Test from 05/12/2021 in [facility] CARDIAC [facility]  Referring Provider Dr. Maple Hudson       Initial Encounter Date:  Flowsheet Row Pulmonary Rehab Walk Test from 05/12/2021 in [facility] CARDIAC REHAB  Date 05/12/21       Visit Diagnosis: Chronic obstructive pulmonary disease, unspecified COPD type (HCC)  Patient's Home Medications on Admission:   Current Outpatient Medications:    albuterol (PROVENTIL HFA;VENTOLIN HFA) 108 (90 BASE) MCG/ACT inhaler, Inhale 2 puffs into the lungs every 6 (six) hours as needed for wheezing or shortness of breath., Disp: , Rfl:    aspirin EC 81 MG tablet, Take 81 mg by mouth daily., Disp: , Rfl:    atorvastatin (LIPITOR) 80 MG tablet, Take 1 tablet (80 mg total) by mouth daily., Disp: 90 tablet, Rfl: 3   chlorhexidine (PERIDEX) 0.12 % solution, , Disp: , Rfl:    Cholecalciferol (VITAMIN D) 50 MCG (2000 UT) tablet, Take 2,000 Units by mouth daily., Disp: , Rfl:    clotrimazole (LOTRIMIN) 1 % cream, Apply 1 application topically 2 (two) times daily as needed (irritation)., Disp: , Rfl:    docusate sodium (COLACE) 100 MG capsule, Take 100 mg by mouth daily as needed for mild constipation., Disp: , Rfl:    donepezil (ARICEPT) 5 MG tablet, Take 1 tablet (5 mg total) by mouth at bedtime., Disp: 90 tablet, Rfl: 5   Ferrous Sulfate 27 MG TABS, Take 27 mg by mouth daily as needed (pt prefrence)., Disp: , Rfl:    fluocinonide ointment (LIDEX) 0.05 %, Apply 1 application topically 2 (two) times daily., Disp: 30 g, Rfl: 2   fluticasone (CUTIVATE) 0.05 % cream, Apply 1 application topically daily as needed (irritation)., Disp: , Rfl:    Fluticasone-Umeclidin-Vilant (TRELEGY ELLIPTA) 100-62.5-25 MCG/INH AEPB, Inhale 1 puff into the lungs daily., Disp: 60 each, Rfl:  11   Fluticasone-Umeclidin-Vilant (TRELEGY ELLIPTA) 200-62.5-25 MCG/INH AEPB, Inhale 1 puff into the lungs daily., Disp: 60 each, Rfl: 5   Fluticasone-Umeclidin-Vilant (TRELEGY ELLIPTA) 200-62.5-25 MCG/INH AEPB, Inhale 1 puff into the lungs daily., Disp: 60 each, Rfl: 0   gabapentin (NEURONTIN) 100 MG capsule, Take 1 capsule (100 mg total) by mouth 3 (three) times daily. (Patient taking differently: Take 100 mg by mouth 3 (three) times daily as needed (pain).), Disp: 90 capsule, Rfl: 5   ipratropium (ATROVENT) 0.06 % nasal spray, Place 2 sprays into both nostrils 3 (three) times daily., Disp: 15 mL, Rfl: 5   losartan (COZAAR) 50 MG tablet, Take 1 tablet (50 mg total) by mouth daily., Disp: 90 tablet, Rfl: 3   meloxicam (MOBIC) 15 MG tablet, 1 tab by mouth once daily as needed (Patient taking differently: Take 15 mg by mouth daily as needed for pain. 1 tab by mouth once daily as needed), Disp: 90 tablet, Rfl: 1   nitroGLYCERIN (NITROSTAT) 0.4 MG SL tablet, Place 1 tablet (0.4 mg total) under the tongue every 5 (five) minutes as needed for chest pain., Disp: 25 tablet, Rfl: 4   Omega-3 Fatty Acids (FISH OIL) 1000 MG CAPS, Take 1,000 mg by mouth 2 (two) times daily., Disp: , Rfl:    omeprazole (PRILOSEC) 20 MG capsule, Take 1 capsule (20 mg total) by mouth 2 (two) times daily. (Patient taking differently: Take 20 mg by mouth 2 (two) times daily as needed (  acid reflux).), Disp: 90 capsule, Rfl: 3   tadalafil (CIALIS) 20 MG tablet, Take 20 mg by mouth daily as needed for erectile dysfunction., Disp: , Rfl:    terazosin (HYTRIN) 2 MG capsule, Take 1 capsule (2 mg total) by mouth at bedtime., Disp: 90 capsule, Rfl: 3   traZODone (DESYREL) 50 MG tablet, Take 50 mg by mouth at bedtime as needed for sleep., Disp: , Rfl:   Past Medical History: Past Medical History:  Diagnosis Date   ABUSE, ALCOHOL, CONTINUOUS 08/08/2007   ALLERGIC RHINITIS 08/08/2007   ANXIETY 08/08/2007   Carpal tunnel syndrome    COPD  10/03/2009   DEPRESSION 08/08/2007   ERECTILE DYSFUNCTION 08/08/2007   GENITAL HERPES, HX OF 08/08/2007   GERD 08/08/2007   GLUCOSE INTOLERANCE 08/08/2007   HYPERLIPIDEMIA 08/08/2007   HYPERTENSION 08/08/2007   Impaired glucose tolerance 09/26/2011   Insomnia 05/18/2012   PAIN IN SOFT TISSUES OF LIMB 10/02/2009   Sleep apnea    OSA-uses CPAP nightly    Tobacco Use: Social History   Tobacco Use  Smoking Status Former   Packs/day: 1.50   Years: 35.00   Pack years: 52.50   Types: Cigarettes   Quit date: 09/04/2009   Years since quitting: 11.8  Smokeless Tobacco Never    Labs: Recent Review Advice worker     Labs for ITP Cardiac and Pulmonary Rehab Latest Ref Rng & Units 03/09/2018 10/19/2018 03/27/2019 03/27/2020 05/13/2021   Cholestrol 0 - 200 mg/dL 161 096 045 409 811   LDLCALC 0 - 99 mg/dL 54 - 65 79 -   LDLDIRECT mg/dL - 91.4 - - 78.2   HDL >95.62 mg/dL 13.08(M) 57.84 69.62 95.28(U) 42.80   Trlycerides 0.0 - 149.0 mg/dL 132.0(H) 222.0(H) 114.0 155.0(H) 203.0(H)   Hemoglobin A1c 4.6 - 6.5 % 5.6 5.8 5.9 5.9 5.2       Capillary Blood Glucose: No results found for: GLUCAP   Pulmonary Assessment Scores:  Pulmonary Assessment Scores     Row Name 05/12/21 1153 05/12/21 1218       ADL UCSD   ADL Phase Entry Entry    SOB Score total 32 --         CAT Score   CAT Score 11 --         mMRC Score   mMRC Score -- 1            UCSD: Self-administered rating of dyspnea associated with activities of daily living (ADLs) 6-point scale (0 = "not at all" to 5 = "maximal or unable to do because of breathlessness")  Scoring Scores range from 0 to 120.  Minimally important difference is 5 units  CAT: CAT can identify the health impairment of COPD patients and is better correlated with disease progression.  CAT has a scoring range of zero to 40. The CAT score is classified into four groups of low (less than 10), medium (10 - 20), high (21-30) and very high (31-40) based on the  impact level of disease on health status. A CAT score over 10 suggests significant symptoms.  A worsening CAT score could be explained by an exacerbation, poor medication adherence, poor inhaler technique, or progression of COPD or comorbid conditions.  CAT MCID is 2 points  mMRC: mMRC (Modified Medical Research Council) Dyspnea Scale is used to assess the degree of baseline functional disability in patients of respiratory disease due to dyspnea. No minimal important difference is established. A decrease in score of 1 point or greater  is considered a positive change.   Pulmonary Function Assessment:   Exercise Target Goals: Exercise Program Goal: Individual exercise prescription set using results from initial 6 min walk test and THRR while considering  patient's activity barriers and safety.   Exercise Prescription Goal: Initial exercise prescription builds to 30-45 minutes a day of aerobic activity, 2-3 days per week.  Home exercise guidelines will be given to patient during program as part of exercise prescription that the participant will acknowledge.  Activity Barriers & Risk Stratification:  Activity Barriers & Cardiac Risk Stratification - 05/12/21 1127       Activity Barriers & Cardiac Risk Stratification   Activity Barriers Arthritis;Joint Problems;Deconditioning;Muscular Weakness   has a torn ligament in right shoulder, surgery is not recommended.  Mobic helps with the shoulder pain.            6 Minute Walk:  6 Minute Walk     Row Name 05/12/21 1218         6 Minute Walk   Phase Initial     Distance 1534 feet     Walk Time 6 minutes     # of Rest Breaks 0     MPH 2.91     METS 3.57     RPE 11     Perceived Dyspnea  1     VO2 Peak 12.51     Symptoms No     Resting HR 63 bpm     Resting BP 162/89     Resting Oxygen Saturation  96 %     Exercise Oxygen Saturation  during 6 min walk 94 %     Max Ex. HR 97 bpm     Max Ex. BP 188/91     2 Minute Post BP  182/94           Interval HR   1 Minute HR 93     2 Minute HR 95     3 Minute HR 97     4 Minute HR 97     5 Minute HR 93     6 Minute HR 94     2 Minute Post HR 64     Interval Heart Rate? Yes           Interval Oxygen   Interval Oxygen? Yes     Baseline Oxygen Saturation % 96 %     1 Minute Oxygen Saturation % 96 %     1 Minute Liters of Oxygen 0 L     2 Minute Oxygen Saturation % 96 %     2 Minute Liters of Oxygen 0 L     3 Minute Oxygen Saturation % 94 %     3 Minute Liters of Oxygen 0 L     4 Minute Oxygen Saturation % 96 %     4 Minute Liters of Oxygen 0 L     5 Minute Oxygen Saturation % 96 %     5 Minute Liters of Oxygen 0 L     6 Minute Oxygen Saturation % 97 %     6 Minute Liters of Oxygen 0 L     2 Minute Post Oxygen Saturation % 99 %     2 Minute Post Liters of Oxygen 0 L              Oxygen Initial Assessment:  Oxygen Initial Assessment - 05/12/21 1153       Home Oxygen   Home Oxygen  Device None    Sleep Oxygen Prescription CPAP    Home Exercise Oxygen Prescription None    Home Resting Oxygen Prescription None    Compliance with Home Oxygen Use Yes      Initial 6 min Walk   Oxygen Used None      Program Oxygen Prescription   Program Oxygen Prescription None      Intervention   Short Term Goals To learn and exhibit compliance with exercise, home and travel O2 prescription;To learn and understand importance of monitoring SPO2 with pulse oximeter and demonstrate accurate use of the pulse oximeter.;To learn and understand importance of maintaining oxygen saturations>88%;To learn and demonstrate proper pursed lip breathing techniques or other breathing techniques. ;To learn and demonstrate proper use of respiratory medications    Long  Term Goals Exhibits compliance with exercise, home  and travel O2 prescription;Verbalizes importance of monitoring SPO2 with pulse oximeter and return demonstration;Maintenance of O2 saturations>88%;Exhibits proper  breathing techniques, such as pursed lip breathing or other method taught during program session;Compliance with respiratory medication;Demonstrates proper use of MDI's             Oxygen Re-Evaluation:  Oxygen Re-Evaluation     Row Name 06/02/21 1330             Program Oxygen Prescription   Program Oxygen Prescription None               Home Oxygen   Home Oxygen Device None       Sleep Oxygen Prescription CPAP       Home Exercise Oxygen Prescription None       Home Resting Oxygen Prescription None       Compliance with Home Oxygen Use Yes               Goals/Expected Outcomes   Short Term Goals To learn and exhibit compliance with exercise, home and travel O2 prescription;To learn and understand importance of monitoring SPO2 with pulse oximeter and demonstrate accurate use of the pulse oximeter.;To learn and understand importance of maintaining oxygen saturations>88%;To learn and demonstrate proper pursed lip breathing techniques or other breathing techniques. ;To learn and demonstrate proper use of respiratory medications       Long  Term Goals Exhibits compliance with exercise, home  and travel O2 prescription;Verbalizes importance of monitoring SPO2 with pulse oximeter and return demonstration;Maintenance of O2 saturations>88%;Exhibits proper breathing techniques, such as pursed lip breathing or other method taught during program session;Compliance with respiratory medication;Demonstrates proper use of MDI's       Goals/Expected Outcomes compliance and understanding of monitoring oxygen saturation and importance of pursed lip breathing.                Oxygen Discharge (Final Oxygen Re-Evaluation):  Oxygen Re-Evaluation - 06/02/21 1330       Program Oxygen Prescription   Program Oxygen Prescription None      Home Oxygen   Home Oxygen Device None    Sleep Oxygen Prescription CPAP    Home Exercise Oxygen Prescription None    Home Resting Oxygen Prescription None     Compliance with Home Oxygen Use Yes      Goals/Expected Outcomes   Short Term Goals To learn and exhibit compliance with exercise, home and travel O2 prescription;To learn and understand importance of monitoring SPO2 with pulse oximeter and demonstrate accurate use of the pulse oximeter.;To learn and understand importance of maintaining oxygen saturations>88%;To learn and demonstrate proper pursed lip breathing techniques or other  breathing techniques. ;To learn and demonstrate proper use of respiratory medications    Long  Term Goals Exhibits compliance with exercise, home  and travel O2 prescription;Verbalizes importance of monitoring SPO2 with pulse oximeter and return demonstration;Maintenance of O2 saturations>88%;Exhibits proper breathing techniques, such as pursed lip breathing or other method taught during program session;Compliance with respiratory medication;Demonstrates proper use of MDI's    Goals/Expected Outcomes compliance and understanding of monitoring oxygen saturation and importance of pursed lip breathing.             Initial Exercise Prescription:  Initial Exercise Prescription - 05/12/21 1200       Date of Initial Exercise RX and Referring Provider   Date 05/12/21    Referring Provider Dr. Maple Hudson    Expected Discharge Date 07/17/21      NuStep   Level 2    SPM 80    Minutes 15      Track   Minutes 16      Prescription Details   Frequency (times per week) 2    Duration Progress to 30 minutes of continuous aerobic without signs/symptoms of physical distress      Intensity   THRR 40-80% of Max Heartrate 58-116    Ratings of Perceived Exertion 11-13      Progression   Progression Continue to progress workloads to maintain intensity without signs/symptoms of physical distress.      Resistance Training   Training Prescription Yes    Weight Blue bands    Reps 10-15             Perform Capillary Blood Glucose checks as needed.  Exercise  Prescription Changes:   Exercise Prescription Changes     Row Name 05/27/21 1200 06/10/21 1100 06/24/21 1200         Response to Exercise   Blood Pressure (Admit) 114/66 112/62 122/76     Blood Pressure (Exercise) 124/68 112/70 140/80     Blood Pressure (Exit) 124/70 104/60 120/72     Heart Rate (Admit) 78 bpm 92 bpm 72 bpm     Heart Rate (Exercise) 104 bpm 112 bpm 98 bpm     Heart Rate (Exit) 87 bpm 93 bpm 89 bpm     Oxygen Saturation (Admit) 95 % 95 % 96 %     Oxygen Saturation (Exercise) 95 % 94 % 95 %     Oxygen Saturation (Exit) 97 % 94 % 96 %     Rating of Perceived Exertion (Exercise) 11 13 11      Perceived Dyspnea (Exercise) 1 2 1      Duration Continue with 30 min of aerobic exercise without signs/symptoms of physical distress. Continue with 30 min of aerobic exercise without signs/symptoms of physical distress. Continue with 30 min of aerobic exercise without signs/symptoms of physical distress.     Intensity Other (comment)  40-80% HR Max THRR unchanged THRR unchanged           Progression   Progression Continue to progress workloads to maintain intensity without signs/symptoms of physical distress. Continue to progress workloads to maintain intensity without signs/symptoms of physical distress. Continue to progress workloads to maintain intensity without signs/symptoms of physical distress.           Resistance Training   Training Prescription Yes Yes Yes     Weight Blue bands Blue bands Blue bands     Reps 10-15 10-15 10-15     Time 10 Minutes 10 Minutes 10 Minutes  NuStep   Level 3 3 4      SPM 80 80 80     Minutes 15 15 15      METs 2.6 2.4 3.1           Track   Laps 22 18 18      Minutes 15 15 15      METs 3.5 3.09 --              Exercise Comments:   Exercise Comments     Row Name 05/20/21 1150           Exercise Comments Pt completed first day of exercise and tolerated very well with no complaints or concerns. Ryan Diaz walked 20 laps on  the track in 15 minutes and also did 15 minutes on the Nustep with no issues. Ryan Diaz does have some shoulder mobility issues and has some limitations with the resistance bands but tolerated well otherwise. Will continue to monitor.                Exercise Goals and Review:   Exercise Goals     Row Name 05/12/21 1217 07/01/21 0930           Exercise Goals   Increase Physical Activity Yes Yes      Intervention Provide advice, education, support and counseling about physical activity/exercise needs.;Develop an individualized exercise prescription for aerobic and resistive training based on initial evaluation findings, risk stratification, comorbidities and participant's personal goals. Provide advice, education, support and counseling about physical activity/exercise needs.;Develop an individualized exercise prescription for aerobic and resistive training based on initial evaluation findings, risk stratification, comorbidities and participant's personal goals.      Expected Outcomes Short Term: Attend rehab on a regular basis to increase amount of physical activity.;Long Term: Add in home exercise to make exercise part of routine and to increase amount of physical activity.;Long Term: Exercising regularly at least 3-5 days a week. Short Term: Attend rehab on a regular basis to increase amount of physical activity.;Long Term: Add in home exercise to make exercise part of routine and to increase amount of physical activity.;Long Term: Exercising regularly at least 3-5 days a week.      Increase Strength and Stamina Yes Yes      Intervention Provide advice, education, support and counseling about physical activity/exercise needs.;Develop an individualized exercise prescription for aerobic and resistive training based on initial evaluation findings, risk stratification, comorbidities and participant's personal goals. Provide advice, education, support and counseling about physical activity/exercise  needs.;Develop an individualized exercise prescription for aerobic and resistive training based on initial evaluation findings, risk stratification, comorbidities and participant's personal goals.      Expected Outcomes Short Term: Increase workloads from initial exercise prescription for resistance, speed, and METs.;Short Term: Perform resistance training exercises routinely during rehab and add in resistance training at home;Long Term: Improve cardiorespiratory fitness, muscular endurance and strength as measured by increased METs and functional capacity ( ) Short Term: Increase workloads from initial exercise prescription for resistance, speed, and METs.;Short Term: Perform resistance training exercises routinely during rehab and add in resistance training at home;Long Term: Improve cardiorespiratory fitness, muscular endurance and strength as measured by increased METs and functional capacity (07/21/21)      Able to understand and use rate of perceived exertion (RPE) scale Yes Yes      Intervention Provide education and explanation on how to use RPE scale Provide education and explanation on how to use RPE scale      Expected Outcomes  Short Term: Able to use RPE daily in rehab to express subjective intensity level;Long Term:  Able to use RPE to guide intensity level when exercising independently Short Term: Able to use RPE daily in rehab to express subjective intensity level;Long Term:  Able to use RPE to guide intensity level when exercising independently      Able to understand and use Dyspnea scale Yes Yes      Intervention Provide education and explanation on how to use Dyspnea scale Provide education and explanation on how to use Dyspnea scale      Expected Outcomes Short Term: Able to use Dyspnea scale daily in rehab to express subjective sense of shortness of breath during exertion;Long Term: Able to use Dyspnea scale to guide intensity level when exercising independently Short Term: Able to use  Dyspnea scale daily in rehab to express subjective sense of shortness of breath during exertion;Long Term: Able to use Dyspnea scale to guide intensity level when exercising independently      Knowledge and understanding of Target Heart Rate Range (THRR) Yes Yes      Intervention Provide education and explanation of THRR including how the numbers were predicted and where they are located for reference Provide education and explanation of THRR including how the numbers were predicted and where they are located for reference      Expected Outcomes Short Term: Able to state/look up THRR;Long Term: Able to use THRR to govern intensity when exercising independently;Short Term: Able to use daily as guideline for intensity in rehab Short Term: Able to state/look up THRR;Short Term: Able to use daily as guideline for intensity in rehab;Long Term: Able to use THRR to govern intensity when exercising independently      Understanding of Exercise Prescription Yes Yes      Intervention Provide education, explanation, and written materials on patient's individual exercise prescription Provide education, explanation, and written materials on patient's individual exercise prescription      Expected Outcomes Short Term: Able to explain program exercise prescription;Long Term: Able to explain home exercise prescription to exercise independently Short Term: Able to explain program exercise prescription;Long Term: Able to explain home exercise prescription to exercise independently               Exercise Goals Re-Evaluation :  Exercise Goals Re-Evaluation     Row Name 06/02/21 1327 07/01/21 0931           Exercise Goal Re-Evaluation   Exercise Goals Review Increase Physical Activity;Increase Strength and Stamina;Able to understand and use rate of perceived exertion (RPE) scale;Able to understand and use Dyspnea scale;Knowledge and understanding of Target Heart Rate Range (THRR);Understanding of Exercise  Prescription Increase Physical Activity;Increase Strength and Stamina;Able to understand and use rate of perceived exertion (RPE) scale;Able to understand and use Dyspnea scale;Knowledge and understanding of Target Heart Rate Range (THRR);Understanding of Exercise Prescription      Comments Ryan Diaz has completed 4 exercise sessions and has tolerated very well so far. Ryan Diaz has been averaging 20 laps on the Track and is exercising at 2.6 METS on the Nustep. Ryan Diaz is also able to do the warm up/cool down and resistance exercises with no issues. Will continue to monitor and progress as Ryan Diaz is able. Ryan Diaz has completed 12 exercise sessions and has tolerated very well so far. Ryan Diaz has been averaging 20 laps on the Track and has progressed to 3.1 METS on the Nustep. Ryan Diaz is still able to do the warm up/cool down and resistance exercises with  no issues. Will continue to monitor and progress as Ryan Diaz is able.      Expected Outcomes Through exercise at rehab and home, the patient will decrease shortness of breath with daily activities and feel confident carrying out an exercise regimn at home. Through exercise at rehab and home, the patient will decrease shortness of breath with daily activities and feel confident carrying out an exercise regimn at home.               Discharge Exercise Prescription (Final Exercise Prescription Changes):  Exercise Prescription Changes - 06/24/21 1200       Response to Exercise   Blood Pressure (Admit) 122/76    Blood Pressure (Exercise) 140/80    Blood Pressure (Exit) 120/72    Heart Rate (Admit) 72 bpm    Heart Rate (Exercise) 98 bpm    Heart Rate (Exit) 89 bpm    Oxygen Saturation (Admit) 96 %    Oxygen Saturation (Exercise) 95 %    Oxygen Saturation (Exit) 96 %    Rating of Perceived Exertion (Exercise) 11    Perceived Dyspnea (Exercise) 1    Duration Continue with 30 min of aerobic exercise without signs/symptoms of physical distress.    Intensity THRR unchanged       Progression   Progression Continue to progress workloads to maintain intensity without signs/symptoms of physical distress.      Resistance Training   Training Prescription Yes    Weight Blue bands    Reps 10-15    Time 10 Minutes      NuStep   Level 4    SPM 80    Minutes 15    METs 3.1      Track   Laps 18    Minutes 15             Nutrition:  Target Goals: Understanding of nutrition guidelines, daily intake of sodium 1500mg , cholesterol 200mg , calories 30% from fat and 7% or less from saturated fats, daily to have 5 or more servings of fruits and vegetables.  Biometrics:  Pre Biometrics - 05/12/21 1129       Pre Biometrics   Height 5\' 9"  (1.753 m)    Weight 78.3 kg    BMI (Calculated) 25.48    Grip Strength 24 kg              Nutrition Therapy Plan and Nutrition Goals:  Nutrition Therapy & Goals - 05/30/21 1030       Nutrition Therapy   Diet General Healthful      Personal Nutrition Goals   Nutrition Goal Pt to identify food quantities necessary to achieve weight gain of 3-5 lbs at graduation from pulmonary rehab.    Personal Goal #2 Pt to build a healthy plate including vegetables, fruits, whole grains, and low-fat dairy products in a heart healthy meal plan.    Personal Goal #3 Pt to reduce/eliminate alcohol      Intervention Plan   Intervention Prescribe, educate and counsel regarding individualized specific dietary modifications aiming towards targeted core components such as weight, hypertension, lipid management, diabetes, heart failure and other comorbidities.;Nutrition handout(s) given to patient.    Expected Outcomes Short Term Goal: A plan has been developed with personal nutrition goals set during dietitian appointment.;Short Term Goal: Understand basic principles of dietary content, such as calories, fat, sodium, cholesterol and nutrients.             Nutrition Assessments:  MEDIFICTS Score Key: ?70 Need  to make dietary changes   40-70 Heart Healthy Diet ? 40 Therapeutic Level Cholesterol Diet  Flowsheet Row PULMONARY REHAB CHRONIC OBSTRUCTIVE PULMONARY DISEASE from 05/29/2021 in [facility] CARDIAC REHAB  Picture Your Plate Total Score on Admission 41      Picture Your Plate Scores: <16 Unhealthy dietary pattern with much room for improvement. 41-50 Dietary pattern unlikely to meet recommendations for good health and room for improvement. 51-60 More healthful dietary pattern, with some room for improvement.  >60 Healthy dietary pattern, although there may be some specific behaviors that could be improved.    Nutrition Goals Re-Evaluation:  Nutrition Goals Re-Evaluation     Row Name 05/30/21 1031 06/26/21 0702           Goals   Current Weight 169 lb (76.7 kg) 173 lb 1 oz (78.5 kg)      Nutrition Goal Pt to identify food quantities necessary to achieve weight gain of 3-5 lbs at graduation from pulmonary rehab. Pt to identify food quantities necessary to achieve weight gain of 3-5 lbs at graduation from pulmonary rehab.      Comment -- Weight maintained             Personal Goal #2 Re-Evaluation   Personal Goal #2 Pt to build a healthy plate including vegetables, fruits, whole grains, and low-fat dairy products in a heart healthy meal plan. Pt to build a healthy plate including vegetables, fruits, whole grains, and low-fat dairy products in a heart healthy meal plan.             Personal Goal #3 Re-Evaluation   Personal Goal #3 Pt to reduce/eliminate alcohol Pt to reduce/eliminate alcohol               Nutrition Goals Discharge (Final Nutrition Goals Re-Evaluation):  Nutrition Goals Re-Evaluation - 06/26/21 0702       Goals   Current Weight 173 lb 1 oz (78.5 kg)    Nutrition Goal Pt to identify food quantities necessary to achieve weight gain of 3-5 lbs at graduation from pulmonary rehab.    Comment Weight maintained      Personal Goal #2 Re-Evaluation   Personal Goal #2 Pt  to build a healthy plate including vegetables, fruits, whole grains, and low-fat dairy products in a heart healthy meal plan.      Personal Goal #3 Re-Evaluation   Personal Goal #3 Pt to reduce/eliminate alcohol             Psychosocial: Target Goals: Acknowledge presence or absence of significant depression and/or stress, maximize coping skills, provide positive support system. Participant is able to verbalize types and ability to use techniques and skills needed for reducing stress and depression.  Initial Review & Psychosocial Screening:  Initial Psych Review & Screening - 05/12/21 1154       Initial Review   Current issues with Current Sleep Concerns      Family Dynamics   Good Support System? Yes   son and a friend     Barriers   Psychosocial barriers to participate in program There are no identifiable barriers or psychosocial needs.      Screening Interventions   Interventions Encouraged to exercise    Expected Outcomes Long Term goal: The participant improves quality of Life and PHQ9 Scores as seen by post scores and/or verbalization of changes             Quality of Life Scores:  Scores of 19 and below  usually indicate a poorer quality of life in these areas.  A difference of  2-3 points is a clinically meaningful difference.  A difference of 2-3 points in the total score of the Quality of Life Index has been associated with significant improvement in overall quality of life, self-image, physical symptoms, and general health in studies assessing change in quality of life.  PHQ-9: Recent Review Flowsheet Data     Depression screen [facility] 2/9 06/13/2021 05/13/2021 05/12/2021 10/03/2020 07/26/2020   Decreased Interest 0 0 0 0 0   Down, Depressed, Hopeless 0 0 0 0 0   PHQ - 2 Score 0 0 0 0 0   Altered sleeping - - 3  - -   Tired, decreased energy - - 0 - -   Change in appetite - - 0 - -   Feeling bad or failure about yourself  - - 0 - -   Trouble concentrating - - 0 -  -   Moving slowly or fidgety/restless - - 0 - -   Suicidal thoughts - - 0 - -   PHQ-9 Score - - 3 - -   Difficult doing work/chores - - Not difficult at all - -      Interpretation of Total Score  Total Score Depression Severity:  1-4 = Minimal depression, 5-9 = Mild depression, 10-14 = Moderate depression, 15-19 = Moderately severe depression, 20-27 = Severe depression   Psychosocial Evaluation and Intervention:  Psychosocial Evaluation - 05/12/21 1155       Psychosocial Evaluation & Interventions   Interventions Encouraged to exercise with the program and follow exercise prescription    Comments no psychosocial concerns identified at this time    Expected Outcomes For patient to continue to be free of psychosocial concerns while participating in pulmonary rehab.    Continue Psychosocial Services  No Follow up required             Psychosocial Re-Evaluation:  Psychosocial Re-Evaluation     Row Name 06/02/21 1110 06/30/21 1257           Psychosocial Re-Evaluation   Current issues with None Identified Current Sleep Concerns      Comments No psychosocial concerns identified at this time, Ryan Diaz has had trouble staying asleep for many years and does not attribute it to depression.Ryan Diaz has a positive attitude when participating in pulmonary rehab, his son is very supportive and they are very close. Ryan Diaz continues to have sleep concerns, but Ryan Diaz does not attribute it to depression. His ability to stay asleep have been going on for years. Relaxation techniques have been given to Ryan Diaz, but it has not improved his sleep patterns.      Expected Outcomes For Ryan Diaz to continue to have no psychosocial concerns while participating in pulmonary rehab. For Ryan Diaz to improve his sleep schedule and have no psychosocial concerns while participating in pulmonary rehab.      Interventions Encouraged to attend Pulmonary Rehabilitation for the exercise Encouraged to attend Pulmonary  Rehabilitation for the exercise;Relaxation education      Continue Psychosocial Services  No Follow up required No Follow up required               Psychosocial Discharge (Final Psychosocial Re-Evaluation):  Psychosocial Re-Evaluation - 06/30/21 1257       Psychosocial Re-Evaluation   Current issues with Current Sleep Concerns    Comments Ryan Diaz continues to have sleep concerns, but Ryan Diaz does not attribute it to depression. His ability to stay  asleep have been going on for years. Relaxation techniques have been given to Ryan Diaz, but it has not improved his sleep patterns.    Expected Outcomes For Ryan Diaz to improve his sleep schedule and have no psychosocial concerns while participating in pulmonary rehab.    Interventions Encouraged to attend Pulmonary Rehabilitation for the exercise;Relaxation education    Continue Psychosocial Services  No Follow up required             Education: Education Goals: Education classes will be provided on a weekly basis, covering required topics. Participant will state understanding/return demonstration of topics presented.  Learning Barriers/Preferences:  Learning Barriers/Preferences - 05/12/21 1258       Learning Barriers/Preferences   Learning Barriers None    Learning Preferences Audio;Computer/Internet;Group Instruction;Individual Instruction;Pictoral;Skilled Demonstration;Written Material             Education Topics: Risk Factor Reduction:  -Group instruction that is supported by a PowerPoint presentation. Instructor discusses the definition of a risk factor, different risk factors for pulmonary disease, and how the heart and lungs work together.     Nutrition for Pulmonary Patient:  -Group instruction provided by PowerPoint slides, verbal discussion, and written materials to support subject matter. The instructor gives an explanation and review of healthy diet recommendations, which includes a discussion on weight management,  recommendations for fruit and vegetable consumption, as well as protein, fluid, caffeine, fiber, sodium, sugar, and alcohol. Tips for eating when patients are short of breath are discussed.   Pursed Lip Breathing:  -Group instruction that is supported by demonstration and informational handouts. Instructor discusses the benefits of pursed lip and diaphragmatic breathing and detailed demonstration on how to preform both.     Oxygen Safety:  -Group instruction provided by PowerPoint, verbal discussion, and written material to support subject matter. There is an overview of "What is Oxygen" and "Why do we need it".  Instructor also reviews how to create a safe environment for oxygen use, the importance of using oxygen as prescribed, and the risks of noncompliance. There is a brief discussion on traveling with oxygen and resources the patient may utilize.   Oxygen Equipment:  -Group instruction provided by [facility] Staff utilizing handouts, written materials, and equipment demonstrations.   Signs and Symptoms:  -Group instruction provided by written material and verbal discussion to support subject matter. Warning signs and symptoms of infection, stroke, and heart attack are reviewed and when to call the physician/911 reinforced. Tips for preventing the spread of infection discussed.   Advanced Directives:  -Group instruction provided by verbal instruction and written material to support subject matter. Instructor reviews Advanced Directive laws and proper instruction for filling out document.   Pulmonary Video:  -Group video education that reviews the importance of medication and oxygen compliance, exercise, good nutrition, pulmonary hygiene, and pursed lip and diaphragmatic breathing for the pulmonary patient.   Exercise for the Pulmonary Patient:  -Group instruction that is supported by a PowerPoint presentation. Instructor discusses benefits of exercise, core components of exercise,  frequency, duration, and intensity of an exercise routine, importance of utilizing pulse oximetry during exercise, safety while exercising, and options of places to exercise outside of rehab.   Flowsheet Row PULMONARY REHAB CHRONIC OBSTRUCTIVE PULMONARY DISEASE from 06/26/2021 in [facility] CARDIAC REHAB  Date 06/12/21  Educator Jess-Handout  [METS]  Instruction Review Code 1- Verbalizes Understanding       Pulmonary Medications:  -Verbally interactive group education provided by instructor with focus on inhaled medications  and proper administration. Flowsheet Row PULMONARY REHAB CHRONIC OBSTRUCTIVE PULMONARY DISEASE from 06/26/2021 in [facility] CARDIAC REHAB  Date 06/19/21  Educator Handout       Anatomy and Physiology of the Respiratory System and Intimacy:  -Group instruction provided by PowerPoint, verbal discussion, and written material to support subject matter. Instructor reviews respiratory cycle and anatomical components of the respiratory system and their functions. Instructor also reviews differences in obstructive and restrictive respiratory diseases with examples of each. Intimacy, Sex, and Sexuality differences are reviewed with a discussion on how relationships can change when diagnosed with pulmonary disease. Common sexual concerns are reviewed. Flowsheet Row PULMONARY REHAB CHRONIC OBSTRUCTIVE PULMONARY DISEASE from 06/26/2021 in [facility] CARDIAC REHAB  Date 06/26/21  Educator Handout       MD DAY -A group question and answer session with a medical doctor that allows participants to ask questions that relate to their pulmonary disease state.   OTHER EDUCATION -Group or individual verbal, written, or video instructions that support the educational goals of the pulmonary rehab program. Flowsheet Row PULMONARY REHAB CHRONIC OBSTRUCTIVE PULMONARY DISEASE from 06/26/2021 in [facility] CARDIAC REHAB   Date 06/05/21  Educator Handout  [My Plate]       Holiday Eating Survival Tips:  -Group instruction provided by PowerPoint slides, verbal discussion, and written materials to support subject matter. The instructor gives patients tips, tricks, and techniques to help them not only survive but enjoy the holidays despite the onslaught of food that accompanies the holidays.   Knowledge Questionnaire Score:  Knowledge Questionnaire Score - 05/12/21 1154       Knowledge Questionnaire Score   Pre Score 13/18             Core Components/Risk Factors/Patient Goals at Admission:  Personal Goals and Risk Factors at Admission - 05/12/21 1157       Core Components/Risk Factors/Patient Goals on Admission   Improve shortness of breath with ADL's Yes    Intervention Provide education, individualized exercise plan and daily activity instruction to help decrease symptoms of SOB with activities of daily living.    Expected Outcomes Short Term: Improve cardiorespiratory fitness to achieve a reduction of symptoms when performing ADLs;Long Term: Be able to perform more ADLs without symptoms or delay the onset of symptoms             Core Components/Risk Factors/Patient Goals Review:   Goals and Risk Factor Review     Row Name 05/12/21 1157 06/02/21 1113 06/30/21 1301         Core Components/Risk Factors/Patient Goals Review   Personal Goals Review Develop more efficient breathing techniques such as purse lipped breathing and diaphragmatic breathing and practicing self-pacing with activity.;Increase knowledge of respiratory medications and ability to use respiratory devices properly.;Improve shortness of breath with ADL's Develop more efficient breathing techniques such as purse lipped breathing and diaphragmatic breathing and practicing self-pacing with activity.;Increase knowledge of respiratory medications and ability to use respiratory devices properly.;Improve shortness of breath with  ADL's Develop more efficient breathing techniques such as purse lipped breathing and diaphragmatic breathing and practicing self-pacing with activity.;Increase knowledge of respiratory medications and ability to use respiratory devices properly.;Improve shortness of breath with ADL's     Review -- Ryan Diaz is a high functioning COPD patient, Ryan Diaz is able exercise without supplemental oxygen @ 2.5 mets on the nustep and walking almost a mile on the track in 15 minutes.  Ryan Diaz is progressing well, and seems  to be enjoying pulmonary rehab. Ryan Diaz has improved his strength and stamina d/t exercising in pulmonary rehab which has improved his activities at home.  Ryan Diaz does not enjoy exercising, but we have been supportive to the fact that Ryan Diaz would benefit from continuing home exercise after Ryan Diaz graduates from pulmonary rehab.     Expected Outcomes -- See admission goals. See admission goals.              Core Components/Risk Factors/Patient Goals at Discharge (Final Review):   Goals and Risk Factor Review - 06/30/21 1301       Core Components/Risk Factors/Patient Goals Review   Personal Goals Review Develop more efficient breathing techniques such as purse lipped breathing and diaphragmatic breathing and practicing self-pacing with activity.;Increase knowledge of respiratory medications and ability to use respiratory devices properly.;Improve shortness of breath with ADL's    Review Ryan Diaz has improved his strength and stamina d/t exercising in pulmonary rehab which has improved his activities at home.  Ryan Diaz does not enjoy exercising, but we have been supportive to the fact that Ryan Diaz would benefit from continuing home exercise after Ryan Diaz graduates from pulmonary rehab.    Expected Outcomes See admission goals.             ITP Comments:   Comments: ITP REVIEW Pt is making expected progress toward pulmonary rehab goals after completing 12 sessions. Recommend continued exercise, life style modification,  education, and utilization of breathing techniques to increase stamina and strength and decrease shortness of breath with exertion.

## 2021-07-03 ENCOUNTER — Encounter (HOSPITAL_COMMUNITY)
Admission: RE | Admit: 2021-07-03 | Discharge: 2021-07-03 | Disposition: A | Discharge status: Home / Self Care | Payer: PPO | Source: Ambulatory Visit | Attending: Internal Medicine | Admitting: Internal Medicine

## 2021-07-03 ENCOUNTER — Other Ambulatory Visit: Payer: Self-pay

## 2021-07-03 DIAGNOSIS — J449 Chronic obstructive pulmonary disease, unspecified: Secondary | ICD-10-CM

## 2021-07-03 NOTE — Progress Notes (Signed)
Daily Session Note  Patient Details  Name: Ryan Diaz MRN: [id number] Date of Birth: 12/17/1945 Referring Provider:   Flowsheet Row Pulmonary Rehab Walk Test from 05/12/2021 in [facility] CARDIAC REHAB  Referring Provider Dr. Young       Encounter Date: 07/03/2021  Check In:  Session Check In - 07/03/21 1112       Check-In   Supervising physician immediately available to respond to emergencies [facility] immediately available    Physician(s) Dr. Ayiku    Location [facility]    Staff Present  , RN, BSN;Carlette Carlton, RN, BSN;Kaylee Davis, MS, ACSM-CEP, Exercise Physiologist;David Makemson, MS, ACSM-CEP, CCRP, Exercise Physiologist    Virtual Visit No    Medication changes reported     No    Fall or balance concerns reported    No    Tobacco Cessation No Change    Warm-up and Cool-down Performed as group-led instruction    Resistance Training Performed Yes    VAD Patient? No    PAD/SET Patient? No      Pain Assessment   Currently in Pain? No/denies    Multiple Pain Sites No             Capillary Blood Glucose: No results found for this or any previous visit (from the past 24 hour(s)).    Social History   Tobacco Use  Smoking Status Former   Packs/day: 1.50   Years: 35.00   Pack years: 52.50   Types: Cigarettes   Quit date: 09/04/2009   Years since quitting: 11.8  Smokeless Tobacco Never    Goals Met:  Proper associated with RPD/PD & O2 Sat Independence with exercise equipment Exercise tolerated well No report of cardiac concerns or symptoms Strength training completed today  Goals Unmet:  Not Applicable  Comments: Service time is from 1015 to 1125    Dr. Traci Turner is Medical Director for Cardiac Rehab at [facility]. 

## 2021-07-08 ENCOUNTER — Encounter (HOSPITAL_COMMUNITY)
Admission: RE | Admit: 2021-07-08 | Discharge: 2021-07-08 | Disposition: A | Discharge status: Home / Self Care | Payer: PPO | Source: Ambulatory Visit | Attending: Internal Medicine | Admitting: Internal Medicine

## 2021-07-08 ENCOUNTER — Other Ambulatory Visit: Payer: Self-pay

## 2021-07-08 VITALS — Wt 169.3 lb

## 2021-07-08 DIAGNOSIS — J449 Chronic obstructive pulmonary disease, unspecified: Secondary | ICD-10-CM

## 2021-07-08 NOTE — Progress Notes (Signed)
Daily Session Note  Patient Details  Name: Ryan Diaz MRN: [id number] Date of Birth: 1946/03/06 Referring Provider:   [name] Pulmonary Rehab Walk Test from 05/12/2021 in [place]  Referring Provider Dr. Annamaria Boots       Encounter Date: 07/08/2021  Check In:  Session Check In - 07/08/21 1156       Check-In   Supervising physician immediately available to respond to emergencies [facility] immediately available    Physician(s) Dr. Mal Misty    Location [facility]    Staff Present Leda Roys, MS, ACSM-CEP, Exercise Physiologist;Kaylee Rosana Hoes, MS, ACSM-CEP, Exercise Physiologist;Mariyanna Mucha Ysidro Evert, RN    Virtual Visit No    Medication changes reported     No    Fall or balance concerns reported    No    Tobacco Cessation No Change    Warm-up and Cool-down Performed as group-led instruction    Resistance Training Performed Yes    VAD Patient? No    PAD/SET Patient? No      Pain Assessment   Currently in Pain? Other (Comment)    Multiple Pain Sites No             Capillary Blood Glucose: No results found for this or any previous visit (from the past 24 hour(s)).   Exercise Prescription Changes - 07/08/21 1200       Response to Exercise   Blood Pressure (Admit) 120/78    Blood Pressure (Exercise) 108/70    Blood Pressure (Exit) 124/82    Heart Rate (Admit) 77 bpm    Heart Rate (Exercise) 106 bpm    Heart Rate (Exit) 81 bpm    Oxygen Saturation (Admit) 96 %    Oxygen Saturation (Exercise) 93 %    Oxygen Saturation (Exit) 96 %    Rating of Perceived Exertion (Exercise) 11    Perceived Dyspnea (Exercise) 1    Duration Continue with 30 min of aerobic exercise without signs/symptoms of physical distress.    Intensity THRR unchanged      Progression   Progression Continue to progress workloads to maintain intensity without signs/symptoms of physical distress.      Resistance Training   Training Prescription Yes     Weight blue bands    Reps 10-15    Time 10 Minutes      NuStep   Level 4    SPM 80    Minutes 15    METs 3.4      Track   Laps 18    Minutes 15             Social History   Tobacco Use  Smoking Status Former   Packs/day: 1.50   Years: 35.00   Pack years: 52.50   Types: Cigarettes   Quit date: 09/04/2009   Years since quitting: 11.8  Smokeless Tobacco Never    Goals Met:  Exercise tolerated well No report of cardiac concerns or symptoms Strength training completed today  Goals Unmet:  Not Applicable  Comments: Service time is from 1018 to 1138    Dr. Fransico Him is Medical Director for Cardiac Rehab at [facility].

## 2021-07-10 ENCOUNTER — Encounter (HOSPITAL_COMMUNITY)
Admission: RE | Admit: 2021-07-10 | Discharge: 2021-07-10 | Disposition: A | Discharge status: Home / Self Care | Payer: PPO | Source: Ambulatory Visit | Attending: Internal Medicine | Admitting: Internal Medicine

## 2021-07-10 ENCOUNTER — Other Ambulatory Visit: Payer: Self-pay

## 2021-07-10 DIAGNOSIS — J449 Chronic obstructive pulmonary disease, unspecified: Secondary | ICD-10-CM | POA: Diagnosis not present

## 2021-07-10 NOTE — Progress Notes (Signed)
Daily Session Note  Patient Details  Name: Ryan Diaz MRN: [id number] Date of Birth: [birth year] Referring Provider:   [name] Pulmonary Rehab Walk Test from 05/12/2021 in [place]  Referring Provider Dr. Annamaria Boots       Encounter Date: 07/10/2021  Check In:  Session Check In - 07/10/21 1146       Check-In   Supervising physician immediately available to respond to emergencies [facility] immediately available    Physician(s) Dr. Sloan Leiter    Location [facility]    Staff Present Lesly Rubenstein, MS, ACSM-CEP, CCRP, Exercise Physiologist;Jessica Hassell Done, MS, ACSM-CEP, Exercise Physiologist;Hallis Meditz Rosana Hoes, MS, ACSM-CEP, Exercise Physiologist;Lisa Ysidro Evert, RN    Virtual Visit No    Medication changes reported     No    Fall or balance concerns reported    No    Tobacco Cessation No Change    Warm-up and Cool-down Performed as group-led instruction    Resistance Training Performed Yes    VAD Patient? No    PAD/SET Patient? No      Pain Assessment   Currently in Pain? No/denies    Multiple Pain Sites No             Capillary Blood Glucose: No results found for this or any previous visit (from the past 24 hour(s)).    Social History   Tobacco Use  Smoking Status Former   Packs/day: 1.50   Years: 35.00   Pack years: 52.50   Types: Cigarettes   Quit date: 09/04/2009   Years since quitting: 11.8  Smokeless Tobacco Never    Goals Met:  Proper associated with RPD/PD & O2 Sat Independence with exercise equipment Exercise tolerated well No report of cardiac concerns or symptoms Strength training completed today  Goals Unmet:  Not Applicable  Comments: Service time is from 1015 to 1130    Dr. Fransico Him is Medical Director for Cardiac Rehab at [facility]

## 2021-07-15 ENCOUNTER — Encounter (HOSPITAL_COMMUNITY)
Admission: RE | Admit: 2021-07-15 | Discharge: 2021-07-15 | Disposition: A | Discharge status: Home / Self Care | Payer: PPO | Source: Ambulatory Visit | Attending: Internal Medicine | Admitting: Internal Medicine

## 2021-07-15 ENCOUNTER — Other Ambulatory Visit: Payer: Self-pay

## 2021-07-15 DIAGNOSIS — J449 Chronic obstructive pulmonary disease, unspecified: Secondary | ICD-10-CM | POA: Diagnosis not present

## 2021-07-15 NOTE — Progress Notes (Signed)
Daily Session Note  Patient Details  Name: Ryan Diaz MRN: [id number] Date of Birth: January 06, 1946 Referring Provider:   [name] Pulmonary Rehab Walk Test from 05/12/2021 in [place]  Referring Provider Dr. Annamaria Boots       Encounter Date: 07/15/2021  Check In:  Session Check In - 07/15/21 1157       Check-In   Supervising physician immediately available to respond to emergencies [facility] immediately available    Physician(s) Dr. Deretha Emory    Location [facility]    Staff Present Elmon Else, MS, ACSM-CEP, Exercise Physiologist;Lisa Ysidro Evert, RN;Olinty Celesta Aver, MS, ACSM CEP, Exercise Physiologist;Joan Leonia Reeves, RN, BSN    Virtual Visit No    Medication changes reported     No    Fall or balance concerns reported    No    Tobacco Cessation No Change    Warm-up and Cool-down Performed as group-led instruction    Resistance Training Performed Yes    VAD Patient? No      Pain Assessment   Currently in Pain? No/denies    Multiple Pain Sites No             Capillary Blood Glucose: No results found for this or any previous visit (from the past 24 hour(s)).    Social History   Tobacco Use  Smoking Status Former   Packs/day: 1.50   Years: 35.00   Pack years: 52.50   Types: Cigarettes   Quit date: 09/04/2009   Years since quitting: 11.8  Smokeless Tobacco Never    Goals Met:  Proper associated with RPD/PD & O2 Sat Independence with exercise equipment Exercise tolerated well No report of concerns or symptoms today Strength training completed today  Goals Unmet:  Not Applicable  Comments: Service time is from 1015 to 1137    Dr. Fransico Him is Medical Director for Cardiac Rehab at [facility].

## 2021-07-17 ENCOUNTER — Other Ambulatory Visit: Payer: Self-pay

## 2021-07-17 ENCOUNTER — Encounter (HOSPITAL_COMMUNITY)
Admission: RE | Admit: 2021-07-17 | Discharge: 2021-07-17 | Disposition: A | Discharge status: Home / Self Care | Payer: PPO | Source: Ambulatory Visit | Attending: Internal Medicine | Admitting: Internal Medicine

## 2021-07-17 VITALS — Wt 174.2 lb

## 2021-07-17 DIAGNOSIS — J449 Chronic obstructive pulmonary disease, unspecified: Secondary | ICD-10-CM | POA: Diagnosis not present

## 2021-07-17 NOTE — Progress Notes (Signed)
Daily Session Note  Patient Details  Name: Ryan Diaz MRN: [id number] Date of Birth: 04-20-1946 Referring Provider:   [name] Pulmonary Rehab Walk Test from 05/12/2021 in [place]  Referring Provider Dr. Annamaria Boots       Encounter Date: 07/17/2021  Check In:  Session Check In - 07/17/21 1141       Check-In   Supervising physician immediately available to respond to emergencies [facility] immediately available    Physician(s) Dr. Deretha Emory    Location [facility]    Staff Present Elmon Else, MS, ACSM-CEP, Exercise Physiologist;Christle Nolting Ysidro Evert, RN;Joan Leonia Reeves, RN, BSN    Virtual Visit No    Medication changes reported     No    Fall or balance concerns reported    No    Tobacco Cessation No Change    Warm-up and Cool-down Performed as group-led instruction    Resistance Training Performed Yes    VAD Patient? No      Pain Assessment   Currently in Pain? No/denies    Multiple Pain Sites No             Capillary Blood Glucose: No results found for this or any previous visit (from the past 24 hour(s)).    Social History   Tobacco Use  Smoking Status Former   Packs/day: 1.50   Years: 35.00   Pack years: 52.50   Types: Cigarettes   Quit date: 09/04/2009   Years since quitting: 11.8  Smokeless Tobacco Never    Goals Met:  Exercise tolerated well No report of concerns or symptoms today Strength training completed today  Goals Unmet:  Not Applicable  Comments: Service time is from 1015 to 1130    Dr. Fransico Him is Medical Director for Cardiac Rehab at [facility].

## 2021-07-22 NOTE — Progress Notes (Signed)
Discharge Progress Report  Patient Details  Name: Ryan Diaz MRN: [id number] Date of Birth: 1946/05/11 Referring Provider:   [name] Pulmonary Rehab Walk Test from 05/12/2021 in [place]  Referring Provider Dr. Annamaria Boots        Number of Visits: 18  Reason for Discharge:  Patient reached a stable level of exercise. Patient independent in their exercise. Patient has met program and personal goals.  Smoking History:  Social History   Tobacco Use  Smoking Status Former   Packs/day: 1.50   Years: 35.00   Pack years: 52.50   Types: Cigarettes   Quit date: 09/04/2009   Years since quitting: 11.8  Smokeless Tobacco Never    Diagnosis:  Chronic obstructive pulmonary disease, unspecified COPD type ([place])  ADL [facility]:  Pulmonary Assessment Scores     Row Name 05/12/21 1153 05/12/21 1218 07/17/21 1157     ADL [facility]   ADL Phase Entry Entry Exit   SOB Score total 32 -- 15     CAT Score   CAT Score 11 -- 9     mMRC Score   mMRC Score -- 1 1            Initial Exercise Prescription:  Initial Exercise Prescription - 05/12/21 1200       Date of Initial Exercise RX and Referring Provider   Date 05/12/21    Referring Provider Dr. Annamaria Boots    Expected Discharge Date 07/17/21      NuStep   Level 2    SPM 80    Minutes 15      Track   Minutes 16      Prescription Details   Frequency (times per week) 2    Duration Progress to 30 minutes of continuous aerobic without signs/symptoms of physical distress      Intensity   THRR 40-80% of Max Heartrate 58-116    Ratings of Perceived Exertion 11-13      Progression   Progression Continue to progress workloads to maintain intensity without signs/symptoms of physical distress.      Resistance Training   Training Prescription Yes    Weight Blue bands    Reps 10-15             Discharge Exercise Prescription (Final Exercise Prescription Changes):  Exercise Prescription  Changes - 07/08/21 1200       Response to Exercise   Blood Pressure (Admit) 120/78    Blood Pressure (Exercise) 108/70    Blood Pressure (Exit) 124/82    Heart Rate (Admit) 77 bpm    Heart Rate (Exercise) 106 bpm    Heart Rate (Exit) 81 bpm    Oxygen Saturation (Admit) 96 %    Oxygen Saturation (Exercise) 93 %    Oxygen Saturation (Exit) 96 %    Rating of Perceived Exertion (Exercise) 11    Perceived Dyspnea (Exercise) 1    Duration Continue with 30 min of aerobic exercise without signs/symptoms of physical distress.    Intensity THRR unchanged      Progression   Progression Continue to progress workloads to maintain intensity without signs/symptoms of physical distress.      Resistance Training   Training Prescription Yes    Weight blue bands    Reps 10-15    Time 10 Minutes      NuStep   Level 4    SPM 80    Minutes 15    METs 3.4  Track   Laps 18    Minutes 15             Functional Capacity:  [place] Name 05/12/21 1218 07/17/21 1147       6 Minute Walk   Phase Initial Discharge    Distance 1534 feet 1665 feet    Distance % Change -- 8.54 %    Distance Feet Change -- 131 ft    Walk Time 6 minutes 6 minutes    # of Rest Breaks 0 0    MPH 2.91 3.15    METS 3.57 3.8    RPE 11 11    Perceived Dyspnea  1 1    VO2 Peak 12.51 13.32    Symptoms No No    Resting HR 63 bpm 79 bpm    Resting BP 162/89 140/74    Resting Oxygen Saturation  96 % 98 %    Exercise Oxygen Saturation  during 6 min walk 94 % 90 %    Max Ex. HR 97 bpm 123 bpm    Max Ex. BP 188/91 150/80    2 Minute Post BP 182/94 140/78         Interval HR   1 Minute HR 93 94    2 Minute HR 95 107    3 Minute HR 97 105    4 Minute HR 97 111    5 Minute HR 93 120    6 Minute HR 94 123    2 Minute Post HR 64 87    Interval Heart Rate? Yes Yes         Interval Oxygen   Interval Oxygen? Yes Yes    Baseline Oxygen Saturation % 96 % 98 %    1 Minute Oxygen Saturation %  96 % 96 %    1 Minute Liters of Oxygen 0 L 0 L    2 Minute Oxygen Saturation % 96 % 94 %    2 Minute Liters of Oxygen 0 L 0 L    3 Minute Oxygen Saturation % 94 % 93 %    3 Minute Liters of Oxygen 0 L 0 L    4 Minute Oxygen Saturation % 96 % 90 %    4 Minute Liters of Oxygen 0 L 0 L    5 Minute Oxygen Saturation % 96 % 95 %    5 Minute Liters of Oxygen 0 L 0 L    6 Minute Oxygen Saturation % 97 % 95 %    6 Minute Liters of Oxygen 0 L 0 L    2 Minute Post Oxygen Saturation % 99 % 97 %    2 Minute Post Liters of Oxygen 0 L 0 L             Psychological, QOL, Others - Outcomes: PHQ 2/9: Depression screen [facility] 2/9 07/17/2021 06/13/2021 05/13/2021 05/12/2021 10/03/2020  Decreased Interest 0 0 0 0 0  Down, Depressed, Hopeless 0 0 0 0 0  PHQ - 2 Score 0 0 0 0 0  Altered sleeping 0 - - 3 -  Tired, decreased energy 0 - - 0 -  Change in appetite 0 - - 0 -  Feeling bad or failure about yourself  0 - - 0 -  Trouble concentrating 0 - - 0 -  Moving slowly or fidgety/restless 0 - - 0 -  Suicidal thoughts 0 - - 0 -  PHQ-9 Score 0 - -  3 -  Difficult doing work/chores Not difficult at all - - Not difficult at all -    Quality of Life:   Personal Goals: Goals established at orientation with interventions provided to work toward goal.  Personal Goals and Risk Factors at Admission - 05/12/21 1157       Core Components/Risk Factors/Patient Goals on Admission   Improve shortness of breath with ADL's Yes    Intervention Provide education, individualized exercise plan and daily activity instruction to help decrease symptoms of SOB with activities of daily living.    Expected Outcomes Short Term: Improve cardiorespiratory fitness to achieve a reduction of symptoms when performing ADLs;Long Term: Be able to perform more ADLs without symptoms or delay the onset of symptoms              Personal Goals Discharge:  Goals and Risk Factor Review     Row Name 05/12/21 1157 06/02/21 1113 06/30/21  1301         Core Components/Risk Factors/Patient Goals Review   Personal Goals Review Develop more efficient breathing techniques such as purse lipped breathing and diaphragmatic breathing and practicing self-pacing with activity.;Increase knowledge of respiratory medications and ability to use respiratory devices properly.;Improve shortness of breath with ADL's Develop more efficient breathing techniques such as purse lipped breathing and diaphragmatic breathing and practicing self-pacing with activity.;Increase knowledge of respiratory medications and ability to use respiratory devices properly.;Improve shortness of breath with ADL's Develop more efficient breathing techniques such as purse lipped breathing and diaphragmatic breathing and practicing self-pacing with activity.;Increase knowledge of respiratory medications and ability to use respiratory devices properly.;Improve shortness of breath with ADL's     Review -- Fabrizio is a high functioning COPD patient, he is able exercise without supplemental oxygen @ 2.5 mets on the nustep and walking almost a mile on the track in 15 minutes.  He is progressing well, and seems to be enjoying pulmonary rehab. [place] has improved his strength and stamina d/t exercising in pulmonary rehab which has improved his activities at home.  He does not enjoy exercising, but we have been supportive to the fact that he would benefit from continuing home exercise after he graduates from pulmonary rehab.     Expected Outcomes -- See admission goals. See admission goals.              Exercise Goals and Review:  Exercise Goals     Row Name 05/12/21 1217 07/01/21 0930           Exercise Goals   Increase Physical Activity Yes Yes      Intervention Provide advice, education, support and counseling about physical activity/exercise needs.;Develop an individualized exercise prescription for aerobic and resistive training based on initial evaluation findings, risk  stratification, comorbidities and participant's personal goals. Provide advice, education, support and counseling about physical activity/exercise needs.;Develop an individualized exercise prescription for aerobic and resistive training based on initial evaluation findings, risk stratification, comorbidities and participant's personal goals.      Expected Outcomes Short Term: Attend rehab on a regular basis to increase amount of physical activity.;Long Term: Add in home exercise to make exercise part of routine and to increase amount of physical activity.;Long Term: Exercising regularly at least 3-5 days a week. Short Term: Attend rehab on a regular basis to increase amount of physical activity.;Long Term: Add in home exercise to make exercise part of routine and to increase amount of physical activity.;Long Term: Exercising regularly at least 3-5 days a week.  Increase Strength and Stamina Yes Yes      Intervention Provide advice, education, support and counseling about physical activity/exercise needs.;Develop an individualized exercise prescription for aerobic and resistive training based on initial evaluation findings, risk stratification, comorbidities and participant's personal goals. Provide advice, education, support and counseling about physical activity/exercise needs.;Develop an individualized exercise prescription for aerobic and resistive training based on initial evaluation findings, risk stratification, comorbidities and participant's personal goals.      Expected Outcomes Short Term: Increase workloads from initial exercise prescription for resistance, speed, and METs.;Short Term: Perform resistance training exercises routinely during rehab and add in resistance training at home;Long Term: Improve cardiorespiratory fitness, muscular endurance and strength as measured by increased METs and functional capacity (6MWT) Short Term: Increase workloads from initial exercise prescription for  resistance, speed, and METs.;Short Term: Perform resistance training exercises routinely during rehab and add in resistance training at home;Long Term: Improve cardiorespiratory fitness, muscular endurance and strength as measured by increased METs and functional capacity (6MWT)      Able to understand and use rate of perceived exertion (RPE) scale Yes Yes      Intervention Provide education and explanation on how to use RPE scale Provide education and explanation on how to use RPE scale      Expected Outcomes Short Term: Able to use RPE daily in rehab to express subjective intensity level;Long Term:  Able to use RPE to guide intensity level when exercising independently Short Term: Able to use RPE daily in rehab to express subjective intensity level;Long Term:  Able to use RPE to guide intensity level when exercising independently      Able to understand and use Dyspnea scale Yes Yes      Intervention Provide education and explanation on how to use Dyspnea scale Provide education and explanation on how to use Dyspnea scale      Expected Outcomes Short Term: Able to use Dyspnea scale daily in rehab to express subjective sense of shortness of breath during exertion;Long Term: Able to use Dyspnea scale to guide intensity level when exercising independently Short Term: Able to use Dyspnea scale daily in rehab to express subjective sense of shortness of breath during exertion;Long Term: Able to use Dyspnea scale to guide intensity level when exercising independently      Knowledge and understanding of Target Heart Rate Range (THRR) Yes Yes      Intervention Provide education and explanation of THRR including how the numbers were predicted and where they are located for reference Provide education and explanation of THRR including how the numbers were predicted and where they are located for reference      Expected Outcomes Short Term: Able to state/look up THRR;Long Term: Able to use THRR to govern intensity  when exercising independently;Short Term: Able to use daily as guideline for intensity in rehab Short Term: Able to state/look up THRR;Short Term: Able to use daily as guideline for intensity in rehab;Long Term: Able to use THRR to govern intensity when exercising independently      Understanding of Exercise Prescription Yes Yes      Intervention Provide education, explanation, and written materials on patient's individual exercise prescription Provide education, explanation, and written materials on patient's individual exercise prescription      Expected Outcomes Short Term: Able to explain program exercise prescription;Long Term: Able to explain home exercise prescription to exercise independently Short Term: Able to explain program exercise prescription;Long Term: Able to explain home exercise prescription to exercise independently  Exercise Goals Re-Evaluation:  Exercise Goals Re-Evaluation     Row Name 06/02/21 1327 07/01/21 0931           Exercise Goal Re-Evaluation   Exercise Goals Review Increase Physical Activity;Increase Strength and Stamina;Able to understand and use rate of perceived exertion (RPE) scale;Able to understand and use Dyspnea scale;Knowledge and understanding of Target Heart Rate Range (THRR);Understanding of Exercise Prescription Increase Physical Activity;Increase Strength and Stamina;Able to understand and use rate of perceived exertion (RPE) scale;Able to understand and use Dyspnea scale;Knowledge and understanding of Target Heart Rate Range (THRR);Understanding of Exercise Prescription      Comments Zymire has completed 4 exercise sessions and has tolerated very well so far. He has been averaging 20 laps on the Track and is exercising at 2.6 METS on the Nustep. He is also able to do the warm up/cool down and resistance exercises with no issues. Will continue to monitor and progress as he is able. Juma has completed 12 exercise sessions and has  tolerated very well so far. He has been averaging 20 laps on the Track and has progressed to 3.1 METS on the Nustep. He is still able to do the warm up/cool down and resistance exercises with no issues. Will continue to monitor and progress as he is able.      Expected Outcomes Through exercise at rehab and home, the patient will decrease shortness of breath with daily activities and feel confident carrying out an exercise regimn at home. Through exercise at rehab and home, the patient will decrease shortness of breath with daily activities and feel confident carrying out an exercise regimn at home.               Nutrition & Weight - Outcomes:  Pre Biometrics - 05/12/21 1129       Pre Biometrics   Height 5\' 9"  (1.753 m)    Weight 78.3 kg    BMI (Calculated) 25.48    Grip Strength 24 kg             Post Biometrics - 07/17/21 1157        Post  Biometrics   Weight 79 kg    Grip Strength 22 kg             Nutrition:  Nutrition Therapy & Goals - 05/30/21 1030       Nutrition Therapy   Diet General Healthful      Personal Nutrition Goals   Nutrition Goal Pt to identify food quantities necessary to achieve weight gain of 3-5 lbs at graduation from pulmonary rehab.    Personal Goal #2 Pt to build a healthy plate including vegetables, fruits, whole grains, and low-fat dairy products in a heart healthy meal plan.    Personal Goal #3 Pt to reduce/eliminate alcohol      Intervention Plan   Intervention Prescribe, educate and counsel regarding individualized specific dietary modifications aiming towards targeted core components such as weight, hypertension, lipid management, diabetes, heart failure and other comorbidities.;Nutrition handout(s) given to patient.    Expected Outcomes Short Term Goal: A plan has been developed with personal nutrition goals set during dietitian appointment.;Short Term Goal: Understand basic principles of dietary content, such as calories, fat,  sodium, cholesterol and nutrients.             Nutrition Discharge:   Education Questionnaire Score:  Knowledge Questionnaire Score - 07/17/21 1259       Knowledge Questionnaire Score   Post Score 14/18  Goals reviewed with patient; copy given to patient.

## 2021-07-24 ENCOUNTER — Ambulatory Visit: Payer: PPO | Admitting: Internal Medicine

## 2021-07-25 ENCOUNTER — Ambulatory Visit: Payer: PPO | Admitting: Internal Medicine

## 2021-07-31 ENCOUNTER — Other Ambulatory Visit: Payer: Self-pay

## 2021-07-31 ENCOUNTER — Ambulatory Visit (INDEPENDENT_AMBULATORY_CARE_PROVIDER_SITE_OTHER): Payer: PPO | Admitting: Internal Medicine

## 2021-07-31 ENCOUNTER — Encounter: Payer: Self-pay | Admitting: Internal Medicine

## 2021-07-31 VITALS — BP 150/80 | HR 104 | Temp 99.1°F | Ht 69.0 in | Wt 168.0 lb

## 2021-07-31 DIAGNOSIS — I10 Essential (primary) hypertension: Secondary | ICD-10-CM

## 2021-07-31 DIAGNOSIS — Z23 Encounter for immunization: Secondary | ICD-10-CM

## 2021-07-31 DIAGNOSIS — E7800 Pure hypercholesterolemia, unspecified: Secondary | ICD-10-CM | POA: Diagnosis not present

## 2021-07-31 DIAGNOSIS — G4733 Obstructive sleep apnea (adult) (pediatric): Secondary | ICD-10-CM | POA: Diagnosis not present

## 2021-07-31 DIAGNOSIS — R7302 Impaired glucose tolerance (oral): Secondary | ICD-10-CM

## 2021-07-31 DIAGNOSIS — E559 Vitamin D deficiency, unspecified: Secondary | ICD-10-CM

## 2021-07-31 MED ORDER — LOSARTAN POTASSIUM 100 MG PO TABS: 100.0000 mg | ORAL_TABLET | Freq: Every day | ORAL | 3 refills | Status: DC

## 2021-07-31 NOTE — Patient Instructions (Addendum)
You had the flu shot today  Ok to increase the losartan to 100 mg per day  Please continue all other medications as before, and refills have been done if requested.  Please have the pharmacy call with any other refills you may need.  Please continue your efforts at being more active, low cholesterol diet, and weight control.  Please keep your appointments with your specialists as you may have planned  Please make an Appointment to return in 6 months, or sooner if needed

## 2021-07-31 NOTE — Progress Notes (Signed)
Patient ID: Ryan Diaz, male   DOB: February 03, 1946, 75 y.o.   MRN: [id number]        Chief Complaint: follow up HTN, HLD and low vit d        HPI:  Ryan Diaz is a 75 y.o. male here overall doing ok, Pt denies chest pain, increased sob or doe, wheezing, orthopnea, PND, increased LE swelling, palpitations, dizziness or syncope.   Pt denies polydipsia, polyuria, or new focal neuro s/s.   Pt denies fever, wt loss, night sweats, loss of appetite, or other constitutional symptoms No new complaints.  Has been taking VitD but not sure if 1000 or 2000 u.        Wt Readings from Last 3 Encounters:  07/31/21 168 lb (76.2 kg)  07/17/21 174 lb 2.6 oz (79 kg)  07/08/21 169 lb 5 oz (76.8 kg)   BP Readings from Last 3 Encounters:  07/31/21 (!) 150/80  06/13/21 (!) 142/80  05/13/21 (!) 162/86         Past Medical History:  Diagnosis Date   ABUSE, ALCOHOL, CONTINUOUS 08/08/2007   ALLERGIC RHINITIS 08/08/2007   ANXIETY 08/08/2007   Carpal tunnel syndrome    COPD 10/03/2009   DEPRESSION 08/08/2007   ERECTILE DYSFUNCTION 08/08/2007   GENITAL HERPES, HX OF 08/08/2007   GERD 08/08/2007   GLUCOSE INTOLERANCE 08/08/2007   HYPERLIPIDEMIA 08/08/2007   HYPERTENSION 08/08/2007   Impaired glucose tolerance 09/26/2011   Insomnia 05/18/2012   PAIN IN SOFT TISSUES OF LIMB 10/02/2009   Sleep apnea    OSA-uses CPAP nightly   Past Surgical History:  Procedure Laterality Date   ACHILLES TENDON REPAIR Bilateral    CARPAL TUNNEL RELEASE Right 08/18/2018   Procedure: RIGHT CARPAL TUNNEL RELEASE;  Surgeon: Betha Loa, MD;  Location: [facility];  Service: Orthopedics;  Laterality: Right;   CARPAL TUNNEL RELEASE Left 11/21/2018   Procedure: LEFT CARPAL TUNNEL RELEASE;  Surgeon: Betha Loa, MD;  Location: [facility];  Service: Orthopedics;  Laterality: Left;  Bier block   COLONOSCOPY     LEFT HEART CATH AND CORONARY ANGIOGRAPHY N/A 10/17/2020   Procedure: LEFT HEART CATH AND CORONARY  ANGIOGRAPHY;  Surgeon: Yvonne Kendall, MD;  Location: [facility];  Service: Cardiovascular;  Laterality: N/A;    reports that he quit smoking about 11 years ago. His smoking use included cigarettes. He has a 52.50 pack-year smoking history. He has never used smokeless tobacco. He reports current alcohol use of about 46.0 - 53.0 standard drinks per week. He reports current drug use. Drug: Marijuana. family history includes Colon polyps in his brother. Allergies  Allergen Reactions   Sildenafil Palpitations   Current Outpatient Medications on File Prior to Visit  Medication Sig Dispense Refill   albuterol (PROVENTIL HFA;VENTOLIN HFA) 108 (90 BASE) MCG/ACT inhaler Inhale 2 puffs into the lungs every 6 (six) hours as needed for wheezing or shortness of breath.     aspirin EC 81 MG tablet Take 81 mg by mouth daily.     atorvastatin (LIPITOR) 80 MG tablet Take 1 tablet (80 mg total) by mouth daily. 90 tablet 3   chlorhexidine (PERIDEX) 0.12 % solution      Cholecalciferol (VITAMIN D) 50 MCG (2000 UT) tablet Take 2,000 Units by mouth daily.     clotrimazole (LOTRIMIN) 1 % cream Apply 1 application topically 2 (two) times daily as needed (irritation).     docusate sodium (COLACE) 100 MG capsule Take 100 mg  by mouth daily as needed for mild constipation.     donepezil (ARICEPT) 5 MG tablet Take 1 tablet (5 mg total) by mouth at bedtime. 90 tablet 5   Ferrous Sulfate 27 MG TABS Take 27 mg by mouth daily as needed (pt prefrence).     fluocinonide ointment (LIDEX) 0.05 % Apply 1 application topically 2 (two) times daily. 30 g 2   fluticasone (CUTIVATE) 0.05 % cream Apply 1 application topically daily as needed (irritation).     Fluticasone-Umeclidin-Vilant (TRELEGY ELLIPTA) 100-62.5-25 MCG/INH AEPB Inhale 1 puff into the lungs daily. 60 each 11   Fluticasone-Umeclidin-Vilant (TRELEGY ELLIPTA) 200-62.5-25 MCG/INH AEPB Inhale 1 puff into the lungs daily. 60 each 5   Fluticasone-Umeclidin-Vilant  (TRELEGY ELLIPTA) 200-62.5-25 MCG/INH AEPB Inhale 1 puff into the lungs daily. 60 each 0   gabapentin (NEURONTIN) 100 MG capsule Take 1 capsule (100 mg total) by mouth 3 (three) times daily. (Patient taking differently: Take 100 mg by mouth 3 (three) times daily as needed (pain).) 90 capsule 5   ipratropium (ATROVENT) 0.06 % nasal spray Place 2 sprays into both nostrils 3 (three) times daily. 15 mL 5   meloxicam (MOBIC) 15 MG tablet 1 tab by mouth once daily as needed (Patient taking differently: Take 15 mg by mouth daily as needed for pain. 1 tab by mouth once daily as needed) 90 tablet 1   nitroGLYCERIN (NITROSTAT) 0.4 MG SL tablet Place 1 tablet (0.4 mg total) under the tongue every 5 (five) minutes as needed for chest pain. 25 tablet 4   Omega-3 Fatty Acids (FISH OIL) 1000 MG CAPS Take 1,000 mg by mouth 2 (two) times daily.     omeprazole (PRILOSEC) 20 MG capsule Take 1 capsule (20 mg total) by mouth 2 (two) times daily. (Patient taking differently: Take 20 mg by mouth 2 (two) times daily as needed (acid reflux).) 90 capsule 3   tadalafil (CIALIS) 20 MG tablet Take 20 mg by mouth daily as needed for erectile dysfunction.     terazosin (HYTRIN) 2 MG capsule Take 1 capsule (2 mg total) by mouth at bedtime. 90 capsule 3   traZODone (DESYREL) 50 MG tablet Take 50 mg by mouth at bedtime as needed for sleep.     No current facility-administered medications on file prior to visit.        ROS:  All others reviewed and negative.  Objective        PE:  BP (!) 150/80 (BP Location: Right Arm, Patient Position: Sitting, Cuff Size: Normal)   Pulse (!) 104   Temp 99.1 F (37.3 C) (Oral)   Ht 5\' 9"  (1.753 m)   Wt 168 lb (76.2 kg)   SpO2 95%   BMI 24.81 kg/m                 Constitutional: Pt appears in NAD               HENT: Head: NCAT.                Right Ear: External ear normal.                 Left Ear: External ear normal.                Eyes: . Pupils are equal, round, and reactive to  light. Conjunctivae and EOM are normal               Nose: without d/c or deformity  Neck: Neck supple. Gross normal ROM               Cardiovascular: Normal rate and regular rhythm.                 Pulmonary/Chest: Effort normal and breath sounds without rales or wheezing.                Abd:  Soft, NT, ND, + BS, no organomegaly               Neurological: Pt is alert. At baseline orientation, motor grossly intact               Skin: Skin is warm. No rashes, no other new lesions, LE edema - none               Psychiatric: Pt behavior is normal without agitation   Micro: none  Cardiac tracings I have personally interpreted today:  none  Pertinent Radiological findings (summarize): none   Lab Results  Component Value Date   WBC 6.0 05/13/2021   HGB 15.4 05/13/2021   HCT 45.5 05/13/2021   PLT 332.0 05/13/2021   GLUCOSE 84 05/13/2021   CHOL 165 05/13/2021   TRIG 203.0 (H) 05/13/2021   HDL 42.80 05/13/2021   LDLDIRECT 96.0 05/13/2021   LDLCALC 79 03/27/2020   ALT 27 05/13/2021   AST 23 05/13/2021   NA 138 05/13/2021   K 4.4 05/13/2021   CL 103 05/13/2021   CREATININE 1.20 05/13/2021   BUN 14 05/13/2021   CO2 25 05/13/2021   TSH 0.84 05/13/2021   PSA 1.00 05/13/2021   HGBA1C 5.2 05/13/2021   Assessment/Plan:  ELWOOD BAZINET is a 75 y.o. Black or African American [2] male with  has a past medical history of ABUSE, ALCOHOL, CONTINUOUS (08/08/2007), ALLERGIC RHINITIS (08/08/2007), ANXIETY (08/08/2007), Carpal tunnel syndrome, COPD (10/03/2009), DEPRESSION (08/08/2007), ERECTILE DYSFUNCTION (08/08/2007), GENITAL HERPES, HX OF (08/08/2007), GERD (08/08/2007), GLUCOSE INTOLERANCE (08/08/2007), HYPERLIPIDEMIA (08/08/2007), HYPERTENSION (08/08/2007), Impaired glucose tolerance (09/26/2011), Insomnia (05/18/2012), PAIN IN SOFT TISSUES OF LIMB (10/02/2009), and Sleep apnea.  Vitamin D deficiency Last vitamin D Lab Results  Component Value Date   VD25OH 32.85 05/13/2021   Low  normal, for increased oral replacement   Impaired glucose tolerance Lab Results  Component Value Date   HGBA1C 5.2 05/13/2021   Stable, pt to continue current medical treatment  - diet   HLD (hyperlipidemia) Lab Results  Component Value Date   LDLCALC 79 03/27/2020   Mild uncontrolled, goal ldl < 70, pt to continue current statin lipitor 80, declines add zetia or refer lipid clinic   Essential hypertension BP Readings from Last 3 Encounters:  07/31/21 (!) 150/80  06/13/21 (!) 142/80  05/13/21 (!) 162/86   uncontrolled, pt to continue medical treatment losartan at increased 100 mg doseing  Followup: No follow-ups on file.  Oliver Barre, MD 08/06/2021 8:41 PM [facility] [place] [facility] - [facility]

## 2021-08-06 NOTE — Assessment & Plan Note (Signed)
Last vitamin D Lab Results  Component Value Date   VD25OH 32.85 05/13/2021   Low normal, for increased oral replacement

## 2021-08-06 NOTE — Assessment & Plan Note (Addendum)
BP Readings from Last 3 Encounters:  07/31/21 (!) 150/80  06/13/21 (!) 142/80  05/13/21 (!) 162/86   uncontrolled, pt to continue medical treatment losartan at increased 100 mg doseing

## 2021-08-06 NOTE — Assessment & Plan Note (Signed)
Lab Results  Component Value Date   LDLCALC 79 03/27/2020   Mild uncontrolled, goal ldl < 70, pt to continue current statin lipitor 80, declines add zetia or refer lipid clinic

## 2021-08-06 NOTE — Assessment & Plan Note (Signed)
Lab Results  Component Value Date   HGBA1C 5.2 05/13/2021   Stable, pt to continue current medical treatment  - diet  

## 2021-08-18 ENCOUNTER — Telehealth: Payer: Self-pay | Admitting: Internal Medicine

## 2021-08-18 NOTE — Telephone Encounter (Signed)
1.Medication Requested: Cholecalciferol (VITAMIN D) 50 MCG (2000 UT) tablet  2. Pharmacy (Name, Street, [place]): [id number] - [place] ([place]), [place] - [address]  Phone:  [phone] Fax:  [phone]   3. On Med List: yes  4. Last Visit with PCP: 09.15.22  5. Next visit date with PCP: 03.15.23   Agent: Please be advised that RX refills may take up to 3 business days. We ask that you follow-up with your pharmacy.

## 2021-08-19 MED ORDER — VITAMIN D 50 MCG (2000 UT) PO TABS: 2000.0000 [IU] | ORAL_TABLET | Freq: Every day | ORAL | 3 refills | Status: AC

## 2021-09-02 ENCOUNTER — Telehealth: Payer: Self-pay | Admitting: Internal Medicine

## 2021-09-02 NOTE — Telephone Encounter (Signed)
Patient says he has a stye on his right eye  Patient wants provider to send something to pharmacy for this  Offered OV... patient requested message be sent to provider instead  Pharmacy:  [facility] [address] [place]), [place] - [address]  Phone:  [phone] Fax:  [phone]

## 2021-09-03 NOTE — Telephone Encounter (Signed)
Left message for patient to call me back. 

## 2021-09-03 NOTE — Telephone Encounter (Signed)
An actual stye is just a bump on the edge of the eyelid.   If the patient has redness or swelling to the eyelid, or the [place] part of the eye , this would be different.   A stye does not require specific treatment except warm compressess of 20 min three times per day to help bring it to a head.  If this is not a stye however, pt should have ROV

## 2021-09-05 NOTE — Telephone Encounter (Signed)
Second attempt to reach patient. Left message for patient to call me back

## 2021-09-09 ENCOUNTER — Telehealth: Payer: Self-pay | Admitting: Internal Medicine

## 2021-09-09 ENCOUNTER — Encounter: Payer: Self-pay | Admitting: Internal Medicine

## 2021-09-09 ENCOUNTER — Telehealth (INDEPENDENT_AMBULATORY_CARE_PROVIDER_SITE_OTHER): Payer: PPO | Admitting: Internal Medicine

## 2021-09-09 ENCOUNTER — Other Ambulatory Visit: Payer: Self-pay

## 2021-09-09 DIAGNOSIS — R509 Fever, unspecified: Secondary | ICD-10-CM

## 2021-09-09 NOTE — Telephone Encounter (Signed)
Ok for [employer] late today if pt wants

## 2021-09-09 NOTE — Telephone Encounter (Signed)
Left message for patient to call me back. 

## 2021-09-09 NOTE — Telephone Encounter (Signed)
Patient calling to state he has chills and a temperature of 104.00  Patient states the chills started on 09-08-2021  Patient states he took his temperature on 09-09-2021  Patient states he has not taking a Covid test  Advised patient to go to ed, patient declined  Transferred patient to [facility]

## 2021-09-09 NOTE — Progress Notes (Signed)
Virtual Visit via Audio Note  I connected with Ryan Diaz on 09/09/21 at  4:00 PM EDT by an audio-only enabled telemedicine application and verified that I am speaking with the correct person using two identifiers.  The patient and the provider were at separate locations throughout the entire encounter. Patient location: home, Provider location: work   I discussed the limitations of evaluation and management by telemedicine and the availability of in person appointments. The patient expressed understanding and agreed to proceed. The patient and the provider were the only parties present for the visit unless noted in HPI below.  History of Present Illness: The patient is a 75 y.o. man with visit for fever to 104F. Started Monday with chills. Feeling well today. No covid-19 testing done. Has kit but could not read directions.   Observations/Objective: A and O times 3, no cough or dyspnea  Assessment and Plan: See problem oriented charting  Follow Up Instructions: monitor for symptoms, asked him if he has anyone who could help him do the covid-19 test and let us know if positive he would qualify for antiviral therapy, he will check  Visit time 11 minutes in non-face to face communication with patient and coordination of care.  I discussed the assessment and treatment plan with the patient. The patient was provided an opportunity to ask questions and all were answered. The patient agreed with the plan and demonstrated an understanding of the instructions.   The patient was advised to call back or seek an in-person evaluation if the symptoms worsen or if the condition fails to improve as anticipated.  Hoyt Koch, MD

## 2021-09-10 NOTE — Telephone Encounter (Signed)
Pt. Called on 10.25.22 and states he has chills (started day before), slight HA and fever 102.0 orally - office advised him to go to the ER/ UC and he declined.  Pt. Was then connected to [facility] and was told to see HCP within 4 hours. Pt. Complied and was seen on 10.25.2022 at 4:00pm.

## 2021-09-23 ENCOUNTER — Telehealth: Payer: Self-pay | Admitting: Internal Medicine

## 2021-09-23 NOTE — Telephone Encounter (Signed)
Left message for patient to call me back at [phone] to schedule Medicare Annual Wellness Visit   Last AWV  07/26/20  Please schedule at anytime with [facility] Advisor if patient calls the office back.    40 Minutes appointment   Any questions, please call me at [phone]

## 2021-10-02 ENCOUNTER — Encounter: Payer: Self-pay | Admitting: Internal Medicine

## 2021-10-02 ENCOUNTER — Other Ambulatory Visit: Payer: Self-pay

## 2021-10-02 ENCOUNTER — Ambulatory Visit (INDEPENDENT_AMBULATORY_CARE_PROVIDER_SITE_OTHER): Payer: PPO | Admitting: Internal Medicine

## 2021-10-02 VITALS — BP 118/60 | HR 98 | Temp 99.3°F | Ht 69.0 in | Wt 170.2 lb

## 2021-10-02 DIAGNOSIS — I10 Essential (primary) hypertension: Secondary | ICD-10-CM | POA: Diagnosis not present

## 2021-10-02 DIAGNOSIS — L732 Hidradenitis suppurativa: Secondary | ICD-10-CM | POA: Diagnosis not present

## 2021-10-02 DIAGNOSIS — J449 Chronic obstructive pulmonary disease, unspecified: Secondary | ICD-10-CM

## 2021-10-02 DIAGNOSIS — H57813 Brow ptosis, bilateral: Secondary | ICD-10-CM | POA: Insufficient documentation

## 2021-10-02 MED ORDER — ALBUTEROL SULFATE 0.63 MG/3ML IN NEBU: 1.0000 | INHALATION_SOLUTION | Freq: Four times a day (QID) | RESPIRATORY_TRACT | 12 refills | Status: AC | PRN

## 2021-10-02 MED ORDER — CLINDAMYCIN HCL 300 MG PO CAPS: 300.0000 mg | ORAL_CAPSULE | Freq: Three times a day (TID) | ORAL | 0 refills | Status: DC

## 2021-10-02 NOTE — Patient Instructions (Signed)
Please take all new medication as prescribed - the antibiotic, and the nebulizer medication as needed  You are given the prescription for the nebulizer machine  Please continue all other medications as before, and refills have been done if requested.  Please have the pharmacy call with any other refills you may need.  Please continue your efforts at being more active, low cholesterol diet, and weight control.  Please keep your appointments with your specialists as you may have planned

## 2021-10-02 NOTE — Progress Notes (Signed)
Patient ID: Ryan Diaz, male   DOB: [birth year], 75 y.o.   MRN: [id number]        Chief Complaint: follow up copd. dm. left hidradenitis       HPI:  Ryan Diaz is a 75 y.o. male here with c/o left axillary tender knots and vague swelling in the area without overlying skin change, fever, chills, or drainage.  Pt denies chest pain, wheezing, orthopnea, PND, increased LE swelling, palpitations, dizziness or syncope, but does have mild worsening sob/doe and run out of inhaler.   Pt denies polydipsia, polyuria, or new focal neuro s/s.   Pt denies fever, wt loss, night sweats, loss of appetite, or other constitutional symptoms        Wt Readings from Last 3 Encounters:  10/02/21 170 lb 3.2 oz (77.2 kg)  07/31/21 168 lb (76.2 kg)  07/17/21 174 lb 2.6 oz (79 kg)   BP Readings from Last 3 Encounters:  10/02/21 118/60  07/31/21 (!) 150/80  06/13/21 (!) 142/80         Past Medical History:  Diagnosis Date   ABUSE, ALCOHOL, CONTINUOUS 08/08/2007   ALLERGIC RHINITIS 08/08/2007   ANXIETY 08/08/2007   Carpal tunnel syndrome    COPD 10/03/2009   DEPRESSION 08/08/2007   ERECTILE DYSFUNCTION 08/08/2007   GENITAL HERPES, HX OF 08/08/2007   GERD 08/08/2007   GLUCOSE INTOLERANCE 08/08/2007   HYPERLIPIDEMIA 08/08/2007   HYPERTENSION 08/08/2007   Impaired glucose tolerance 09/26/2011   Insomnia 05/18/2012   PAIN IN SOFT TISSUES OF LIMB 10/02/2009   Sleep apnea    OSA-uses CPAP nightly   Past Surgical History:  Procedure Laterality Date   ACHILLES TENDON REPAIR Bilateral    CARPAL TUNNEL RELEASE Right 08/18/2018   Procedure: RIGHT CARPAL TUNNEL RELEASE;  Surgeon: Betha Loa, MD;  Location: [facility];  Service: Orthopedics;  Laterality: Right;   CARPAL TUNNEL RELEASE Left 11/21/2018   Procedure: LEFT CARPAL TUNNEL RELEASE;  Surgeon: Betha Loa, MD;  Location: [facility];  Service: Orthopedics;  Laterality: Left;  Bier block   COLONOSCOPY     LEFT HEART CATH AND CORONARY  ANGIOGRAPHY N/A 10/17/2020   Procedure: LEFT HEART CATH AND CORONARY ANGIOGRAPHY;  Surgeon: Yvonne Kendall, MD;  Location: [facility];  Service: Cardiovascular;  Laterality: N/A;    reports that he quit smoking about 12 years ago. His smoking use included cigarettes. He has a 52.50 pack-year smoking history. He has never used smokeless tobacco. He reports current alcohol use of about 46.0 - 53.0 standard drinks per week. He reports current drug use. Drug: Marijuana. family history includes Colon polyps in his brother. Allergies  Allergen Reactions   Sildenafil Palpitations   Current Outpatient Medications on File Prior to Visit  Medication Sig Dispense Refill   albuterol (PROVENTIL HFA;VENTOLIN HFA) 108 (90 BASE) MCG/ACT inhaler Inhale 2 puffs into the lungs every 6 (six) hours as needed for wheezing or shortness of breath.     aspirin EC 81 MG tablet Take 81 mg by mouth daily.     atorvastatin (LIPITOR) 80 MG tablet Take 1 tablet (80 mg total) by mouth daily. 90 tablet 3   chlorhexidine (PERIDEX) 0.12 % solution      Cholecalciferol (VITAMIN D) 50 MCG (2000 UT) tablet Take 1 tablet (2,000 Units total) by mouth daily. 90 tablet 3   clotrimazole (LOTRIMIN) 1 % cream Apply 1 application topically 2 (two) times daily as needed (irritation).  docusate sodium (COLACE) 100 MG capsule Take 100 mg by mouth daily as needed for mild constipation.     donepezil (ARICEPT) 5 MG tablet Take 1 tablet (5 mg total) by mouth at bedtime. 90 tablet 5   Ferrous Sulfate 27 MG TABS Take 27 mg by mouth daily as needed (pt prefrence).     fluocinonide ointment (LIDEX) 0.05 % Apply 1 application topically 2 (two) times daily. 30 g 2   fluticasone (CUTIVATE) 0.05 % cream Apply 1 application topically daily as needed (irritation).     Fluticasone-Umeclidin-Vilant (TRELEGY ELLIPTA) 100-62.5-25 MCG/INH AEPB Inhale 1 puff into the lungs daily. 60 each 11   Fluticasone-Umeclidin-Vilant (TRELEGY ELLIPTA)  200-62.5-25 MCG/INH AEPB Inhale 1 puff into the lungs daily. 60 each 5   Fluticasone-Umeclidin-Vilant (TRELEGY ELLIPTA) 200-62.5-25 MCG/INH AEPB Inhale 1 puff into the lungs daily. 60 each 0   gabapentin (NEURONTIN) 100 MG capsule Take 1 capsule (100 mg total) by mouth 3 (three) times daily. (Patient taking differently: Take 100 mg by mouth 3 (three) times daily as needed (pain).) 90 capsule 5   ipratropium (ATROVENT) 0.06 % nasal spray Place 2 sprays into both nostrils 3 (three) times daily. 15 mL 5   losartan (COZAAR) 100 MG tablet Take 1 tablet (100 mg total) by mouth daily. 90 tablet 3   meloxicam (MOBIC) 15 MG tablet 1 tab by mouth once daily as needed (Patient taking differently: Take 15 mg by mouth daily as needed for pain. 1 tab by mouth once daily as needed) 90 tablet 1   nitroGLYCERIN (NITROSTAT) 0.4 MG SL tablet Place 1 tablet (0.4 mg total) under the tongue every 5 (five) minutes as needed for chest pain. 25 tablet 4   Omega-3 Fatty Acids (FISH OIL) 1000 MG CAPS Take 1,000 mg by mouth 2 (two) times daily.     omeprazole (PRILOSEC) 20 MG capsule Take 1 capsule (20 mg total) by mouth 2 (two) times daily. (Patient taking differently: Take 20 mg by mouth 2 (two) times daily as needed (acid reflux).) 90 capsule 3   tadalafil (CIALIS) 20 MG tablet Take 20 mg by mouth daily as needed for erectile dysfunction.     terazosin (HYTRIN) 2 MG capsule Take 1 capsule (2 mg total) by mouth at bedtime. 90 capsule 3   traZODone (DESYREL) 50 MG tablet Take 50 mg by mouth at bedtime as needed for sleep.     No current facility-administered medications on file prior to visit.        ROS:  All others reviewed and negative.  Objective        PE:  BP 118/60 (BP Location: Right Arm, Patient Position: Sitting, Cuff Size: Large)   Pulse 98   Temp 99.3 F (37.4 C) (Oral)   Ht 5\' 9"  (1.753 m)   Wt 170 lb 3.2 oz (77.2 kg)   SpO2 95%   BMI 25.13 kg/m                 Constitutional: Pt appears in NAD                HENT: Head: NCAT.                Right Ear: External ear normal.                 Left Ear: External ear normal.                Eyes: . Pupils are equal, round, and reactive  to light. Conjunctivae and EOM are normal               Nose: without d/c or deformity               Neck: Neck supple. Gross normal ROM               Left axilla with several tender sub1 nodular mass < 1 cm with nondiscreten 6 cm surrounding swelling               Cardiovascular: Normal rate and regular rhythm.                 Pulmonary/Chest: Effort normal and breath sounds decreased without rales or wheezing.                Abd:  Soft, NT, ND, + BS, no organomegaly               Neurological: Pt is alert. At baseline orientation, motor grossly intact               Skin: Skin is warm. No rashes, no other new lesions, LE edema - none               Psychiatric: Pt behavior is normal without agitation   Micro: none  Cardiac tracings I have personally interpreted today:  none  Pertinent Radiological findings (summarize): none   Lab Results  Component Value Date   WBC 6.0 05/13/2021   HGB 15.4 05/13/2021   HCT 45.5 05/13/2021   PLT 332.0 05/13/2021   GLUCOSE 84 05/13/2021   CHOL 165 05/13/2021   TRIG 203.0 (H) 05/13/2021   HDL 42.80 05/13/2021   LDLDIRECT 96.0 05/13/2021   LDLCALC 79 03/27/2020   ALT 27 05/13/2021   AST 23 05/13/2021   NA 138 05/13/2021   K 4.4 05/13/2021   CL 103 05/13/2021   CREATININE 1.20 05/13/2021   BUN 14 05/13/2021   CO2 25 05/13/2021   TSH 0.84 05/13/2021   PSA 1.00 05/13/2021   HGBA1C 5.2 05/13/2021   Assessment/Plan:  Ryan Diaz is a 75 y.o. Black or African American [2] male with  has a past medical history of ABUSE, ALCOHOL, CONTINUOUS (08/08/2007), ALLERGIC RHINITIS (08/08/2007), ANXIETY (08/08/2007), Carpal tunnel syndrome, COPD (10/03/2009), DEPRESSION (08/08/2007), ERECTILE DYSFUNCTION (08/08/2007), GENITAL HERPES, HX OF (08/08/2007), GERD (08/08/2007), GLUCOSE  INTOLERANCE (08/08/2007), HYPERLIPIDEMIA (08/08/2007), HYPERTENSION (08/08/2007), Impaired glucose tolerance (09/26/2011), Insomnia (05/18/2012), PAIN IN SOFT TISSUES OF LIMB (10/02/2009), and Sleep apnea.  Chronic obstructive lung disease (HCC) With mild worsening, for albuterol hfa prn, and nebs prn,  to f/u any worsening symptoms or concerns   Essential hypertension Stable and improved -  BP Readings from Last 3 Encounters:  10/02/21 118/60  07/31/21 (!) 150/80  06/13/21 (!) 142/80   Pt to continue losartan 100  Hidradenitis Mild to mod, for antibx course,  to f/u any worsening symptoms or concerns  Followup: Return in about 4 months (around 01/28/2022).  Oliver Barre, MD 10/04/2021 10:09 PM [facility] [place] [facility] - [facility] Medicine

## 2021-10-04 ENCOUNTER — Encounter: Payer: Self-pay | Admitting: Internal Medicine

## 2021-10-04 DIAGNOSIS — L732 Hidradenitis suppurativa: Secondary | ICD-10-CM | POA: Insufficient documentation

## 2021-10-04 NOTE — Assessment & Plan Note (Signed)
Stable and improved -  BP Readings from Last 3 Encounters:  10/02/21 118/60  07/31/21 (!) 150/80  06/13/21 (!) 142/80   Pt to continue losartan 100

## 2021-10-04 NOTE — Assessment & Plan Note (Signed)
Mild to mod, for antibx course,  to f/u any worsening symptoms or concerns 

## 2021-10-04 NOTE — Assessment & Plan Note (Signed)
With mild worsening, for albuterol hfa prn, and nebs prn,  to f/u any worsening symptoms or concerns

## 2021-10-07 ENCOUNTER — Other Ambulatory Visit: Payer: Self-pay

## 2021-10-07 ENCOUNTER — Ambulatory Visit (INDEPENDENT_AMBULATORY_CARE_PROVIDER_SITE_OTHER): Payer: PPO

## 2021-10-07 ENCOUNTER — Telehealth: Payer: Self-pay | Admitting: Internal Medicine

## 2021-10-07 VITALS — BP 132/80 | HR 80 | Temp 98.1°F | Ht 69.0 in | Wt 168.0 lb

## 2021-10-07 DIAGNOSIS — Z0000 Encounter for general adult medical examination without abnormal findings: Secondary | ICD-10-CM | POA: Diagnosis not present

## 2021-10-07 NOTE — Progress Notes (Signed)
Subjective:   Ryan Diaz is a 75 y.o. male who presents for Medicare Annual/Subsequent preventive examination.  Review of Systems     Cardiac Risk Factors include: advanced age (>25men, >2 women);dyslipidemia;hypertension;male gender     Objective:    Today's Vitals   10/07/21 1102  BP: 132/80  Pulse: 80  Temp: 98.1 F (36.7 C)  SpO2: 97%  Weight: 168 lb (76.2 kg)  Height: 5\' 9"  (1.753 m)  PainSc: 0-No pain   Body mass index is 24.81 kg/m.  Advanced Directives 10/07/2021 05/12/2021 10/17/2020 08/23/2020 07/26/2020 02/01/2019 11/07/2018  Does Patient Have a Medical Advance Directive? No No No No No No No  Would patient like information on creating a medical advance directive? Yes (MAU/Ambulatory/Procedural Areas - Information given) - No - Patient declined No - Patient declined Yes (MAU/Ambulatory/Procedural Areas - Information given) Yes (ED - Information included in AVS) No - Patient declined    Current Medications (verified) Outpatient Encounter Medications as of 10/07/2021  Medication Sig   albuterol (ACCUNEB) 0.63 MG/3ML nebulizer solution Take 3 mLs (0.63 mg total) by nebulization every 6 (six) hours as needed for wheezing.   albuterol (PROVENTIL HFA;VENTOLIN HFA) 108 (90 BASE) MCG/ACT inhaler Inhale 2 puffs into the lungs every 6 (six) hours as needed for wheezing or shortness of breath.   aspirin EC 81 MG tablet Take 81 mg by mouth daily.   atorvastatin (LIPITOR) 80 MG tablet Take 1 tablet (80 mg total) by mouth daily.   chlorhexidine (PERIDEX) 0.12 % solution    Cholecalciferol (VITAMIN D) 50 MCG (2000 UT) tablet Take 1 tablet (2,000 Units total) by mouth daily.   clindamycin (CLEOCIN) 300 MG capsule Take 1 capsule (300 mg total) by mouth 3 (three) times daily.   clotrimazole (LOTRIMIN) 1 % cream Apply 1 application topically 2 (two) times daily as needed (irritation).   docusate sodium (COLACE) 100 MG capsule Take 100 mg by mouth daily as needed for mild  constipation.   donepezil (ARICEPT) 5 MG tablet Take 1 tablet (5 mg total) by mouth at bedtime.   Ferrous Sulfate 27 MG TABS Take 27 mg by mouth daily as needed (pt prefrence).   fluocinonide ointment (LIDEX) 0.05 % Apply 1 application topically 2 (two) times daily.   fluticasone (CUTIVATE) 0.05 % cream Apply 1 application topically daily as needed (irritation).   Fluticasone-Umeclidin-Vilant (TRELEGY ELLIPTA) 100-62.5-25 MCG/INH AEPB Inhale 1 puff into the lungs daily.   Fluticasone-Umeclidin-Vilant (TRELEGY ELLIPTA) 200-62.5-25 MCG/INH AEPB Inhale 1 puff into the lungs daily.   Fluticasone-Umeclidin-Vilant (TRELEGY ELLIPTA) 200-62.5-25 MCG/INH AEPB Inhale 1 puff into the lungs daily.   gabapentin (NEURONTIN) 100 MG capsule Take 1 capsule (100 mg total) by mouth 3 (three) times daily. (Patient taking differently: Take 100 mg by mouth 3 (three) times daily as needed (pain).)   ipratropium (ATROVENT) 0.06 % nasal spray Place 2 sprays into both nostrils 3 (three) times daily.   losartan (COZAAR) 100 MG tablet Take 1 tablet (100 mg total) by mouth daily.   meloxicam (MOBIC) 15 MG tablet 1 tab by mouth once daily as needed (Patient taking differently: Take 15 mg by mouth daily as needed for pain. 1 tab by mouth once daily as needed)   nitroGLYCERIN (NITROSTAT) 0.4 MG SL tablet Place 1 tablet (0.4 mg total) under the tongue every 5 (five) minutes as needed for chest pain.   Omega-3 Fatty Acids (FISH OIL) 1000 MG CAPS Take 1,000 mg by mouth 2 (two) times daily.   omeprazole (  PRILOSEC) 20 MG capsule Take 1 capsule (20 mg total) by mouth 2 (two) times daily. (Patient taking differently: Take 20 mg by mouth 2 (two) times daily as needed (acid reflux).)   tadalafil (CIALIS) 20 MG tablet Take 20 mg by mouth daily as needed for erectile dysfunction.   terazosin (HYTRIN) 2 MG capsule Take 1 capsule (2 mg total) by mouth at bedtime.   traZODone (DESYREL) 50 MG tablet Take 50 mg by mouth at bedtime as needed for  sleep.   No facility-administered encounter medications on file as of 10/07/2021.    Allergies (verified) Sildenafil   History: Past Medical History:  Diagnosis Date   ABUSE, ALCOHOL, CONTINUOUS 08/08/2007   ALLERGIC RHINITIS 08/08/2007   ANXIETY 08/08/2007   Carpal tunnel syndrome    COPD 10/03/2009   DEPRESSION 08/08/2007   ERECTILE DYSFUNCTION 08/08/2007   GENITAL HERPES, HX OF 08/08/2007   GERD 08/08/2007   GLUCOSE INTOLERANCE 08/08/2007   HYPERLIPIDEMIA 08/08/2007   HYPERTENSION 08/08/2007   Impaired glucose tolerance 09/26/2011   Insomnia 05/18/2012   PAIN IN SOFT TISSUES OF LIMB 10/02/2009   Sleep apnea    OSA-uses CPAP nightly   Past Surgical History:  Procedure Laterality Date   ACHILLES TENDON REPAIR Bilateral    CARPAL TUNNEL RELEASE Right 08/18/2018   Procedure: RIGHT CARPAL TUNNEL RELEASE;  Surgeon: Betha Loa, MD;  Location: [facility];  Service: Orthopedics;  Laterality: Right;   CARPAL TUNNEL RELEASE Left 11/21/2018   Procedure: LEFT CARPAL TUNNEL RELEASE;  Surgeon: Betha Loa, MD;  Location: [facility];  Service: Orthopedics;  Laterality: Left;  Bier block   COLONOSCOPY     LEFT HEART CATH AND CORONARY ANGIOGRAPHY N/A 10/17/2020   Procedure: LEFT HEART CATH AND CORONARY ANGIOGRAPHY;  Surgeon: Yvonne Kendall, MD;  Location: [facility];  Service: Cardiovascular;  Laterality: N/A;   Family History  Problem Relation Age of Onset   Colon polyps Brother    Social History   Socioeconomic History   Marital status: Single    Spouse name: Not on file   Number of children: Not on file   Years of education: Not on file   Highest education level: Not on file  Occupational History   Occupation: self employed auto body work  Tobacco Use   Smoking status: Former    Packs/day: 1.50    Years: 35.00    Pack years: 52.50    Types: Cigarettes    Quit date: 09/04/2009    Years since quitting: 12.0   Smokeless tobacco: Never  Vaping  Use   Vaping Use: Never used  Substance and Sexual Activity   Alcohol use: Yes    Alcohol/week: 46.0 - 53.0 standard drinks    Types: 28 - 35 Cans of beer, 18 Standard drinks or equivalent per week    Comment: everyday drinker amount varies    Drug use: Yes    Types: Marijuana    Comment: marijuana occassionally   Sexual activity: Yes  Other Topics Concern   Not on file  Social History Narrative   Not on file   Social Determinants of Health   Financial Resource Strain: Low Risk    Difficulty of Paying Living Expenses: Not hard at all  Food Insecurity: No Food Insecurity   Worried About Programme researcher, broadcasting/film/video in the Last Year: Never true   Ran Out of Food in the Last Year: Never true  Transportation Needs: No Transportation Needs   Lack  of Transportation (Medical): No   Lack of Transportation (Non-Medical): No  Physical Activity: Sufficiently Active   Days of Exercise per Week: 5 days   Minutes of Exercise per Session: 30 min  Stress: No Stress Concern Present   Feeling of Stress : Not at all  Social Connections: Moderately Integrated   Frequency of Communication with Friends and Family: More than three times a week   Frequency of Social Gatherings with Friends and Family: More than three times a week   Attends Religious Services: 1 to 4 times per year   Active Member of [employer] or Organizations: No   Attends Engineer, structural: 1 to 4 times per year   Marital Status: Divorced    Tobacco Counseling Counseling given: Not Answered   Clinical Intake:  Pre-visit preparation completed: Yes  Pain : No/denies pain Pain Score: 0-No pain     BMI - recorded: 24.81 Nutritional Status: BMI of 19-24  Normal Nutritional Risks: None Diabetes: No  How often do you need to have someone help you when you read instructions, pamphlets, or other written materials from your doctor or pharmacy?: 1 - Never What is the last grade level you completed in school?: Bachelor's  Degree  Diabetic? no  Interpreter Needed?: No  Information entered by :: Susie Cassette, LPN   Activities of Daily Living In your present state of health, do you have any difficulty performing the following activities: 10/07/2021  Hearing? N  Vision? N  Difficulty concentrating or making decisions? Y  Walking or climbing stairs? N  Dressing or bathing? N  Doing errands, shopping? N  Preparing Food and eating ? N  Using the Toilet? N  In the past six months, have you accidently leaked urine? N  Do you have problems with loss of bowel control? N  Managing your Medications? N  Managing your Finances? N  Housekeeping or managing your Housekeeping? N  Some recent data might be hidden    Patient Care Team: Corwin Levins, MD as PCP - General Anne Fu Veverly Fells, MD as PCP - Cardiology (Cardiology) Waymon Budge, MD as Consulting Physician (Pulmonary Disease)  Indicate any recent Medical Services you may have received from other than [facility] providers in the past year (date may be approximate).     Assessment:   This is a routine wellness examination for [place].  Hearing/Vision screen Hearing Screening - Comments:: Patient declined any hearing difficulty. No hearing aids. Vision Screening - Comments:: Patient wears eyeglasses. [facility] exam [facility] at [facility].  Dietary issues and exercise activities discussed: Current Exercise Habits: Home exercise routine, Time (Minutes): 30, Frequency (Times/Week): 5, Weekly Exercise (Minutes/Week): 150, Intensity: Mild, Exercise limited by: respiratory conditions(s)   Goals Addressed               This Visit's Progress     Patient Stated (pt-stated)        Patient declined health goal at this time.      Depression Screen PHQ 2/9 Scores 10/07/2021 07/17/2021 06/13/2021 05/13/2021 05/12/2021 10/03/2020 07/26/2020  PHQ - 2 Score 0 0 0 0 0 0 0  PHQ- 9 Score - 0 - - 3 - -    Fall Risk Fall Risk  10/07/2021 06/13/2021 05/13/2021 05/12/2021  10/03/2020  Falls in the past year? 1 0 0 0 0  Comment - - - - -  Number falls in past yr: 0 0 0 - -  Comment - - - - -  Injury with Fall? 0  0 0 - -  Risk for fall due to : - - - No Fall Risks -  Follow up - - - Falls prevention discussed -    FALL RISK PREVENTION PERTAINING TO THE HOME:  Any stairs in or around the home? No  If so, are there any without handrails? No  Home free of loose throw rugs in walkways, pet beds, electrical cords, etc? Yes  Adequate lighting in your home to reduce risk of falls? Yes   ASSISTIVE DEVICES UTILIZED TO PREVENT FALLS:  Life alert? No  Use of a cane, walker or w/c? No  Grab bars in the bathroom? Yes  Shower chair or bench in shower? No  Elevated toilet seat or a handicapped toilet? No   TIMED UP AND GO:  Was the test performed? Yes .  Length of time to ambulate 10 feet: 6 sec.   Gait steady and fast without use of assistive device  Cognitive Function: Normal cognitive status assessed by direct observation by this Nurse Health Advisor. No abnormalities found.   MMSE - Mini Mental State Exam 02/01/2019 12/18/2016  Orientation to time 5 5  Orientation to Place 5 5  Registration 3 3  Attention/ Calculation 5 5  Recall 3 3  Language- name 2 objects 2 2  Language- repeat 1 1  Language- follow 3 step command 3 3  Language- read & follow direction 1 1  Write a sentence 1 1  Copy design 1 1  Total score 30 30     6CIT Screen 07/26/2020  What Year? 0 points  What month? 0 points  What time? 0 points  Count back from 20 0 points  Months in reverse 0 points  Repeat phrase 2 points  Total Score 2    Immunizations Immunization History  Administered Date(s) Administered   Fluad Quad(high Dose 65+) 08/04/2019, 07/26/2020, 07/31/2021   H1N1 09/16/2008   Influenza Split 08/27/2013, 07/28/2017, 09/16/2018   Influenza Whole 08/17/2005, 09/16/2009   Influenza, High Dose Seasonal PF 08/18/2017   Influenza, Seasonal, Injecte, Preservative Fre  08/12/2010, 09/07/2011, 09/15/2012, 08/29/2013   Influenza,inj,Quad PF,6+ Mos 07/27/2016   Influenza,trivalent, recombinat, inj, PF 08/27/2018   Influenza-Unspecified 07/17/2006, 07/18/2007, 08/16/2008, 09/18/2009, 10/04/2014, 10/17/2015, 09/16/2016, 08/23/2017, 08/16/2018, 09/16/2018, 08/04/2019, 07/26/2020   PFIZER Comirnaty(Gray Top)Covid-19 Tri-Sucrose Vaccine 03/13/2021   PFIZER(Purple Top)SARS-COV-2 Vaccination 12/09/2019, 12/30/2019, 08/13/2020   Pneumococcal Conjugate-13 04/10/2014, 06/27/2014   Pneumococcal Polysaccharide-23 08/19/2006, 05/11/2007, 09/15/2012, 03/09/2018   Td 01/01/2017   Tdap 05/11/2007   Zoster Recombinat (Shingrix) 01/04/2018, 04/04/2018   Zoster, Live 08/19/2006, 01/01/2012    TDAP status: Up to date  Flu Vaccine status: Up to date  Pneumococcal vaccine status: Up to date  Covid-19 vaccine status: Completed vaccines  Qualifies for Shingles Vaccine? Yes   Zostavax completed Yes   Shingrix Completed?: Yes  Screening Tests Health Maintenance  Topic Date Due   COVID-19 Vaccine (5 - Booster for Pfizer series) 05/08/2021   COLONOSCOPY (Pts 45-103yrs Insurance coverage will need to be confirmed)  12/22/2021   TETANUS/TDAP  01/01/2027   Pneumonia Vaccine 40+ Years old  Completed   INFLUENZA VACCINE  Completed   Hepatitis C Screening  Completed   Zoster Vaccines- Shingrix  Completed   HPV VACCINES  Aged Out    Health Maintenance  Health Maintenance Due  Topic Date Due   COVID-19 Vaccine (5 - Booster for Pfizer series) 05/08/2021    Colorectal cancer screening: Type of screening: Colonoscopy. Completed 12/23/2011. Repeat every 10 years  Lung Cancer  Screening: (Low Dose CT Chest recommended if Age 12-80 years, 30 pack-year currently smoking OR have quit w/in 15years.) does not qualify.   Lung Cancer Screening Referral: No, already a patient of pulmonology  Additional Screening:  Hepatitis C Screening: does qualify; Completed yes  Vision  Screening: Recommended annual ophthalmology exams for early detection of glaucoma and other disorders of the eye. Is the patient up to date with their annual eye exam?  Yes  Who is the provider or what is the name of the office in which the patient attends annual eye exams? [facility] If pt is not established with a provider, would they like to be referred to a provider to establish care? No .   Dental Screening: Recommended annual dental exams for proper oral hygiene  Community Resource Referral / Chronic Care Management: CRR required this visit?  No   CCM required this visit?  No      Plan:     I have personally reviewed and noted the following in the patient's chart:   Medical and social history Use of alcohol, tobacco or illicit drugs  Current medications and supplements including opioid prescriptions. Patient is not currently taking opioid prescriptions. Functional ability and status Nutritional status Physical activity Advanced directives List of other physicians Hospitalizations, surgeries, and ER visits in previous 12 months Vitals Screenings to include cognitive, depression, and falls Referrals and appointments  In addition, I have reviewed and discussed with patient certain preventive protocols, quality metrics, and best practice recommendations. A written personalized care plan for preventive services as well as general preventive health recommendations were provided to patient.     Mickeal Needy, LPN   16/08/[id number]   Nurse Notes:  Hearing Screening - Comments:: Patient declined any hearing difficulty. No hearing aids. Vision Screening - Comments:: Patient wears eyeglasses. [facility] exam done at [facility].

## 2021-10-07 NOTE — Patient Instructions (Signed)
[name] , Thank you for taking time to come for your Medicare Wellness Visit. I appreciate your ongoing commitment to your health goals. Please review the following plan we discussed and let me know if I can assist you in the future.   Screening recommendations/referrals: Colonoscopy: 12/23/2011; due every 10 years (due 12/2021) Recommended yearly ophthalmology/optometry visit for glaucoma screening and checkup Recommended yearly dental visit for hygiene and checkup  Vaccinations: Influenza vaccine: 07/31/2021 Pneumococcal vaccine: 06/27/2014, 03/09/2018 Tdap vaccine: 01/01/2017; due every 10 years (due 12/2026) Shingles vaccine: 01/04/2018, 04/04/2018   Covid-19: 12/09/2019, 12/30/2019, 08/13/2020, 03/13/2021, 09/10/2021  Advanced directives: Advance directive discussed with you today. I have provided a copy for you to complete at home and have notarized. Once this is complete please bring a copy in to our office so we can scan it into your chart.   Conditions/risks identified: Yes; Client understands the importance of follow-up with providers by attending scheduled visits and discussed goals to eat healthier, increase physical activity, exercise the brain, socialize more, get enough sleep and make time for laughter.  Next appointment: Please schedule your next Medicare Wellness Visit with your Nurse Health Advisor in 1 year by calling [phone].  Preventive Care 32 Years and Older, Male Preventive care refers to lifestyle choices and visits with your health care provider that can promote health and wellness. What does preventive care include? A yearly physical exam. This is also called an annual well check. Dental exams once or twice a year. Routine eye exams. Ask your health care provider how often you should have your eyes checked. Personal lifestyle choices, including: Daily care of your teeth and gums. Regular physical activity. Eating a healthy diet. Avoiding tobacco and drug  use. Limiting alcohol use. Practicing safe sex. Taking low doses of aspirin every day. Taking vitamin and mineral supplements as recommended by your health care provider. What happens during an annual well check? The services and screenings done by your health care provider during your annual well check will depend on your age, overall health, lifestyle risk factors, and family history of disease. Counseling  Your health care provider may ask you questions about your: Alcohol use. Tobacco use. Drug use. Emotional well-being. Home and relationship well-being. Sexual activity. Eating habits. History of falls. Memory and ability to understand (cognition). Work and work Astronomer. Screening  You may have the following tests or measurements: Height, weight, and BMI. Blood pressure. Lipid and cholesterol levels. These may be checked every 5 years, or more frequently if you are over 68 years old. Skin check. Lung cancer screening. You may have this screening every year starting at age 52 if you have a 30-pack-year history of smoking and currently smoke or have quit within the past 15 years. Fecal occult blood test (FOBT) of the stool. You may have this test every year starting at age 5. Flexible sigmoidoscopy or colonoscopy. You may have a sigmoidoscopy every 5 years or a colonoscopy every 10 years starting at age 56. Prostate cancer screening. Recommendations will vary depending on your family history and other risks. Hepatitis C blood test. Hepatitis B blood test. Sexually transmitted disease (STD) testing. Diabetes screening. This is done by checking your blood sugar (glucose) after you have not eaten for a while (fasting). You may have this done every 1-3 years. Abdominal aortic aneurysm (AAA) screening. You may need this if you are a current or former smoker. Osteoporosis. You may be screened starting at age 90 if you are at high risk. Talk  with your health care provider about  your test results, treatment options, and if necessary, the need for more tests. Vaccines  Your health care provider may recommend certain vaccines, such as: Influenza vaccine. This is recommended every year. Tetanus, diphtheria, and acellular pertussis (Tdap, Td) vaccine. You may need a Td booster every 10 years. Zoster vaccine. You may need this after age 81. Pneumococcal 13-valent conjugate (PCV13) vaccine. One dose is recommended after age 59. Pneumococcal polysaccharide (PPSV23) vaccine. One dose is recommended after age 29. Talk to your health care provider about which screenings and vaccines you need and how often you need them. This information is not intended to replace advice given to you by your health care provider. Make sure you discuss any questions you have with your health care provider. Document Released: 11/29/2015 Document Revised: 07/22/2016 Document Reviewed: 09/03/2015 Elsevier Interactive Patient Education  2017 [place] Prevention in the Home Falls can cause injuries. They can happen to people of all ages. There are many things you can do to make your home safe and to help prevent falls. What can I do on the outside of my home? Regularly fix the edges of walkways and driveways and fix any cracks. Remove anything that might make you trip as you walk through a door, such as a raised step or threshold. Trim any bushes or trees on the path to your home. Use bright outdoor lighting. Clear any walking paths of anything that might make someone trip, such as rocks or tools. Regularly check to see if handrails are loose or broken. Make sure that both sides of any steps have handrails. Any raised decks and porches should have guardrails on the edges. Have any leaves, snow, or ice cleared regularly. Use sand or salt on walking paths during winter. Clean up any spills in your garage right away. This includes oil or grease spills. What can I do in the bathroom? Use  night lights. Install grab bars by the toilet and in the tub and shower. Do not use towel bars as grab bars. Use non-skid mats or decals in the tub or shower. If you need to sit down in the shower, use a plastic, non-slip stool. Keep the floor dry. Clean up any water that spills on the floor as soon as it happens. Remove soap buildup in the tub or shower regularly. Attach bath mats securely with double-sided non-slip rug tape. Do not have throw rugs and other things on the floor that can make you trip. What can I do in the bedroom? Use night lights. Make sure that you have a light by your bed that is easy to reach. Do not use any sheets or blankets that are too big for your bed. They should not hang down onto the floor. Have a firm chair that has side arms. You can use this for support while you get dressed. Do not have throw rugs and other things on the floor that can make you trip. What can I do in the kitchen? Clean up any spills right away. Avoid walking on wet floors. Keep items that you use a lot in easy-to-reach places. If you need to reach something above you, use a strong step stool that has a grab bar. Keep electrical cords out of the way. Do not use floor polish or wax that makes floors slippery. If you must use wax, use non-skid floor wax. Do not have throw rugs and other things on the floor that can make you  trip. What can I do with my stairs? Do not leave any items on the stairs. Make sure that there are handrails on both sides of the stairs and use them. Fix handrails that are broken or loose. Make sure that handrails are as long as the stairways. Check any carpeting to make sure that it is firmly attached to the stairs. Fix any carpet that is loose or worn. Avoid having throw rugs at the top or bottom of the stairs. If you do have throw rugs, attach them to the floor with carpet tape. Make sure that you have a light switch at the top of the stairs and the bottom of the  stairs. If you do not have them, ask someone to add them for you. What else can I do to help prevent falls? Wear shoes that: Do not have high heels. Have rubber bottoms. Are comfortable and fit you well. Are closed at the toe. Do not wear sandals. If you use a stepladder: Make sure that it is fully opened. Do not climb a closed stepladder. Make sure that both sides of the stepladder are locked into place. Ask someone to hold it for you, if possible. Clearly mark and make sure that you can see: Any grab bars or handrails. First and last steps. Where the edge of each step is. Use tools that help you move around (mobility aids) if they are needed. These include: Canes. Walkers. Scooters. Crutches. Turn on the lights when you go into a dark area. Replace any light bulbs as soon as they burn out. Set up your furniture so you have a clear path. Avoid moving your furniture around. If any of your floors are uneven, fix them. If there are any pets around you, be aware of where they are. Review your medicines with your doctor. Some medicines can make you feel dizzy. This can increase your chance of falling. Ask your doctor what other things that you can do to help prevent falls. This information is not intended to replace advice given to you by your health care provider. Make sure you discuss any questions you have with your health care provider. Document Released: 08/29/2009 Document Revised: 04/09/2016 Document Reviewed: 12/07/2014 Elsevier Interactive Patient Education  2017 [employer].

## 2021-10-07 NOTE — Telephone Encounter (Signed)
Patient requesting a call back

## 2021-10-08 NOTE — Telephone Encounter (Signed)
Spoke with patient; Mailed DNR and MOST forms.

## 2021-10-20 ENCOUNTER — Encounter: Payer: Self-pay | Admitting: Internal Medicine

## 2021-10-21 NOTE — Progress Notes (Signed)
HPI male former smoker followed for OSA, insomnia, complicated by ETOH, COPD, GERD, HBP NPSG 10/31/13- AHI 52/hour, desaturation to 83%, body weight 165 pounds Unattended Home Sleep Test 09/02/2016- AHI 9.4/hour, desaturation to 87%, body weight 163 pounds Office Spirometry 01/2019-moderate obstructive airways disease.  FVC 3.6/92%, FEV1 2.0/67%, ratio 1.54, FEF 25-75% 1.9/45% PFT 10/16/20- moderately severe obstruction, some resp to BD, mod severe DLCO deficit, emphysema pattern FEV1/FVC 0.52 Walk test room air 01/22/21- lowest sat 91%, max HR 10 -------------------------------------------------------------------------------------   01/22/21- 75 year old male former smoker followed for OSA, insomnia, complicated by history EtOH, COPD, GERD, HBP CPAP auto 5-20/[facility]/ [facility] Albuterol hfa, Trelegy 100, Atrovent nasal,   Trazodone 50 Cardiac cath and Echo recently- mild non-obstructive CAD, EF 57% PFT 10/16/20- moderately severe obstruction, some resp to BD, mod severe DLCO deficit, emphysema pattern FEV1/FVC 0.52 Download-compliance 73%, AHI 6.5 Body weight today- Covid vax-3P hizer Flu vax-had -----Patient states he does not sleep that well, machine is working good, shortness with exertion He notes some gradual increased DOE over time, mainly with extra effort like pulling trashcans. Some chest tightness but checked ok by cardiology. Little cough or wheeze. Trelegy not dramatic benefit.   CPAP humidity rains out in hose. He will talk with DME about how to adjust. Also thinks machine > 5 yo and due for replacement. Discussed supply chain delays. Walk test room air 01/22/21- lowest sat 91%, max HR 102.  10/22/21- 75 year old male former smoker followed for OSA, insomnia, complicated by history EtOH, COPD, GERD, HBP CPAP auto 5-20/[facility]/ [facility] -Albuterol hfa, Trelegy 200, Atrovent nasal,   Trazodone 50 Download-compliance 80%, AHI 7/ hr. Body weight today-170 lbs Covid vax-4 Phizer Flu  vax-had Did Pulmonary Rehab- -----Wearing CPAP-Doing good No problem with CPAP had no concerns with that.  Download reviewed. He again mentions dyspnea on exertion which is stable.  He like to pulmonary rehabilitation but says he has gone to [employer] from context I think means once or twice.  I pressed him to make this a more regular habit.  He has little cough or wheeze and says he can copy our walk Test here at the office "all day long".  Its extra exertion such as lifting or pulling trash cans that he finds more difficult.  No chest pain or palpitation. I considered trying theophylline but he says GERD symptoms are common. CXR 01/22/21 IMPRESSION: 1. No active cardiopulmonary disease. 2. COPD.  ROS-see HPI   + = positive Constitutional:    weight loss, night sweats, fevers, chills, +fatigue, lassitude. HEENT:    headaches, difficulty swallowing, tooth/dental problems, sore throat,       sneezing, itching, ear ache, nasal congestion, post nasal drip, snoring CV:    +chest pain, orthopnea, PND, swelling in lower extremities, anasarca,                                  izziness, palpitations Resp:   +shortness of breath with exertion or at rest.                productive cough,   non-productive cough, coughing up of blood.              change in color of mucus.  wheezing.   Skin:    rash or lesions. GI:  + heartburn, indigestion, abdominal pain, nausea, vomiting, diarrhea,  change in bowel habits, loss of appetite GU: dysuria, change in color of urine, no urgency or frequency.   flank pain. MS:   joint pain, stiffness, decreased range of motion, back pain. Neuro-     nothing unusual Psych:  change in mood or affect.  depression or anxiety.   memory loss.  OBJ- Physical Exam                   General- Alert, Oriented, Affect-appropriate, Distress- none acute, + not obese Skin- rash-none, lesions- none, excoriation- none Lymphadenopathy-  none Head- atraumatic            Eyes- Gross vision intact, PERRLA, conjunctivae and secretions clear            Ears- Hearing, canals-normal            Nose- Clear, no-Septal dev, mucus, polyps, erosion, perforation             Throat- Mallampati III-IV , mucosa clear , drainage- none, tonsils- atrophic Neck- flexible , trachea midline, no stridor , thyroid nl, carotid no bruit Chest - symmetrical excursion , unlabored           Heart/CV- RRR , no murmur , no gallop  , no rub, nl s1 s2                           - JVD- none , edema- none, stasis changes- none, varices- none           Lung- clear to P&A, wheeze- none, cough- none , dullness-none, rub- none           Chest wall-  Abd-  Br/ Gen/ Rectal- Not done, not indicated Extrem- cyanosis- none, clubbing, none, atrophy- none, strength- nl Neuro- grossly intact to observation

## 2021-10-22 ENCOUNTER — Other Ambulatory Visit: Payer: Self-pay

## 2021-10-22 ENCOUNTER — Encounter: Payer: Self-pay | Admitting: Internal Medicine

## 2021-10-22 ENCOUNTER — Ambulatory Visit (INDEPENDENT_AMBULATORY_CARE_PROVIDER_SITE_OTHER): Payer: PPO | Admitting: Internal Medicine

## 2021-10-22 VITALS — BP 120/58 | HR 78 | Temp 98.0°F | Ht 70.0 in | Wt 170.8 lb

## 2021-10-22 DIAGNOSIS — G4733 Obstructive sleep apnea (adult) (pediatric): Secondary | ICD-10-CM

## 2021-10-22 DIAGNOSIS — J432 Centrilobular emphysema: Secondary | ICD-10-CM

## 2021-10-22 DIAGNOSIS — J449 Chronic obstructive pulmonary disease, unspecified: Secondary | ICD-10-CM | POA: Diagnosis not present

## 2021-10-22 NOTE — Assessment & Plan Note (Signed)
Benefits from CPAP and will continue auto 5-20 with good compliance and control.

## 2021-10-22 NOTE — Patient Instructions (Signed)
Order- overnight oximetry on CPAP dx COPD mixed type  Order- DME [facility]- please replace old CPAP machine, auto 5-20, mask of choice, humidifier, supplies, AirView/ card  Try to take advantage of Silver [employer]akers at the [employer]" to go and stay active to keep up your strength and endurance

## 2021-10-22 NOTE — Assessment & Plan Note (Signed)
Mainly emphysema without much room for additional response to bronchodilators. Plan-schedule overnight oximetry.  Stay active, especially walking for endurance.  Try to get regular with Silver Sneakers.

## 2021-11-07 ENCOUNTER — Other Ambulatory Visit: Payer: Self-pay

## 2021-11-07 MED ORDER — TERAZOSIN HCL 2 MG PO CAPS: 2.0000 mg | ORAL_CAPSULE | Freq: Every day | ORAL | 3 refills | Status: AC

## 2021-11-20 ENCOUNTER — Encounter: Payer: Self-pay | Admitting: Internal Medicine

## 2021-11-20 DIAGNOSIS — R0683 Snoring: Secondary | ICD-10-CM | POA: Diagnosis not present

## 2021-11-20 DIAGNOSIS — G4730 Sleep apnea, unspecified: Secondary | ICD-10-CM | POA: Diagnosis not present

## 2021-11-24 ENCOUNTER — Telehealth: Payer: Self-pay | Admitting: Internal Medicine

## 2021-11-25 NOTE — Telephone Encounter (Signed)
Per the ONO order, pt is to wear CPAP while doing the test. Called and spoke with pt letting him know this info and he verbalized understanding. Nothing further needed.

## 2021-11-26 NOTE — Telephone Encounter (Signed)
LMTCB  We have to get results from ONO before making any recommendations.

## 2021-11-27 NOTE — Telephone Encounter (Signed)
On over-night oximetry test his oxygen didn't get lower that 90%. I don't think oxygen during sleep will help his shortness of breath with exertion. Suggest he get into a regular habit of going to [employer] and staying active to keep endurance up.

## 2021-11-27 NOTE — Telephone Encounter (Signed)
Have you received patient's ONO results? ?

## 2021-11-27 NOTE — Telephone Encounter (Signed)
ATC patient to go over ONO results, LMTCB ?

## 2021-11-27 NOTE — Telephone Encounter (Signed)
We already ordered his new CPAP in Dec 2022. Per referral notes- he was not eligible for new machine until 11/18/21. He is aware [facility] will process the order for new CPAP. He is aware to contact them with questions about order status.    Pt asking if we have received his ONO results yet. He says the test was done 11/24/21. Mandi, have you received this yet?

## 2021-11-28 NOTE — Telephone Encounter (Signed)
Spoke with pt and notified of results per Dr. Young Pt verbalized understanding and denied any questions. 

## 2021-12-23 ENCOUNTER — Telehealth: Payer: Self-pay

## 2021-12-23 MED ORDER — CETIRIZINE HCL 10 MG PO TABS: 10.0000 mg | ORAL_TABLET | Freq: Every day | ORAL | 3 refills | Status: DC

## 2021-12-23 NOTE — Telephone Encounter (Signed)
Pt is calling requesting a refill on: cetirizine (ZYRTEC) 10 MG tablet   It's discontinue medication list  Pharmacy: [id number] - [place] ([place]), [place] - [address]  LOV: 10/02/21

## 2022-01-06 DIAGNOSIS — G4700 Insomnia, unspecified: Secondary | ICD-10-CM | POA: Diagnosis not present

## 2022-01-06 DIAGNOSIS — R0683 Snoring: Secondary | ICD-10-CM | POA: Diagnosis not present

## 2022-01-06 DIAGNOSIS — G4733 Obstructive sleep apnea (adult) (pediatric): Secondary | ICD-10-CM | POA: Diagnosis not present

## 2022-01-19 ENCOUNTER — Encounter: Payer: Self-pay | Admitting: Internal Medicine

## 2022-01-28 ENCOUNTER — Encounter: Payer: Self-pay | Admitting: Internal Medicine

## 2022-01-28 ENCOUNTER — Other Ambulatory Visit: Payer: Self-pay

## 2022-01-28 ENCOUNTER — Ambulatory Visit (INDEPENDENT_AMBULATORY_CARE_PROVIDER_SITE_OTHER): Payer: PPO | Admitting: Internal Medicine

## 2022-01-28 VITALS — BP 132/80 | HR 80 | Temp 98.6°F | Ht 70.0 in | Wt 167.0 lb

## 2022-01-28 DIAGNOSIS — I10 Essential (primary) hypertension: Secondary | ICD-10-CM

## 2022-01-28 DIAGNOSIS — R7302 Impaired glucose tolerance (oral): Secondary | ICD-10-CM | POA: Diagnosis not present

## 2022-01-28 DIAGNOSIS — E7800 Pure hypercholesterolemia, unspecified: Secondary | ICD-10-CM

## 2022-01-28 DIAGNOSIS — E538 Deficiency of other specified B group vitamins: Secondary | ICD-10-CM | POA: Diagnosis not present

## 2022-01-28 DIAGNOSIS — E559 Vitamin D deficiency, unspecified: Secondary | ICD-10-CM | POA: Diagnosis not present

## 2022-01-28 DIAGNOSIS — Z0000 Encounter for general adult medical examination without abnormal findings: Secondary | ICD-10-CM | POA: Diagnosis not present

## 2022-01-28 LAB — BASIC METABOLIC PANEL
BUN: 22 mg/dL (ref 6–23)
CO2: 30 mEq/L (ref 19–32)
Calcium: 9.9 mg/dL (ref 8.4–10.5)
Chloride: 100 mEq/L (ref 96–112)
Creatinine, Ser: 1.38 mg/dL (ref 0.40–1.50)
GFR: 49.87 mL/min — ABNORMAL LOW (ref >60.00)
Glucose, Bld: 94 mg/dL (ref 70–99)
Potassium: 4.4 mEq/L (ref 3.5–5.1)
Sodium: 137 mEq/L (ref 135–145)

## 2022-01-28 LAB — CBC WITH DIFFERENTIAL/PLATELET
Basophils Absolute: 0 10*3/uL (ref 0.0–0.1)
Basophils Relative: 0.5 % (ref 0.0–3.0)
Eosinophils Absolute: 0.2 10*3/uL (ref 0.0–0.7)
Eosinophils Relative: 1.8 % (ref 0.0–5.0)
HCT: 48 % (ref 39.0–52.0)
Hemoglobin: 16.4 g/dL (ref 13.0–17.0)
Lymphocytes Relative: 23.4 % (ref 12.0–46.0)
Lymphs Abs: 2 10*3/uL (ref 0.7–4.0)
MCHC: 34.2 g/dL (ref 30.0–36.0)
MCV: 96.8 fl (ref 78.0–100.0)
Monocytes Absolute: 1 10*3/uL (ref 0.1–1.0)
Monocytes Relative: 12.1 % — ABNORMAL HIGH (ref 3.0–12.0)
Neutro Abs: 5.3 10*3/uL (ref 1.4–7.7)
Neutrophils Relative %: 62.2 % (ref 43.0–77.0)
Platelets: 297 10*3/uL (ref 150.0–400.0)
RBC: 4.97 Mil/uL (ref 4.22–5.81)
RDW: 13.2 % (ref 11.5–15.5)
WBC: 8.5 10*3/uL (ref 4.0–10.5)

## 2022-01-28 LAB — VITAMIN D 25 HYDROXY (VIT D DEFICIENCY, FRACTURES): VITD: 39.44 ng/mL (ref 30.00–100.00)

## 2022-01-28 LAB — URINALYSIS, ROUTINE W REFLEX MICROSCOPIC
Bilirubin Urine: NEGATIVE
Hgb urine dipstick: NEGATIVE
Ketones, ur: NEGATIVE
Leukocytes,Ua: NEGATIVE
Nitrite: NEGATIVE
RBC / HPF: NONE SEEN (ref 0–?)
Specific Gravity, Urine: 1.01 (ref 1.000–1.030)
Urine Glucose: NEGATIVE
Urobilinogen, UA: 0.2 (ref 0.0–1.0)
WBC, UA: NONE SEEN (ref 0–?)
pH: 8 (ref 5.0–8.0)

## 2022-01-28 LAB — LIPID PANEL
Cholesterol: 161 mg/dL (ref 0–200)
HDL: 45.8 mg/dL (ref >39.00)
LDL Cholesterol: 81 mg/dL (ref 0–99)
NonHDL: 115.37
Total CHOL/HDL Ratio: 4
Triglycerides: 174 mg/dL — ABNORMAL HIGH (ref 0.0–149.0)
VLDL: 34.8 mg/dL (ref 0.0–40.0)

## 2022-01-28 LAB — HEPATIC FUNCTION PANEL
ALT: 26 U/L (ref 0–53)
AST: 21 U/L (ref 0–37)
Albumin: 4.6 g/dL (ref 3.5–5.2)
Alkaline Phosphatase: 73 U/L (ref 39–117)
Bilirubin, Direct: 0.1 mg/dL (ref 0.0–0.3)
Total Bilirubin: 0.7 mg/dL (ref 0.2–1.2)
Total Protein: 7.3 g/dL (ref 6.0–8.3)

## 2022-01-28 LAB — HEMOGLOBIN A1C: Hgb A1c MFr Bld: 5.7 % (ref 4.6–6.5)

## 2022-01-28 LAB — PSA: PSA: 1.57 ng/mL (ref 0.10–4.00)

## 2022-01-28 LAB — VITAMIN B12: Vitamin B-12: 376 pg/mL (ref 211–911)

## 2022-01-28 LAB — TSH: TSH: 0.83 u[IU]/mL (ref 0.35–5.50)

## 2022-01-28 NOTE — Assessment & Plan Note (Signed)
Last vitamin D ?Lab Results  ?Component Value Date  ? VD25OH 32.85 05/13/2021  ? ?Low, currently on oral replacement - for f/u lab today ? ?

## 2022-01-28 NOTE — Progress Notes (Signed)
Patient ID: Ryan Diaz, male   DOB: 1946-11-10, 76 y.o.   MRN: [id number] ? ? ? ?     Chief Complaint:: wellness exam and Follow-up ? Low vit d, hld ? ?     HPI:  Ryan Diaz is a 76 y.o. male here for wellness exam; declines colonoscopy, covid booster o/w up to date ?         ?              Also Pt denies chest pain, increased sob or doe, wheezing, orthopnea, PND, increased LE swelling, palpitations, dizziness or syncope.   Pt denies polydipsia, polyuria, or new focal neuro s/s.   Pt denies fever, wt loss, night sweats, loss of appetite, or other constitutional symptoms  No other new complaints.  Trying to follow lower chol diet ?  ?Wt Readings from Last 3 Encounters:  ?01/28/22 167 lb (75.8 kg)  ?10/22/21 170 lb 12.8 oz (77.5 kg)  ?10/07/21 168 lb (76.2 kg)  ? ?BP Readings from Last 3 Encounters:  ?01/28/22 132/80  ?10/22/21 (!) 120/58  ?10/07/21 132/80  ? ?Immunization History  ?Administered Date(s) Administered  ? Fluad Quad(high Dose 65+) 08/04/2019, 07/26/2020, 07/31/2021  ? H1N1 09/16/2008  ? Influenza Split 08/27/2013, 07/28/2017, 09/16/2018  ? Influenza Whole 08/17/2005, 09/16/2009  ? Influenza, High Dose Seasonal PF 08/18/2017  ? Influenza, Seasonal, Injecte, Preservative Fre 08/12/2010, 09/07/2011, 09/15/2012, 08/29/2013  ? Influenza,inj,Quad PF,6+ Mos 07/27/2016  ? Influenza,trivalent, recombinat, inj, PF 08/27/2018  ? Influenza-Unspecified 07/17/2006, 07/18/2007, 08/16/2008, 09/18/2009, 10/04/2014, 10/17/2015, 09/16/2016, 08/23/2017, 08/16/2018, 09/16/2018, 08/04/2019, 07/26/2020  ? PFIZER Comirnaty(Gray Top)Covid-19 Tri-Sucrose Vaccine 03/13/2021  ? PFIZER(Purple Top)SARS-COV-2 Vaccination 12/09/2019, 12/30/2019, 08/13/2020  ? Pneumococcal Conjugate-13 04/10/2014, 06/27/2014  ? Pneumococcal Polysaccharide-23 08/19/2006, 05/11/2007, 09/15/2012, 03/09/2018  ? Td 01/01/2017  ? Tdap 05/11/2007  ? Zoster Recombinat (Shingrix) 01/04/2018, 04/04/2018  ? Zoster, Live 08/19/2006, 01/01/2012  ? ?There are no  preventive care reminders to display for this patient. ? ?  ? ?Past Medical History:  ?Diagnosis Date  ? ABUSE, ALCOHOL, CONTINUOUS 08/08/2007  ? ALLERGIC RHINITIS 08/08/2007  ? ANXIETY 08/08/2007  ? Carpal tunnel syndrome   ? COPD 10/03/2009  ? DEPRESSION 08/08/2007  ? ERECTILE DYSFUNCTION 08/08/2007  ? GENITAL HERPES, HX OF 08/08/2007  ? GERD 08/08/2007  ? GLUCOSE INTOLERANCE 08/08/2007  ? HYPERLIPIDEMIA 08/08/2007  ? HYPERTENSION 08/08/2007  ? Impaired glucose tolerance 09/26/2011  ? Insomnia 05/18/2012  ? PAIN IN SOFT TISSUES OF LIMB 10/02/2009  ? Sleep apnea   ? OSA-uses CPAP nightly  ? ?Past Surgical History:  ?Procedure Laterality Date  ? ACHILLES TENDON REPAIR Bilateral   ? CARPAL TUNNEL RELEASE Right 08/18/2018  ? Procedure: RIGHT CARPAL TUNNEL RELEASE;  Surgeon: Betha Loa, MD;  Location: [facility];  Service: Orthopedics;  Laterality: Right;  ? CARPAL TUNNEL RELEASE Left 11/21/2018  ? Procedure: LEFT CARPAL TUNNEL RELEASE;  Surgeon: Betha Loa, MD;  Location: [facility];  Service: Orthopedics;  Laterality: Left;  Bier block  ? COLONOSCOPY    ? LEFT HEART CATH AND CORONARY ANGIOGRAPHY N/A 10/17/2020  ? Procedure: LEFT HEART CATH AND CORONARY ANGIOGRAPHY;  Surgeon: Yvonne Kendall, MD;  Location: [facility];  Service: Cardiovascular;  Laterality: N/A;  ? ? reports that he quit smoking about 12 years ago. His smoking use included cigarettes. He has a 52.50 pack-year smoking history. He has never used smokeless tobacco. He reports current alcohol use of about 46.0 - 53.0 standard drinks per week. He  reports current drug use. Drug: Marijuana. ?family history includes Colon polyps in his brother. ?Allergies  ?Allergen Reactions  ? Sildenafil Palpitations  ? ?Current Outpatient Medications on File Prior to Visit  ?Medication Sig Dispense Refill  ? albuterol (ACCUNEB) 0.63 MG/3ML nebulizer solution Take 3 mLs (0.63 mg total) by nebulization every 6 (six) hours as needed for wheezing. 75  mL 12  ? albuterol (PROVENTIL HFA;VENTOLIN HFA) 108 (90 BASE) MCG/ACT inhaler Inhale 2 puffs into the lungs every 6 (six) hours as needed for wheezing or shortness of breath.    ? aspirin EC 81 MG tablet Take 81 mg by mouth daily.    ? atorvastatin (LIPITOR) 80 MG tablet Take 1 tablet (80 mg total) by mouth daily. 90 tablet 3  ? cetirizine (ZYRTEC) 10 MG tablet Take 1 tablet (10 mg total) by mouth daily. 90 tablet 3  ? chlorhexidine (PERIDEX) 0.12 % solution     ? Cholecalciferol (VITAMIN D) 50 MCG (2000 UT) tablet Take 1 tablet (2,000 Units total) by mouth daily. 90 tablet 3  ? clotrimazole (LOTRIMIN) 1 % cream Apply 1 application topically 2 (two) times daily as needed (irritation).    ? docusate sodium (COLACE) 100 MG capsule Take 100 mg by mouth daily as needed for mild constipation.    ? donepezil (ARICEPT) 5 MG tablet Take 1 tablet (5 mg total) by mouth at bedtime. 90 tablet 5  ? Ferrous Sulfate 27 MG TABS Take 27 mg by mouth daily as needed (pt prefrence).    ? fluocinonide ointment (LIDEX) 0.05 % Apply 1 application topically 2 (two) times daily. 30 g 2  ? fluticasone (CUTIVATE) 0.05 % cream Apply 1 application topically daily as needed (irritation).    ? Fluticasone-Umeclidin-Vilant (TRELEGY ELLIPTA) 200-62.5-25 MCG/INH AEPB Inhale 1 puff into the lungs daily. 60 each 5  ? gabapentin (NEURONTIN) 100 MG capsule Take 1 capsule (100 mg total) by mouth 3 (three) times daily. (Patient taking differently: Take 100 mg by mouth 3 (three) times daily as needed (pain).) 90 capsule 5  ? ipratropium (ATROVENT) 0.06 % nasal spray Place 2 sprays into both nostrils 3 (three) times daily. 15 mL 5  ? losartan (COZAAR) 100 MG tablet Take 1 tablet (100 mg total) by mouth daily. 90 tablet 3  ? meloxicam (MOBIC) 15 MG tablet 1 tab by mouth once daily as needed (Patient taking differently: Take 15 mg by mouth daily as needed for pain. 1 tab by mouth once daily as needed) 90 tablet 1  ? nitroGLYCERIN (NITROSTAT) 0.4 MG SL tablet  Place 1 tablet (0.4 mg total) under the tongue every 5 (five) minutes as needed for chest pain. 25 tablet 4  ? Omega-3 Fatty Acids (FISH OIL) 1000 MG CAPS Take 1,000 mg by mouth 2 (two) times daily.    ? omeprazole (PRILOSEC) 20 MG capsule Take 1 capsule (20 mg total) by mouth 2 (two) times daily. (Patient taking differently: Take 20 mg by mouth 2 (two) times daily as needed (acid reflux).) 90 capsule 3  ? tadalafil (CIALIS) 20 MG tablet Take 20 mg by mouth daily as needed for erectile dysfunction.    ? terazosin (HYTRIN) 2 MG capsule Take 1 capsule (2 mg total) by mouth at bedtime. 90 capsule 3  ? traZODone (DESYREL) 50 MG tablet Take 50 mg by mouth at bedtime as needed for sleep.    ? ?No current facility-administered medications on file prior to visit.  ? ?     ROS:  All  others reviewed and negative. ? ?Objective  ? ?     PE:  BP 132/80 (BP Location: Right Arm, Patient Position: Sitting, Cuff Size: Normal)   Pulse 80   Temp 98.6 ?F (37 ?C) (Oral)   Ht 5\' 10"  (1.778 m)   Wt 167 lb (75.8 kg)   SpO2 96%   BMI 23.96 kg/m?  ? ?              Constitutional: Pt appears in NAD ?              HENT: Head: NCAT.  ?              Right Ear: External ear normal.   ?              Left Ear: External ear normal.  ?              Eyes: . Pupils are equal, round, and reactive to light. Conjunctivae and EOM are normal ?              Nose: without d/c or deformity ?              Neck: Neck supple. Gross normal ROM ?              Cardiovascular: Normal rate and regular rhythm.   ?              Pulmonary/Chest: Effort normal and breath sounds without rales or wheezing.  ?              Abd:  Soft, NT, ND, + BS, no organomegaly ?              Neurological: Pt is alert. At baseline orientation, motor grossly intact ?              Skin: Skin is warm. No rashes, no other new lesions, LE edema - none ?              Psychiatric: Pt behavior is normal without agitation  ? ?Micro: none ? ?Cardiac tracings I have personally interpreted  today:  none ? ?Pertinent Radiological findings (summarize): none  ? ?Lab Results  ?Component Value Date  ? WBC 8.5 01/28/2022  ? HGB 16.4 01/28/2022  ? HCT 48.0 01/28/2022  ? PLT 297.0 01/28/2022  ? GLUCOSE

## 2022-01-28 NOTE — Patient Instructions (Signed)

## 2022-01-28 NOTE — Progress Notes (Signed)
Patient ID: Ryan Diaz, male   DOB: 1946-06-02, 76 y.o.   MRN: [id number] ? ?

## 2022-01-28 NOTE — Assessment & Plan Note (Signed)
Lab Results  ?Component Value Date  ? LDLCALC 79 03/27/2020  ? ?Uncontrolled, goal ldl < 70 given hx of cad,now on high dose lipitor 80 since last visit -  For f/u lab today ? ?

## 2022-01-31 ENCOUNTER — Encounter: Payer: Self-pay | Admitting: Internal Medicine

## 2022-01-31 NOTE — Assessment & Plan Note (Signed)
BP Readings from Last 3 Encounters:  ?01/28/22 132/80  ?10/22/21 (!) 120/58  ?10/07/21 132/80  ? ?Stable, pt to continue medical treatment losartan ? ? ?

## 2022-01-31 NOTE — Assessment & Plan Note (Signed)
Lab Results  ?Component Value Date  ? HGBA1C 5.7 01/28/2022  ? ?Stable, pt to continue current medical treatment  = diet ? ?

## 2022-01-31 NOTE — Assessment & Plan Note (Signed)
Age and sex appropriate education and counseling updated with regular exercise and diet ?Referrals for preventative services - decliens colonoscopy ?Immunizations addressed - declines covid booster ?Smoking counseling  - none needed ?Evidence for depression or other mood disorder - none significant ?Most recent labs reviewed. ?I have personally reviewed and have noted: ?1) the patient's medical and social history ?2) The patient's current medications and supplements ?3) The patient's height, weight, and BMI have been recorded in the chart ? ?

## 2022-02-14 DIAGNOSIS — G4700 Insomnia, unspecified: Secondary | ICD-10-CM | POA: Diagnosis not present

## 2022-02-14 DIAGNOSIS — G4733 Obstructive sleep apnea (adult) (pediatric): Secondary | ICD-10-CM | POA: Diagnosis not present

## 2022-02-14 DIAGNOSIS — R0683 Snoring: Secondary | ICD-10-CM | POA: Diagnosis not present

## 2022-02-20 DIAGNOSIS — H02834 Dermatochalasis of left upper eyelid: Secondary | ICD-10-CM | POA: Diagnosis not present

## 2022-02-20 DIAGNOSIS — H02831 Dermatochalasis of right upper eyelid: Secondary | ICD-10-CM | POA: Diagnosis not present

## 2022-02-20 DIAGNOSIS — I10 Essential (primary) hypertension: Secondary | ICD-10-CM | POA: Diagnosis not present

## 2022-02-20 DIAGNOSIS — H02403 Unspecified ptosis of bilateral eyelids: Secondary | ICD-10-CM | POA: Diagnosis not present

## 2022-02-20 DIAGNOSIS — E785 Hyperlipidemia, unspecified: Secondary | ICD-10-CM | POA: Diagnosis not present

## 2022-02-20 DIAGNOSIS — Z87891 Personal history of nicotine dependence: Secondary | ICD-10-CM | POA: Diagnosis not present

## 2022-02-20 DIAGNOSIS — Z7982 Long term (current) use of aspirin: Secondary | ICD-10-CM | POA: Diagnosis not present

## 2022-02-20 DIAGNOSIS — H57813 Brow ptosis, bilateral: Secondary | ICD-10-CM | POA: Diagnosis not present

## 2022-02-25 ENCOUNTER — Ambulatory Visit (INDEPENDENT_AMBULATORY_CARE_PROVIDER_SITE_OTHER): Payer: PPO | Admitting: Internal Medicine

## 2022-02-25 ENCOUNTER — Encounter: Payer: Self-pay | Admitting: Internal Medicine

## 2022-02-25 VITALS — BP 150/82 | HR 101 | Temp 98.5°F | Ht 70.0 in | Wt 168.8 lb

## 2022-02-25 DIAGNOSIS — I10 Essential (primary) hypertension: Secondary | ICD-10-CM | POA: Diagnosis not present

## 2022-02-25 DIAGNOSIS — R519 Headache, unspecified: Secondary | ICD-10-CM | POA: Diagnosis not present

## 2022-02-25 MED ORDER — KETOROLAC TROMETHAMINE 30 MG/ML IJ SOLN
30.0000 mg | Freq: Once | INTRAMUSCULAR | Status: AC
  Administered 2022-02-25: 30 mg via INTRAMUSCULAR

## 2022-02-25 MED ORDER — LOSARTAN POTASSIUM 100 MG PO TABS: 100.0000 mg | ORAL_TABLET | Freq: Every day | ORAL | 3 refills | Status: DC

## 2022-02-25 NOTE — Assessment & Plan Note (Signed)
Chronic ?BP very elevated here today - not taking losartan - said it was stopped last summer when BP was on low side ?Restart losartan 100 mg daily ?Stressed good BP control ?Monitor BP at home - discussed goal ?F/u with PCP in 3-4 weeks ?

## 2022-02-25 NOTE — Patient Instructions (Addendum)
? ? ?  Toradol 30 mg IM x 1 ? ? ?Medications changes include :   restart losartan 100 mg daily ? ? ?Your prescription(s) have been sent to your pharmacy.  ? ? ? ?Return in about 4 weeks (around 03/25/2022) for PCP for hypertension. ? ?

## 2022-02-25 NOTE — Progress Notes (Signed)
? ? ?Subjective:  ? ? Patient ID: Ryan Diaz, male    DOB: 1946-05-20, 76 y.o.   MRN: [id number] ? ?This visit occurred during the SARS-CoV-2 public health emergency.  Safety protocols were in place, including screening questions prior to the visit, additional usage of staff PPE, and extensive cleaning of exam room while observing appropriate contact time as indicated for disinfecting solutions. ? ? ? ?HPI ?Ryan Diaz is here for  ?Chief Complaint  ?Patient presents with  ? Hypertension  ?  Eye surgery last Friday; BP running high last few days; Patient reports headaches but no chest pain/arm discomfort/palpatations  ? ? ? ?BP at home 175/81 or higher.  Had eye surgery last Friday - vision is blurry.  Having headaches with elevated BP.  Also states some lightheadedness/dizziness.  ? ? ?BP was low last summer and his BP medication was stopped - he is not taking anything for BP right now.    ? ?No chest pain, palps or change in chronic SOB.  ? ? ?Medications and allergies reviewed with patient and updated if appropriate. ? ?Current Outpatient Medications on File Prior to Visit  ?Medication Sig Dispense Refill  ? albuterol (ACCUNEB) 0.63 MG/3ML nebulizer solution Take 3 mLs (0.63 mg total) by nebulization every 6 (six) hours as needed for wheezing. 75 mL 12  ? albuterol (PROVENTIL HFA;VENTOLIN HFA) 108 (90 BASE) MCG/ACT inhaler Inhale 2 puffs into the lungs every 6 (six) hours as needed for wheezing or shortness of breath.    ? aspirin EC 81 MG tablet Take 81 mg by mouth daily.    ? atorvastatin (LIPITOR) 80 MG tablet Take 1 tablet (80 mg total) by mouth daily. 90 tablet 3  ? cetirizine (ZYRTEC) 10 MG tablet Take 1 tablet (10 mg total) by mouth daily. 90 tablet 3  ? chlorhexidine (PERIDEX) 0.12 % solution     ? Cholecalciferol (VITAMIN D) 50 MCG (2000 UT) tablet Take 1 tablet (2,000 Units total) by mouth daily. 90 tablet 3  ? clotrimazole (LOTRIMIN) 1 % cream Apply 1 application topically 2 (two) times daily as needed  (irritation).    ? docusate sodium (COLACE) 100 MG capsule Take 100 mg by mouth daily as needed for mild constipation.    ? donepezil (ARICEPT) 5 MG tablet Take 1 tablet (5 mg total) by mouth at bedtime. 90 tablet 5  ? ergocalciferol (VITAMIN D2) 1.25 MG (50000 UT) capsule Take by mouth.    ? erythromycin ophthalmic ointment Administer to both eyes Three (3) times a day.    ? Ferrous Sulfate 27 MG TABS Take 27 mg by mouth daily as needed (pt prefrence).    ? fluocinonide ointment (LIDEX) 0.05 % Apply 1 application topically 2 (two) times daily. 30 g 2  ? fluticasone (CUTIVATE) 0.05 % cream Apply 1 application topically daily as needed (irritation).    ? Fluticasone-Umeclidin-Vilant (TRELEGY ELLIPTA) 200-62.5-25 MCG/INH AEPB Inhale 1 puff into the lungs daily. 60 each 5  ? gabapentin (NEURONTIN) 100 MG capsule Take 1 capsule (100 mg total) by mouth 3 (three) times daily. (Patient taking differently: Take 100 mg by mouth 3 (three) times daily as needed (pain).) 90 capsule 5  ? ipratropium (ATROVENT) 0.06 % nasal spray Place 2 sprays into both nostrils 3 (three) times daily. 15 mL 5  ? losartan (COZAAR) 100 MG tablet Take 1 tablet (100 mg total) by mouth daily. 90 tablet 3  ? meloxicam (MOBIC) 15 MG tablet 1 tab by mouth once daily as  needed (Patient taking differently: Take 15 mg by mouth daily as needed for pain. 1 tab by mouth once daily as needed) 90 tablet 1  ? neomycin-polymyxin-dexameth (MAXITROL) 0.1 % OINT Administer 1 application to both eyes two (2) times a day.    ? nitroGLYCERIN (NITROSTAT) 0.4 MG SL tablet Place 1 tablet (0.4 mg total) under the tongue every 5 (five) minutes as needed for chest pain. 25 tablet 4  ? Omega-3 Fatty Acids (FISH OIL) 1000 MG CAPS Take 1,000 mg by mouth 2 (two) times daily.    ? omeprazole (PRILOSEC) 20 MG capsule Take 1 capsule (20 mg total) by mouth 2 (two) times daily. (Patient taking differently: Take 20 mg by mouth 2 (two) times daily as needed (acid reflux).) 90 capsule 3   ? tadalafil (CIALIS) 20 MG tablet Take 20 mg by mouth daily as needed for erectile dysfunction.    ? terazosin (HYTRIN) 2 MG capsule Take 1 capsule (2 mg total) by mouth at bedtime. 90 capsule 3  ? traZODone (DESYREL) 50 MG tablet Take 50 mg by mouth at bedtime as needed for sleep.    ? ?No current facility-administered medications on file prior to visit.  ? ? ?Review of Systems  ?Constitutional:  Negative for fever.  ?Eyes:  Positive for visual disturbance (since eye surgery).  ?Respiratory:  Positive for shortness of breath (chronic - no change).   ?Cardiovascular:  Negative for chest pain, palpitations and leg swelling.  ?Neurological:  Positive for dizziness, light-headedness and headaches. Negative for weakness and numbness.  ? ?   ?Objective:  ? ?Vitals:  ? 02/25/22 1000  ?BP: (!) 160/80  ?Pulse: (!) 101  ?Temp: 98.5 ?F (36.9 ?C)  ?SpO2: 98%  ? ?BP Readings from Last 3 Encounters:  ?02/25/22 (!) 160/80  ?01/28/22 132/80  ?10/22/21 (!) 120/58  ? ?Wt Readings from Last 3 Encounters:  ?02/25/22 168 lb 12.8 oz (76.6 kg)  ?01/28/22 167 lb (75.8 kg)  ?10/22/21 170 lb 12.8 oz (77.5 kg)  ? ?Body mass index is 24.22 kg/m?. ? ?  ?Physical Exam ?Constitutional:   ?   General: He is not in acute distress. ?   Appearance: Normal appearance. He is not ill-appearing.  ?HENT:  ?   Head: Normocephalic and atraumatic.  ?Eyes:  ?   Comments: B/l conjunctivae erythema from recent surgery  ?Cardiovascular:  ?   Rate and Rhythm: Normal rate and regular rhythm.  ?   Heart sounds: Normal heart sounds. No murmur heard. ?Pulmonary:  ?   Effort: Pulmonary effort is normal. No respiratory distress.  ?   Breath sounds: Normal breath sounds. No wheezing or rales.  ?Musculoskeletal:  ?   Right lower leg: No edema.  ?   Left lower leg: No edema.  ?Skin: ?   General: Skin is warm and dry.  ?   Findings: No rash.  ?Neurological:  ?   Mental Status: He is alert. Mental status is at baseline.  ?Psychiatric:     ?   Mood and Affect: Mood normal.   ? ?   ? ? ? ? ? ?Assessment & Plan:  ? ? ?See Problem List for Assessment and Plan of chronic medical problems.  ? ? ? ? ?

## 2022-02-25 NOTE — Assessment & Plan Note (Signed)
Acute ?Started with elevated BP ?otc medications have not helped ?Pain is significant ?Restarted BP medication which should improve BP ?Monitor BP  ?Toradol 30 mg IM x1 today ? ?

## 2022-04-05 DIAGNOSIS — R0683 Snoring: Secondary | ICD-10-CM | POA: Diagnosis not present

## 2022-04-05 DIAGNOSIS — G4733 Obstructive sleep apnea (adult) (pediatric): Secondary | ICD-10-CM | POA: Diagnosis not present

## 2022-04-05 DIAGNOSIS — G4700 Insomnia, unspecified: Secondary | ICD-10-CM | POA: Diagnosis not present

## 2022-04-06 DIAGNOSIS — G4700 Insomnia, unspecified: Secondary | ICD-10-CM | POA: Diagnosis not present

## 2022-04-06 DIAGNOSIS — G4733 Obstructive sleep apnea (adult) (pediatric): Secondary | ICD-10-CM | POA: Diagnosis not present

## 2022-04-16 LAB — HM DIABETES EYE EXAM

## 2022-04-21 NOTE — Progress Notes (Signed)
HPI male former smoker followed for OSA, insomnia, complicated by ETOH, COPD, GERD, HBP NPSG 10/31/13- AHI 52/hour, desaturation to 83%, body weight 165 pounds Unattended Home Sleep Test 09/02/2016- AHI 9.4/hour, desaturation to 87%, body weight 163 pounds Office Spirometry 01/2019-moderate obstructive airways disease.  FVC 3.6/92%, FEV1 2.0/67%, ratio 1.54, FEF 25-75% 1.9/45% PFT 10/16/20- moderately severe obstruction, some resp to BD, mod severe DLCO deficit, emphysema pattern FEV1/FVC 0.52 Walk test room air 01/22/21- lowest sat 91%, max HR 10 -------------------------------------------------------------------------------------   10/22/21- 76 year old male former smoker followed for OSA, insomnia, complicated by history EtOH, COPD, GERD, HBP CPAP auto 5-20/[facility]/ [facility] -Albuterol hfa, Trelegy 200, Atrovent nasal,   Trazodone 50 Download-compliance 80%, AHI 7/ hr. Body weight today-170 lbs Covid vax-4 Phizer Flu vax-had Did Pulmonary Rehab- -----Wearing CPAP-Doing good No problem with CPAP had no concerns with that.  Download reviewed. He again mentions dyspnea on exertion which is stable.  He liked pulmonary rehabilitation but says he has gone to [employer] from context I think means once or twice.  I pressed him to make this a more regular habit.  He has little cough or wheeze and says he can copy our walk Test here at the office "all day long".  Its extra exertion, such as lifting or pulling trash cans, that he finds more difficult.  No chest pain or palpitation. I considered trying theophylline but he says GERD symptoms are common. CXR 01/22/21 IMPRESSION: 1. No active cardiopulmonary disease. 2. COPD.  04/23/22- 76 year old male former smoker followed for OSA, insomnia, complicated by history EtOH, COPD, GERD, HTN, CPAP auto 5-20/[facility]/ [facility]  AirSense 11 AutoSet   -Albuterol hfa, Trelegy 200, Atrovent nasal,   Trazodone 50 Download-compliance 77%, AHI  8.8/ hr    Body weight today-168 lbs Covid vax-4 Phizer Flu vax- ------OSA,states no problems Download reviewed.  Feels better with CPAP.  No concerns. Stable dyspnea on exertion.  Sometimes gets a burning sensation after taking out trash cans but no problem at all [facility]  He had cardiac work-up in the last couple of years. Does not find that he needs rescue inhaler at all but does use his Trelegy.  ROS-see HPI   + = positive Constitutional:    weight loss, night sweats, fevers, chills, +fatigue, lassitude. HEENT:    headaches, difficulty swallowing, tooth/dental problems, sore throat,       sneezing, itching, ear ache, nasal congestion, post nasal drip, snoring CV:    +chest pain, orthopnea, PND, swelling in lower extremities, anasarca,                                  izziness, palpitations Resp:   +shortness of breath with exertion or at rest.                productive cough,   non-productive cough, coughing up of blood.              change in color of mucus.  wheezing.   Skin:    rash or lesions. GI:  + heartburn, indigestion, abdominal pain, nausea, vomiting, diarrhea,                 change in bowel habits, loss of appetite GU: dysuria, change in color of urine, no urgency or frequency.   flank pain. MS:   joint pain, stiffness, decreased range of motion, back pain. Neuro-  nothing unusual Psych:  change in mood or affect.  depression or anxiety.   memory loss.  OBJ- Physical Exam                   General- Alert, Oriented, Affect-appropriate, Distress- none acute, + not obese Skin- rash-none, lesions- none, excoriation- none Lymphadenopathy- none Head- atraumatic            Eyes- Gross vision intact, PERRLA, conjunctivae and secretions clear            Ears- Hearing, canals-normal            Nose- Clear, no-Septal dev, mucus, polyps, erosion, perforation             Throat- Mallampati III-IV , mucosa clear , drainage- none, tonsils-  atrophic Neck- flexible , trachea midline, no stridor , thyroid nl, carotid no bruit Chest - symmetrical excursion , unlabored           Heart/CV- RRR , no murmur , no gallop  , no rub, nl s1 s2                           - JVD- none , edema- none, stasis changes- none, varices- none           Lung- clear to P&A, wheeze- none, cough- none , dullness-none, rub- none           Chest wall-  Abd-  Br/ Gen/ Rectal- Not done, not indicated Extrem- cyanosis- none, clubbing, none, atrophy- none, strength- nl Neuro- grossly intact to observation

## 2022-04-23 ENCOUNTER — Ambulatory Visit (INDEPENDENT_AMBULATORY_CARE_PROVIDER_SITE_OTHER): Payer: PPO | Admitting: Internal Medicine

## 2022-04-23 ENCOUNTER — Encounter: Payer: Self-pay | Admitting: Internal Medicine

## 2022-04-23 DIAGNOSIS — J432 Centrilobular emphysema: Secondary | ICD-10-CM

## 2022-04-23 DIAGNOSIS — I2510 Atherosclerotic heart disease of native coronary artery without angina pectoris: Secondary | ICD-10-CM | POA: Diagnosis not present

## 2022-04-23 DIAGNOSIS — G4733 Obstructive sleep apnea (adult) (pediatric): Secondary | ICD-10-CM

## 2022-04-23 NOTE — Assessment & Plan Note (Signed)
Using Trelegy inhaler regularly he does not feel need for rescue inhaler. Plan-continue current meds.

## 2022-04-23 NOTE — Assessment & Plan Note (Signed)
Benefits from CPAP.  Attention to compliance goals recommended. Plan-continue auto 5-20

## 2022-04-23 NOTE — Patient Instructions (Signed)
Glad you feel stable. We can continue CPAP auto 5-20  Please call if we can help

## 2022-04-23 NOTE — Assessment & Plan Note (Signed)
He mentions noticing some burning across his chest occasionally after incidental exertion such as pulling trash cans but [facility] not experience this [facility].  I asked him to keep his primary physician informed.

## 2022-04-27 ENCOUNTER — Other Ambulatory Visit: Payer: Self-pay | Admitting: Primary Care

## 2022-05-06 DIAGNOSIS — R0683 Snoring: Secondary | ICD-10-CM | POA: Diagnosis not present

## 2022-05-06 DIAGNOSIS — G4733 Obstructive sleep apnea (adult) (pediatric): Secondary | ICD-10-CM | POA: Diagnosis not present

## 2022-05-06 DIAGNOSIS — G4700 Insomnia, unspecified: Secondary | ICD-10-CM | POA: Diagnosis not present

## 2022-06-05 DIAGNOSIS — R0683 Snoring: Secondary | ICD-10-CM | POA: Diagnosis not present

## 2022-06-05 DIAGNOSIS — G4700 Insomnia, unspecified: Secondary | ICD-10-CM | POA: Diagnosis not present

## 2022-06-05 DIAGNOSIS — G4733 Obstructive sleep apnea (adult) (pediatric): Secondary | ICD-10-CM | POA: Diagnosis not present

## 2022-06-15 ENCOUNTER — Telehealth: Payer: Self-pay | Admitting: Internal Medicine

## 2022-06-15 NOTE — Telephone Encounter (Signed)
Patient needs his Lisinopril called in to [facility]  Next Visit:  07/31/2022  Last Visit:  3/15/20923

## 2022-06-15 NOTE — Telephone Encounter (Signed)
Called patient and he is wanting to talk about a device to help him breath out regard his COPD. He states that his PCP suggest it to help him move his air in his lungs.   Please advise sir

## 2022-06-15 NOTE — Telephone Encounter (Signed)
Please advise as I do not see this medication on patient's med list

## 2022-06-16 NOTE — Telephone Encounter (Signed)
Pt has memory loss  Please let him know that he does not need lisinopril since he is on losartan 100 mg qd

## 2022-06-17 NOTE — Telephone Encounter (Signed)
Patient call me back and he states it is like the device that they give you in the hospital to help you after surgery.  So like a spiro device sir? Do you want me to sent in a flutter valve for him.   Please advise

## 2022-06-17 NOTE — Telephone Encounter (Signed)
I am not sure which kind of device Mr Babers is referring to. Doe he know a name, like Inspire or something else?

## 2022-06-17 NOTE — Telephone Encounter (Signed)
Called pt and left DVM to call back for the clarification of medication

## 2022-06-17 NOTE — Telephone Encounter (Signed)
Spoke to pt about not being on Lisinopril but Losartan instead based on Dr. Jonny Ruiz advice.

## 2022-06-17 NOTE — Telephone Encounter (Signed)
Called patient and left voicemail for him to call office back.

## 2022-06-18 NOTE — Telephone Encounter (Signed)
Called patient and told left voicemail for him that Dr Maple Hudson would discuss this issue with him at next office visit. Flutter valve would not benefit him. Nothing further needed

## 2022-06-18 NOTE — Telephone Encounter (Signed)
Flutter wouldn't helps sleep apnea. We can disccuss this next time he has an appointment.

## 2022-06-23 ENCOUNTER — Ambulatory Visit (INDEPENDENT_AMBULATORY_CARE_PROVIDER_SITE_OTHER): Payer: PPO

## 2022-06-23 ENCOUNTER — Ambulatory Visit (INDEPENDENT_AMBULATORY_CARE_PROVIDER_SITE_OTHER): Payer: PPO | Admitting: Internal Medicine

## 2022-06-23 ENCOUNTER — Encounter: Payer: Self-pay | Admitting: Internal Medicine

## 2022-06-23 VITALS — BP 130/70 | HR 72 | Temp 99.1°F | Ht 71.0 in | Wt 165.4 lb

## 2022-06-23 DIAGNOSIS — R051 Acute cough: Secondary | ICD-10-CM | POA: Diagnosis not present

## 2022-06-23 DIAGNOSIS — R350 Frequency of micturition: Secondary | ICD-10-CM | POA: Insufficient documentation

## 2022-06-23 DIAGNOSIS — E559 Vitamin D deficiency, unspecified: Secondary | ICD-10-CM

## 2022-06-23 DIAGNOSIS — E7800 Pure hypercholesterolemia, unspecified: Secondary | ICD-10-CM | POA: Diagnosis not present

## 2022-06-23 DIAGNOSIS — R059 Cough, unspecified: Secondary | ICD-10-CM | POA: Diagnosis not present

## 2022-06-23 DIAGNOSIS — R7302 Impaired glucose tolerance (oral): Secondary | ICD-10-CM

## 2022-06-23 DIAGNOSIS — R6883 Chills (without fever): Secondary | ICD-10-CM | POA: Insufficient documentation

## 2022-06-23 LAB — LIPID PANEL
Cholesterol: 116 mg/dL (ref 0–200)
HDL: 28.8 mg/dL — ABNORMAL LOW (ref >39.00)
LDL Cholesterol: 69 mg/dL (ref 0–99)
NonHDL: 87.41
Total CHOL/HDL Ratio: 4
Triglycerides: 93 mg/dL (ref 0.0–149.0)
VLDL: 18.6 mg/dL (ref 0.0–40.0)

## 2022-06-23 LAB — BASIC METABOLIC PANEL
BUN: 17 mg/dL (ref 6–23)
CO2: 26 mEq/L (ref 19–32)
Calcium: 9.5 mg/dL (ref 8.4–10.5)
Chloride: 100 mEq/L (ref 96–112)
Creatinine, Ser: 1.25 mg/dL (ref 0.40–1.50)
GFR: 56 mL/min — ABNORMAL LOW (ref >60.00)
Glucose, Bld: 100 mg/dL — ABNORMAL HIGH (ref 70–99)
Potassium: 4.2 mEq/L (ref 3.5–5.1)
Sodium: 131 mEq/L — ABNORMAL LOW (ref 135–145)

## 2022-06-23 LAB — HEPATIC FUNCTION PANEL
ALT: 35 U/L (ref 0–53)
AST: 28 U/L (ref 0–37)
Albumin: 4.1 g/dL (ref 3.5–5.2)
Alkaline Phosphatase: 76 U/L (ref 39–117)
Bilirubin, Direct: 0.2 mg/dL (ref 0.0–0.3)
Total Bilirubin: 1 mg/dL (ref 0.2–1.2)
Total Protein: 7.9 g/dL (ref 6.0–8.3)

## 2022-06-23 LAB — URINALYSIS, ROUTINE W REFLEX MICROSCOPIC
Bilirubin Urine: NEGATIVE
Hgb urine dipstick: NEGATIVE
Ketones, ur: NEGATIVE
Leukocytes,Ua: NEGATIVE
Nitrite: NEGATIVE
Specific Gravity, Urine: 1.01 (ref 1.000–1.030)
Urine Glucose: NEGATIVE
Urobilinogen, UA: 0.2 (ref 0.0–1.0)
pH: 7 (ref 5.0–8.0)

## 2022-06-23 LAB — CBC WITH DIFFERENTIAL/PLATELET
Basophils Absolute: 0.1 10*3/uL (ref 0.0–0.1)
Basophils Relative: 0.8 % (ref 0.0–3.0)
Eosinophils Absolute: 0.4 10*3/uL (ref 0.0–0.7)
Eosinophils Relative: 5 % (ref 0.0–5.0)
HCT: 40 % (ref 39.0–52.0)
Hemoglobin: 13.7 g/dL (ref 13.0–17.0)
Lymphocytes Relative: 19.7 % (ref 12.0–46.0)
Lymphs Abs: 1.5 10*3/uL (ref 0.7–4.0)
MCHC: 34.3 g/dL (ref 30.0–36.0)
MCV: 95.2 fl (ref 78.0–100.0)
Monocytes Absolute: 1.4 10*3/uL — ABNORMAL HIGH (ref 0.1–1.0)
Monocytes Relative: 18.2 % — ABNORMAL HIGH (ref 3.0–12.0)
Neutro Abs: 4.4 10*3/uL (ref 1.4–7.7)
Neutrophils Relative %: 56.3 % (ref 43.0–77.0)
Platelets: 326 10*3/uL (ref 150.0–400.0)
RBC: 4.2 Mil/uL — ABNORMAL LOW (ref 4.22–5.81)
RDW: 13 % (ref 11.5–15.5)
WBC: 7.7 10*3/uL (ref 4.0–10.5)

## 2022-06-23 LAB — TSH: TSH: 0.68 u[IU]/mL (ref 0.35–5.50)

## 2022-06-23 LAB — HEMOGLOBIN A1C: Hgb A1c MFr Bld: 6 % (ref 4.6–6.5)

## 2022-06-23 NOTE — Progress Notes (Signed)
Patient ID: Ryan Diaz, male   DOB: Nov 05, 1946, 76 y.o.   MRN: [id number]        Chief Complaint: follow up chills, and uinary frequency       HPI:  Ryan Diaz is a 76 y.o. male here with c/o  fatigue, wt loss, low appetite, low energy,cant taste food, worsening insomnia, and chills , and aslo mild urinary frequency but Denies urinary symptoms such as dysuria, frequency, urgency, flank pain, hematuria or n/v, fever, chills. Pt denies chest pain, increased sob or doe, wheezing, orthopnea, PND, increased LE swelling, palpitations, dizziness or syncope.   Pt denies polydipsia, polyuria, or new focal neuro s/s.          Wt Readings from Last 3 Encounters:  06/23/22 165 lb 6.4 oz (75 kg)  04/23/22 168 lb 12.8 oz (76.6 kg)  02/25/22 168 lb 12.8 oz (76.6 kg)   BP Readings from Last 3 Encounters:  06/23/22 130/70  04/23/22 136/70  02/25/22 (!) 150/82         Past Medical History:  Diagnosis Date   ABUSE, ALCOHOL, CONTINUOUS 08/08/2007   ALLERGIC RHINITIS 08/08/2007   ANXIETY 08/08/2007   Carpal tunnel syndrome    COPD 10/03/2009   DEPRESSION 08/08/2007   ERECTILE DYSFUNCTION 08/08/2007   GENITAL HERPES, HX OF 08/08/2007   GERD 08/08/2007   GLUCOSE INTOLERANCE 08/08/2007   HYPERLIPIDEMIA 08/08/2007   HYPERTENSION 08/08/2007   Impaired glucose tolerance 09/26/2011   Insomnia 05/18/2012   PAIN IN SOFT TISSUES OF LIMB 10/02/2009   Sleep apnea    OSA-uses CPAP nightly   Past Surgical History:  Procedure Laterality Date   ACHILLES TENDON REPAIR Bilateral    CARPAL TUNNEL RELEASE Right 08/18/2018   Procedure: RIGHT CARPAL TUNNEL RELEASE;  Surgeon: Betha Loa, MD;  Location: [facility];  Service: Orthopedics;  Laterality: Right;   CARPAL TUNNEL RELEASE Left 11/21/2018   Procedure: LEFT CARPAL TUNNEL RELEASE;  Surgeon: Betha Loa, MD;  Location: [facility];  Service: Orthopedics;  Laterality: Left;  Bier block   COLONOSCOPY     LEFT HEART CATH AND CORONARY  ANGIOGRAPHY N/A 10/17/2020   Procedure: LEFT HEART CATH AND CORONARY ANGIOGRAPHY;  Surgeon: Yvonne Kendall, MD;  Location: [facility];  Service: Cardiovascular;  Laterality: N/A;    reports that he quit smoking about 12 years ago. His smoking use included cigarettes. He has a 52.50 pack-year smoking history. He has never used smokeless tobacco. He reports current alcohol use of about 46.0 - 53.0 standard drinks of alcohol per week. He reports current drug use. Drug: Marijuana. family history includes Colon polyps in his brother. Allergies  Allergen Reactions   Sildenafil Palpitations   Current Outpatient Medications on File Prior to Visit  Medication Sig Dispense Refill   albuterol (ACCUNEB) 0.63 MG/3ML nebulizer solution Take 3 mLs (0.63 mg total) by nebulization every 6 (six) hours as needed for wheezing. 75 mL 12   albuterol (PROVENTIL HFA;VENTOLIN HFA) 108 (90 BASE) MCG/ACT inhaler Inhale 2 puffs into the lungs every 6 (six) hours as needed for wheezing or shortness of breath.     aspirin EC 81 MG tablet Take 81 mg by mouth daily.     atorvastatin (LIPITOR) 80 MG tablet Take 1 tablet (80 mg total) by mouth daily. 90 tablet 3   cetirizine (ZYRTEC) 10 MG tablet Take 1 tablet (10 mg total) by mouth daily. 90 tablet 3   chlorhexidine (PERIDEX) 0.12 % solution  Cholecalciferol (VITAMIN D) 50 MCG (2000 UT) tablet Take 1 tablet (2,000 Units total) by mouth daily. 90 tablet 3   clotrimazole (LOTRIMIN) 1 % cream Apply 1 application topically 2 (two) times daily as needed (irritation).     docusate sodium (COLACE) 100 MG capsule Take 100 mg by mouth daily as needed for mild constipation.     donepezil (ARICEPT) 5 MG tablet Take 1 tablet (5 mg total) by mouth at bedtime. 90 tablet 5   ergocalciferol (VITAMIN D2) 1.25 MG (50000 UT) capsule Take by mouth.     Ferrous Sulfate 27 MG TABS Take 27 mg by mouth daily as needed (pt prefrence).     fluocinonide ointment (LIDEX) 0.05 % Apply 1  application topically 2 (two) times daily. 30 g 2   fluticasone (CUTIVATE) 0.05 % cream Apply 1 application topically daily as needed (irritation).     Fluticasone-Umeclidin-Vilant (TRELEGY ELLIPTA) 200-62.5-25 MCG/ACT AEPB INHALE 1 PUFF INTO LUNGS ONCE DAILY 60 each 5   gabapentin (NEURONTIN) 100 MG capsule Take 1 capsule (100 mg total) by mouth 3 (three) times daily. (Patient taking differently: Take 100 mg by mouth 3 (three) times daily as needed (pain).) 90 capsule 5   ipratropium (ATROVENT) 0.06 % nasal spray Place 2 sprays into both nostrils 3 (three) times daily. 15 mL 5   losartan (COZAAR) 100 MG tablet Take 1 tablet (100 mg total) by mouth daily. 90 tablet 3   meloxicam (MOBIC) 15 MG tablet 1 tab by mouth once daily as needed (Patient taking differently: Take 15 mg by mouth daily as needed for pain. 1 tab by mouth once daily as needed) 90 tablet 1   nitroGLYCERIN (NITROSTAT) 0.4 MG SL tablet Place 1 tablet (0.4 mg total) under the tongue every 5 (five) minutes as needed for chest pain. 25 tablet 4   Omega-3 Fatty Acids (FISH OIL) 1000 MG CAPS Take 1,000 mg by mouth 2 (two) times daily.     omeprazole (PRILOSEC) 20 MG capsule Take 1 capsule (20 mg total) by mouth 2 (two) times daily. (Patient taking differently: Take 20 mg by mouth 2 (two) times daily as needed (acid reflux).) 90 capsule 3   tadalafil (CIALIS) 20 MG tablet Take 20 mg by mouth daily as needed for erectile dysfunction.     terazosin (HYTRIN) 2 MG capsule Take 1 capsule (2 mg total) by mouth at bedtime. 90 capsule 3   traZODone (DESYREL) 50 MG tablet Take 50 mg by mouth at bedtime as needed for sleep.     No current facility-administered medications on file prior to visit.        ROS:  All others reviewed and negative.  Objective        PE:  BP 130/70 (BP Location: Left Arm, Patient Position: Sitting, Cuff Size: Normal)   Pulse 72   Temp 99.1 F (37.3 C) (Oral)   Ht 5\' 11"  (1.803 m)   Wt 165 lb 6.4 oz (75 kg)   SpO2  95%   BMI 23.07 kg/m                 Constitutional: Pt appears in NAD               HENT: Head: NCAT.                Right Ear: External ear normal.                 Left Ear: External ear normal.  Eyes: . Pupils are equal, round, and reactive to light. Conjunctivae and EOM are normal               Nose: without d/c or deformity               Neck: Neck supple. Gross normal ROM               Cardiovascular: Normal rate and regular rhythm.                 Pulmonary/Chest: Effort normal and breath sounds without rales or wheezing but decreased to left lower lung field compared to right               Abd:  Soft, NT, ND, + BS, no organomegaly               Neurological: Pt is alert. At baseline orientation, motor grossly intact               Skin: Skin is warm. No rashes, no other new lesions, LE edema - none               Psychiatric: Pt behavior is normal without agitation   Micro: none  Cardiac tracings I have personally interpreted today:  none  Pertinent Radiological findings (summarize): none   Lab Results  Component Value Date   WBC 7.7 06/23/2022   HGB 13.7 06/23/2022   HCT 40.0 06/23/2022   PLT 326.0 06/23/2022   GLUCOSE 100 (H) 06/23/2022   CHOL 116 06/23/2022   TRIG 93.0 06/23/2022   HDL 28.80 (L) 06/23/2022   LDLDIRECT 96.0 05/13/2021   LDLCALC 69 06/23/2022   ALT 35 06/23/2022   AST 28 06/23/2022   NA 131 (L) 06/23/2022   K 4.2 06/23/2022   CL 100 06/23/2022   CREATININE 1.25 06/23/2022   BUN 17 06/23/2022   CO2 26 06/23/2022   TSH 0.68 06/23/2022   PSA 1.57 01/28/2022   HGBA1C 6.0 06/23/2022   Assessment/Plan:  Ryan Diaz is a 76 y.o. Black or African American [2] male with  has a past medical history of ABUSE, ALCOHOL, CONTINUOUS (08/08/2007), ALLERGIC RHINITIS (08/08/2007), ANXIETY (08/08/2007), Carpal tunnel syndrome, COPD (10/03/2009), DEPRESSION (08/08/2007), ERECTILE DYSFUNCTION (08/08/2007), GENITAL HERPES, HX OF (08/08/2007), GERD  (08/08/2007), GLUCOSE INTOLERANCE (08/08/2007), HYPERLIPIDEMIA (08/08/2007), HYPERTENSION (08/08/2007), Impaired glucose tolerance (09/26/2011), Insomnia (05/18/2012), PAIN IN SOFT TISSUES OF LIMB (10/02/2009), and Sleep apnea.  HLD (hyperlipidemia) Lab Results  Component Value Date   LDLCALC 69 06/23/2022   Stable, pt to continue current statin lipitor 80 mg qd   Impaired glucose tolerance Lab Results  Component Value Date   HGBA1C 6.0 06/23/2022   Stable, pt to continue current medical treatment  - diet, wt control   Vitamin D deficiency Last vitamin D Lab Results  Component Value Date   VD25OH 39.44 01/28/2022   Low, to start oral replacement   Cough Exam benign, etiology unclear, for cxr today  Chills (without fever) Also for cxr,  to f/u any worsening symptoms or concerns  Urinary frequency Exam benign, also for ua and culture  Followup: Return in about 6 months (around 12/24/2022).  Oliver Barre, MD 06/23/2022 9:46 PM [facility]

## 2022-06-23 NOTE — Assessment & Plan Note (Signed)
Lab Results  Component Value Date   HGBA1C 6.0 06/23/2022   Stable, pt to continue current medical treatment  - diet, wt control

## 2022-06-23 NOTE — Assessment & Plan Note (Addendum)
Exam benign, etiology unclear, for cxr today

## 2022-06-23 NOTE — Patient Instructions (Signed)
Ok to continue the losartan at 100 mg per day  Please continue all other medications as before, and refills have been done if requested.  Please have the pharmacy call with any other refills you may need.  Please continue your efforts at being more active, low cholesterol diet, and weight control..  Please keep your appointments with your specialists as you may have planned  Please go to the XRAY Department in the first floor for the x-ray testing  Please go to the LAB at the blood drawing area for the tests to be done  You will be contacted by phone if any changes need to be made immediately.  Otherwise, you will receive a letter about your results with an explanation, but please check with MyChart first.  Please remember to sign up for MyChart if you have not done so, as this will be important to you in the future with finding out test results, communicating by private email, and scheduling acute appointments online when needed.  Please make an Appointment to return in 6 months, or sooner if needed

## 2022-06-23 NOTE — Assessment & Plan Note (Signed)
Also for cxr,  to f/u any worsening symptoms or concerns 

## 2022-06-23 NOTE — Assessment & Plan Note (Signed)
Lab Results  Component Value Date   LDLCALC 69 06/23/2022   Stable, pt to continue current statin lipitor 80 mg qd

## 2022-06-23 NOTE — Assessment & Plan Note (Signed)
Exam benign, also for ua and culture

## 2022-06-23 NOTE — Assessment & Plan Note (Signed)
Last vitamin D Lab Results  Component Value Date   VD25OH 39.44 01/28/2022   Low, to start oral replacement

## 2022-06-24 LAB — URINE CULTURE: Result:: NO GROWTH

## 2022-06-25 ENCOUNTER — Telehealth: Payer: Self-pay | Admitting: Internal Medicine

## 2022-06-25 NOTE — Telephone Encounter (Signed)
Patient would like to be called with his lab results from this week.

## 2022-07-02 ENCOUNTER — Encounter: Payer: Self-pay | Admitting: Internal Medicine

## 2022-07-05 NOTE — Progress Notes (Unsigned)
HPI male former smoker followed for OSA, insomnia, complicated by ETOH, COPD, GERD, HBP NPSG 10/31/13- AHI 52/hour, desaturation to 83%, body weight 165 pounds Unattended Home Sleep Test 09/02/2016- AHI 9.4/hour, desaturation to 87%, body weight 163 pounds Office Spirometry 01/2019-moderate obstructive airways disease.  FVC 3.6/92%, FEV1 2.0/67%, ratio 1.54, FEF 25-75% 1.9/45% PFT 10/16/20- moderately severe obstruction, some resp to BD, mod severe DLCO deficit, emphysema pattern FEV1/FVC 0.52 Walk test room air 01/22/21- lowest sat 91%, max HR 10 -------------------------------------------------------------------------------------   04/23/22- 76 year old male former smoker followed for OSA, insomnia, complicated by history EtOH, COPD, GERD, HTN, CPAP auto 5-20/[employer]/ [employer]  AirSense 11 AutoSet   -Albuterol hfa, Trelegy 200, Atrovent nasal,   Trazodone 50 Download-compliance 77%, AHI 8.8/ hr    Body weight today-168 lbs Covid vax-4 Phizer Flu vax- ------OSA,states no problems Download reviewed.  Feels better with CPAP.  No concerns. Stable dyspnea on exertion.  Sometimes gets a burning sensation after taking out trash cans but no problem at all and [facility]  He had cardiac work-up in the last couple of years. Does not find that he needs rescue inhaler at all but does use his Trelegy.  07/06/22- 76 year old male former smoker followed for OSA, insomnia, complicated by history EtOH, COPD, GERD, HTN, CPAP auto 5-20/[employer]/ [employer]  AirSense 11 AutoSet   -Albuterol hfa, Trelegy 200, Atrovent nasal,   Trazodone 50 Download compliance- Body weight today-  CXR 06/23/22- IMPRESSION: No active cardiopulmonary disease.   ROS-see HPI   + = positive Constitutional:    weight loss, night sweats, fevers, chills, +fatigue, lassitude. HEENT:    headaches, difficulty swallowing, tooth/dental problems, sore throat,       sneezing, itching, ear ache, nasal congestion, post  nasal drip, snoring CV:    +chest pain, orthopnea, PND, swelling in lower extremities, anasarca,                                  izziness, palpitations Resp:   +shortness of breath with exertion or at rest.                productive cough,   non-productive cough, coughing up of blood.              change in color of mucus.  wheezing.   Skin:    rash or lesions. GI:  + heartburn, indigestion, abdominal pain, nausea, vomiting, diarrhea,                 change in bowel habits, loss of appetite GU: dysuria, change in color of urine, no urgency or frequency.   flank pain. MS:   joint pain, stiffness, decreased range of motion, back pain. Neuro-     nothing unusual Psych:  change in mood or affect.  depression or anxiety.   memory loss.  OBJ- Physical Exam                   General- Alert, Oriented, Affect-appropriate, Distress- none acute, + not obese Skin- rash-none, lesions- none, excoriation- none Lymphadenopathy- none Head- atraumatic            Eyes- Gross vision intact, PERRLA, conjunctivae and secretions clear            Ears- Hearing, canals-normal            Nose- Clear, no-Septal dev, mucus, polyps, erosion, perforation  Throat- Mallampati III-IV , mucosa clear , drainage- none, tonsils- atrophic Neck- flexible , trachea midline, no stridor , thyroid nl, carotid no bruit Chest - symmetrical excursion , unlabored           Heart/CV- RRR , no murmur , no gallop  , no rub, nl s1 s2                           - JVD- none , edema- none, stasis changes- none, varices- none           Lung- clear to P&A, wheeze- none, cough- none , dullness-none, rub- none           Chest wall-  Abd-  Br/ Gen/ Rectal- Not done, not indicated Extrem- cyanosis- none, clubbing, none, atrophy- none, strength- nl Neuro- grossly intact to observation

## 2022-07-06 ENCOUNTER — Ambulatory Visit (INDEPENDENT_AMBULATORY_CARE_PROVIDER_SITE_OTHER): Payer: PPO | Admitting: Internal Medicine

## 2022-07-06 ENCOUNTER — Encounter: Payer: Self-pay | Admitting: Internal Medicine

## 2022-07-06 VITALS — BP 112/62 | HR 71 | Ht 70.0 in | Wt 163.6 lb

## 2022-07-06 DIAGNOSIS — I2510 Atherosclerotic heart disease of native coronary artery without angina pectoris: Secondary | ICD-10-CM | POA: Diagnosis not present

## 2022-07-06 DIAGNOSIS — J449 Chronic obstructive pulmonary disease, unspecified: Secondary | ICD-10-CM | POA: Diagnosis not present

## 2022-07-06 DIAGNOSIS — R0683 Snoring: Secondary | ICD-10-CM | POA: Diagnosis not present

## 2022-07-06 DIAGNOSIS — J432 Centrilobular emphysema: Secondary | ICD-10-CM | POA: Diagnosis not present

## 2022-07-06 DIAGNOSIS — G4733 Obstructive sleep apnea (adult) (pediatric): Secondary | ICD-10-CM | POA: Diagnosis not present

## 2022-07-06 DIAGNOSIS — G4700 Insomnia, unspecified: Secondary | ICD-10-CM | POA: Diagnosis not present

## 2022-07-06 MED ORDER — AEROCHAMBER MV MISC: 0 refills | Status: DC

## 2022-07-06 MED ORDER — BREZTRI AEROSPHERE 160-9-4.8 MCG/ACT IN AERO: 2.0000 | INHALATION_SPRAY | Freq: Two times a day (BID) | RESPIRATORY_TRACT | 6 refills | Status: DC

## 2022-07-06 MED ORDER — AEROCHAMBER MV MISC: 0 refills | Status: AC

## 2022-07-06 NOTE — Assessment & Plan Note (Signed)
I explained that his dyspnea on exertion may include a cardiac component and he should discuss with Dr Jonny Ruiz since he does not have f/u apt with cardiology.

## 2022-07-06 NOTE — Assessment & Plan Note (Signed)
Not clear how much is normal progression of COPD. We will repeat walk test and PFT for comparison with prior studies looking for objective evidence of change.  We also plan trial of Breztri sample, AeroChamber (he requests) and use of rescue inhaler before exercise. I suggested he talk with Dr. Jonny Ruiz about seeking a cardiac stress test.

## 2022-07-06 NOTE — Addendum Note (Signed)
Addended by: Jaynee Eagles on: 07/06/2022 10:11 AM   Modules accepted: Orders

## 2022-07-06 NOTE — Patient Instructions (Signed)
Order- walk test on room air    dx Dyspnea on exertion  Order- schedule PFT  dx DOE  Order- sample x 1 Breztri inhaler      inhale 2 puff then rinse mouth, two times daily every day. While you are using it, see if your breathing improves.  Ok to use your albuterol rescue inhaler shortly before you exercise- see if that helps  Suggest you talk with Dr Jonny Ruiz about getting a cardiac stress test.

## 2022-07-07 ENCOUNTER — Ambulatory Visit (INDEPENDENT_AMBULATORY_CARE_PROVIDER_SITE_OTHER): Payer: PPO | Admitting: Internal Medicine

## 2022-07-07 DIAGNOSIS — J449 Chronic obstructive pulmonary disease, unspecified: Secondary | ICD-10-CM | POA: Diagnosis not present

## 2022-07-07 LAB — PULMONARY FUNCTION TEST
DL/VA % pred: 46 %
DL/VA: 1.85 ml/min/mmHg/L
DLCO cor % pred: 46 %
DLCO cor: 11.57 ml/min/mmHg
DLCO unc % pred: 45 %
DLCO unc: 11.27 ml/min/mmHg
FEF 25-75 Post: 1.08 L/sec
FEF 25-75 Pre: 0.71 L/sec
FEF2575-%Change-Post: 51 %
FEF2575-%Pred-Post: 49 %
FEF2575-%Pred-Pre: 32 %
FEV1-%Change-Post: 11 %
FEV1-%Pred-Post: 61 %
FEV1-%Pred-Pre: 55 %
FEV1-Post: 1.86 L
FEV1-Pre: 1.67 L
FEV1FVC-%Change-Post: 0 %
FEV1FVC-%Pred-Pre: 74 %
FEV6-%Change-Post: 11 %
FEV6-%Pred-Post: 84 %
FEV6-%Pred-Pre: 76 %
FEV6-Post: 3.33 L
FEV6-Pre: 3 L
FEV6FVC-%Change-Post: 0 %
FEV6FVC-%Pred-Post: 103 %
FEV6FVC-%Pred-Pre: 104 %
FVC-%Change-Post: 11 %
FVC-%Pred-Post: 81 %
FVC-%Pred-Pre: 73 %
FVC-Post: 3.43 L
FVC-Pre: 3.08 L
Post FEV1/FVC ratio: 54 %
Post FEV6/FVC ratio: 97 %
Pre FEV1/FVC ratio: 54 %
Pre FEV6/FVC Ratio: 98 %
RV % pred: 137 %
RV: 3.53 L
TLC % pred: 97 %
TLC: 6.87 L

## 2022-07-07 NOTE — Patient Instructions (Signed)
Full PFT Performed Today  

## 2022-07-07 NOTE — Progress Notes (Signed)
Full PFT Performed Today  

## 2022-07-09 DIAGNOSIS — G4733 Obstructive sleep apnea (adult) (pediatric): Secondary | ICD-10-CM | POA: Diagnosis not present

## 2022-07-09 DIAGNOSIS — G4700 Insomnia, unspecified: Secondary | ICD-10-CM | POA: Diagnosis not present

## 2022-07-13 ENCOUNTER — Telehealth: Payer: Self-pay | Admitting: Internal Medicine

## 2022-07-13 MED ORDER — ATORVASTATIN CALCIUM 80 MG PO TABS: 80.0000 mg | ORAL_TABLET | Freq: Every day | ORAL | 1 refills | Status: DC

## 2022-07-13 NOTE — Telephone Encounter (Signed)
Patient needs a refill on his atorvastatin - Please send to [facility] - [address].

## 2022-07-15 NOTE — Telephone Encounter (Signed)
His pulmonary function test showed moderately severe COPD  Please order (DME)  compressor nebulizer machine                                   Duoneb neb solution # 360 ml, 1 neb every 6 hours if needed, ref x 12  See if this helps

## 2022-07-15 NOTE — Telephone Encounter (Signed)
Spoke to patient. He is requesting PFT results from 07/07/2022.  Dr. Maple Hudson, please advise. Thanks

## 2022-07-15 NOTE — Telephone Encounter (Signed)
Lm x1 for patient.  

## 2022-07-16 NOTE — Telephone Encounter (Signed)
Attempted to call pt but unable to reach. Left message for him to return call. Due to multiple attempts trying to reach pt without being able to, per protocol encounter will be closed.

## 2022-07-17 ENCOUNTER — Telehealth: Payer: Self-pay | Admitting: Internal Medicine

## 2022-07-17 DIAGNOSIS — J449 Chronic obstructive pulmonary disease, unspecified: Secondary | ICD-10-CM

## 2022-07-17 NOTE — Telephone Encounter (Signed)
I left a message for the patient to call back 

## 2022-07-21 NOTE — Telephone Encounter (Signed)
ATC patient.  LM to call back for PFT results.   From 07/13/22 closed encounter. PFT results from Dr. Maple Hudson 07/15/22-  Note   His pulmonary function test showed moderately severe COPD   Please order (DME)  compressor nebulizer machine                                   Duoneb neb solution # 360 ml, 1 neb every 6 hours if needed, ref x 12   See if this helps

## 2022-07-22 MED ORDER — BREZTRI AEROSPHERE 160-9-4.8 MCG/ACT IN AERO: 2.0000 | INHALATION_SPRAY | Freq: Two times a day (BID) | RESPIRATORY_TRACT | 6 refills | Status: DC

## 2022-07-22 MED ORDER — IPRATROPIUM-ALBUTEROL 0.5-2.5 (3) MG/3ML IN SOLN: 3.0000 mL | Freq: Four times a day (QID) | RESPIRATORY_TRACT | 12 refills | Status: DC | PRN

## 2022-07-22 NOTE — Telephone Encounter (Signed)
Called and spoke with pt letting him know the results of the PFT and he verbalized understanding. Rx for neb machine and solution have been sent for pt. Nothing further needed.

## 2022-07-24 DIAGNOSIS — R0683 Snoring: Secondary | ICD-10-CM | POA: Diagnosis not present

## 2022-07-24 DIAGNOSIS — G4733 Obstructive sleep apnea (adult) (pediatric): Secondary | ICD-10-CM | POA: Diagnosis not present

## 2022-07-24 DIAGNOSIS — G4700 Insomnia, unspecified: Secondary | ICD-10-CM | POA: Diagnosis not present

## 2022-07-31 ENCOUNTER — Ambulatory Visit (INDEPENDENT_AMBULATORY_CARE_PROVIDER_SITE_OTHER): Payer: PPO | Admitting: Internal Medicine

## 2022-07-31 VITALS — BP 122/68 | HR 71 | Temp 97.8°F | Ht 70.0 in | Wt 167.0 lb

## 2022-07-31 DIAGNOSIS — I10 Essential (primary) hypertension: Secondary | ICD-10-CM | POA: Diagnosis not present

## 2022-07-31 DIAGNOSIS — Z23 Encounter for immunization: Secondary | ICD-10-CM | POA: Diagnosis not present

## 2022-07-31 DIAGNOSIS — F0390 Unspecified dementia without behavioral disturbance: Secondary | ICD-10-CM | POA: Insufficient documentation

## 2022-07-31 DIAGNOSIS — R7302 Impaired glucose tolerance (oral): Secondary | ICD-10-CM | POA: Diagnosis not present

## 2022-07-31 DIAGNOSIS — E871 Hypo-osmolality and hyponatremia: Secondary | ICD-10-CM

## 2022-07-31 DIAGNOSIS — E559 Vitamin D deficiency, unspecified: Secondary | ICD-10-CM

## 2022-07-31 DIAGNOSIS — F03A4 Unspecified dementia, mild, with anxiety: Secondary | ICD-10-CM | POA: Diagnosis not present

## 2022-07-31 DIAGNOSIS — E7800 Pure hypercholesterolemia, unspecified: Secondary | ICD-10-CM | POA: Diagnosis not present

## 2022-07-31 LAB — CBC WITH DIFFERENTIAL/PLATELET
Basophils Absolute: 0 10*3/uL (ref 0.0–0.1)
Basophils Relative: 0.6 % (ref 0.0–3.0)
Eosinophils Absolute: 0.2 10*3/uL (ref 0.0–0.7)
Eosinophils Relative: 3.1 % (ref 0.0–5.0)
HCT: 42.3 % (ref 39.0–52.0)
Hemoglobin: 14.2 g/dL (ref 13.0–17.0)
Lymphocytes Relative: 26.1 % (ref 12.0–46.0)
Lymphs Abs: 1.8 10*3/uL (ref 0.7–4.0)
MCHC: 33.6 g/dL (ref 30.0–36.0)
MCV: 96.8 fl (ref 78.0–100.0)
Monocytes Absolute: 0.9 10*3/uL (ref 0.1–1.0)
Monocytes Relative: 12.7 % — ABNORMAL HIGH (ref 3.0–12.0)
Neutro Abs: 4.1 10*3/uL (ref 1.4–7.7)
Neutrophils Relative %: 57.5 % (ref 43.0–77.0)
Platelets: 249 10*3/uL (ref 150.0–400.0)
RBC: 4.36 Mil/uL (ref 4.22–5.81)
RDW: 13.5 % (ref 11.5–15.5)
WBC: 7.1 10*3/uL (ref 4.0–10.5)

## 2022-07-31 LAB — HEPATIC FUNCTION PANEL
ALT: 25 U/L (ref 0–53)
AST: 20 U/L (ref 0–37)
Albumin: 4.1 g/dL (ref 3.5–5.2)
Alkaline Phosphatase: 73 U/L (ref 39–117)
Bilirubin, Direct: 0.1 mg/dL (ref 0.0–0.3)
Total Bilirubin: 0.6 mg/dL (ref 0.2–1.2)
Total Protein: 7.5 g/dL (ref 6.0–8.3)

## 2022-07-31 LAB — LIPID PANEL
Cholesterol: 143 mg/dL (ref 0–200)
HDL: 43.6 mg/dL (ref >39.00)
LDL Cholesterol: 65 mg/dL (ref 0–99)
NonHDL: 99.03
Total CHOL/HDL Ratio: 3
Triglycerides: 171 mg/dL — ABNORMAL HIGH (ref 0.0–149.0)
VLDL: 34.2 mg/dL (ref 0.0–40.0)

## 2022-07-31 LAB — BASIC METABOLIC PANEL
BUN: 22 mg/dL (ref 6–23)
CO2: 29 mEq/L (ref 19–32)
Calcium: 9.5 mg/dL (ref 8.4–10.5)
Chloride: 104 mEq/L (ref 96–112)
Creatinine, Ser: 1.56 mg/dL — ABNORMAL HIGH (ref 0.40–1.50)
GFR: 42.89 mL/min — ABNORMAL LOW (ref >60.00)
Glucose, Bld: 74 mg/dL (ref 70–99)
Potassium: 4 mEq/L (ref 3.5–5.1)
Sodium: 139 mEq/L (ref 135–145)

## 2022-07-31 LAB — HEMOGLOBIN A1C: Hgb A1c MFr Bld: 5.8 % (ref 4.6–6.5)

## 2022-07-31 LAB — TSH: TSH: 0.89 u[IU]/mL (ref 0.35–5.50)

## 2022-07-31 LAB — VITAMIN B12: Vitamin B-12: 456 pg/mL (ref 211–911)

## 2022-07-31 MED ORDER — MEMANTINE HCL 10 MG PO TABS: 10.0000 mg | ORAL_TABLET | Freq: Two times a day (BID) | ORAL | 3 refills | Status: DC

## 2022-07-31 MED ORDER — LOSARTAN POTASSIUM 100 MG PO TABS: 100.0000 mg | ORAL_TABLET | Freq: Every day | ORAL | 3 refills | Status: AC

## 2022-07-31 MED ORDER — DONEPEZIL HCL 10 MG PO TABS: 10.0000 mg | ORAL_TABLET | Freq: Every day | ORAL | 3 refills | Status: DC

## 2022-07-31 MED ORDER — VITAMIN B-12 1000 MCG PO TABS: 1000.0000 ug | ORAL_TABLET | Freq: Every day | ORAL | 3 refills | Status: AC

## 2022-07-31 NOTE — Progress Notes (Unsigned)
Patient ID: Ryan Diaz, male   DOB: 07-20-1946, 76 y.o.   MRN: [id number]        Chief Complaint: follow up HTN, dementia, low sodium       HPI:  Ryan Diaz is a 76 y.o. male here overall doing ok with family present, memory loss is worsening but no worsening behavior issue except mild anxiety, family asks for maximal tx.  Pt denies chest pain, increased sob or doe, wheezing, orthopnea, PND, increased LE swelling, palpitations, dizziness or syncope.   Pt denies polydipsia, polyuria, or new focal neuro s/s.   Pt denies fever, wt loss, night sweats, loss of appetite, or other constitutional symptoms         Wt Readings from Last 3 Encounters:  07/31/22 167 lb (75.8 kg)  07/06/22 163 lb 9.6 oz (74.2 kg)  06/23/22 165 lb 6.4 oz (75 kg)   BP Readings from Last 3 Encounters:  07/31/22 122/68  07/06/22 112/62  06/23/22 130/70         Past Medical History:  Diagnosis Date   ABUSE, ALCOHOL, CONTINUOUS 08/08/2007   ALLERGIC RHINITIS 08/08/2007   ANXIETY 08/08/2007   Carpal tunnel syndrome    COPD 10/03/2009   DEPRESSION 08/08/2007   ERECTILE DYSFUNCTION 08/08/2007   GENITAL HERPES, HX OF 08/08/2007   GERD 08/08/2007   GLUCOSE INTOLERANCE 08/08/2007   HYPERLIPIDEMIA 08/08/2007   HYPERTENSION 08/08/2007   Impaired glucose tolerance 09/26/2011   Insomnia 05/18/2012   PAIN IN SOFT TISSUES OF LIMB 10/02/2009   Sleep apnea    OSA-uses CPAP nightly   Past Surgical History:  Procedure Laterality Date   ACHILLES TENDON REPAIR Bilateral    CARPAL TUNNEL RELEASE Right 08/18/2018   Procedure: RIGHT CARPAL TUNNEL RELEASE;  Surgeon: Betha Loa, MD;  Location: [facility];  Service: Orthopedics;  Laterality: Right;   CARPAL TUNNEL RELEASE Left 11/21/2018   Procedure: LEFT CARPAL TUNNEL RELEASE;  Surgeon: Betha Loa, MD;  Location: [facility];  Service: Orthopedics;  Laterality: Left;  Bier block   COLONOSCOPY     LEFT HEART CATH AND CORONARY ANGIOGRAPHY N/A 10/17/2020    Procedure: LEFT HEART CATH AND CORONARY ANGIOGRAPHY;  Surgeon: Yvonne Kendall, MD;  Location: [facility];  Service: Cardiovascular;  Laterality: N/A;    reports that he quit smoking about 12 years ago. His smoking use included cigarettes. He has a 52.50 pack-year smoking history. He has never used smokeless tobacco. He reports current alcohol use of about 46.0 - 53.0 standard drinks of alcohol per week. He reports current drug use. Drug: Marijuana. family history includes Colon polyps in his brother. Allergies  Allergen Reactions   Sildenafil Palpitations   Current Outpatient Medications on File Prior to Visit  Medication Sig Dispense Refill   albuterol (ACCUNEB) 0.63 MG/3ML nebulizer solution Take 3 mLs (0.63 mg total) by nebulization every 6 (six) hours as needed for wheezing. 75 mL 12   albuterol (PROVENTIL HFA;VENTOLIN HFA) 108 (90 BASE) MCG/ACT inhaler Inhale 2 puffs into the lungs every 6 (six) hours as needed for wheezing or shortness of breath.     aspirin EC 81 MG tablet Take 81 mg by mouth daily.     atorvastatin (LIPITOR) 80 MG tablet Take 1 tablet (80 mg total) by mouth daily. 90 tablet 1   Budeson-Glycopyrrol-Formoterol (BREZTRI AEROSPHERE) 160-9-4.8 MCG/ACT AERO Inhale 2 puffs into the lungs in the morning and at bedtime. 10.7 g 6   cetirizine (ZYRTEC) 10 MG tablet  Take 1 tablet (10 mg total) by mouth daily. 90 tablet 3   chlorhexidine (PERIDEX) 0.12 % solution      Cholecalciferol (VITAMIN D) 50 MCG (2000 UT) tablet Take 1 tablet (2,000 Units total) by mouth daily. 90 tablet 3   clotrimazole (LOTRIMIN) 1 % cream Apply 1 application topically 2 (two) times daily as needed (irritation).     docusate sodium (COLACE) 100 MG capsule Take 100 mg by mouth daily as needed for mild constipation.     Ferrous Sulfate 27 MG TABS Take 27 mg by mouth daily as needed (pt prefrence).     fluocinonide ointment (LIDEX) [id number] % Apply 1 application topically 2 (two) times daily. 30 g 2    fluticasone (CUTIVATE) 0.05 % cream Apply 1 application topically daily as needed (irritation).     gabapentin (NEURONTIN) 100 MG capsule Take 1 capsule (100 mg total) by mouth 3 (three) times daily. (Patient taking differently: Take 100 mg by mouth 3 (three) times daily as needed (pain).) 90 capsule 5   ipratropium (ATROVENT) 0.06 % nasal spray Place 2 sprays into both nostrils 3 (three) times daily. 15 mL 5   ipratropium-albuterol (DUONEB) 0.5-2.5 (3) MG/3ML SOLN Take 3 mLs by nebulization every 6 (six) hours as needed. 360 mL 12   meloxicam (MOBIC) 15 MG tablet 1 tab by mouth once daily as needed (Patient taking differently: Take 15 mg by mouth daily as needed for pain. 1 tab by mouth once daily as needed) 90 tablet 1   nitroGLYCERIN (NITROSTAT) 0.4 MG SL tablet Place 1 tablet (0.4 mg total) under the tongue every 5 (five) minutes as needed for chest pain. 25 tablet 4   Omega-3 Fatty Acids (FISH OIL) 1000 MG CAPS Take 1,000 mg by mouth 2 (two) times daily.     omeprazole (PRILOSEC) 20 MG capsule Take 1 capsule (20 mg total) by mouth 2 (two) times daily. (Patient taking differently: Take 20 mg by mouth 2 (two) times daily as needed (acid reflux).) 90 capsule 3   pantoprazole (PROTONIX) 40 MG tablet TAKE ONE TABLET BY MOUTH ONCE EVERY DAY TO CONTROL STOMACH ACID     Spacer/Aero-Holding Chambers (AEROCHAMBER MV) inhaler Use as instructed 1 each 0   tadalafil (CIALIS) 20 MG tablet Take 20 mg by mouth daily as needed for erectile dysfunction.     terazosin (HYTRIN) 2 MG capsule Take 1 capsule (2 mg total) by mouth at bedtime. 90 capsule 3   traZODone (DESYREL) 50 MG tablet Take 50 mg by mouth at bedtime as needed for sleep.     No current facility-administered medications on file prior to visit.        ROS:  All others reviewed and negative.  Objective        PE:  BP 122/68 (BP Location: Left Arm, Patient Position: Sitting, Cuff Size: Large)   Pulse 71   Temp 97.8 F (36.6 C) (Oral)   Ht 5\' 10"   (1.778 m)   Wt 167 lb (75.8 kg)   SpO2 95%   BMI 23.96 kg/m                 Constitutional: Pt appears in NAD               HENT: Head: NCAT.                Right Ear: External ear normal.                 Left  Ear: External ear normal.                Eyes: . Pupils are equal, round, and reactive to light. Conjunctivae and EOM are normal               Nose: without d/c or deformity               Neck: Neck supple. Gross normal ROM               Cardiovascular: Normal rate and regular rhythm.                 Pulmonary/Chest: Effort normal and breath sounds without rales or wheezing.                Abd:  Soft, NT, ND, + BS, no organomegaly               Neurological: Pt is alert. At baseline orientation, motor grossly intact               Skin: Skin is warm. No rashes, no other new lesions, LE edema - trace bilateral               Psychiatric: Pt behavior is normal without agitation   Micro: none  Cardiac tracings I have personally interpreted today:  none  Pertinent Radiological findings (summarize): none   Lab Results  Component Value Date   WBC 7.1 07/31/2022   HGB 14.2 07/31/2022   HCT 42.3 07/31/2022   PLT 249.0 07/31/2022   GLUCOSE 74 07/31/2022   CHOL 143 07/31/2022   TRIG 171.0 (H) 07/31/2022   HDL 43.60 07/31/2022   LDLDIRECT 96.0 05/13/2021   LDLCALC 65 07/31/2022   ALT 25 07/31/2022   AST 20 07/31/2022   NA 139 07/31/2022   K 4.0 07/31/2022   CL 104 07/31/2022   CREATININE 1.56 (H) 07/31/2022   BUN 22 07/31/2022   CO2 29 07/31/2022   TSH 0.89 07/31/2022   PSA 1.57 01/28/2022   HGBA1C 5.8 07/31/2022   Assessment/Plan:  Ryan Diaz is a 76 y.o. Black or African American [2] male with  has a past medical history of ABUSE, ALCOHOL, CONTINUOUS (08/08/2007), ALLERGIC RHINITIS (08/08/2007), ANXIETY (08/08/2007), Carpal tunnel syndrome, COPD (10/03/2009), DEPRESSION (08/08/2007), ERECTILE DYSFUNCTION (08/08/2007), GENITAL HERPES, HX OF (08/08/2007), GERD (08/08/2007),  GLUCOSE INTOLERANCE (08/08/2007), HYPERLIPIDEMIA (08/08/2007), HYPERTENSION (08/08/2007), Impaired glucose tolerance (09/26/2011), Insomnia (05/18/2012), PAIN IN SOFT TISSUES OF LIMB (10/02/2009), and Sleep apnea.  Vitamin D deficiency Last vitamin D Lab Results  Component Value Date   VD25OH 39.44 01/28/2022   Low, reminded to start oral replacement   Dementia (HCC) With worsening loss now at least moderate - for increased aricept 10 mg qd, and namenda 5 bid, declines neuro referral  Essential hypertension BP Readings from Last 3 Encounters:  07/31/22 122/68  07/06/22 112/62  06/23/22 130/70   Stable, pt to continue medical treatment losartan 100 mg qd   HLD (hyperlipidemia) Lab Results  Component Value Date   LDLCALC 65 07/31/2022   Stable, pt to continue current statin lipitor 80 mg qd   Impaired glucose tolerance Lab Results  Component Value Date   HGBA1C 5.8 07/31/2022   Stable, pt to continue current medical treatment  - diet, wt control, excercise   Low sodium levels Resolved, continue to follow Followup: Return in about 6 months (around 01/29/2023).  Oliver Barre, MD 08/03/2022 9:27 PM [facility]  Internal Medicine

## 2022-07-31 NOTE — Assessment & Plan Note (Signed)
Last vitamin D Lab Results  Component Value Date   VD25OH 39.44 01/28/2022   Low, reminded to start oral replacement

## 2022-07-31 NOTE — Patient Instructions (Addendum)
Ok to increase the aricept (donepezil) to 10 mg per day  Please take all new medication as prescribed - the generic for namenda 5 mg twice per day  Please continue all other medications as before, and refills have been done if requested.  Please have the pharmacy call with any other refills you may need.  Please continue your efforts at being more active, low cholesterol diet, and weight control.  Please keep your appointments with your specialists as you may have planned  Please go to the LAB at the blood drawing area for the tests to be done  You will be contacted by phone if any changes need to be made immediately.  Otherwise, you will receive a letter about your results with an explanation, but please check with MyChart first.  Please remember to sign up for MyChart if you have not done so, as this will be important to you in the future with finding out test results, communicating by private email, and scheduling acute appointments online when needed.  Please make an Appointment to return in 6 months, or sooner if needed

## 2022-08-03 ENCOUNTER — Encounter: Payer: Self-pay | Admitting: Internal Medicine

## 2022-08-03 DIAGNOSIS — E871 Hypo-osmolality and hyponatremia: Secondary | ICD-10-CM | POA: Insufficient documentation

## 2022-08-03 NOTE — Assessment & Plan Note (Addendum)
With worsening loss now at least moderate - for increased aricept 10 mg qd, and namenda 5 bid, declines neuro referral

## 2022-08-03 NOTE — Assessment & Plan Note (Signed)
Lab Results  Component Value Date   HGBA1C 5.8 07/31/2022   Stable, pt to continue current medical treatment  - diet, wt control, excercise  

## 2022-08-03 NOTE — Assessment & Plan Note (Signed)
Resolved, continue to follow 

## 2022-08-03 NOTE — Assessment & Plan Note (Signed)
BP Readings from Last 3 Encounters:  07/31/22 122/68  07/06/22 112/62  06/23/22 130/70   Stable, pt to continue medical treatment losartan 100 mg qd

## 2022-08-03 NOTE — Assessment & Plan Note (Signed)
Lab Results  Component Value Date   LDLCALC 65 07/31/2022   Stable, pt to continue current statin lipitor 80 mg qd

## 2022-08-06 DIAGNOSIS — R0683 Snoring: Secondary | ICD-10-CM | POA: Diagnosis not present

## 2022-08-06 DIAGNOSIS — G4733 Obstructive sleep apnea (adult) (pediatric): Secondary | ICD-10-CM | POA: Diagnosis not present

## 2022-08-06 DIAGNOSIS — G4700 Insomnia, unspecified: Secondary | ICD-10-CM | POA: Diagnosis not present

## 2022-08-21 IMAGING — DX DG CHEST 2V
2 series · 2 of 2 positions shown · non-contrast
Comparison: 01/23/2020

CLINICAL DATA: COPD

EXAM:
CHEST - 2 VIEW

[chest pa]
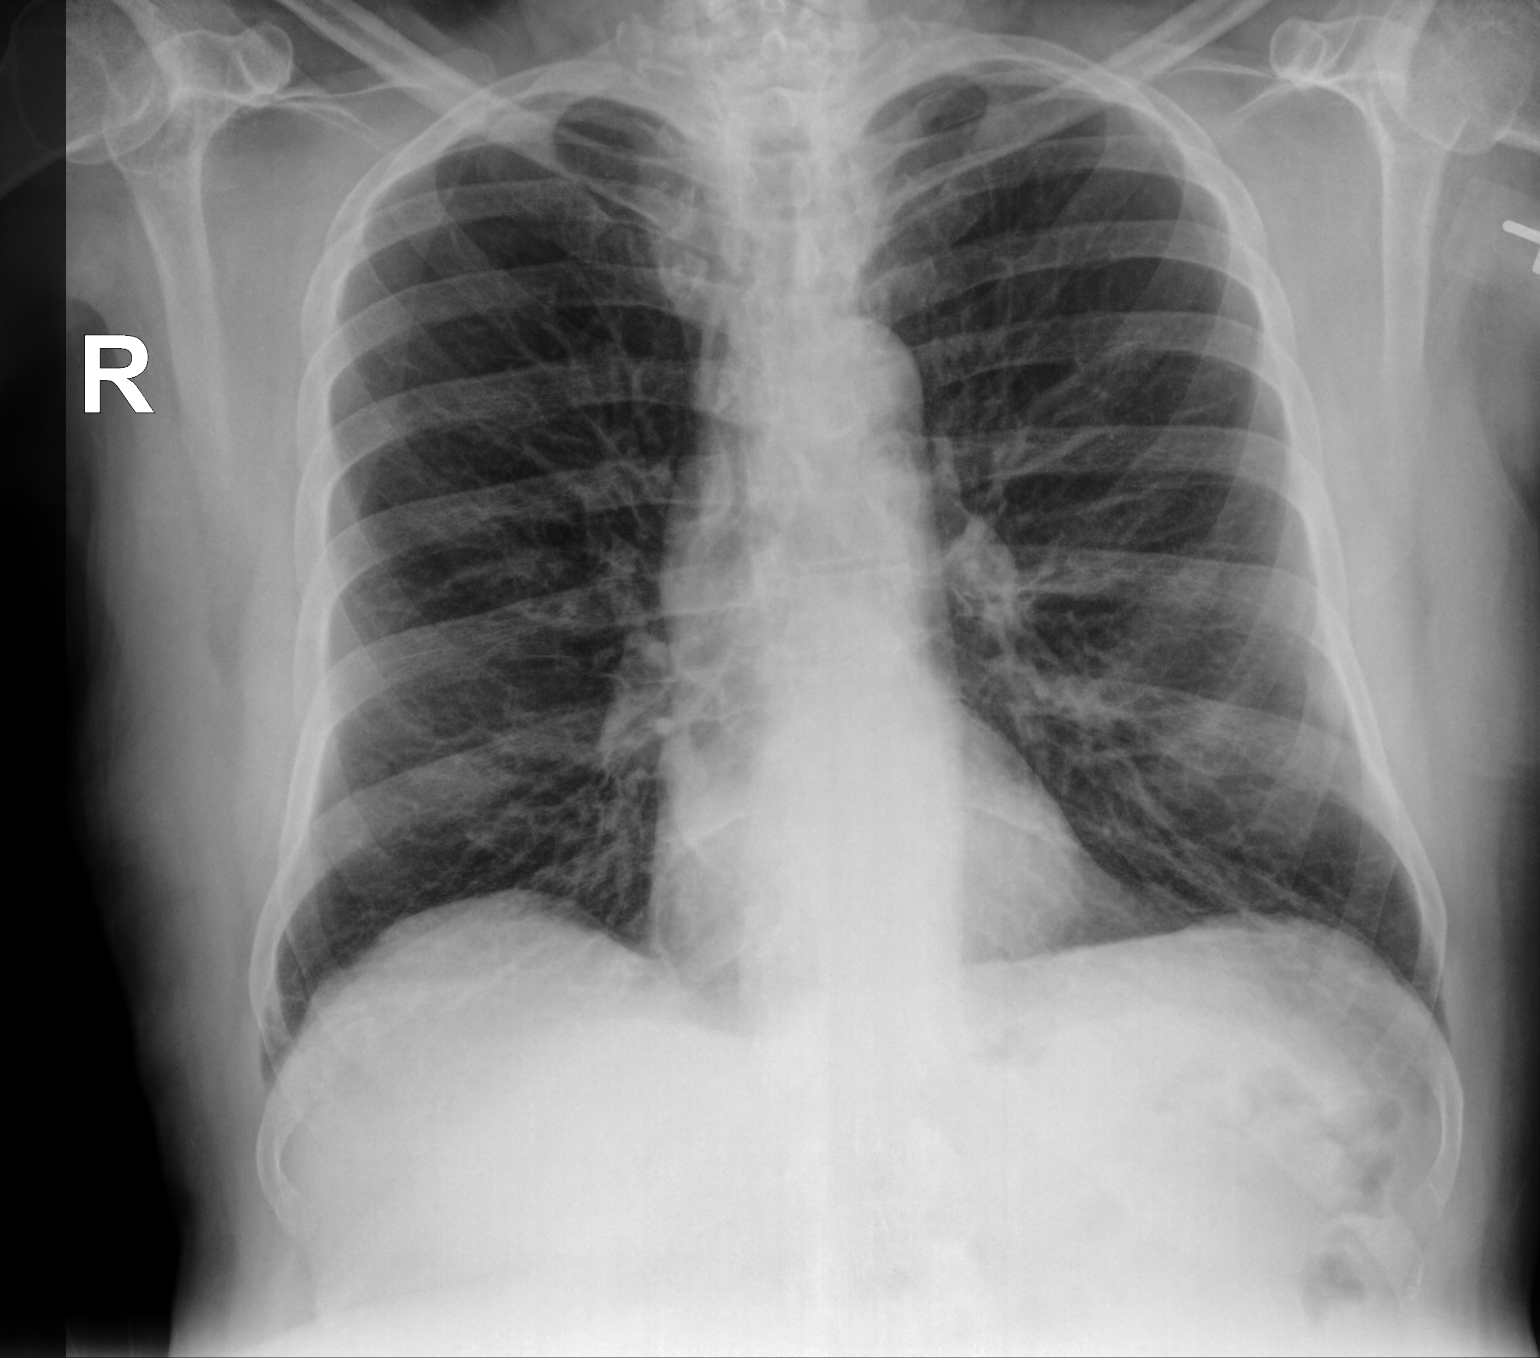

[chest lat]
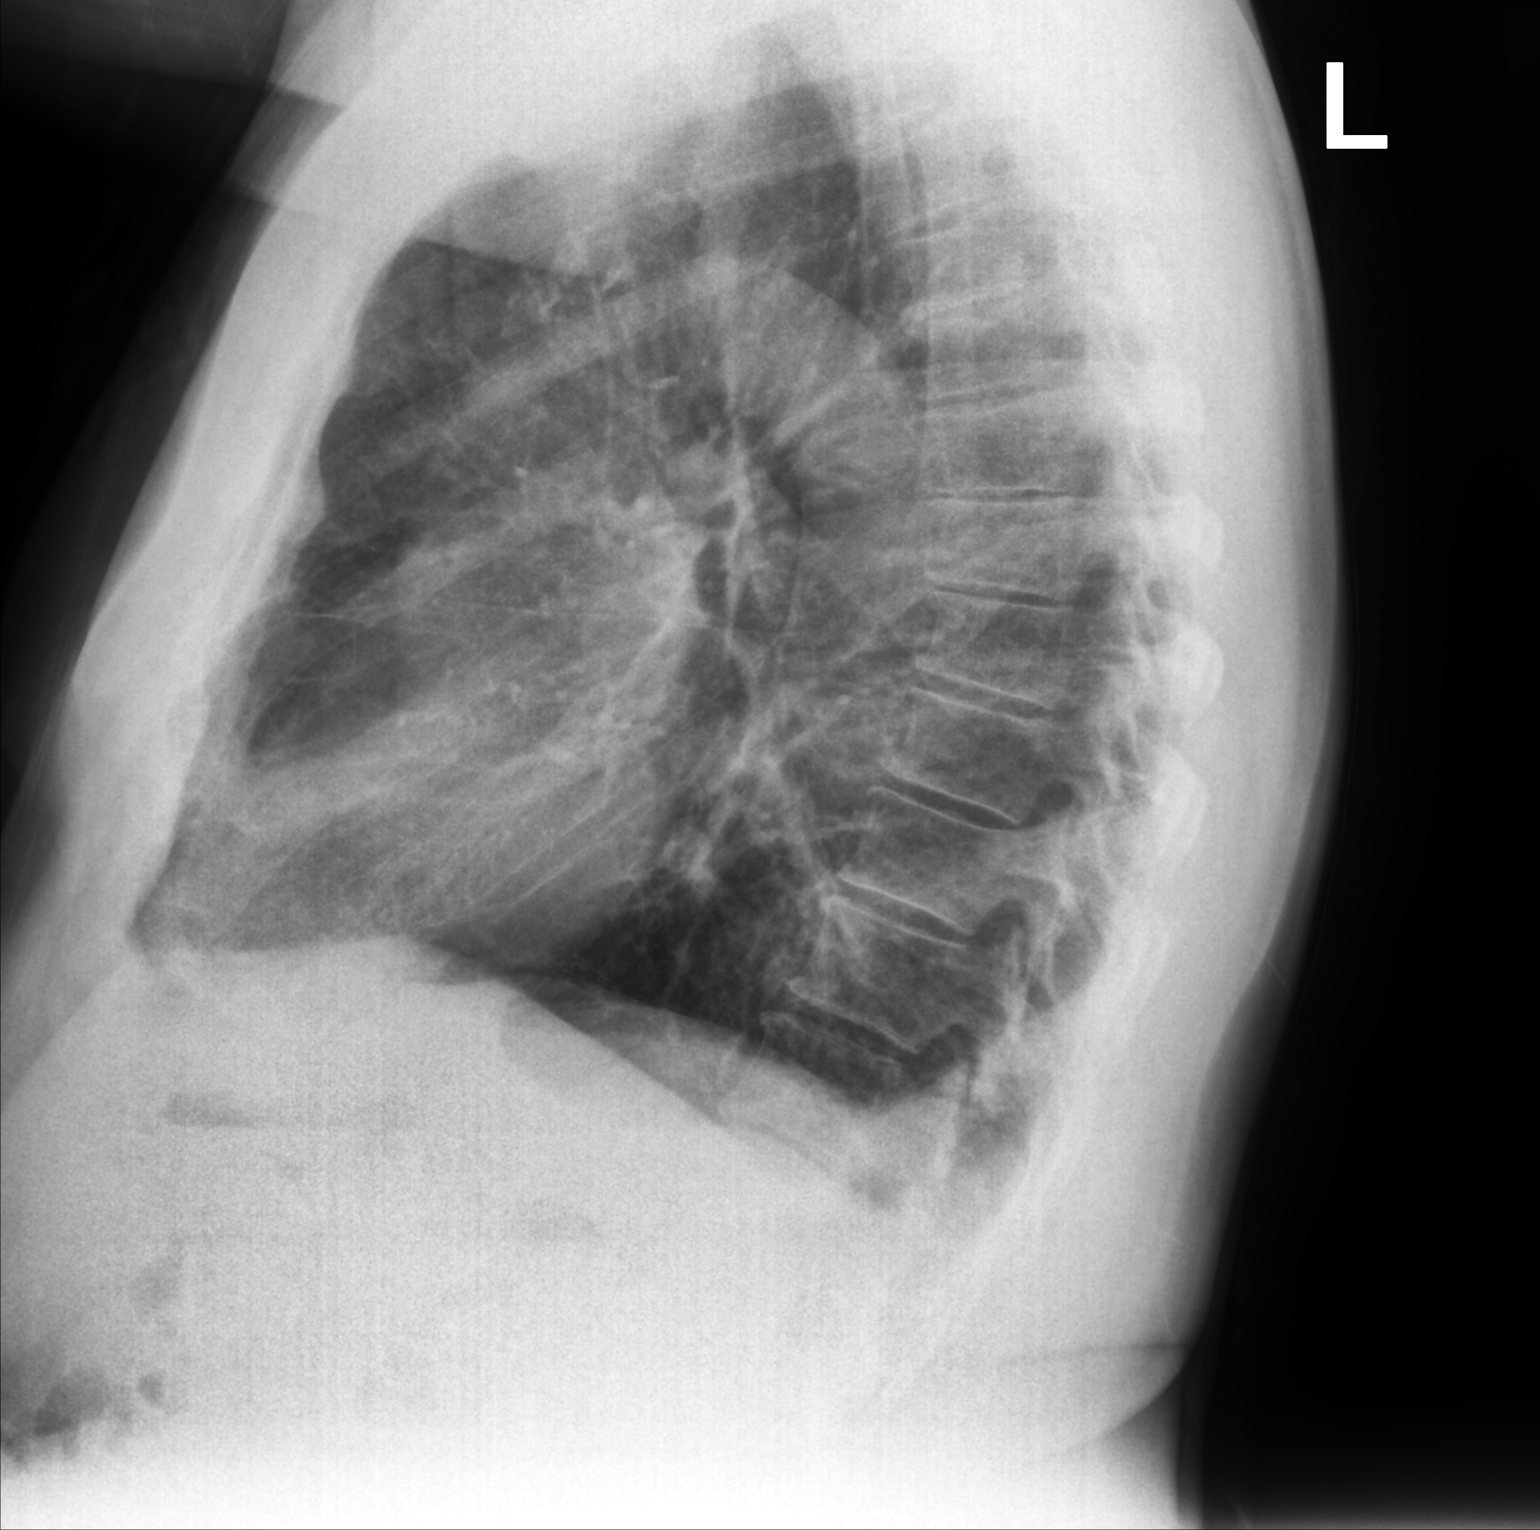

[2 of 2 positions shown; findings below may reference images not displayed]

FINDINGS: The heart size and mediastinal contours are within normal limits.
Mildly hyperexpanded lungs, similar to prior. No focal airspace
consolidation, pleural effusion, or pneumothorax. The visualized
skeletal structures are unremarkable.
IMPRESSION: 1. No active cardiopulmonary disease.
2. COPD.

## 2022-09-05 DIAGNOSIS — G4700 Insomnia, unspecified: Secondary | ICD-10-CM | POA: Diagnosis not present

## 2022-09-05 DIAGNOSIS — R0683 Snoring: Secondary | ICD-10-CM | POA: Diagnosis not present

## 2022-09-05 DIAGNOSIS — G4733 Obstructive sleep apnea (adult) (pediatric): Secondary | ICD-10-CM | POA: Diagnosis not present

## 2022-09-16 DIAGNOSIS — G4700 Insomnia, unspecified: Secondary | ICD-10-CM | POA: Diagnosis not present

## 2022-09-16 DIAGNOSIS — R0683 Snoring: Secondary | ICD-10-CM | POA: Diagnosis not present

## 2022-09-16 DIAGNOSIS — G4733 Obstructive sleep apnea (adult) (pediatric): Secondary | ICD-10-CM | POA: Diagnosis not present

## 2022-09-29 ENCOUNTER — Telehealth: Payer: Self-pay | Admitting: Internal Medicine

## 2022-09-29 NOTE — Telephone Encounter (Signed)
Left message for patient to call back to schedule Medicare Annual Wellness Visit   Last AWV  10/07/21  Please schedule at anytime with [facility] Health Advisor if patient calls the office back.      Any questions, please call me at [phone] 

## 2022-10-06 DIAGNOSIS — G4700 Insomnia, unspecified: Secondary | ICD-10-CM | POA: Diagnosis not present

## 2022-10-06 DIAGNOSIS — G4733 Obstructive sleep apnea (adult) (pediatric): Secondary | ICD-10-CM | POA: Diagnosis not present

## 2022-10-06 DIAGNOSIS — R0683 Snoring: Secondary | ICD-10-CM | POA: Diagnosis not present

## 2022-10-21 DIAGNOSIS — G4733 Obstructive sleep apnea (adult) (pediatric): Secondary | ICD-10-CM | POA: Diagnosis not present

## 2022-10-21 DIAGNOSIS — G4700 Insomnia, unspecified: Secondary | ICD-10-CM | POA: Diagnosis not present

## 2022-10-23 DIAGNOSIS — G4733 Obstructive sleep apnea (adult) (pediatric): Secondary | ICD-10-CM | POA: Diagnosis not present

## 2022-10-23 DIAGNOSIS — G4700 Insomnia, unspecified: Secondary | ICD-10-CM | POA: Diagnosis not present

## 2022-10-23 DIAGNOSIS — R0683 Snoring: Secondary | ICD-10-CM | POA: Diagnosis not present

## 2022-11-05 DIAGNOSIS — G4700 Insomnia, unspecified: Secondary | ICD-10-CM | POA: Diagnosis not present

## 2022-11-05 DIAGNOSIS — R0683 Snoring: Secondary | ICD-10-CM | POA: Diagnosis not present

## 2022-11-05 DIAGNOSIS — G4733 Obstructive sleep apnea (adult) (pediatric): Secondary | ICD-10-CM | POA: Diagnosis not present

## 2022-11-23 DIAGNOSIS — R0683 Snoring: Secondary | ICD-10-CM | POA: Diagnosis not present

## 2022-11-23 DIAGNOSIS — G4733 Obstructive sleep apnea (adult) (pediatric): Secondary | ICD-10-CM | POA: Diagnosis not present

## 2022-11-23 DIAGNOSIS — G4700 Insomnia, unspecified: Secondary | ICD-10-CM | POA: Diagnosis not present

## 2022-11-24 ENCOUNTER — Ambulatory Visit (INDEPENDENT_AMBULATORY_CARE_PROVIDER_SITE_OTHER): Payer: PPO | Admitting: Internal Medicine

## 2022-11-24 ENCOUNTER — Encounter: Payer: Self-pay | Admitting: Internal Medicine

## 2022-11-24 VITALS — BP 126/68 | HR 82 | Temp 99.4°F | Ht 70.0 in | Wt 167.0 lb

## 2022-11-24 DIAGNOSIS — I10 Essential (primary) hypertension: Secondary | ICD-10-CM

## 2022-11-24 DIAGNOSIS — E538 Deficiency of other specified B group vitamins: Secondary | ICD-10-CM | POA: Diagnosis not present

## 2022-11-24 DIAGNOSIS — Z0001 Encounter for general adult medical examination with abnormal findings: Secondary | ICD-10-CM | POA: Diagnosis not present

## 2022-11-24 DIAGNOSIS — N481 Balanitis: Secondary | ICD-10-CM | POA: Insufficient documentation

## 2022-11-24 DIAGNOSIS — N529 Male erectile dysfunction, unspecified: Secondary | ICD-10-CM

## 2022-11-24 DIAGNOSIS — E7800 Pure hypercholesterolemia, unspecified: Secondary | ICD-10-CM

## 2022-11-24 DIAGNOSIS — E559 Vitamin D deficiency, unspecified: Secondary | ICD-10-CM

## 2022-11-24 DIAGNOSIS — R7302 Impaired glucose tolerance (oral): Secondary | ICD-10-CM

## 2022-11-24 LAB — CBC WITH DIFFERENTIAL/PLATELET
Basophils Absolute: 0.1 10*3/uL (ref 0.0–0.1)
Basophils Relative: 0.8 % (ref 0.0–3.0)
Eosinophils Absolute: 0.3 10*3/uL (ref 0.0–0.7)
Eosinophils Relative: 3.4 % (ref 0.0–5.0)
HCT: 41.8 % (ref 39.0–52.0)
Hemoglobin: 14.1 g/dL (ref 13.0–17.0)
Lymphocytes Relative: 26.4 % (ref 12.0–46.0)
Lymphs Abs: 2.2 10*3/uL (ref 0.7–4.0)
MCHC: 33.7 g/dL (ref 30.0–36.0)
MCV: 96.6 fl (ref 78.0–100.0)
Monocytes Absolute: 0.7 10*3/uL (ref 0.1–1.0)
Monocytes Relative: 8.9 % (ref 3.0–12.0)
Neutro Abs: 4.9 10*3/uL (ref 1.4–7.7)
Neutrophils Relative %: 60.5 % (ref 43.0–77.0)
Platelets: 306 10*3/uL (ref 150.0–400.0)
RBC: 4.33 Mil/uL (ref 4.22–5.81)
RDW: 13.8 % (ref 11.5–15.5)
WBC: 8.2 10*3/uL (ref 4.0–10.5)

## 2022-11-24 LAB — HEPATIC FUNCTION PANEL
ALT: 32 U/L (ref 0–53)
AST: 22 U/L (ref 0–37)
Albumin: 4.4 g/dL (ref 3.5–5.2)
Alkaline Phosphatase: 87 U/L (ref 39–117)
Bilirubin, Direct: 0.1 mg/dL (ref 0.0–0.3)
Total Bilirubin: 0.4 mg/dL (ref 0.2–1.2)
Total Protein: 7.5 g/dL (ref 6.0–8.3)

## 2022-11-24 LAB — LIPID PANEL
Cholesterol: 124 mg/dL (ref 0–200)
HDL: 41.7 mg/dL (ref >39.00)
LDL Cholesterol: 44 mg/dL (ref 0–99)
NonHDL: 82.01
Total CHOL/HDL Ratio: 3
Triglycerides: 192 mg/dL — ABNORMAL HIGH (ref 0.0–149.0)
VLDL: 38.4 mg/dL (ref 0.0–40.0)

## 2022-11-24 LAB — HEMOGLOBIN A1C: Hgb A1c MFr Bld: 5.9 % (ref 4.6–6.5)

## 2022-11-24 LAB — VITAMIN D 25 HYDROXY (VIT D DEFICIENCY, FRACTURES): VITD: 46.31 ng/mL (ref 30.00–100.00)

## 2022-11-24 LAB — BASIC METABOLIC PANEL
BUN: 19 mg/dL (ref 6–23)
CO2: 28 mEq/L (ref 19–32)
Calcium: 9.5 mg/dL (ref 8.4–10.5)
Chloride: 102 mEq/L (ref 96–112)
Creatinine, Ser: 1.29 mg/dL (ref 0.40–1.50)
GFR: 53.76 mL/min — ABNORMAL LOW (ref >60.00)
Glucose, Bld: 96 mg/dL (ref 70–99)
Potassium: 3.8 mEq/L (ref 3.5–5.1)
Sodium: 138 mEq/L (ref 135–145)

## 2022-11-24 LAB — TSH: TSH: 1.07 u[IU]/mL (ref 0.35–5.50)

## 2022-11-24 LAB — VITAMIN B12: Vitamin B-12: 1097 pg/mL — ABNORMAL HIGH (ref 211–911)

## 2022-11-24 MED ORDER — SILDENAFIL CITRATE 100 MG PO TABS: 50.0000 mg | ORAL_TABLET | Freq: Every day | ORAL | 11 refills | Status: DC | PRN

## 2022-11-24 MED ORDER — KETOCONAZOLE 2 % EX CREA: 1.0000 | TOPICAL_CREAM | Freq: Every day | CUTANEOUS | 1 refills | Status: DC

## 2022-11-24 NOTE — Assessment & Plan Note (Signed)
Mild to mod, for antibx course - ketoconozole cr prn,  to f/u any worsening symptoms or concerns

## 2022-11-24 NOTE — Progress Notes (Signed)
Patient ID: Ryan Diaz, male   DOB: [birth year], 77 y.o.   MRN: [id number]         Chief Complaint:: wellness exam and Possible bladder infection   With tender area at the glans penis, ED, htn, hld       HPI:  Ryan Diaz is a 77 y.o. male here for wellness exam; up to date, no longer smoking.                        Also Pt denies chest pain, increased sob or doe, wheezing, orthopnea, PND, increased LE swelling, palpitations, dizziness or syncope.   Pt denies polydipsia, polyuria, or new focal neuro s/s.   Denies urinary symptoms such as dysuria, frequency, urgency, flank pain, hematuria or n/v, fever, chills. But has a tender area right glands penis near meatus without overlying skin change or trauma.  Also with 6 mo worsening ED symptoms, asks if viagra now more affordable.     Wt Readings from Last 3 Encounters:  11/24/22 167 lb (75.8 kg)  07/31/22 167 lb (75.8 kg)  07/06/22 163 lb 9.6 oz (74.2 kg)   BP Readings from Last 3 Encounters:  11/24/22 126/68  07/31/22 122/68  07/06/22 112/62   Immunization History  Administered Date(s) Administered   COVID-19, mRNA, vaccine(Comirnaty)12 years and older 09/01/2022   Fluad Quad(high Dose 65+) 08/04/2019, 07/26/2020, 07/31/2021, 07/31/2022   H1N1 09/16/2008   Influenza Split 08/27/2013, 07/28/2017, 09/16/2018   Influenza Whole 08/17/2005, 09/16/2009   Influenza, High Dose Seasonal PF 08/18/2017   Influenza, Seasonal, Injecte, Preservative Fre 08/12/2010, 09/07/2011, 09/15/2012, 08/29/2013   Influenza,inj,Quad PF,6+ Mos 07/27/2016   Influenza,trivalent, recombinat, inj, PF 08/27/2018   Influenza-Unspecified 07/17/2006, 07/18/2007, 08/16/2008, 09/28/2008, 09/18/2009, 10/04/2014, 10/17/2015, 09/16/2016, 08/23/2017, 08/16/2018, 09/16/2018, 08/04/2019, 07/26/2020, 07/31/2021, 07/17/2022   PFIZER Comirnaty(Gray Top)Covid-19 Tri-Sucrose Vaccine 03/13/2021   PFIZER(Purple Top)SARS-COV-2 Vaccination 12/09/2019, 12/30/2019, 08/13/2020   Pfizer  Covid-19 Vaccine Bivalent Booster 63yrs & up 09/10/2021   Pneumococcal Conjugate-13 04/10/2014, 06/27/2014   Pneumococcal Polysaccharide-23 08/19/2006, 05/11/2007, 09/15/2012, 03/09/2018   Polio, Unspecified 03/03/1954, 09/01/1954, 06/30/1958   Rsv, Bivalent, Protein Subunit Rsvpref,pf Verdis Frederickson) 09/01/2022   Smallpox 08/05/1964, 04/26/1972   Td 01/01/2017   Tdap 05/11/2007   Tetanus 08/05/1964   Zoster Recombinat (Shingrix) 01/04/2018, 04/04/2018   Zoster, Live 08/19/2006, 01/01/2012   Health Maintenance Due  Topic Date Due   Lung Cancer Screening  Never done   Medicare Annual Wellness (AWV)  10/07/2022      Past Medical History:  Diagnosis Date   ABUSE, ALCOHOL, CONTINUOUS 08/08/2007   ALLERGIC RHINITIS 08/08/2007   ANXIETY 08/08/2007   Carpal tunnel syndrome    COPD 10/03/2009   DEPRESSION 08/08/2007   ERECTILE DYSFUNCTION 08/08/2007   GENITAL HERPES, HX OF 08/08/2007   GERD 08/08/2007   GLUCOSE INTOLERANCE 08/08/2007   HYPERLIPIDEMIA 08/08/2007   HYPERTENSION 08/08/2007   Impaired glucose tolerance 09/26/2011   Insomnia 05/18/2012   PAIN IN SOFT TISSUES OF LIMB 10/02/2009   Sleep apnea    OSA-uses CPAP nightly   Past Surgical History:  Procedure Laterality Date   ACHILLES TENDON REPAIR Bilateral    CARPAL TUNNEL RELEASE Right 08/18/2018   Procedure: RIGHT CARPAL TUNNEL RELEASE;  Surgeon: Betha Loa, MD;  Location: [facility];  Service: Orthopedics;  Laterality: Right;   CARPAL TUNNEL RELEASE Left 11/21/2018   Procedure: LEFT CARPAL TUNNEL RELEASE;  Surgeon: Betha Loa, MD;  Location: [facility];  Service: Orthopedics;  Laterality: Left;  Bier block   COLONOSCOPY     LEFT HEART CATH AND CORONARY ANGIOGRAPHY N/A 10/17/2020   Procedure: LEFT HEART CATH AND CORONARY ANGIOGRAPHY;  Surgeon: Yvonne Kendall, MD;  Location: [facility];  Service: Cardiovascular;  Laterality: N/A;    reports that he quit smoking about 13 years ago. His smoking use  included cigarettes. He has a 52.50 pack-year smoking history. He has never used smokeless tobacco. He reports current alcohol use of about 46.0 - 53.0 standard drinks of alcohol per week. He reports current drug use. Drug: Marijuana. family history includes Colon polyps in his brother. Allergies  Allergen Reactions   Memantine Other (See Comments)   Sildenafil Palpitations   Current Outpatient Medications on File Prior to Visit  Medication Sig Dispense Refill   albuterol (ACCUNEB) 0.63 MG/3ML nebulizer solution Take 3 mLs (0.63 mg total) by nebulization every 6 (six) hours as needed for wheezing. 75 mL 12   albuterol (PROVENTIL HFA;VENTOLIN HFA) 108 (90 BASE) MCG/ACT inhaler Inhale 2 puffs into the lungs every 6 (six) hours as needed for wheezing or shortness of breath.     aspirin EC 81 MG tablet Take 81 mg by mouth daily.     atorvastatin (LIPITOR) 80 MG tablet Take 1 tablet (80 mg total) by mouth daily. 90 tablet 1   Budeson-Glycopyrrol-Formoterol (BREZTRI AEROSPHERE) 160-9-4.8 MCG/ACT AERO Inhale 2 puffs into the lungs in the morning and at bedtime. 10.7 g 6   cetirizine (ZYRTEC) 10 MG tablet Take 1 tablet (10 mg total) by mouth daily. 90 tablet 3   chlorhexidine (PERIDEX) 0.12 % solution      Cholecalciferol (VITAMIN D) 50 MCG (2000 UT) tablet Take 1 tablet (2,000 Units total) by mouth daily. 90 tablet 3   clotrimazole (LOTRIMIN) 1 % cream Apply 1 application topically 2 (two) times daily as needed (irritation).     cyanocobalamin (VITAMIN B12) 1000 MCG tablet Take 1 tablet (1,000 mcg total) by mouth daily. 90 tablet 3   docusate sodium (COLACE) 100 MG capsule Take 100 mg by mouth daily as needed for mild constipation.     donepezil (ARICEPT) 10 MG tablet Take 1 tablet (10 mg total) by mouth at bedtime. 90 tablet 3   Ferrous Sulfate 27 MG TABS Take 27 mg by mouth daily as needed (pt prefrence).     fluocinonide ointment (LIDEX) 0.05 % Apply 1 application topically 2 (two) times daily. 30  g 2   fluticasone (CUTIVATE) 0.05 % cream Apply 1 application topically daily as needed (irritation).     gabapentin (NEURONTIN) 100 MG capsule Take 1 capsule (100 mg total) by mouth 3 (three) times daily. (Patient taking differently: Take 100 mg by mouth 3 (three) times daily as needed (pain).) 90 capsule 5   ipratropium (ATROVENT) 0.06 % nasal spray Place 2 sprays into both nostrils 3 (three) times daily. 15 mL 5   ipratropium-albuterol (DUONEB) 0.5-2.5 (3) MG/3ML SOLN Take 3 mLs by nebulization every 6 (six) hours as needed. 360 mL 12   losartan (COZAAR) 100 MG tablet Take 1 tablet (100 mg total) by mouth daily. 90 tablet 3   meloxicam (MOBIC) 15 MG tablet 1 tab by mouth once daily as needed (Patient taking differently: Take 15 mg by mouth daily as needed for pain. 1 tab by mouth once daily as needed) 90 tablet 1   memantine (NAMENDA) 10 MG tablet Take 1 tablet (10 mg total) by mouth 2 (two) times daily. 180 tablet 3   nitroGLYCERIN (NITROSTAT)  0.4 MG SL tablet Place 1 tablet (0.4 mg total) under the tongue every 5 (five) minutes as needed for chest pain. 25 tablet 4   Omega-3 Fatty Acids (FISH OIL) 1000 MG CAPS Take 1,000 mg by mouth 2 (two) times daily.     omeprazole (PRILOSEC) 20 MG capsule Take 1 capsule (20 mg total) by mouth 2 (two) times daily. (Patient taking differently: Take 20 mg by mouth 2 (two) times daily as needed (acid reflux).) 90 capsule 3   pantoprazole (PROTONIX) 40 MG tablet TAKE ONE TABLET BY MOUTH ONCE EVERY DAY TO CONTROL STOMACH ACID     Spacer/Aero-Holding Chambers (AEROCHAMBER MV) inhaler Use as instructed 1 each 0   tadalafil (CIALIS) 20 MG tablet Take 20 mg by mouth daily as needed for erectile dysfunction.     terazosin (HYTRIN) 2 MG capsule Take 1 capsule (2 mg total) by mouth at bedtime. 90 capsule 3   traZODone (DESYREL) 50 MG tablet Take 50 mg by mouth at bedtime as needed for sleep.     No current facility-administered medications on file prior to visit.         ROS:  All others reviewed and negative.  Objective        PE:  BP 126/68 (BP Location: Left Arm, Patient Position: Sitting, Cuff Size: Large)   Pulse 82   Temp 99.4 F (37.4 C) (Oral)   Ht 5\' 10"  (1.778 m)   Wt 167 lb (75.8 kg)   SpO2 93%   BMI 23.96 kg/m                 Constitutional: Pt appears in NAD               HENT: Head: NCAT.                Right Ear: External ear normal.                 Left Ear: External ear normal.                Eyes: . Pupils are equal, round, and reactive to light. Conjunctivae and EOM are normal               Nose: without d/c or deformity               Neck: Neck supple. Gross normal ROM               Cardiovascular: Normal rate and regular rhythm.                 Pulmonary/Chest: Effort normal and breath sounds without rales or wheezing.                Abd:  Soft, NT, ND, + BS, no organomegaly               Neurological: Pt is alert. At baseline orientation, motor grossly intact               Skin: Skin is warm. No rashes, no other new lesions, LE edema - none, glans penis without ulcer or swelling, but does have a very small area mild tender near meatus               Psychiatric: Pt behavior is normal without agitation   Micro: none  Cardiac tracings I have personally interpreted today:  none  Pertinent Radiological findings (summarize): none   Lab Results  Component Value Date  WBC 7.1 07/31/2022   HGB 14.2 07/31/2022   HCT 42.3 07/31/2022   PLT 249.0 07/31/2022   GLUCOSE 74 07/31/2022   CHOL 143 07/31/2022   TRIG 171.0 (H) 07/31/2022   HDL 43.60 07/31/2022   LDLDIRECT 96.0 05/13/2021   LDLCALC 65 07/31/2022   ALT 25 07/31/2022   AST 20 07/31/2022   NA 139 07/31/2022   K 4.0 07/31/2022   CL 104 07/31/2022   CREATININE 1.56 (H) 07/31/2022   BUN 22 07/31/2022   CO2 29 07/31/2022   TSH 0.89 07/31/2022   PSA 1.57 01/28/2022   HGBA1C 5.8 07/31/2022   Assessment/Plan:  Ryan Diaz is a 77 y.o. Black or African American  [2] male with  has a past medical history of ABUSE, ALCOHOL, CONTINUOUS (08/08/2007), ALLERGIC RHINITIS (08/08/2007), ANXIETY (08/08/2007), Carpal tunnel syndrome, COPD (10/03/2009), DEPRESSION (08/08/2007), ERECTILE DYSFUNCTION (08/08/2007), GENITAL HERPES, HX OF (08/08/2007), GERD (08/08/2007), GLUCOSE INTOLERANCE (08/08/2007), HYPERLIPIDEMIA (08/08/2007), HYPERTENSION (08/08/2007), Impaired glucose tolerance (09/26/2011), Insomnia (05/18/2012), PAIN IN SOFT TISSUES OF LIMB (10/02/2009), and Sleep apnea.  Encounter for well adult exam with abnormal findings Age and sex appropriate education and counseling updated with regular exercise and diet Referrals for preventative services - none needed Immunizations addressed - none needed Smoking counseling  - none needed Evidence for depression or other mood disorder - none significant Most recent labs reviewed. I have personally reviewed and have noted: 1) the patient's medical and social history 2) The patient's current medications and supplements 3) The patient's height, weight, and BMI have been recorded in the chart   Essential hypertension BP Readings from Last 3 Encounters:  11/24/22 126/68  07/31/22 122/68  07/06/22 112/62   Stable, pt to continue medical treatment losratan 100 mg qd   HLD (hyperlipidemia) Lab Results  Component Value Date   LDLCALC 65 07/31/2022   Stable, pt to continue current statin liptior 80 mg, and f/u lipids today   Impaired glucose tolerance Lab Results  Component Value Date   HGBA1C 5.8 07/31/2022   Stable, pt to continue current medical treatment  - diet, wt control   Vitamin D deficiency Last vitamin D Lab Results  Component Value Date   VD25OH 39.44 01/28/2022   Low, to start ral replacement   Erectile dysfunction For viagra prn,  to f/u any worsening symptoms or concerns  Balanitis Mild to mod, for antibx course - ketoconozole cr prn,  to f/u any worsening symptoms or concerns  Followup:  Return in about 6 months (around 05/25/2023).  Oliver Barre, MD 11/24/2022 8:21 PM  [facility] - [facility] Internal Medicine

## 2022-11-24 NOTE — Assessment & Plan Note (Signed)

## 2022-11-24 NOTE — Assessment & Plan Note (Signed)
For viagra prn,  to f/u any worsening symptoms or concerns 

## 2022-11-24 NOTE — Assessment & Plan Note (Signed)
Last vitamin D Lab Results  Component Value Date   VD25OH 39.44 01/28/2022   Low, to start ral replacement

## 2022-11-24 NOTE — Assessment & Plan Note (Signed)
BP Readings from Last 3 Encounters:  11/24/22 126/68  07/31/22 122/68  07/06/22 112/62   Stable, pt to continue medical treatment losratan 100 mg qd

## 2022-11-24 NOTE — Patient Instructions (Signed)
Please take all new medication as prescribed - the cream, and the viagra as needed  Please continue all other medications as before, and refills have been done if requested.  Please have the pharmacy call with any other refills you may need.  Please continue your efforts at being more active, low cholesterol diet, and weight control.  You are otherwise up to date with prevention measures today.  Please keep your appointments with your specialists as you may have planned  Please go to the LAB at the blood drawing area for the tests to be done  You will be contacted by phone if any changes need to be made immediately.  Otherwise, you will receive a letter about your results with an explanation, but please check with MyChart first.  Please remember to sign up for MyChart if you have not done so, as this will be important to you in the future with finding out test results, communicating by private email, and scheduling acute appointments online when needed.  Please make an Appointment to return in 6 months, or sooner if needed

## 2022-11-24 NOTE — Assessment & Plan Note (Signed)
Lab Results  Component Value Date   HGBA1C 5.8 07/31/2022   Stable, pt to continue current medical treatment  - diet, wt control

## 2022-11-24 NOTE — Assessment & Plan Note (Signed)
Lab Results  Component Value Date   LDLCALC 65 07/31/2022   Stable, pt to continue current statin liptior 80 mg, and f/u lipids today

## 2022-11-25 LAB — URINALYSIS, ROUTINE W REFLEX MICROSCOPIC
Bilirubin Urine: NEGATIVE
Hgb urine dipstick: NEGATIVE
Ketones, ur: NEGATIVE
Leukocytes,Ua: NEGATIVE
Nitrite: NEGATIVE
RBC / HPF: NONE SEEN (ref 0–?)
Specific Gravity, Urine: 1.02 (ref 1.000–1.030)
Total Protein, Urine: NEGATIVE
Urine Glucose: NEGATIVE
Urobilinogen, UA: 0.2 (ref 0.0–1.0)
WBC, UA: NONE SEEN (ref 0–?)
pH: 6.5 (ref 5.0–8.0)

## 2022-12-06 DIAGNOSIS — G4700 Insomnia, unspecified: Secondary | ICD-10-CM | POA: Diagnosis not present

## 2022-12-06 DIAGNOSIS — R0683 Snoring: Secondary | ICD-10-CM | POA: Diagnosis not present

## 2022-12-06 DIAGNOSIS — G4733 Obstructive sleep apnea (adult) (pediatric): Secondary | ICD-10-CM | POA: Diagnosis not present

## 2022-12-07 ENCOUNTER — Telehealth: Payer: Self-pay | Admitting: Internal Medicine

## 2022-12-07 DIAGNOSIS — R21 Rash and other nonspecific skin eruption: Secondary | ICD-10-CM

## 2022-12-07 NOTE — Telephone Encounter (Signed)
Pt states that the ketoconazole (NIZORAL) 2 % cream has not been working and there has been very little improvement. Pt is requesting a different medication be sent to his pharmacy to handle his symptoms.   Caller & Relationship to patient: Self  Call back number: [phone]   Date of last office visit: 1.9.24  Date of next office visit: 3.15.24  Preferred Pharmacy:   [facility]    Phone: [phone]  Fax: [phone]

## 2022-12-08 MED ORDER — CLOTRIMAZOLE-BETAMETHASONE 1-0.05 % EX CREA: 1.0000 | TOPICAL_CREAM | Freq: Every day | CUTANEOUS | 0 refills | Status: AC

## 2022-12-08 NOTE — Telephone Encounter (Signed)
Patient states that the ketoconazole cream is not helping as he has not seen any improvement and is requesting something different that may help.

## 2022-12-08 NOTE — Telephone Encounter (Signed)
Ok for change to lotrisone cream - I also placed referral to derm if this does not work well

## 2022-12-10 NOTE — Telephone Encounter (Signed)
Left message for patient regarding med change and referral to derm

## 2022-12-14 ENCOUNTER — Telehealth: Payer: Self-pay | Admitting: Internal Medicine

## 2022-12-14 NOTE — Telephone Encounter (Signed)
Phone answers then hangs up  unable to leave a message for patient to call back and schedule Medicare Annual Wellness Visit (AWV) in office.   If not able to come in office, please offer to do virtually or by telephone.   Last AWV: 10/07/2021    Please schedule at any time with [facility].  30 minute appointment  Any questions, please contact me at [phone]   Thank you,   [place] [facility] [place] [facility] ??[phone] or ??[phone]

## 2022-12-16 DIAGNOSIS — H43812 Vitreous degeneration, left eye: Secondary | ICD-10-CM | POA: Diagnosis not present

## 2022-12-24 ENCOUNTER — Ambulatory Visit: Payer: PPO | Admitting: Internal Medicine

## 2022-12-24 DIAGNOSIS — R0683 Snoring: Secondary | ICD-10-CM | POA: Diagnosis not present

## 2022-12-24 DIAGNOSIS — G4700 Insomnia, unspecified: Secondary | ICD-10-CM | POA: Diagnosis not present

## 2022-12-24 DIAGNOSIS — G4733 Obstructive sleep apnea (adult) (pediatric): Secondary | ICD-10-CM | POA: Diagnosis not present

## 2023-01-05 NOTE — Progress Notes (Unsigned)
HPI male former smoker followed for OSA, insomnia, complicated by ETOH, COPD, GERD, HBP NPSG 10/31/13- AHI 52/hour, desaturation to 83%, body weight 165 pounds Unattended Home Sleep Test 09/02/2016- AHI 9.4/hour, desaturation to 87%, body weight 163 pounds Office Spirometry 01/2019-moderate obstructive airways disease.  FVC 3.6/92%, FEV1 2.0/67%, ratio 1.54, FEF 25-75% 1.9/45% PFT 10/16/20- moderately severe obstruction, some resp to BD, mod severe DLCO deficit, emphysema pattern FEV1/FVC 0.52 Walk test room air 01/22/21- lowest sat 91%, max HR 10 PFT 07/07/22- Moderately severe obstruction, insignif resp to BD, severe DLCO deficit -------------------------------------------------------------------------------------  07/06/22- 77 year old male former smoker followed for OSA, insomnia, complicated by history EtOH, COPD, GERD, HTN, CPAP auto 5-20/[employer]/ [employer]  AirSense 11 AutoSet   -Albuterol hfa,  Atrovent nasal,   Trazodone 50 Download compliance- 67%, AHI 7.3 Body weight today- 163 lbs (168 in Sept, 2022) -----Pt is having increased SOB also states he's had a loss of energy, appetite, and difficulty sleeping the last couple weeks. Concerned that his COPD is worsening, OSA seems to be the same CPAP working well. More concerned about perceived worsening of dyspnea on exertion at least over the last several months with no acute event and little cough or wheeze.  Again mentions sometimes with exertion chest feels tight and he gets a "stinging" feeling across anterior chest without radiation.  Previous echocardiogram okay.  He asked about pursed lip breathing which he learned about in pulmonary rehabilitation class and asked about an AeroChamber suggested by pharmacist.  He has not been using inhalers regularly and asks about alternative to Trelegy.  I suggested trial sample of Breztri and try using rescue inhaler shortly before exercise. Walk Test on Room 07/06/22- Max HR 97/ min, minimum O2 sat 92%. CXR  06/23/22- IMPRESSION: No active cardiopulmonary disease.  01/07/23-77 year old male former smoker followed for OSA, insomnia, complicated by history EtOH, COPD, GERD, HTN, -Albuterol hfa,  Neb albuterol/ Duoneb, Breztri,  Atrovent nasal,   Trazodone 50 CPAP auto 5-20/[employer]/ [employer]  AirSense 11 AutoSet   Download compliance- 77%, AHI 9/ hr     Centrals 3.1, Obst 4.7 Body weight today-   167 lbs    (168 in Sept, 2022) PFT 07/07/22- Moderately severe obstruction, insignif resp to BD, severe DLCO deficit -----Pt is doing okay CPAP occasionally irritates his nostrils and he takes it off.  Download reviewed.  We discussed trial of nasal saline gel.  Residual AHI is a little high but some of it is central apneas. Aware of dyspnea on exertion.  Rarely uses his albuterol inhaler.  We discussed this and appropriate indications.  Sometimes if he really pushes he gets "stinging" precordial discomfort that he thinks is related to his breathing.  I discussed possibility of angina with instruction to report if it gets worse, E TC.  He will discuss with PCP.   ROS-see HPI   + = positive Constitutional:    weight loss, night sweats, fevers, chills, +fatigue, lassitude. HEENT:    headaches, difficulty swallowing, tooth/dental problems, sore throat,       sneezing, itching, ear ache, nasal congestion, post nasal drip, snoring CV:    +chest pain, orthopnea, PND, swelling in lower extremities, anasarca,                                  izziness, palpitations Resp:   +shortness of breath with exertion or at rest.  productive cough,   non-productive cough, coughing up of blood.              change in color of mucus.  wheezing.   Skin:    rash or lesions. GI:  + heartburn, indigestion, abdominal pain, nausea, vomiting, diarrhea,                 change in bowel habits, loss of appetite GU: dysuria, change in color of urine, no urgency or frequency.   flank pain. MS:   joint pain, stiffness, decreased  range of motion, back pain. Neuro-     nothing unusual Psych:  change in mood or affect.  depression or anxiety.   memory loss.  OBJ- Physical Exam                   General- Alert, Oriented, Affect-appropriate, Distress- none acute, + slender Skin- rash-none, lesions- none, excoriation- none Lymphadenopathy- none Head- atraumatic            Eyes- Gross vision intact, PERRLA, conjunctivae and secretions clear            Ears- Hearing, canals-normal            Nose- Clear, no-Septal dev, mucus, polyps, erosion, perforation             Throat- Mallampati III-IV , mucosa clear , drainage- none, tonsils- atrophic Neck- flexible , trachea midline, no stridor , thyroid nl, carotid no bruit Chest - symmetrical excursion , unlabored           Heart/CV- RRR , no murmur , no gallop  , no rub, nl s1 s2                           - JVD- none , edema- none, stasis changes- none, varices- none           Lung- clear to P&A, wheeze- none, cough- none , dullness-none, rub- none           Chest wall-  Abd-  Br/ Gen/ Rectal- Not done, not indicated Extrem- cyanosis- none, clubbing, none, atrophy- none, strength- nl Neuro- grossly intact to observation

## 2023-01-06 DIAGNOSIS — H43812 Vitreous degeneration, left eye: Secondary | ICD-10-CM | POA: Diagnosis not present

## 2023-01-06 DIAGNOSIS — H2513 Age-related nuclear cataract, bilateral: Secondary | ICD-10-CM | POA: Diagnosis not present

## 2023-01-06 DIAGNOSIS — H04123 Dry eye syndrome of bilateral lacrimal glands: Secondary | ICD-10-CM | POA: Diagnosis not present

## 2023-01-07 ENCOUNTER — Encounter: Payer: Self-pay | Admitting: Internal Medicine

## 2023-01-07 ENCOUNTER — Ambulatory Visit (INDEPENDENT_AMBULATORY_CARE_PROVIDER_SITE_OTHER): Payer: PPO | Admitting: Internal Medicine

## 2023-01-07 VITALS — BP 112/60 | HR 87 | Ht 70.0 in | Wt 167.2 lb

## 2023-01-07 DIAGNOSIS — I200 Unstable angina: Secondary | ICD-10-CM

## 2023-01-07 DIAGNOSIS — J432 Centrilobular emphysema: Secondary | ICD-10-CM

## 2023-01-07 DIAGNOSIS — G4733 Obstructive sleep apnea (adult) (pediatric): Secondary | ICD-10-CM | POA: Diagnosis not present

## 2023-01-07 MED ORDER — ALBUTEROL SULFATE HFA 108 (90 BASE) MCG/ACT IN AERS: 2.0000 | INHALATION_SPRAY | Freq: Four times a day (QID) | RESPIRATORY_TRACT | 12 refills | Status: DC | PRN

## 2023-01-07 NOTE — Assessment & Plan Note (Signed)
Appropriate use of his rescue albuterol inhaler and we will refill it.  He has not been using it often enough to really judge whether he would benefit from a maintenance controller.  We will be watching that with him.

## 2023-01-07 NOTE — Assessment & Plan Note (Signed)
He benefits from CPAP and can continue auto 5-20.  There is a little central apnea but I do not think it is disruptive at this point.

## 2023-01-07 NOTE — Assessment & Plan Note (Signed)
He describes "stinging" substernal or precordial discomfort sometimes with more than usual exertion.  I discussed markers for angina.  He thinks this is lungs but I encouraged him to keep his other physicians aware of this.

## 2023-01-07 NOTE — Patient Instructions (Signed)
Albuterol "rescue" inhaler refill sent. Inhale 2 puffs eveery 6 hours as needed for shortness of breath, tight chest, cough.  Try otc  Mucinex to break up mucus in your chest. Helps to drink enough water also.  Try otc nasal saline gel (AYR, NeilMed or store brand) - a little in your nose for dryness and irritation as needed.  Continue CPAP auto 5-20

## 2023-01-19 DIAGNOSIS — G4733 Obstructive sleep apnea (adult) (pediatric): Secondary | ICD-10-CM | POA: Diagnosis not present

## 2023-01-19 DIAGNOSIS — G4700 Insomnia, unspecified: Secondary | ICD-10-CM | POA: Diagnosis not present

## 2023-01-22 DIAGNOSIS — G4733 Obstructive sleep apnea (adult) (pediatric): Secondary | ICD-10-CM | POA: Diagnosis not present

## 2023-01-22 DIAGNOSIS — R0683 Snoring: Secondary | ICD-10-CM | POA: Diagnosis not present

## 2023-01-22 DIAGNOSIS — G4700 Insomnia, unspecified: Secondary | ICD-10-CM | POA: Diagnosis not present

## 2023-01-26 DIAGNOSIS — L218 Other seborrheic dermatitis: Secondary | ICD-10-CM | POA: Diagnosis not present

## 2023-01-29 ENCOUNTER — Ambulatory Visit (INDEPENDENT_AMBULATORY_CARE_PROVIDER_SITE_OTHER): Payer: PPO | Admitting: Internal Medicine

## 2023-01-29 VITALS — BP 118/66 | HR 81 | Temp 98.1°F | Ht 70.0 in | Wt 167.0 lb

## 2023-01-29 DIAGNOSIS — R7302 Impaired glucose tolerance (oral): Secondary | ICD-10-CM

## 2023-01-29 DIAGNOSIS — N1831 Chronic kidney disease, stage 3a: Secondary | ICD-10-CM | POA: Diagnosis not present

## 2023-01-29 DIAGNOSIS — I10 Essential (primary) hypertension: Secondary | ICD-10-CM | POA: Diagnosis not present

## 2023-01-29 DIAGNOSIS — E7800 Pure hypercholesterolemia, unspecified: Secondary | ICD-10-CM

## 2023-01-29 DIAGNOSIS — E530 Riboflavin deficiency: Secondary | ICD-10-CM | POA: Diagnosis not present

## 2023-01-29 DIAGNOSIS — F03A4 Unspecified dementia, mild, with anxiety: Secondary | ICD-10-CM | POA: Diagnosis not present

## 2023-01-29 DIAGNOSIS — E559 Vitamin D deficiency, unspecified: Secondary | ICD-10-CM

## 2023-01-29 NOTE — Progress Notes (Unsigned)
Patient ID: Ryan Diaz, male   DOB: 1946/10/26, 77 y.o.   MRN: [id number]        Chief Complaint: follow up ckd3a, vit B12 deficiency, dementia, htn, hld       HPI:  Ryan Diaz is a 77 y.o. male here overall doing ok,  Pt denies chest pain, increased sob or doe, wheezing, orthopnea, PND, increased LE swelling, palpitations, dizziness or syncope.   Pt denies polydipsia, polyuria, or new focal neuro s/s.    Pt denies fever, wt loss, night sweats, loss of appetite, or other constitutional symptoms  Dementia overall stable symptomatically, and not assoc with behavioral changes such as hallucinations, paranoia, or agitation.  Taking b12 daily oral.          Wt Readings from Last 3 Encounters:  01/29/23 167 lb (75.8 kg)  01/07/23 167 lb 3.2 oz (75.8 kg)  11/24/22 167 lb (75.8 kg)   BP Readings from Last 3 Encounters:  01/29/23 118/66  01/07/23 112/60  11/24/22 126/68         Past Medical History:  Diagnosis Date   ABUSE, ALCOHOL, CONTINUOUS 08/08/2007   ALLERGIC RHINITIS 08/08/2007   ANXIETY 08/08/2007   Carpal tunnel syndrome    COPD 10/03/2009   DEPRESSION 08/08/2007   ERECTILE DYSFUNCTION 08/08/2007   GENITAL HERPES, HX OF 08/08/2007   GERD 08/08/2007   GLUCOSE INTOLERANCE 08/08/2007   HYPERLIPIDEMIA 08/08/2007   HYPERTENSION 08/08/2007   Impaired glucose tolerance 09/26/2011   Insomnia 05/18/2012   PAIN IN SOFT TISSUES OF LIMB 10/02/2009   Sleep apnea    OSA-uses CPAP nightly   Past Surgical History:  Procedure Laterality Date   ACHILLES TENDON REPAIR Bilateral    CARPAL TUNNEL RELEASE Right 08/18/2018   Procedure: RIGHT CARPAL TUNNEL RELEASE;  Surgeon: Leanora Cover, MD;  Location: [facility];  Service: Orthopedics;  Laterality: Right;   CARPAL TUNNEL RELEASE Left 11/21/2018   Procedure: LEFT CARPAL TUNNEL RELEASE;  Surgeon: Leanora Cover, MD;  Location: [facility];  Service: Orthopedics;  Laterality: Left;  Bier block   COLONOSCOPY     LEFT HEART CATH  AND CORONARY ANGIOGRAPHY N/A 10/17/2020   Procedure: LEFT HEART CATH AND CORONARY ANGIOGRAPHY;  Surgeon: Nelva Bush, MD;  Location: [facility];  Service: Cardiovascular;  Laterality: N/A;    reports that he quit smoking about 13 years ago. His smoking use included cigarettes. He has a 52.50 pack-year smoking history. He has never used smokeless tobacco. He reports current alcohol use of about 46.0 - 53.0 standard drinks of alcohol per week. He reports current drug use. Drug: Marijuana. family history includes Colon polyps in his brother. Allergies  Allergen Reactions   Memantine Other (See Comments)   Sildenafil Palpitations   Current Outpatient Medications on File Prior to Visit  Medication Sig Dispense Refill   albuterol (ACCUNEB) 0.63 MG/3ML nebulizer solution Take 3 mLs (0.63 mg total) by nebulization every 6 (six) hours as needed for wheezing. 75 mL 12   albuterol (VENTOLIN HFA) 108 (90 Base) MCG/ACT inhaler Inhale 2 puffs into the lungs every 6 (six) hours as needed for wheezing or shortness of breath. 18 g 12   aspirin EC 81 MG tablet Take 81 mg by mouth daily.     atorvastatin (LIPITOR) 80 MG tablet Take 1 tablet (80 mg total) by mouth daily. 90 tablet 1   Budeson-Glycopyrrol-Formoterol (BREZTRI AEROSPHERE) 160-9-4.8 MCG/ACT AERO Inhale 2 puffs into the lungs in the morning and at  bedtime. 10.7 g 6   chlorhexidine (PERIDEX) 0.12 % solution      Cholecalciferol (VITAMIN D) 50 MCG (2000 UT) tablet Take 1 tablet (2,000 Units total) by mouth daily. 90 tablet 3   clotrimazole (LOTRIMIN) 1 % cream Apply 1 application topically 2 (two) times daily as needed (irritation).     clotrimazole-betamethasone (LOTRISONE) cream Apply 1 Application topically daily. Use as directed twice per day as needed 30 g 0   cyanocobalamin (VITAMIN B12) 1000 MCG tablet Take 1 tablet (1,000 mcg total) by mouth daily. 90 tablet 3   docusate sodium (COLACE) 100 MG capsule Take 100 mg by mouth daily as  needed for mild constipation.     donepezil (ARICEPT) 10 MG tablet Take 1 tablet (10 mg total) by mouth at bedtime. 90 tablet 3   Ferrous Sulfate 27 MG TABS Take 27 mg by mouth daily as needed (pt prefrence).     fluocinonide ointment (LIDEX) [id number] % Apply 1 application topically 2 (two) times daily. 30 g 2   fluticasone (CUTIVATE) 0.05 % cream Apply 1 application topically daily as needed (irritation).     gabapentin (NEURONTIN) 100 MG capsule Take 1 capsule (100 mg total) by mouth 3 (three) times daily. (Patient taking differently: Take 100 mg by mouth 3 (three) times daily as needed (pain).) 90 capsule 5   hydrocortisone 2.5 % cream SMARTSIG:Topical 1-2 Times Daily PRN     ipratropium (ATROVENT) 0.06 % nasal spray Place 2 sprays into both nostrils 3 (three) times daily. 15 mL 5   ipratropium-albuterol (DUONEB) 0.5-2.5 (3) MG/3ML SOLN Take 3 mLs by nebulization every 6 (six) hours as needed. 360 mL 12   ketoconazole (NIZORAL) 2 % cream Apply 1 Application topically daily. 15 g 1   losartan (COZAAR) 100 MG tablet Take 1 tablet (100 mg total) by mouth daily. 90 tablet 3   meloxicam (MOBIC) 15 MG tablet 1 tab by mouth once daily as needed (Patient taking differently: Take 15 mg by mouth daily as needed for pain. 1 tab by mouth once daily as needed) 90 tablet 1   memantine (NAMENDA) 10 MG tablet Take 1 tablet (10 mg total) by mouth 2 (two) times daily. 180 tablet 3   nitroGLYCERIN (NITROSTAT) 0.4 MG SL tablet Place 1 tablet (0.4 mg total) under the tongue every 5 (five) minutes as needed for chest pain. 25 tablet 4   Omega-3 Fatty Acids (FISH OIL) 1000 MG CAPS Take 1,000 mg by mouth 2 (two) times daily.     omeprazole (PRILOSEC) 20 MG capsule Take 1 capsule (20 mg total) by mouth 2 (two) times daily. (Patient taking differently: Take 20 mg by mouth 2 (two) times daily as needed (acid reflux).) 90 capsule 3   pantoprazole (PROTONIX) 40 MG tablet TAKE ONE TABLET BY MOUTH ONCE EVERY DAY TO CONTROL STOMACH  ACID     sildenafil (VIAGRA) 100 MG tablet Take 0.5-1 tablets (50-100 mg total) by mouth daily as needed for erectile dysfunction. 5 tablet 11   Spacer/Aero-Holding Chambers (AEROCHAMBER MV) inhaler Use as instructed 1 each 0   tadalafil (CIALIS) 20 MG tablet Take 20 mg by mouth daily as needed for erectile dysfunction.     terazosin (HYTRIN) 2 MG capsule Take 1 capsule (2 mg total) by mouth at bedtime. 90 capsule 3   traZODone (DESYREL) 50 MG tablet Take 50 mg by mouth at bedtime as needed for sleep.     cetirizine (ZYRTEC) 10 MG tablet Take 1 tablet (10 mg  total) by mouth daily. 90 tablet 3   No current facility-administered medications on file prior to visit.        ROS:  All others reviewed and negative.  Objective        PE:  BP 118/66   Pulse 81   Temp 98.1 F (36.7 C) (Oral)   Ht 5\' 10"  (1.778 m)   Wt 167 lb (75.8 kg)   SpO2 95%   BMI 23.96 kg/m                 Constitutional: Pt appears in NAD               HENT: Head: NCAT.                Right Ear: External ear normal.                 Left Ear: External ear normal.                Eyes: . Pupils are equal, round, and reactive to light. Conjunctivae and EOM are normal               Nose: without d/c or deformity               Neck: Neck supple. Gross normal ROM               Cardiovascular: Normal rate and regular rhythm.                 Pulmonary/Chest: Effort normal and breath sounds without rales or wheezing.                Abd:  Soft, NT, ND, + BS, no organomegaly               Neurological: Pt is alert. At baseline orientation, motor grossly intact               Skin: Skin is warm. No rashes, no other new lesions, LE edema - none               Psychiatric: Pt behavior is normal without agitation   Micro: none  Cardiac tracings I have personally interpreted today:  none  Pertinent Radiological findings (summarize): none   Lab Results  Component Value Date   WBC 8.2 11/24/2022   HGB 14.1 11/24/2022   HCT  41.8 11/24/2022   PLT 306.0 11/24/2022   GLUCOSE 96 11/24/2022   CHOL 124 11/24/2022   TRIG 192.0 (H) 11/24/2022   HDL 41.70 11/24/2022   LDLDIRECT 96.0 05/13/2021   LDLCALC 44 11/24/2022   ALT 32 11/24/2022   AST 22 11/24/2022   NA 138 11/24/2022   K 3.8 11/24/2022   CL 102 11/24/2022   CREATININE 1.29 11/24/2022   BUN 19 11/24/2022   CO2 28 11/24/2022   TSH 1.07 11/24/2022   PSA 1.57 01/28/2022   HGBA1C 5.9 11/24/2022   Assessment/Plan:  CHARLESTON LANSDOWN is a 77 y.o. Black or African American [2] male with  has a past medical history of ABUSE, ALCOHOL, CONTINUOUS (08/08/2007), ALLERGIC RHINITIS (08/08/2007), ANXIETY (08/08/2007), Carpal tunnel syndrome, COPD (10/03/2009), DEPRESSION (08/08/2007), ERECTILE DYSFUNCTION (08/08/2007), GENITAL HERPES, HX OF (08/08/2007), GERD (08/08/2007), GLUCOSE INTOLERANCE (08/08/2007), HYPERLIPIDEMIA (08/08/2007), HYPERTENSION (08/08/2007), Impaired glucose tolerance (09/26/2011), Insomnia (05/18/2012), PAIN IN SOFT TISSUES OF LIMB (10/02/2009), and Sleep apnea.  Dementia ([place]) Stable, continue aricept 10 mg qd and namenda 10 mg bid  Essential hypertension BP Readings from  Last 3 Encounters:  01/29/23 118/66  01/07/23 112/60  11/24/22 126/68   Stable, pt to continue medical treatment losartan 100 mg qd   HLD (hyperlipidemia) Lab Results  Component Value Date   LDLCALC 44 11/24/2022   Stable, pt to continue current statin lipitor 80 mg qd   Impaired glucose tolerance Lab Results  Component Value Date   HGBA1C 5.9 11/24/2022   Stable, pt to continue current medical treatment  - diet, wt control   Vitamin D deficiency Last vitamin D Lab Results  Component Value Date   VD25OH 46.31 11/24/2022   Stable, cont oral replacement   Vitamin B2 deficiency Lab Results  Component Value Date   VITAMINB12 1,097 (H) 11/24/2022   Overcontrolled, pt cont oral replacement - b12 1000 mcg at decreased to mon wed fri only  CKD (chronic kidney disease)  stage 3, GFR 30-59 ml/min (HCC) Lab Results  Component Value Date   CREATININE 1.29 11/24/2022   Stable overall, cont to avoid nephrotoxins  Followup: Return in about 6 months (around 08/01/2023).  Cathlean Cower, MD 01/31/2023 1:44 PM [facility]

## 2023-01-29 NOTE — Patient Instructions (Signed)
Ok to decrease the B12 1000 mcg to Mon - Wed - Friday  Please continue all other medications as before, and refills have been done if requested.  Please have the pharmacy call with any other refills you may need..  Please keep your appointments with your specialists as you may have planned  No further lab work is needed today  Please make an Appointment to return in 6 months, or sooner if needed

## 2023-01-31 ENCOUNTER — Encounter: Payer: Self-pay | Admitting: Internal Medicine

## 2023-01-31 DIAGNOSIS — E530 Riboflavin deficiency: Secondary | ICD-10-CM | POA: Insufficient documentation

## 2023-01-31 DIAGNOSIS — N183 Chronic kidney disease, stage 3 unspecified: Secondary | ICD-10-CM | POA: Insufficient documentation

## 2023-01-31 NOTE — Assessment & Plan Note (Signed)
Lab Results  Component Value Date   VITAMINB12 1,097 (H) 11/24/2022   Overcontrolled, pt cont oral replacement - b12 1000 mcg at decreased to mon wed fri only

## 2023-01-31 NOTE — Assessment & Plan Note (Signed)
Lab Results  Component Value Date   CREATININE 1.29 11/24/2022   Stable overall, cont to avoid nephrotoxins

## 2023-01-31 NOTE — Assessment & Plan Note (Signed)
BP Readings from Last 3 Encounters:  01/29/23 118/66  01/07/23 112/60  11/24/22 126/68   Stable, pt to continue medical treatment losartan 100 mg qd

## 2023-01-31 NOTE — Assessment & Plan Note (Signed)
Last vitamin D Lab Results  Component Value Date   VD25OH 46.31 11/24/2022   Stable, cont oral replacement

## 2023-01-31 NOTE — Assessment & Plan Note (Signed)
Stable, continue aricept 10 mg qd and namenda 10 mg bid

## 2023-01-31 NOTE — Assessment & Plan Note (Signed)
Lab Results  Component Value Date   HGBA1C 5.9 11/24/2022   Stable, pt to continue current medical treatment  - diet, wt control

## 2023-01-31 NOTE — Assessment & Plan Note (Signed)
Lab Results  Component Value Date   LDLCALC 44 11/24/2022   Stable, pt to continue current statin lipitor 80 mg qd

## 2023-02-22 DIAGNOSIS — G4700 Insomnia, unspecified: Secondary | ICD-10-CM | POA: Diagnosis not present

## 2023-02-22 DIAGNOSIS — R0683 Snoring: Secondary | ICD-10-CM | POA: Diagnosis not present

## 2023-02-22 DIAGNOSIS — G4733 Obstructive sleep apnea (adult) (pediatric): Secondary | ICD-10-CM | POA: Diagnosis not present

## 2023-03-24 ENCOUNTER — Telehealth: Payer: Self-pay | Admitting: Internal Medicine

## 2023-03-24 DIAGNOSIS — G4700 Insomnia, unspecified: Secondary | ICD-10-CM | POA: Diagnosis not present

## 2023-03-24 DIAGNOSIS — G4733 Obstructive sleep apnea (adult) (pediatric): Secondary | ICD-10-CM | POA: Diagnosis not present

## 2023-03-24 DIAGNOSIS — R0683 Snoring: Secondary | ICD-10-CM | POA: Diagnosis not present

## 2023-03-24 NOTE — Telephone Encounter (Signed)
Contacted Guss Bunde Mckee to schedule their annual wellness visit. Appointment made for 04/05/2023.  [facility] Care Guide [facility] AWV TEAM Direct Dial: [phone]

## 2023-03-31 ENCOUNTER — Encounter (HOSPITAL_COMMUNITY): Payer: Self-pay | Admitting: Emergency Medicine

## 2023-03-31 ENCOUNTER — Ambulatory Visit (HOSPITAL_COMMUNITY)
Admission: EM | Admit: 2023-03-31 | Discharge: 2023-03-31 | Disposition: A | Discharge status: Home / Self Care | Payer: PPO | Attending: Emergency Medicine | Admitting: Emergency Medicine

## 2023-03-31 DIAGNOSIS — J069 Acute upper respiratory infection, unspecified: Secondary | ICD-10-CM

## 2023-03-31 DIAGNOSIS — J441 Chronic obstructive pulmonary disease with (acute) exacerbation: Secondary | ICD-10-CM

## 2023-03-31 MED ORDER — AMOXICILLIN-POT CLAVULANATE 875-125 MG PO TABS: 1.0000 | ORAL_TABLET | Freq: Two times a day (BID) | ORAL | 0 refills | Status: DC

## 2023-03-31 MED ORDER — BENZONATATE 200 MG PO CAPS: 200.0000 mg | ORAL_CAPSULE | Freq: Three times a day (TID) | ORAL | 0 refills | Status: AC

## 2023-03-31 MED ORDER — PREDNISONE 20 MG PO TABS: 40.0000 mg | ORAL_TABLET | Freq: Every day | ORAL | 0 refills | Status: AC

## 2023-03-31 NOTE — ED Provider Notes (Signed)
[facility]    CSN: [id number] Arrival date & time: 03/31/23  0930      History   Chief Complaint Chief Complaint  Patient presents with   Cough   Nasal Congestion    HPI Ryan Diaz is a 77 y.o. male.   Patient presents to clinic for symptoms that started fire today.  He reports he is having a productive cough with green phlegm, wheezing that clears with coughing, chest and rib cage tenderness due to coughing.  He denies chest pain, wheezing or shortness of breath.  Denies fevers.  He has been taking TheraFlu and over-the-counter allergy medicine.  Initially he thought his symptoms were due to allergies.  Does have a history of COPD, is using his albuterol and Breztri inhalers as prescribed.     The history is provided by the patient and medical records.  Cough Associated symptoms: sore throat   Associated symptoms: no chest pain, no chills, no fever, no shortness of breath and no wheezing     Past Medical History:  Diagnosis Date   ABUSE, ALCOHOL, CONTINUOUS 08/08/2007   ALLERGIC RHINITIS 08/08/2007   ANXIETY 08/08/2007   Carpal tunnel syndrome    COPD 10/03/2009   DEPRESSION 08/08/2007   ERECTILE DYSFUNCTION 08/08/2007   GENITAL HERPES, HX OF 08/08/2007   GERD 08/08/2007   GLUCOSE INTOLERANCE 08/08/2007   HYPERLIPIDEMIA 08/08/2007   HYPERTENSION 08/08/2007   Impaired glucose tolerance 09/26/2011   Insomnia 05/18/2012   PAIN IN SOFT TISSUES OF LIMB 10/02/2009   Sleep apnea    OSA-uses CPAP nightly    Patient Active Problem List   Diagnosis Date Noted   Vitamin B2 deficiency 01/31/2023   CKD (chronic kidney disease) stage 3, GFR 30-59 ml/min (HCC) 01/31/2023   Encounter for well adult exam with abnormal findings 11/24/2022   Balanitis 11/24/2022   Low sodium levels 08/03/2022   Dementia (HCC) 07/31/2022   Urinary frequency 06/23/2022   Headache 02/25/2022   Hidradenitis 10/04/2021   Ptosis of both eyebrows 10/02/2021   Coronary artery disease  06/13/2021   Alcohol abuse 05/13/2021   Alcohol intake above recommended sensible limits 05/13/2021   Dermatochalasis of right upper eyelid 05/13/2021   Disorder of teeth and supporting structures 05/13/2021   Dry eye syndrome of bilateral lacrimal glands 05/13/2021   Ectropion 05/13/2021   Ex-smoker 05/13/2021   Generalized anxiety disorder 05/13/2021   Hypertrophy of prostate without urinary obstruction and other lower urinary tract symptoms (LUTS) 05/13/2021   Impacted cerumen 05/13/2021   Impotence of organic origin 05/13/2021   Low back pain 05/13/2021   Mechanical ptosis of bilateral eyelids 05/13/2021   Open angle with borderline findings, low risk, bilateral 05/13/2021   Osteoarthritis of wrist 05/13/2021   Other specified disorders of eyelid 05/13/2021   Peyronie's disease 05/13/2021   Senile ectropion of right lower eyelid 05/13/2021   Unspecified ectropion of left lower eyelid 05/13/2021   Preventative health care 05/13/2021   Shortness of breath 10/17/2020   Accelerating angina (HCC) 10/17/2020   Disorder of rotator cuff syndrome of right shoulder and allied disorder 10/04/2020   Wrist pain 07/27/2020   Vitamin D deficiency 06/01/2020   Weight loss 09/19/2019   Rash 03/27/2019   Right foot pain 12/08/2018   Blurred vision, bilateral 10/19/2018   Bilateral carpal tunnel syndrome 07/11/2018   Tenosynovitis of hand 07/11/2018   Right ankle pain 03/09/2018   Facial rash 10/16/2017   Bruising 02/11/2017   Cough 12/16/2016   Wheezing 12/16/2016  Memory dysfunction 07/15/2016   Dizziness 07/10/2015   Mastoiditis 07/10/2015   Fatigue 07/10/2015   Abnormal TSH 07/10/2015   Pulsatile tinnitus of right ear 01/23/2015   Renal insufficiency 01/09/2015   Bilateral ankle pain 04/10/2014   OSA (obstructive sleep apnea) 09/27/2013   Right carpal tunnel syndrome 07/06/2013   Insomnia 05/18/2012   Nocturia 11/18/2011   Impaired glucose tolerance 09/26/2011   Chronic  obstructive lung disease (HCC) 10/03/2009   HLD (hyperlipidemia) 08/08/2007   Anxiety state 08/08/2007   Erectile dysfunction 08/08/2007   ABUSE, ALCOHOL, CONTINUOUS 08/08/2007   Depression 08/08/2007   Essential hypertension 08/08/2007   Allergic rhinitis 08/08/2007   GERD 08/08/2007   GENITAL HERPES, HX OF 08/08/2007    Past Surgical History:  Procedure Laterality Date   ACHILLES TENDON REPAIR Bilateral    CARPAL TUNNEL RELEASE Right 08/18/2018   Procedure: RIGHT CARPAL TUNNEL RELEASE;  Surgeon: Betha Loa, MD;  Location: [facility];  Service: Orthopedics;  Laterality: Right;   CARPAL TUNNEL RELEASE Left 11/21/2018   Procedure: LEFT CARPAL TUNNEL RELEASE;  Surgeon: Betha Loa, MD;  Location: [facility];  Service: Orthopedics;  Laterality: Left;  Bier block   COLONOSCOPY     LEFT HEART CATH AND CORONARY ANGIOGRAPHY N/A 10/17/2020   Procedure: LEFT HEART CATH AND CORONARY ANGIOGRAPHY;  Surgeon: Yvonne Kendall, MD;  Location: [facility];  Service: Cardiovascular;  Laterality: N/A;       Home Medications    Prior to Admission medications   Medication Sig Start Date End Date Taking? Authorizing Provider  amoxicillin-clavulanate (AUGMENTIN) 875-125 MG tablet Take 1 tablet by mouth every 12 (twelve) hours. 03/31/23  Yes Rinaldo Ratel, [place] N, FNP  benzonatate (TESSALON) 200 MG capsule Take 1 capsule (200 mg total) by mouth every 8 (eight) hours for 7 days. 03/31/23 04/07/23 Yes Rinaldo Ratel, [place] N, FNP  predniSONE (DELTASONE) 20 MG tablet Take 2 tablets (40 mg total) by mouth daily for 5 days. 03/31/23 04/05/23 Yes Rinaldo Ratel, [place] N, FNP  albuterol (ACCUNEB) 0.63 MG/3ML nebulizer solution Take 3 mLs (0.63 mg total) by nebulization every 6 (six) hours as needed for wheezing. 10/02/21   Corwin Levins, MD  albuterol (VENTOLIN HFA) 108 (90 Base) MCG/ACT inhaler Inhale 2 puffs into the lungs every 6 (six) hours as needed for wheezing or shortness of breath.  01/07/23   Jetty Duhamel D, MD  aspirin EC 81 MG tablet Take 81 mg by mouth daily.    [provider]  atorvastatin (LIPITOR) 80 MG tablet Take 1 tablet (80 mg total) by mouth daily. 07/13/22   Corwin Levins, MD  Budeson-Glycopyrrol-Formoterol (BREZTRI AEROSPHERE) 160-9-4.8 MCG/ACT AERO Inhale 2 puffs into the lungs in the morning and at bedtime. 07/22/22   Waymon Budge, MD  cetirizine (ZYRTEC) 10 MG tablet Take 1 tablet (10 mg total) by mouth daily. 12/23/21 12/23/22  Corwin Levins, MD  chlorhexidine (PERIDEX) 0.12 % solution  10/31/20   [provider]  Cholecalciferol (VITAMIN D) 50 MCG (2000 UT) tablet Take 1 tablet (2,000 Units total) by mouth daily. 08/19/21   Corwin Levins, MD  clotrimazole (LOTRIMIN) 1 % cream Apply 1 application topically 2 (two) times daily as needed (irritation).    [provider]  clotrimazole-betamethasone (LOTRISONE) cream Apply 1 Application topically daily. Use as directed twice per day as needed 12/08/22   Corwin Levins, MD  cyanocobalamin (VITAMIN B12) 1000 MCG tablet Take 1 tablet (1,000 mcg total) by mouth daily.  07/31/22   Corwin Levins, MD  docusate sodium (COLACE) 100 MG capsule Take 100 mg by mouth daily as needed for mild constipation.    [provider]  donepezil (ARICEPT) 10 MG tablet Take 1 tablet (10 mg total) by mouth at bedtime. 07/31/22   Corwin Levins, MD  Ferrous Sulfate 27 MG TABS Take 27 mg by mouth daily as needed (pt prefrence).    [provider]  fluocinonide ointment (LIDEX) 0.05 % Apply 1 application topically 2 (two) times daily. 09/19/19   Corwin Levins, MD  fluticasone (CUTIVATE) 0.05 % cream Apply 1 application topically daily as needed (irritation). 04/25/20   [provider]  gabapentin (NEURONTIN) 100 MG capsule Take 1 capsule (100 mg total) by mouth 3 (three) times daily. Patient taking differently: Take 100 mg by mouth 3 (three) times daily as needed (pain). 12/08/18   Corwin Levins, MD   hydrocortisone 2.5 % cream SMARTSIG:Topical 1-2 Times Daily PRN 01/26/23   [provider]  ipratropium (ATROVENT) 0.06 % nasal spray Place 2 sprays into both nostrils 3 (three) times daily. 09/19/19   Corwin Levins, MD  ipratropium-albuterol (DUONEB) 0.5-2.5 (3) MG/3ML SOLN Take 3 mLs by nebulization every 6 (six) hours as needed. 07/22/22   Waymon Budge, MD  ketoconazole (NIZORAL) 2 % cream Apply 1 Application topically daily. 11/24/22   Corwin Levins, MD  losartan (COZAAR) 100 MG tablet Take 1 tablet (100 mg total) by mouth daily. 07/31/22   Corwin Levins, MD  meloxicam (MOBIC) 15 MG tablet 1 tab by mouth once daily as needed Patient taking differently: Take 15 mg by mouth daily as needed for pain. 1 tab by mouth once daily as needed 07/26/20   Corwin Levins, MD  memantine (NAMENDA) 10 MG tablet Take 1 tablet (10 mg total) by mouth 2 (two) times daily. 07/31/22   Corwin Levins, MD  nitroGLYCERIN (NITROSTAT) 0.4 MG SL tablet Place 1 tablet (0.4 mg total) under the tongue every 5 (five) minutes as needed for chest pain. 10/09/20   Jake Bathe, MD  Omega-3 Fatty Acids (FISH OIL) 1000 MG CAPS Take 1,000 mg by mouth 2 (two) times daily.    [provider]  omeprazole (PRILOSEC) 20 MG capsule Take 1 capsule (20 mg total) by mouth 2 (two) times daily. Patient taking differently: Take 20 mg by mouth 2 (two) times daily as needed (acid reflux). 04/14/18   Corwin Levins, MD  pantoprazole (PROTONIX) 40 MG tablet TAKE ONE TABLET BY MOUTH ONCE EVERY DAY TO CONTROL STOMACH ACID 03/10/22   [provider]  sildenafil (VIAGRA) 100 MG tablet Take 0.5-1 tablets (50-100 mg total) by mouth daily as needed for erectile dysfunction. 11/24/22   Corwin Levins, MD  Spacer/Aero-Holding Chambers (AEROCHAMBER MV) inhaler Use as instructed 07/06/22   Waymon Budge, MD  tadalafil (CIALIS) 20 MG tablet Take 20 mg by mouth daily as needed for erectile dysfunction.    [provider]  terazosin  (HYTRIN) 2 MG capsule Take 1 capsule (2 mg total) by mouth at bedtime. 11/07/21   Corwin Levins, MD  traZODone (DESYREL) 50 MG tablet Take 50 mg by mouth at bedtime as needed for sleep.    [provider]    Family History Family History  Problem Relation Age of Onset   Colon polyps Brother     Social History Social History   Tobacco Use   Smoking status: Former  Packs/day: 1.50    Years: 35.00    Additional pack years: 0.00    Total pack years: 52.50    Types: Cigarettes    Quit date: 09/04/2009    Years since quitting: 13.5   Smokeless tobacco: Never  Vaping Use   Vaping Use: Never used  Substance Use Topics   Alcohol use: Yes    Alcohol/week: 46.0 - 53.0 standard drinks of alcohol    Types: 28 - 35 Cans of beer, 18 Standard drinks or equivalent per week    Comment: everyday drinker amount varies    Drug use: Yes    Types: Marijuana    Comment: marijuana occassionally     Allergies   Memantine and Sildenafil   Review of Systems Review of Systems  Constitutional:  Negative for chills and fever.  HENT:  Positive for congestion and sore throat.   Respiratory:  Positive for cough. Negative for shortness of breath and wheezing.   Cardiovascular:  Negative for chest pain.  Gastrointestinal:  Negative for abdominal pain.     Physical Exam Triage Vital Signs ED Triage Vitals [03/31/23 0941]  Enc Vitals Group     BP 122/74     Pulse Rate 76     Resp 18     Temp 98.2 F (36.8 C)     Temp Source Oral     SpO2 97 %     Weight      Height      Head Circumference      Peak Flow      Pain Score 8     Pain Loc      Pain Edu?      Excl. in GC?    No data found.  Updated Vital Signs BP 122/74 (BP Location: Left Arm)   Pulse 76   Temp 98.2 F (36.8 C) (Oral)   Resp 18   SpO2 97%   Visual Acuity Right Eye Distance:   Left Eye Distance:   Bilateral Distance:    Right Eye Near:   Left Eye Near:    Bilateral Near:     Physical  Exam Vitals and nursing note reviewed.  Constitutional:      Appearance: Normal appearance.  HENT:     Head: Normocephalic and atraumatic.     Right Ear: External ear normal.     Left Ear: External ear normal.     Nose: Congestion present.     Mouth/Throat:     Mouth: Mucous membranes are moist.     Pharynx: Posterior oropharyngeal erythema present.  Eyes:     Conjunctiva/sclera: Conjunctivae normal.  Cardiovascular:     Rate and Rhythm: Normal rate and regular rhythm.     Heart sounds: Normal heart sounds. No murmur heard. Pulmonary:     Effort: Pulmonary effort is normal. No respiratory distress.     Breath sounds: Normal breath sounds. No wheezing.  Musculoskeletal:     Cervical back: Normal range of motion.  Lymphadenopathy:     Cervical: Cervical adenopathy present.  Skin:    General: Skin is warm and dry.  Neurological:     General: No focal deficit present.     Mental Status: He is alert and oriented to person, place, and time.  Psychiatric:        Mood and Affect: Mood normal.        Behavior: Behavior normal.      UC Treatments / Results  Labs (all labs ordered are listed,  but only abnormal results are displayed) Labs Reviewed - No data to display  EKG   Radiology No results found.  Procedures Procedures (including critical care time)  Medications Ordered in UC Medications - No data to display  Initial Impression / Assessment and Plan / UC Course  I have reviewed the triage vital signs and the nursing notes.  Pertinent labs & imaging results that were available during my care of the patient were reviewed by me and considered in my medical decision making (see chart for details).  Vitals and triage reviewed, patient is hemodynamically stable.  Lungs vesicular posteriorly, oxygenation 97% on room air.  Due to increased sputum and sputum purulence, will cover for COPD exacerbation with prednisone burst and Augmentin.  Normal renal function and hepatic  function as of January.  Will defer imaging due to lack of fever, shortness of breath, wheezing or adventitious lung sounds.  Advised to follow-up with PCP or this clinic if no improvement in symptoms despite medication, or any new concerning symptoms.  Plan of care, follow-up care, and return precautions discussed, no questions at this time.     Final Clinical Impressions(s) / UC Diagnoses   Final diagnoses:  Upper respiratory tract infection, unspecified type  COPD exacerbation (HCC)     Discharge Instructions      Overall, I believe your symptoms are due to a viral upper respiratory tract infection.  Please take the antibiotics as prescribed and until finished, you can take them with food to help prevent gastrointestinal upset.  I am also covering you for a COPD exacerbation with your ongoing cough, you can take the prednisone by mouth daily with breakfast for the next 5 days.  You can take the [facility] as needed for cough suppression.  Please ensure you are drinking plenty of water to stay hydrated while you are sick, at least 64 ounces daily.  Please return to clinic or follow-up with your primary care provider if you develop wheezing, shortness of breath, chest pain, high fevers, or any new concerning symptoms.      ED Prescriptions     Medication Sig Dispense Auth. Provider   amoxicillin-clavulanate (AUGMENTIN) 875-125 MG tablet Take 1 tablet by mouth every 12 (twelve) hours. 14 tablet Rinaldo Ratel, [place] N, [place]   predniSONE (DELTASONE) 20 MG tablet Take 2 tablets (40 mg total) by mouth daily for 5 days. 10 tablet Rinaldo Ratel, [place] N, FNP   benzonatate (TESSALON) 200 MG capsule Take 1 capsule (200 mg total) by mouth every 8 (eight) hours for 7 days. 21 capsule Haruki Arnold, [place] N, [place]      PDMP not reviewed this encounter.   Feather Berrie, [place] N, [place] 03/31/23 1003

## 2023-03-31 NOTE — Discharge Instructions (Addendum)
Overall, I believe your symptoms are due to a viral upper respiratory tract infection.  Please take the antibiotics as prescribed and until finished, you can take them with food to help prevent gastrointestinal upset.  I am also covering you for a COPD exacerbation with your ongoing cough, you can take the prednisone by mouth daily with breakfast for the next 5 days.  You can take the [facility] as needed for cough suppression.  Please ensure you are drinking plenty of water to stay hydrated while you are sick, at least 64 ounces daily.  Please return to clinic or follow-up with your primary care provider if you develop wheezing, shortness of breath, chest pain, high fevers, or any new concerning symptoms.

## 2023-03-31 NOTE — ED Triage Notes (Signed)
Pt c/o cough and congestion for several days. Cough is sometimes productive getting up light green phlegm. Past couple days has been dry and causing rib/abd pains.  Taking OTC Theraflu for symptoms.

## 2023-04-01 ENCOUNTER — Ambulatory Visit: Payer: PPO | Admitting: Family Medicine

## 2023-04-05 ENCOUNTER — Telehealth: Payer: Self-pay | Admitting: Internal Medicine

## 2023-04-05 NOTE — Telephone Encounter (Signed)
Do you knw anything abt this.../l,mb

## 2023-04-05 NOTE — Telephone Encounter (Signed)
Patient had a telephone visit scheduled for his AWV today 04/05/2023. He said he never received a call. The phone number he has is his home phone and he needs to pick up his grandchild, so he wanted to inform whoever was supposed to call. Best callback is [phone].

## 2023-04-08 ENCOUNTER — Ambulatory Visit (INDEPENDENT_AMBULATORY_CARE_PROVIDER_SITE_OTHER): Payer: PPO

## 2023-04-08 ENCOUNTER — Ambulatory Visit: Payer: PPO | Admitting: Internal Medicine

## 2023-04-08 ENCOUNTER — Encounter: Payer: Self-pay | Admitting: Internal Medicine

## 2023-04-08 VITALS — BP 122/62 | HR 83 | Ht 70.0 in | Wt 169.0 lb

## 2023-04-08 DIAGNOSIS — J441 Chronic obstructive pulmonary disease with (acute) exacerbation: Secondary | ICD-10-CM | POA: Diagnosis not present

## 2023-04-08 DIAGNOSIS — G4733 Obstructive sleep apnea (adult) (pediatric): Secondary | ICD-10-CM | POA: Diagnosis not present

## 2023-04-08 MED ORDER — TRELEGY ELLIPTA 100-62.5-25 MCG/ACT IN AEPB: INHALATION_SPRAY | RESPIRATORY_TRACT | 12 refills | Status: AC

## 2023-04-08 MED ORDER — ALBUTEROL SULFATE HFA 108 (90 BASE) MCG/ACT IN AERS: 2.0000 | INHALATION_SPRAY | Freq: Four times a day (QID) | RESPIRATORY_TRACT | 12 refills | Status: AC | PRN

## 2023-04-08 NOTE — Progress Notes (Signed)
HPI male former smoker followed for OSA, insomnia, complicated by ETOH, COPD, GERD, HBP NPSG 10/31/13- AHI 52/hour, desaturation to 83%, body weight 165 pounds Unattended Home Sleep Test 09/02/2016- AHI 9.4/hour, desaturation to 87%, body weight 163 pounds Office Spirometry 01/2019-moderate obstructive airways disease.  FVC 3.6/92%, FEV1 2.0/67%, ratio 1.54, FEF 25-75% 1.9/45% PFT 10/16/20- moderately severe obstruction, some resp to BD, mod severe DLCO deficit, emphysema pattern FEV1/FVC 0.52 Walk test room air 01/22/21- lowest sat 91%, max HR 10 PFT 07/07/22- Moderately severe obstruction, insignif resp to BD, severe DLCO deficit Walk Test on Room 07/06/22- Max HR 97/ min, minimum O2 sat 92%. -------------------------------------------------------------------------------------   01/07/23-77 year old male former smoker followed for OSA, insomnia, complicated by history EtOH, COPD, GERD, HTN, -Albuterol hfa,  Neb albuterol/ Duoneb, Breztri,  Atrovent nasal,   Trazodone 50 CPAP auto 5-20/[employer]/ [employer]  AirSense 11 AutoSet   Download compliance- 77%, AHI 9/ hr     Centrals 3.1, Obst 4.7 Body weight today-   167 lbs    (168 in Sept, 2022) PFT 07/07/22- Moderately severe obstruction, insignif resp to BD, severe DLCO deficit -----Pt is doing okay CPAP occasionally irritates his nostrils and he takes it off.  Download reviewed.  We discussed trial of nasal saline gel.  Residual AHI is a little high but some of it is central apneas. Aware of dyspnea on exertion.  Rarely uses his albuterol inhaler.  We discussed this and appropriate indications.  Sometimes if he really pushes he gets "stinging" precordial discomfort that he thinks is related to his breathing.  I discussed possibility of angina with instruction to report if it gets worse, etc.  He will discuss with PCP.  04/08/23-77 year old male former smoker followed for OSA, insomnia, complicated by history EtOH, COPD, GERD, HTN, -Albuterol hfa,  Neb  albuterol/ Duoneb, Breztri> Trelegy,  Atrovent nasal,   Trazodone 50 CPAP auto 5-20/[employer]/ [employer]  AirSense 11 AutoSet   Download compliance-  Body weight today-    Acute visit-  -SOB increased over last 3-4 months with exertion, productive cough with yellow phlegm x2 weeks Still on Augmentin from recent urgent care visit.  Finished prednisone. Has had bronchitis exacerbation with yellow sputum.  Noting increased dyspnea on exertion especially if he hurries up stairs or to carry his grandchild. He asks to change from [facility] to Trelegy which he thought worked better for him.  ROS-see HPI   + = positive Constitutional:    weight loss, night sweats, fevers, chills, +fatigue, lassitude. HEENT:    headaches, difficulty swallowing, tooth/dental problems, sore throat,       sneezing, itching, ear ache, nasal congestion, post nasal drip, snoring CV:    +chest pain, orthopnea, PND, swelling in lower extremities, anasarca,                                  izziness, palpitations Resp:   +shortness of breath with exertion or at rest.                productive cough,   non-productive cough, coughing up of blood.              change in color of mucus.  wheezing.   Skin:    rash or lesions. GI:  + heartburn, indigestion, abdominal pain, nausea, vomiting, diarrhea,                 change in bowel habits, loss of appetite GU:  dysuria, change in color of urine, no urgency or frequency.   flank pain. MS:   joint pain, stiffness, decreased range of motion, back pain. Neuro-     nothing unusual Psych:  change in mood or affect.  depression or anxiety.   memory loss.  OBJ- Physical Exam        + no acute distress today, talkative           General- Alert, Oriented, Affect-appropriate, Distress- none acute, + slender Skin- rash-none, lesions- none, excoriation- none Lymphadenopathy- none Head- atraumatic            Eyes- Gross vision intact, PERRLA, conjunctivae and secretions clear            Ears-  Hearing, canals-normal            Nose- Clear, no-Septal dev, mucus, polyps, erosion, perforation             Throat- Mallampati III-IV , mucosa clear , drainage- none, tonsils- atrophic Neck- flexible , trachea midline, no stridor , thyroid nl, carotid no bruit Chest - symmetrical excursion , unlabored           Heart/CV- RRR , no murmur , no gallop  , no rub, nl s1 s2                           - JVD- none , edema- none, stasis changes- none, varices- none           Lung- clear to P&A, wheeze- none, cough- none , dullness-none, rub- none           Chest wall-  Abd-  Br/ Gen/ Rectal- Not done, not indicated Extrem- cyanosis- none, clubbing, none, atrophy- none, strength- nl Neuro- grossly intact to observation

## 2023-04-08 NOTE — Patient Instructions (Addendum)
Script sent changing Breztri to Trelegy inhaler   inhale 1 puff then rinse mouth, once daily  Order- CXR   dx exacerbation COPD  Order sample Trelegy 100 inhale 1 puff then rinse mouth, oncee daily. Instead of Breztri.  Finish your augmentin antibiotic  Keep February appointment

## 2023-04-09 ENCOUNTER — Ambulatory Visit: Payer: PPO | Admitting: Student

## 2023-04-13 ENCOUNTER — Other Ambulatory Visit: Payer: Self-pay | Admitting: Internal Medicine

## 2023-04-15 ENCOUNTER — Telehealth: Payer: Self-pay | Admitting: Internal Medicine

## 2023-04-15 ENCOUNTER — Ambulatory Visit: Payer: PPO | Admitting: Internal Medicine

## 2023-04-15 NOTE — Telephone Encounter (Signed)
PT states he is ret Dr Roxy Cedar nurse's call. I can see no notations. Please call @ [phone]  No active requests No Tel Encounter Nothing on the "Communication side bar" Nothing on [facility]

## 2023-04-15 NOTE — Telephone Encounter (Signed)
Pt returning missed call for CXR Results

## 2023-04-16 NOTE — Telephone Encounter (Signed)
Pt calling back because he was told he should hear something back about his results and he goes out to his Exercise class and doesn't come back until 11 am. Plese call back after 11 am

## 2023-04-16 NOTE — Telephone Encounter (Signed)
Went over chest xray results with patient. He verbalized NFN

## 2023-04-22 DIAGNOSIS — G4733 Obstructive sleep apnea (adult) (pediatric): Secondary | ICD-10-CM | POA: Diagnosis not present

## 2023-04-22 DIAGNOSIS — G4700 Insomnia, unspecified: Secondary | ICD-10-CM | POA: Diagnosis not present

## 2023-04-24 DIAGNOSIS — R0683 Snoring: Secondary | ICD-10-CM | POA: Diagnosis not present

## 2023-04-24 DIAGNOSIS — G4733 Obstructive sleep apnea (adult) (pediatric): Secondary | ICD-10-CM | POA: Diagnosis not present

## 2023-04-24 DIAGNOSIS — G4700 Insomnia, unspecified: Secondary | ICD-10-CM | POA: Diagnosis not present

## 2023-04-26 ENCOUNTER — Ambulatory Visit: Payer: PPO | Admitting: Internal Medicine

## 2023-04-27 DIAGNOSIS — H10403 Unspecified chronic conjunctivitis, bilateral: Secondary | ICD-10-CM | POA: Diagnosis not present

## 2023-05-24 DIAGNOSIS — G4700 Insomnia, unspecified: Secondary | ICD-10-CM | POA: Diagnosis not present

## 2023-05-24 DIAGNOSIS — G4733 Obstructive sleep apnea (adult) (pediatric): Secondary | ICD-10-CM | POA: Diagnosis not present

## 2023-05-24 DIAGNOSIS — R0683 Snoring: Secondary | ICD-10-CM | POA: Diagnosis not present

## 2023-05-28 ENCOUNTER — Encounter: Payer: Self-pay | Admitting: Internal Medicine

## 2023-05-28 NOTE — Assessment & Plan Note (Signed)
He continues to benefit from CPAP.

## 2023-05-28 NOTE — Assessment & Plan Note (Signed)
Due to exacerbation of COPD Plan-finish antibiotic.  Samples of Trelegy 100 to replace Breztri.  CXR.

## 2023-06-18 ENCOUNTER — Ambulatory Visit (INDEPENDENT_AMBULATORY_CARE_PROVIDER_SITE_OTHER): Payer: PPO | Admitting: Internal Medicine

## 2023-06-18 ENCOUNTER — Encounter: Payer: Self-pay | Admitting: Internal Medicine

## 2023-06-18 VITALS — BP 110/64 | HR 69 | Temp 98.7°F | Ht 68.25 in | Wt 164.0 lb

## 2023-06-18 DIAGNOSIS — E530 Riboflavin deficiency: Secondary | ICD-10-CM

## 2023-06-18 DIAGNOSIS — F03A4 Unspecified dementia, mild, with anxiety: Secondary | ICD-10-CM

## 2023-06-18 DIAGNOSIS — N1831 Chronic kidney disease, stage 3a: Secondary | ICD-10-CM | POA: Diagnosis not present

## 2023-06-18 DIAGNOSIS — I10 Essential (primary) hypertension: Secondary | ICD-10-CM

## 2023-06-18 DIAGNOSIS — R7302 Impaired glucose tolerance (oral): Secondary | ICD-10-CM

## 2023-06-18 DIAGNOSIS — E7800 Pure hypercholesterolemia, unspecified: Secondary | ICD-10-CM | POA: Diagnosis not present

## 2023-06-18 DIAGNOSIS — E559 Vitamin D deficiency, unspecified: Secondary | ICD-10-CM

## 2023-06-18 DIAGNOSIS — J449 Chronic obstructive pulmonary disease, unspecified: Secondary | ICD-10-CM

## 2023-06-18 DIAGNOSIS — J309 Allergic rhinitis, unspecified: Secondary | ICD-10-CM | POA: Diagnosis not present

## 2023-06-18 LAB — BASIC METABOLIC PANEL
BUN: 27 mg/dL — ABNORMAL HIGH (ref 6–23)
CO2: 28 mEq/L (ref 19–32)
Calcium: 10.3 mg/dL (ref 8.4–10.5)
Chloride: 102 mEq/L (ref 96–112)
Creatinine, Ser: 1.63 mg/dL — ABNORMAL HIGH (ref 0.40–1.50)
GFR: 40.44 mL/min — ABNORMAL LOW (ref >60.00)
Glucose, Bld: 101 mg/dL — ABNORMAL HIGH (ref 70–99)
Potassium: 4.6 mEq/L (ref 3.5–5.1)
Sodium: 138 mEq/L (ref 135–145)

## 2023-06-18 LAB — LIPID PANEL
Cholesterol: 139 mg/dL (ref 0–200)
HDL: 39.7 mg/dL (ref >39.00)
LDL Cholesterol: 71 mg/dL (ref 0–99)
NonHDL: 99.48
Total CHOL/HDL Ratio: 4
Triglycerides: 141 mg/dL (ref 0.0–149.0)
VLDL: 28.2 mg/dL (ref 0.0–40.0)

## 2023-06-18 LAB — HEPATIC FUNCTION PANEL
ALT: 29 U/L (ref 0–53)
AST: 26 U/L (ref 0–37)
Albumin: 4.5 g/dL (ref 3.5–5.2)
Alkaline Phosphatase: 80 U/L (ref 39–117)
Bilirubin, Direct: 0.2 mg/dL (ref 0.0–0.3)
Total Bilirubin: 0.9 mg/dL (ref 0.2–1.2)
Total Protein: 7.8 g/dL (ref 6.0–8.3)

## 2023-06-18 LAB — VITAMIN D 25 HYDROXY (VIT D DEFICIENCY, FRACTURES): VITD: 67.24 ng/mL (ref 30.00–100.00)

## 2023-06-18 LAB — HEMOGLOBIN A1C: Hgb A1c MFr Bld: 5.8 % (ref 4.6–6.5)

## 2023-06-18 LAB — VITAMIN B12: Vitamin B-12: 557 pg/mL (ref 211–911)

## 2023-06-18 NOTE — Patient Instructions (Signed)

## 2023-06-18 NOTE — Progress Notes (Unsigned)
Patient ID: Ryan Diaz, male   DOB: [birth year], 77 y.o.   MRN: [id number]        Chief Complaint: follow up HTN, HLD and hyperglycemia ,demetia       HPI:  Ryan Diaz is a 77 y.o. male here with c/o more forgetful lately, prevagen not really working, no ETOH use the whole last month.         Wt Readings from Last 3 Encounters:  06/18/23 164 lb (74.4 kg)  04/08/23 169 lb (76.7 kg)  01/29/23 167 lb (75.8 kg)   BP Readings from Last 3 Encounters:  06/18/23 110/64  04/08/23 122/62  03/31/23 122/74         Past Medical History:  Diagnosis Date   ABUSE, ALCOHOL, CONTINUOUS 08/08/2007   ALLERGIC RHINITIS 08/08/2007   ANXIETY 08/08/2007   Carpal tunnel syndrome    COPD 10/03/2009   DEPRESSION 08/08/2007   ERECTILE DYSFUNCTION 08/08/2007   GENITAL HERPES, HX OF 08/08/2007   GERD 08/08/2007   GLUCOSE INTOLERANCE 08/08/2007   HYPERLIPIDEMIA 08/08/2007   HYPERTENSION 08/08/2007   Impaired glucose tolerance 09/26/2011   Insomnia 05/18/2012   PAIN IN SOFT TISSUES OF LIMB 10/02/2009   Sleep apnea    OSA-uses CPAP nightly   Past Surgical History:  Procedure Laterality Date   ACHILLES TENDON REPAIR Bilateral    CARPAL TUNNEL RELEASE Right 08/18/2018   Procedure: RIGHT CARPAL TUNNEL RELEASE;  Surgeon: Betha Loa, MD;  Location: [facility];  Service: Orthopedics;  Laterality: Right;   CARPAL TUNNEL RELEASE Left 11/21/2018   Procedure: LEFT CARPAL TUNNEL RELEASE;  Surgeon: Betha Loa, MD;  Location: [facility];  Service: Orthopedics;  Laterality: Left;  Bier block   COLONOSCOPY     LEFT HEART CATH AND CORONARY ANGIOGRAPHY N/A 10/17/2020   Procedure: LEFT HEART CATH AND CORONARY ANGIOGRAPHY;  Surgeon: Yvonne Kendall, MD;  Location: [facility];  Service: Cardiovascular;  Laterality: N/A;    reports that he quit smoking about 13 years ago. His smoking use included cigarettes. He started smoking about 48 years ago. He has a 52.5 pack-year smoking history. He  has never used smokeless tobacco. He reports current alcohol use of about 46.0 - 53.0 standard drinks of alcohol per week. He reports current drug use. Drug: Marijuana. family history includes Colon polyps in his brother. Allergies  Allergen Reactions   Memantine Other (See Comments)   Sildenafil Palpitations   Current Outpatient Medications on File Prior to Visit  Medication Sig Dispense Refill   albuterol (ACCUNEB) 0.63 MG/3ML nebulizer solution Take 3 mLs (0.63 mg total) by nebulization every 6 (six) hours as needed for wheezing. 75 mL 12   albuterol (VENTOLIN HFA) 108 (90 Base) MCG/ACT inhaler Inhale 2 puffs into the lungs every 6 (six) hours as needed for wheezing or shortness of breath. 18 g 12   amoxicillin-clavulanate (AUGMENTIN) 875-125 MG tablet Take 1 tablet by mouth every 12 (twelve) hours. 14 tablet 0   aspirin EC 81 MG tablet Take 81 mg by mouth daily.     atorvastatin (LIPITOR) 80 MG tablet Take 1 tablet by mouth once daily 90 tablet 2   cetirizine (ZYRTEC) 10 MG tablet Take 1 tablet (10 mg total) by mouth daily. 90 tablet 3   chlorhexidine (PERIDEX) 0.12 % solution      Cholecalciferol (VITAMIN D) 50 MCG (2000 UT) tablet Take 1 tablet (2,000 Units total) by mouth daily. 90 tablet 3   clotrimazole (LOTRIMIN) 1 %  cream Apply 1 application topically 2 (two) times daily as needed (irritation).     clotrimazole-betamethasone (LOTRISONE) cream Apply 1 Application topically daily. Use as directed twice per day as needed 30 g 0   cyanocobalamin (VITAMIN B12) 1000 MCG tablet Take 1 tablet (1,000 mcg total) by mouth daily. 90 tablet 3   docusate sodium (COLACE) 100 MG capsule Take 100 mg by mouth daily as needed for mild constipation.     donepezil (ARICEPT) 10 MG tablet Take 1 tablet (10 mg total) by mouth at bedtime. 90 tablet 3   Ferrous Sulfate 27 MG TABS Take 27 mg by mouth daily as needed (pt prefrence).     fluocinonide ointment (LIDEX) 0.05 % Apply 1 application topically 2 (two)  times daily. 30 g 2   fluticasone (CUTIVATE) 0.05 % cream Apply 1 application topically daily as needed (irritation).     Fluticasone-Umeclidin-Vilant (TRELEGY ELLIPTA) 100-62.5-25 MCG/ACT AEPB Inhale 1 puff then rinse mouth, once daily 60 each 12   gabapentin (NEURONTIN) 100 MG capsule Take 1 capsule (100 mg total) by mouth 3 (three) times daily. (Patient taking differently: Take 100 mg by mouth 3 (three) times daily as needed (pain).) 90 capsule 5   hydrocortisone 2.5 % cream SMARTSIG:Topical 1-2 Times Daily PRN     ipratropium (ATROVENT) 0.06 % nasal spray Place 2 sprays into both nostrils 3 (three) times daily. 15 mL 5   ipratropium-albuterol (DUONEB) 0.5-2.5 (3) MG/3ML SOLN Take 3 mLs by nebulization every 6 (six) hours as needed. 360 mL 12   ketoconazole (NIZORAL) 2 % cream Apply 1 Application topically daily. 15 g 1   losartan (COZAAR) 100 MG tablet Take 1 tablet (100 mg total) by mouth daily. 90 tablet 3   meloxicam (MOBIC) 15 MG tablet 1 tab by mouth once daily as needed (Patient taking differently: Take 15 mg by mouth daily as needed for pain. 1 tab by mouth once daily as needed) 90 tablet 1   memantine (NAMENDA) 10 MG tablet Take 1 tablet (10 mg total) by mouth 2 (two) times daily. 180 tablet 3   nitroGLYCERIN (NITROSTAT) 0.4 MG SL tablet Place 1 tablet (0.4 mg total) under the tongue every 5 (five) minutes as needed for chest pain. 25 tablet 4   Omega-3 Fatty Acids (FISH OIL) 1000 MG CAPS Take 1,000 mg by mouth 2 (two) times daily.     omeprazole (PRILOSEC) 20 MG capsule Take 1 capsule (20 mg total) by mouth 2 (two) times daily. (Patient taking differently: Take 20 mg by mouth 2 (two) times daily as needed (acid reflux).) 90 capsule 3   pantoprazole (PROTONIX) 40 MG tablet TAKE ONE TABLET BY MOUTH ONCE EVERY DAY TO CONTROL STOMACH ACID     sildenafil (VIAGRA) 100 MG tablet Take 0.5-1 tablets (50-100 mg total) by mouth daily as needed for erectile dysfunction. 5 tablet 11    Spacer/Aero-Holding Chambers (AEROCHAMBER MV) inhaler Use as instructed 1 each 0   tadalafil (CIALIS) 20 MG tablet Take 20 mg by mouth daily as needed for erectile dysfunction.     terazosin (HYTRIN) 2 MG capsule Take 1 capsule (2 mg total) by mouth at bedtime. 90 capsule 3   traZODone (DESYREL) 50 MG tablet Take 50 mg by mouth at bedtime as needed for sleep.     No current facility-administered medications on file prior to visit.        ROS:  All others reviewed and negative.  Objective        PE:  BP 110/64   Pulse 69   Temp 98.7 F (37.1 C) (Oral)   Ht 5' 8.25" (1.734 m)   Wt 164 lb (74.4 kg)   SpO2 94%   BMI 24.75 kg/m                 Constitutional: Pt appears in NAD               HENT: Head: NCAT.                Right Ear: External ear normal.                 Left Ear: External ear normal.                Eyes: . Pupils are equal, round, and reactive to light. Conjunctivae and EOM are normal               Nose: without d/c or deformity               Neck: Neck supple. Gross normal ROM               Cardiovascular: Normal rate and regular rhythm.                 Pulmonary/Chest: Effort normal and breath sounds without rales or wheezing.                Abd:  Soft, NT, ND, + BS, no organomegaly               Neurological: Pt is alert. At baseline orientation, motor grossly intact               Skin: Skin is warm. No rashes, no other new lesions, LE edema - ***               Psychiatric: Pt behavior is normal without agitation   Micro: none  Cardiac tracings I have personally interpreted today:  none  Pertinent Radiological findings (summarize): none   Lab Results  Component Value Date   WBC 8.2 11/24/2022   HGB 14.1 11/24/2022   HCT 41.8 11/24/2022   PLT 306.0 11/24/2022   GLUCOSE 96 11/24/2022   CHOL 124 11/24/2022   TRIG 192.0 (H) 11/24/2022   HDL 41.70 11/24/2022   LDLDIRECT 96.0 05/13/2021   LDLCALC 44 11/24/2022   ALT 32 11/24/2022   AST 22 11/24/2022    NA 138 11/24/2022   K 3.8 11/24/2022   CL 102 11/24/2022   CREATININE 1.29 11/24/2022   BUN 19 11/24/2022   CO2 28 11/24/2022   TSH 1.07 11/24/2022   PSA 1.57 01/28/2022   HGBA1C 5.9 11/24/2022   Assessment/Plan:  Ryan Diaz is a 77 y.o. Black or African American [2] male with  has a past medical history of ABUSE, ALCOHOL, CONTINUOUS (08/08/2007), ALLERGIC RHINITIS (08/08/2007), ANXIETY (08/08/2007), Carpal tunnel syndrome, COPD (10/03/2009), DEPRESSION (08/08/2007), ERECTILE DYSFUNCTION (08/08/2007), GENITAL HERPES, HX OF (08/08/2007), GERD (08/08/2007), GLUCOSE INTOLERANCE (08/08/2007), HYPERLIPIDEMIA (08/08/2007), HYPERTENSION (08/08/2007), Impaired glucose tolerance (09/26/2011), Insomnia (05/18/2012), PAIN IN SOFT TISSUES OF LIMB (10/02/2009), and Sleep apnea.  No problem-specific Assessment & Plan notes found for this encounter.  Followup: No follow-ups on file.  Oliver Barre, MD 06/18/2023 8:57 AM [facility]

## 2023-06-20 ENCOUNTER — Encounter: Payer: Self-pay | Admitting: Internal Medicine

## 2023-06-20 NOTE — Assessment & Plan Note (Signed)
Stable overall, pt encouraged to have good compliance with aricept and namenda

## 2023-06-20 NOTE — Assessment & Plan Note (Signed)
Lab Results  Component Value Date   VITAMINB12 557 06/18/2023   Stable, cont oral replacement - b12 1000 mcg qd

## 2023-06-20 NOTE — Assessment & Plan Note (Signed)
Stable overall, cont inhaler prn 

## 2023-06-20 NOTE — Assessment & Plan Note (Signed)
Lab Results  Component Value Date   CREATININE 1.63 (H) 06/18/2023   Stable overall, cont to avoid nephrotoxins

## 2023-06-20 NOTE — Assessment & Plan Note (Signed)
Last vitamin D Lab Results  Component Value Date   VD25OH 67.24 06/18/2023   Stable, cont oral replacement

## 2023-06-20 NOTE — Assessment & Plan Note (Signed)
Lab Results  Component Value Date   LDLCALC 71 06/18/2023   Mild uncontrolled, pt to continue current statin lipitor 80 mg - encouraged good compliance, declines add zetia for now

## 2023-06-20 NOTE — Assessment & Plan Note (Signed)
BP Readings from Last 3 Encounters:  06/18/23 110/64  04/08/23 122/62  03/31/23 122/74   Stable, pt to continue medical treatment losartan 100 qd

## 2023-06-20 NOTE — Assessment & Plan Note (Signed)
Lab Results  Component Value Date   HGBA1C 5.8 06/18/2023   Stable, pt to continue current medical treatment  - diet, wt control

## 2023-06-20 NOTE — Assessment & Plan Note (Signed)
Mild to mod, for add zyrtec 10 qd,  to f/u any worsening symptoms or concerns

## 2023-06-21 ENCOUNTER — Ambulatory Visit: Payer: PPO

## 2023-06-21 VITALS — BP 110/64 | HR 69 | Temp 98.7°F | Ht 68.25 in | Wt 164.0 lb

## 2023-06-21 DIAGNOSIS — Z0000 Encounter for general adult medical examination without abnormal findings: Secondary | ICD-10-CM | POA: Diagnosis not present

## 2023-06-21 NOTE — Patient Instructions (Signed)
It was great speaking with you today!  Please schedule your next Medicare Wellness Visit with your Nurse Health Advisor in 1 year by calling [phone]. 

## 2023-06-21 NOTE — Progress Notes (Signed)
Subjective:   Ryan Diaz is a 77 y.o. male who presents for Medicare Annual/Subsequent preventive examination.  Visit Complete: Virtual  I connected with  Ryan Diaz on 06/21/23 by a audio enabled telemedicine application and verified that I am speaking with the correct person using two identifiers.  Patient Location: Home  Provider Location: Office/Clinic  I discussed the limitations of evaluation and management by telemedicine. The patient expressed understanding and agreed to proceed.  Patient Medicare AWV questionnaire was completed by the patient on N/A; I have confirmed that all information answered by patient is correct and no changes since this date.  Review of Systems    No ROS. Medicare Wellness Telephone Visit. Additional risk factors are reflected in social history. Cardiac Risk Factors include: advanced age (>26men, >83 women);dyslipidemia;hypertension;male gender     Objective:    Today's Vitals   06/21/23 0826  BP: 110/64  Pulse: 69  Temp: 98.7 F (37.1 C)  SpO2: 94%  Weight: 164 lb (74.4 kg)  Height: 5' 8.25" (1.734 m)   Body mass index is 24.75 kg/m.  Per patient no change in vitals since last visit, unable to obtain new vitals due to telehealth visit.      06/21/2023    8:23 AM 10/07/2021   11:21 AM 05/12/2021   11:17 AM 10/17/2020    6:10 AM 08/23/2020    8:50 AM 07/26/2020    1:09 PM 02/01/2019   10:26 AM  Advanced Directives  Does Patient Have a Medical Advance Directive? No No No No No No No  Would patient like information on creating a medical advance directive? Yes (MAU/Ambulatory/Procedural Areas - Information given) Yes (MAU/Ambulatory/Procedural Areas - Information given)  No - Patient declined No - Patient declined Yes (MAU/Ambulatory/Procedural Areas - Information given) Yes (ED - Information included in AVS)    Current Medications (verified) Outpatient Encounter Medications as of 06/21/2023  Medication Sig   albuterol (ACCUNEB)  0.63 MG/3ML nebulizer solution Take 3 mLs (0.63 mg total) by nebulization every 6 (six) hours as needed for wheezing.   albuterol (VENTOLIN HFA) 108 (90 Base) MCG/ACT inhaler Inhale 2 puffs into the lungs every 6 (six) hours as needed for wheezing or shortness of breath.   aspirin EC 81 MG tablet Take 81 mg by mouth daily.   atorvastatin (LIPITOR) 80 MG tablet Take 1 tablet by mouth once daily   chlorhexidine (PERIDEX) 0.12 % solution    Cholecalciferol (VITAMIN D) 50 MCG (2000 UT) tablet Take 1 tablet (2,000 Units total) by mouth daily.   clotrimazole (LOTRIMIN) 1 % cream Apply 1 application topically 2 (two) times daily as needed (irritation).   clotrimazole-betamethasone (LOTRISONE) cream Apply 1 Application topically daily. Use as directed twice per day as needed   cyanocobalamin (VITAMIN B12) 1000 MCG tablet Take 1 tablet (1,000 mcg total) by mouth daily.   docusate sodium (COLACE) 100 MG capsule Take 100 mg by mouth daily as needed for mild constipation.   donepezil (ARICEPT) 10 MG tablet Take 1 tablet (10 mg total) by mouth at bedtime.   Ferrous Sulfate 27 MG TABS Take 27 mg by mouth daily as needed (pt prefrence).   fluocinonide ointment (LIDEX) 0.05 % Apply 1 application topically 2 (two) times daily.   fluticasone (CUTIVATE) 0.05 % cream Apply 1 application topically daily as needed (irritation).   Fluticasone-Umeclidin-Vilant (TRELEGY ELLIPTA) 100-62.5-25 MCG/ACT AEPB Inhale 1 puff then rinse mouth, once daily   gabapentin (NEURONTIN) 100 MG capsule Take 1 capsule (100 mg  total) by mouth 3 (three) times daily. (Patient taking differently: Take 100 mg by mouth 3 (three) times daily as needed (pain).)   hydrocortisone 2.5 % cream SMARTSIG:Topical 1-2 Times Daily PRN   ipratropium (ATROVENT) 0.06 % nasal spray Place 2 sprays into both nostrils 3 (three) times daily.   ipratropium-albuterol (DUONEB) 0.5-2.5 (3) MG/3ML SOLN Take 3 mLs by nebulization every 6 (six) hours as needed.    ketoconazole (NIZORAL) 2 % cream Apply 1 Application topically daily.   losartan (COZAAR) 100 MG tablet Take 1 tablet (100 mg total) by mouth daily.   meloxicam (MOBIC) 15 MG tablet 1 tab by mouth once daily as needed (Patient taking differently: Take 15 mg by mouth daily as needed for pain. 1 tab by mouth once daily as needed)   memantine (NAMENDA) 10 MG tablet Take 1 tablet (10 mg total) by mouth 2 (two) times daily.   nitroGLYCERIN (NITROSTAT) 0.4 MG SL tablet Place 1 tablet (0.4 mg total) under the tongue every 5 (five) minutes as needed for chest pain.   Omega-3 Fatty Acids (FISH OIL) 1000 MG CAPS Take 1,000 mg by mouth 2 (two) times daily.   omeprazole (PRILOSEC) 20 MG capsule Take 1 capsule (20 mg total) by mouth 2 (two) times daily. (Patient taking differently: Take 20 mg by mouth 2 (two) times daily as needed (acid reflux).)   pantoprazole (PROTONIX) 40 MG tablet TAKE ONE TABLET BY MOUTH ONCE EVERY DAY TO CONTROL STOMACH ACID   sildenafil (VIAGRA) 100 MG tablet Take 0.5-1 tablets (50-100 mg total) by mouth daily as needed for erectile dysfunction.   Spacer/Aero-Holding Chambers (AEROCHAMBER MV) inhaler Use as instructed   tadalafil (CIALIS) 20 MG tablet Take 20 mg by mouth daily as needed for erectile dysfunction.   terazosin (HYTRIN) 2 MG capsule Take 1 capsule (2 mg total) by mouth at bedtime.   cetirizine (ZYRTEC) 10 MG tablet Take 1 tablet (10 mg total) by mouth daily.   traZODone (DESYREL) 50 MG tablet Take 50 mg by mouth at bedtime as needed for sleep. (Patient not taking: Reported on 06/21/2023)   [DISCONTINUED] amoxicillin-clavulanate (AUGMENTIN) 875-125 MG tablet Take 1 tablet by mouth every 12 (twelve) hours. (Patient not taking: Reported on 06/21/2023)   No facility-administered encounter medications on file as of 06/21/2023.    Allergies (verified) Memantine and Sildenafil   History: Past Medical History:  Diagnosis Date   ABUSE, ALCOHOL, CONTINUOUS 08/08/2007   ALLERGIC  RHINITIS 08/08/2007   ANXIETY 08/08/2007   Carpal tunnel syndrome    COPD 10/03/2009   DEPRESSION 08/08/2007   ERECTILE DYSFUNCTION 08/08/2007   GENITAL HERPES, HX OF 08/08/2007   GERD 08/08/2007   GLUCOSE INTOLERANCE 08/08/2007   HYPERLIPIDEMIA 08/08/2007   HYPERTENSION 08/08/2007   Impaired glucose tolerance 09/26/2011   Insomnia 05/18/2012   PAIN IN SOFT TISSUES OF LIMB 10/02/2009   Sleep apnea    OSA-uses CPAP nightly   Past Surgical History:  Procedure Laterality Date   ACHILLES TENDON REPAIR Bilateral    CARPAL TUNNEL RELEASE Right 08/18/2018   Procedure: RIGHT CARPAL TUNNEL RELEASE;  Surgeon: Betha Loa, MD;  Location: [facility];  Service: Orthopedics;  Laterality: Right;   CARPAL TUNNEL RELEASE Left 11/21/2018   Procedure: LEFT CARPAL TUNNEL RELEASE;  Surgeon: Betha Loa, MD;  Location: [facility];  Service: Orthopedics;  Laterality: Left;  Bier block   COLONOSCOPY     LEFT HEART CATH AND CORONARY ANGIOGRAPHY N/A 10/17/2020   Procedure: LEFT HEART CATH  AND CORONARY ANGIOGRAPHY;  Surgeon: Yvonne Kendall, MD;  Location: [facility];  Service: Cardiovascular;  Laterality: N/A;   Family History  Problem Relation Age of Onset   Colon polyps Brother    Social History   Socioeconomic History   Marital status: Single    Spouse name: Not on file   Number of children: Not on file   Years of education: Not on file   Highest education level: Not on file  Occupational History   Occupation: self employed auto body work  Tobacco Use   Smoking status: Former    Current packs/day: 0.00    Average packs/day: 1.5 packs/day for 35.0 years (52.5 ttl pk-yrs)    Types: Cigarettes    Start date: 09/04/1974    Quit date: 09/04/2009    Years since quitting: 13.8   Smokeless tobacco: Never  Vaping Use   Vaping status: Never Used  Substance and Sexual Activity   Alcohol use: Yes    Alcohol/week: 46.0 - 53.0 standard drinks of alcohol    Types: 28 - 35  Cans of beer, 18 Standard drinks or equivalent per week    Comment: everyday drinker amount varies    Drug use: Yes    Types: Marijuana    Comment: marijuana occassionally   Sexual activity: Yes  Other Topics Concern   Not on file  Social History Narrative   Not on file   Social Determinants of Health   Financial Resource Strain: Low Risk  (06/21/2023)   Overall Financial Resource Strain (CARDIA)    Difficulty of Paying Living Expenses: Not hard at all  Food Insecurity: No Food Insecurity (06/21/2023)   Hunger Vital Sign    Worried About Running Out of Food in the Last Year: Never true    Ran Out of Food in the Last Year: Never true  Transportation Needs: No Transportation Needs (06/21/2023)   PRAPARE - Administrator, Civil Service (Medical): No    Lack of Transportation (Non-Medical): No  Physical Activity: Sufficiently Active (06/21/2023)   Exercise Vital Sign    Days of Exercise per Week: 3 days    Minutes of Exercise per Session: 60 min  Stress: No Stress Concern Present (06/21/2023)   [employer] of Occupational Health - Occupational Stress Questionnaire    Feeling of Stress : Not at all  Social Connections: Moderately Isolated (06/21/2023)   Social Connection and Isolation Panel [NHANES]    Frequency of Communication with Friends and Family: More than three times a week    Frequency of Social Gatherings with Friends and Family: More than three times a week    Attends Religious Services: 1 to 4 times per year    Active Member of [employer] or Organizations: No    Attends Engineer, structural: Never    Marital Status: Divorced    Tobacco Counseling Counseling given: Not Answered   Clinical Intake:  Pre-visit preparation completed: Yes  Pain : No/denies pain     BMI - recorded: 24 Nutritional Status: BMI of 19-24  Normal Nutritional Risks: None Diabetes: No  How often do you need to have someone help you when you read instructions, pamphlets, or  other written materials from your doctor or pharmacy?: 1 - Never What is the last grade level you completed in school?: 16 years  Interpreter Needed?: No  Information entered by :: Elyse Jarvis, CMA   Activities of Daily Living    06/21/2023    8:23 AM  06/18/2023    8:45 AM  In your present state of health, do you have any difficulty performing the following activities:  Hearing? 0 0  Vision? 0 0  Difficulty concentrating or making decisions? 1 1  Walking or climbing stairs? 0 1  Comment  SOB  Dressing or bathing? 0 0  Doing errands, shopping? 0 0  Preparing Food and eating ? N   Using the Toilet? N   In the past six months, have you accidently leaked urine? Y   Do you have problems with loss of bowel control? N   Managing your Medications? N   Managing your Finances? N   Housekeeping or managing your Housekeeping? N     Patient Care Team: Corwin Levins, MD as PCP - General Anne Fu Veverly Fells, MD as PCP - Cardiology (Cardiology) Waymon Budge, MD as Consulting Physician (Pulmonary Disease)  Indicate any recent Medical Services you may have received from other than [facility] providers in the past year (date may be approximate).     Assessment:   This is a routine wellness examination for Ryan Diaz.  Hearing/Vision screen Patient denied any hearing difficulty. No hearing aids. Patient does wear corrective lenses  Dietary issues and exercise activities discussed:     Goals Addressed             This Visit's Progress    Patient Stated       Patient declined health goal at this time.        Depression Screen    06/21/2023    8:22 AM 06/18/2023    8:45 AM 11/24/2022    3:44 PM 07/31/2022   10:14 AM 06/23/2022   10:49 AM 01/28/2022   11:01 AM 01/28/2022   10:30 AM  PHQ 2/9 Scores  PHQ - 2 Score 1 1 0  2 0 2  PHQ- 9 Score  5   7  5   Exception Documentation    Patient refusal       Fall Risk    06/21/2023    8:23 AM 06/18/2023    8:44 AM 11/24/2022    3:44 PM 07/31/2022    10:14 AM 06/23/2022   10:50 AM  Fall Risk   Falls in the past year? 0 0 0 0 0  Number falls in past yr: 0 0 0  0  Injury with Fall? 0 0 0 0 0  Risk for fall due to : No Fall Risks No Fall Risks  No Fall Risks No Fall Risks  Follow up Falls evaluation completed Falls evaluation completed  Falls evaluation completed Falls evaluation completed    MEDICARE RISK AT HOME:  Medicare Risk at Home - 06/21/23 0825     Any stairs in or around the home? No    If so, are there any without handrails? No    Home free of loose throw rugs in walkways, pet beds, electrical cords, etc? Yes    Adequate lighting in your home to reduce risk of falls? Yes    Life alert? No    Use of a cane, walker or w/c? No    Grab bars in the bathroom? Yes    Shower chair or bench in shower? No    Elevated toilet seat or a handicapped toilet? No             TIMED UP AND GO:  Was the test performed?  No    Cognitive Function:    02/01/2019   10:28 AM  12/18/2016   11:57 AM  MMSE - Mini Mental State Exam  Orientation to time 5 5  Orientation to Place 5 5  Registration 3 3  Attention/ Calculation 5 5  Recall 3 3  Language- name 2 objects 2 2  Language- repeat 1 1  Language- follow 3 step command 3 3  Language- read & follow direction 1 1  Write a sentence 1 1  Copy design 1 1  Total score 30 30        06/21/2023    8:24 AM 07/26/2020    1:11 PM  6CIT Screen  What Year? 0 points 0 points  What month? 0 points 0 points  What time? 0 points 0 points  Count back from 20 0 points 0 points  Months in reverse 0 points 0 points  Repeat phrase 4 points 2 points  Total Score 4 points 2 points    Immunizations Immunization History  Administered Date(s) Administered   COVID-19, mRNA, vaccine(Comirnaty)12 years and older 09/01/2022   Fluad Quad(high Dose 65+) 08/04/2019, 07/26/2020, 07/31/2021, 07/31/2022   H1N1 09/16/2008   Influenza Split 08/27/2013, 07/28/2017, 09/16/2018   Influenza Whole 08/17/2005,  09/16/2009   Influenza, High Dose Seasonal PF 08/18/2017   Influenza, Seasonal, Injecte, Preservative Fre 08/12/2010, 09/07/2011, 09/15/2012, 08/29/2013   Influenza,inj,Quad PF,6+ Mos 07/27/2016   Influenza,trivalent, recombinat, inj, PF 08/27/2018   Influenza-Unspecified 07/17/2006, 07/18/2007, 08/16/2008, 09/28/2008, 09/18/2009, 10/04/2014, 10/17/2015, 09/16/2016, 08/23/2017, 08/16/2018, 09/16/2018, 08/04/2019, 07/26/2020, 07/31/2021, 07/17/2022   PFIZER Comirnaty(Gray Top)Covid-19 Tri-Sucrose Vaccine 03/13/2021   PFIZER(Purple Top)SARS-COV-2 Vaccination 12/09/2019, 12/30/2019, 08/13/2020   Pfizer Covid-19 Vaccine Bivalent Booster 38yrs & up 09/10/2021, 03/10/2022   Pneumococcal Conjugate-13 04/10/2014, 06/27/2014   Pneumococcal Polysaccharide-23 08/19/2006, 05/11/2007, 09/15/2012, 03/09/2018   Polio, Unspecified 03/03/1954, 09/01/1954, 06/30/1958   Rsv, Bivalent, Protein Subunit Rsvpref,pf Verdis Frederickson) 09/01/2022   Smallpox 08/05/1964, 04/26/1972   Td 01/01/2017   Tdap 05/11/2007   Tetanus 08/05/1964   Zoster Recombinant(Shingrix) 01/04/2018, 04/04/2018   Zoster, Live 08/19/2006, 01/01/2012    TDAP status: Up to date  Flu Vaccine status: Due, Education has been provided regarding the importance of this vaccine. Advised may receive this vaccine at local pharmacy or Health Dept. Aware to provide a copy of the vaccination record if obtained from local pharmacy or Health Dept. Verbalized acceptance and understanding.  Pneumococcal vaccine status: Up to date  Covid-19 vaccine status: Completed vaccines  Qualifies for Shingles Vaccine? Yes   Zostavax completed No   Shingrix Completed?: Yes  Screening Tests Health Maintenance  Topic Date Due   Lung Cancer Screening  Never done   INFLUENZA VACCINE  06/17/2023   Medicare Annual Wellness (AWV)  06/20/2024   DTaP/Tdap/Td (4 - Td or Tdap) 01/01/2027   Pneumonia Vaccine 44+ Years old  Completed   Hepatitis C Screening  Completed   Zoster  Vaccines- Shingrix  Completed   HPV VACCINES  Aged Out   Colonoscopy  Discontinued   COVID-19 Vaccine  Discontinued    Health Maintenance  Health Maintenance Due  Topic Date Due   Lung Cancer Screening  Never done   INFLUENZA VACCINE  06/17/2023    Colorectal cancer screening: No longer required.   Lung Cancer Screening: (Low Dose CT Chest recommended if Age 43-80 years, 20 pack-year currently smoking OR have quit w/in 15years.) does not qualify.   Lung Cancer Screening Referral: N/A  Additional Screening:  Hepatitis C Screening: does qualify; Completed 02/14/2016  Vision Screening: Recommended annual ophthalmology exams for early detection of glaucoma and other disorders  of the eye. Is the patient up to date with their annual eye exam?  Yes  Who is the provider or what is the name of the office in which the patient attends annual eye exams? [facility] If pt is not established with a provider, would they like to be referred to a provider to establish care? No .   Dental Screening: Recommended annual dental exams for proper oral hygiene   Community Resource Referral / Chronic Care Management: CRR required this visit?  No   CCM required this visit?  No     Plan:     I have personally reviewed and noted the following in the patient's chart:   Medical and social history Use of alcohol, tobacco or illicit drugs  Current medications and supplements including opioid prescriptions. Patient is not currently taking opioid prescriptions. Functional ability and status Nutritional status Physical activity Advanced directives List of other physicians Hospitalizations, surgeries, and ER visits in previous 12 months Vitals Screenings to include cognitive, depression, and falls Referrals and appointments  In addition, I have reviewed and discussed with patient certain preventive protocols, quality metrics, and best practice recommendations. A written personalized care plan for  preventive services as well as general preventive health recommendations were provided to patient.     Marinus Maw, CMA   06/21/2023   After Visit Summary: (MyChart) Due to this being a telephonic visit, the after visit summary with patients personalized plan was offered to patient via MyChart   Nurse Notes: Advanced Directive information sent in mail to address on file.

## 2023-06-24 DIAGNOSIS — G4733 Obstructive sleep apnea (adult) (pediatric): Secondary | ICD-10-CM | POA: Diagnosis not present

## 2023-06-24 DIAGNOSIS — R0683 Snoring: Secondary | ICD-10-CM | POA: Diagnosis not present

## 2023-06-24 DIAGNOSIS — G4700 Insomnia, unspecified: Secondary | ICD-10-CM | POA: Diagnosis not present

## 2023-07-25 DIAGNOSIS — G4733 Obstructive sleep apnea (adult) (pediatric): Secondary | ICD-10-CM | POA: Diagnosis not present

## 2023-07-25 DIAGNOSIS — R0683 Snoring: Secondary | ICD-10-CM | POA: Diagnosis not present

## 2023-07-25 DIAGNOSIS — G4700 Insomnia, unspecified: Secondary | ICD-10-CM | POA: Diagnosis not present

## 2023-08-05 ENCOUNTER — Ambulatory Visit: Payer: PPO | Admitting: Internal Medicine

## 2023-08-10 ENCOUNTER — Telehealth: Payer: Self-pay | Admitting: Pharmacist

## 2023-08-10 NOTE — Telephone Encounter (Signed)
Patient called for Pharmacy Quality Measure Review due to being at risk of failing the adherence measure his statin use. Left voicemail with direct number for call back.  Arbutus Leas, PharmD, BCPS [facility] [phone]

## 2023-08-13 NOTE — Progress Notes (Signed)
Pharmacy Quality Measure Review  This patient is appearing on a report for being at risk of failing the adherence measure for cholesterol (statin) medications this calendar year.   Medication: atorvastatin 80 mg daily Last fill date: 04/13/2023 for 90 day supply  Spoke to patient, he confirms he is taking atorvastatin. He received his last refill from the [facility] therefore it is not showing on the report through insurance. No action needed at this time.   Arbutus Leas, PharmD, BCPS [facility] [phone]

## 2023-08-24 DIAGNOSIS — G4700 Insomnia, unspecified: Secondary | ICD-10-CM | POA: Diagnosis not present

## 2023-08-24 DIAGNOSIS — G4733 Obstructive sleep apnea (adult) (pediatric): Secondary | ICD-10-CM | POA: Diagnosis not present

## 2023-08-24 DIAGNOSIS — R0683 Snoring: Secondary | ICD-10-CM | POA: Diagnosis not present

## 2023-08-30 ENCOUNTER — Telehealth: Payer: Self-pay | Admitting: Internal Medicine

## 2023-08-30 DIAGNOSIS — Z1211 Encounter for screening for malignant neoplasm of colon: Secondary | ICD-10-CM

## 2023-08-30 NOTE — Telephone Encounter (Signed)
Pt called wanting Dr. Jonny Ruiz to put in a order for a Colonoscopy pt stated he do not want to do it at the [facility] in [place]. Pt want to do it some where locally. Please advise.

## 2023-08-30 NOTE — Telephone Encounter (Signed)
Ok this is done thanks

## 2023-09-07 DIAGNOSIS — H43812 Vitreous degeneration, left eye: Secondary | ICD-10-CM | POA: Diagnosis not present

## 2023-09-07 DIAGNOSIS — H1045 Other chronic allergic conjunctivitis: Secondary | ICD-10-CM | POA: Diagnosis not present

## 2023-09-07 DIAGNOSIS — H2513 Age-related nuclear cataract, bilateral: Secondary | ICD-10-CM | POA: Diagnosis not present

## 2023-09-07 DIAGNOSIS — H04123 Dry eye syndrome of bilateral lacrimal glands: Secondary | ICD-10-CM | POA: Diagnosis not present

## 2023-09-07 DIAGNOSIS — R7309 Other abnormal glucose: Secondary | ICD-10-CM | POA: Diagnosis not present

## 2023-09-22 ENCOUNTER — Encounter: Payer: Self-pay | Admitting: Anesthesiology

## 2023-09-30 ENCOUNTER — Telehealth: Payer: Self-pay | Admitting: Internal Medicine

## 2023-09-30 NOTE — Telephone Encounter (Signed)
PT is calling to schedule a colonoscopy. The recall has instruction that it shouldn't be scheduled for screening purposes. Would you recommend the patient having this procedure. Please advise

## 2023-10-04 ENCOUNTER — Encounter: Payer: Self-pay | Admitting: Internal Medicine

## 2023-10-04 NOTE — Telephone Encounter (Signed)
Left a VM to call back and schedule procedure.

## 2023-11-12 ENCOUNTER — Telehealth: Payer: Self-pay

## 2023-11-12 ENCOUNTER — Ambulatory Visit: Payer: PPO

## 2023-11-12 NOTE — Telephone Encounter (Signed)
Left another message that I was trying to reach him for our pre visit

## 2023-11-12 NOTE — Progress Notes (Deleted)
No egg or soy allergy known to patient  No issues known to pt with past sedation with any surgeries or procedures Patient denies ever being told they had issues or difficulty with intubation  No FH of Malignant Hyperthermia Pt is not on diet pills Pt is not on  home 02  Pt is not on blood thinners  Pt denies issues with constipation  No A fib or A flutter Have any cardiac testing pending--*** Pt can ambulate  Pt denies use of chewing tobacco Discussed diabetic I weight loss medication holds Discussed NSAID holds Checked BMI Pt instructed to use Singlecare.com or GoodRx for a price reduction on prep  Patient's chart reviewed by Cathlyn Parsons CNRA prior to previsit and patient appropriate for the [facility].  Pre visit completed and red dot placed by patient's name on their procedure day (on provider's schedule).

## 2023-11-12 NOTE — Telephone Encounter (Signed)
No answer left message that I was trying to contact him for pre visit.

## 2023-11-15 ENCOUNTER — Ambulatory Visit: Payer: Self-pay | Admitting: Internal Medicine

## 2023-11-15 NOTE — Telephone Encounter (Signed)
Copied from CRM [id number]. Topic: Clinical - Pink Word Triage >> Nov 15, 2023  8:26 AM Ryan Diaz H wrote: Reason for Triage: Patient states he's having some dizziness with movement or bending over last 2 weeks. Patient wanted to see provider scheduled him for tomorrow at 10:40 with Dr. Oliver Barre

## 2023-11-15 NOTE — Telephone Encounter (Signed)
Reason for Disposition ?? Third attempt to contact caller AND no contact made. Phone number verified. ? ?Protocols used: No Contact or Duplicate Contact Call-A-AH ? ?

## 2023-11-16 ENCOUNTER — Encounter: Payer: Self-pay | Admitting: Internal Medicine

## 2023-11-16 ENCOUNTER — Ambulatory Visit (INDEPENDENT_AMBULATORY_CARE_PROVIDER_SITE_OTHER): Payer: PPO | Admitting: Internal Medicine

## 2023-11-16 VITALS — BP 118/70 | HR 70 | Temp 98.0°F | Ht 68.25 in | Wt 167.0 lb

## 2023-11-16 DIAGNOSIS — I10 Essential (primary) hypertension: Secondary | ICD-10-CM | POA: Diagnosis not present

## 2023-11-16 DIAGNOSIS — J449 Chronic obstructive pulmonary disease, unspecified: Secondary | ICD-10-CM | POA: Diagnosis not present

## 2023-11-16 DIAGNOSIS — R42 Dizziness and giddiness: Secondary | ICD-10-CM | POA: Diagnosis not present

## 2023-11-16 DIAGNOSIS — J309 Allergic rhinitis, unspecified: Secondary | ICD-10-CM | POA: Diagnosis not present

## 2023-11-16 DIAGNOSIS — F03A4 Unspecified dementia, mild, with anxiety: Secondary | ICD-10-CM

## 2023-11-16 DIAGNOSIS — F0670 Mild neurocognitive disorder due to known physiological condition without behavioral disturbance: Secondary | ICD-10-CM | POA: Insufficient documentation

## 2023-11-16 DIAGNOSIS — R7302 Impaired glucose tolerance (oral): Secondary | ICD-10-CM | POA: Diagnosis not present

## 2023-11-16 DIAGNOSIS — E559 Vitamin D deficiency, unspecified: Secondary | ICD-10-CM | POA: Diagnosis not present

## 2023-11-16 DIAGNOSIS — E530 Riboflavin deficiency: Secondary | ICD-10-CM | POA: Diagnosis not present

## 2023-11-16 DIAGNOSIS — E7800 Pure hypercholesterolemia, unspecified: Secondary | ICD-10-CM

## 2023-11-16 LAB — URINALYSIS, ROUTINE W REFLEX MICROSCOPIC
Bilirubin Urine: NEGATIVE
Hgb urine dipstick: NEGATIVE
Ketones, ur: NEGATIVE
Leukocytes,Ua: NEGATIVE
Nitrite: NEGATIVE
RBC / HPF: NONE SEEN (ref 0–?)
Specific Gravity, Urine: 1.015 (ref 1.000–1.030)
Total Protein, Urine: NEGATIVE
Urine Glucose: NEGATIVE
Urobilinogen, UA: 0.2 (ref 0.0–1.0)
WBC, UA: NONE SEEN (ref 0–?)
pH: 7 (ref 5.0–8.0)

## 2023-11-16 LAB — CBC WITH DIFFERENTIAL/PLATELET
Basophils Absolute: 0.1 10*3/uL (ref 0.0–0.1)
Basophils Relative: 0.8 % (ref 0.0–3.0)
Eosinophils Absolute: 0.3 10*3/uL (ref 0.0–0.7)
Eosinophils Relative: 5.5 % — ABNORMAL HIGH (ref 0.0–5.0)
HCT: 41.9 % (ref 39.0–52.0)
Hemoglobin: 14 g/dL (ref 13.0–17.0)
Lymphocytes Relative: 31.8 % (ref 12.0–46.0)
Lymphs Abs: 2 10*3/uL (ref 0.7–4.0)
MCHC: 33.4 g/dL (ref 30.0–36.0)
MCV: 97.1 fL (ref 78.0–100.0)
Monocytes Absolute: 0.8 10*3/uL (ref 0.1–1.0)
Monocytes Relative: 12.7 % — ABNORMAL HIGH (ref 3.0–12.0)
Neutro Abs: 3.1 10*3/uL (ref 1.4–7.7)
Neutrophils Relative %: 49.2 % (ref 43.0–77.0)
Platelets: 285 10*3/uL (ref 150.0–400.0)
RBC: 4.31 Mil/uL (ref 4.22–5.81)
RDW: 13 % (ref 11.5–15.5)
WBC: 6.3 10*3/uL (ref 4.0–10.5)

## 2023-11-16 LAB — HEPATIC FUNCTION PANEL
ALT: 25 U/L (ref 0–53)
AST: 22 U/L (ref 0–37)
Albumin: 4.5 g/dL (ref 3.5–5.2)
Alkaline Phosphatase: 85 U/L (ref 39–117)
Bilirubin, Direct: 0.1 mg/dL (ref 0.0–0.3)
Total Bilirubin: 0.7 mg/dL (ref 0.2–1.2)
Total Protein: 7.8 g/dL (ref 6.0–8.3)

## 2023-11-16 LAB — BASIC METABOLIC PANEL
BUN: 27 mg/dL — ABNORMAL HIGH (ref 6–23)
CO2: 24 meq/L (ref 19–32)
Calcium: 10 mg/dL (ref 8.4–10.5)
Chloride: 105 meq/L (ref 96–112)
Creatinine, Ser: 1.38 mg/dL (ref 0.40–1.50)
GFR: 49.24 mL/min — ABNORMAL LOW (ref >60.00)
Glucose, Bld: 87 mg/dL (ref 70–99)
Potassium: 5 meq/L (ref 3.5–5.1)
Sodium: 138 meq/L (ref 135–145)

## 2023-11-16 LAB — LIPID PANEL
Cholesterol: 109 mg/dL (ref 0–200)
HDL: 34 mg/dL — ABNORMAL LOW (ref >39.00)
LDL Cholesterol: 59 mg/dL (ref 0–99)
NonHDL: 74.92
Total CHOL/HDL Ratio: 3
Triglycerides: 81 mg/dL (ref 0.0–149.0)
VLDL: 16.2 mg/dL (ref 0.0–40.0)

## 2023-11-16 LAB — HEMOGLOBIN A1C: Hgb A1c MFr Bld: 6 % (ref 4.6–6.5)

## 2023-11-16 LAB — VITAMIN D 25 HYDROXY (VIT D DEFICIENCY, FRACTURES): VITD: 74.44 ng/mL (ref 30.00–100.00)

## 2023-11-16 NOTE — Assessment & Plan Note (Signed)
 Lab Results  Component Value Date   LDLCALC 59 11/16/2023   Stable, pt to continue current statin lipitor 80 mg qd

## 2023-11-16 NOTE — Assessment & Plan Note (Signed)
Last vitamin D Lab Results  Component Value Date   VD25OH 74.44 11/16/2023   Stable, cont oral replacement

## 2023-11-16 NOTE — Assessment & Plan Note (Signed)
Mild to mod, for resstart zyrtec 10 every day prn,  to f/u any worsening symptoms or concerns

## 2023-11-16 NOTE — Assessment & Plan Note (Signed)
Stable, to continue inhaler prn

## 2023-11-16 NOTE — Progress Notes (Signed)
 Patient ID: Ryan Diaz, male   DOB: [birth year], 77 y.o.   MRN: [id number]        Chief Complaint: follow up dizziness on bending then standing up again, hld, copd       HPI:  Ryan Diaz is a 77 y.o. male here with c/o above, worse to do this quickly last for several seconds wtihout recurrence;  Pt denies chest pain, increased sob or doe, wheezing, orthopnea, PND, increased LE swelling, palpitations,  or syncope.  Pt denies polydipsia, polyuria, or new focal neuro s/s.    Pt denies fever, wt loss, night sweats, loss of appetite, or other constitutional symptoms  Does have several wks ongoing nasal allergy symptoms with clearish congestion, itch and sneezing, without fever, pain, ST, cough, swelling or wheezing.  Dementia overall stable symptomatically, and not assoc with behavioral changes such as hallucinations, paranoia, or agitation.        Wt Readings from Last 3 Encounters:  11/16/23 167 lb (75.8 kg)  06/21/23 164 lb (74.4 kg)  06/18/23 164 lb (74.4 kg)   BP Readings from Last 3 Encounters:  11/16/23 118/70  06/21/23 110/64  06/18/23 110/64         Past Medical History:  Diagnosis Date   ABUSE, ALCOHOL, CONTINUOUS 08/08/2007   ALLERGIC RHINITIS 08/08/2007   ANXIETY 08/08/2007   Carpal tunnel syndrome    COPD 10/03/2009   DEPRESSION 08/08/2007   ERECTILE DYSFUNCTION 08/08/2007   GENITAL HERPES, HX OF 08/08/2007   GERD 08/08/2007   GLUCOSE INTOLERANCE 08/08/2007   HYPERLIPIDEMIA 08/08/2007   HYPERTENSION 08/08/2007   Impaired glucose tolerance 09/26/2011   Insomnia 05/18/2012   PAIN IN SOFT TISSUES OF LIMB 10/02/2009   Sleep apnea    OSA-uses CPAP nightly   Past Surgical History:  Procedure Laterality Date   ACHILLES TENDON REPAIR Bilateral    CARPAL TUNNEL RELEASE Right 08/18/2018   Procedure: RIGHT CARPAL TUNNEL RELEASE;  Surgeon: Murrell Drivers, MD;  Location: [facility];  Service: Orthopedics;  Laterality: Right;   CARPAL TUNNEL RELEASE Left 11/21/2018    Procedure: LEFT CARPAL TUNNEL RELEASE;  Surgeon: Murrell Drivers, MD;  Location: [facility];  Service: Orthopedics;  Laterality: Left;  Bier block   COLONOSCOPY     LEFT HEART CATH AND CORONARY ANGIOGRAPHY N/A 10/17/2020   Procedure: LEFT HEART CATH AND CORONARY ANGIOGRAPHY;  Surgeon: Mady Bruckner, MD;  Location: [facility];  Service: Cardiovascular;  Laterality: N/A;    reports that he quit smoking about 14 years ago. His smoking use included cigarettes. He started smoking about 49 years ago. He has a 52.5 pack-year smoking history. He has never used smokeless tobacco. He reports current alcohol use of about 46.0 - 53.0 standard drinks of alcohol per week. He reports current drug use. Drug: Marijuana. family history includes Colon polyps in his brother. Allergies  Allergen Reactions   Memantine  Other (See Comments)   Sildenafil  Palpitations   Current Outpatient Medications on File Prior to Visit  Medication Sig Dispense Refill   albuterol  (ACCUNEB ) 0.63 MG/3ML nebulizer solution Take 3 mLs (0.63 mg total) by nebulization every 6 (six) hours as needed for wheezing. 75 mL 12   albuterol  (VENTOLIN  HFA) 108 (90 Base) MCG/ACT inhaler Inhale 2 puffs into the lungs every 6 (six) hours as needed for wheezing or shortness of breath. 18 g 12   aspirin  EC 81 MG tablet Take 81 mg by mouth daily.     atorvastatin  (LIPITOR) 80  MG tablet Take 1 tablet by mouth once daily 90 tablet 2   chlorhexidine  (PERIDEX ) 0.12 % solution      Cholecalciferol (VITAMIN D ) 50 MCG (2000 UT) tablet Take 1 tablet (2,000 Units total) by mouth daily. 90 tablet 3   clotrimazole  (LOTRIMIN ) 1 % cream Apply 1 application topically 2 (two) times daily as needed (irritation).     clotrimazole -betamethasone  (LOTRISONE ) cream Apply 1 Application topically daily. Use as directed twice per day as needed 30 g 0   cyanocobalamin  (VITAMIN B12) 1000 MCG tablet Take 1 tablet (1,000 mcg total) by mouth daily. 90 tablet 3    docusate sodium (COLACE) 100 MG capsule Take 100 mg by mouth daily as needed for mild constipation.     donepezil  (ARICEPT ) 10 MG tablet Take 1 tablet (10 mg total) by mouth at bedtime. 90 tablet 3   Ferrous Sulfate 27 MG TABS Take 27 mg by mouth daily as needed (pt prefrence).     fluocinonide  ointment (LIDEX ) 0.05 % Apply 1 application topically 2 (two) times daily. 30 g 2   fluticasone  (CUTIVATE ) 0.05 % cream Apply 1 application topically daily as needed (irritation).     Fluticasone -Umeclidin-Vilant (TRELEGY ELLIPTA ) 100-62.5-25 MCG/ACT AEPB Inhale 1 puff then rinse mouth, once daily 60 each 12   gabapentin  (NEURONTIN ) 100 MG capsule Take 1 capsule (100 mg total) by mouth 3 (three) times daily. (Patient taking differently: Take 100 mg by mouth 3 (three) times daily as needed (pain).) 90 capsule 5   hydrocortisone 2.5 % cream SMARTSIG:Topical 1-2 Times Daily PRN     ipratropium (ATROVENT ) 0.06 % nasal spray Place 2 sprays into both nostrils 3 (three) times daily. 15 mL 5   ipratropium-albuterol  (DUONEB) 0.5-2.5 (3) MG/3ML SOLN Take 3 mLs by nebulization every 6 (six) hours as needed. 360 mL 12   ketoconazole  (NIZORAL ) 2 % cream Apply 1 Application topically daily. 15 g 1   losartan  (COZAAR ) 100 MG tablet Take 1 tablet (100 mg total) by mouth daily. 90 tablet 3   meloxicam  (MOBIC ) 15 MG tablet 1 tab by mouth once daily as needed (Patient taking differently: Take 15 mg by mouth daily as needed for pain. 1 tab by mouth once daily as needed) 90 tablet 1   memantine  (NAMENDA ) 10 MG tablet Take 1 tablet (10 mg total) by mouth 2 (two) times daily. 180 tablet 3   nitroGLYCERIN  (NITROSTAT ) 0.4 MG SL tablet Place 1 tablet (0.4 mg total) under the tongue every 5 (five) minutes as needed for chest pain. 25 tablet 4   Omega-3 Fatty Acids (FISH OIL) 1000 MG CAPS Take 1,000 mg by mouth 2 (two) times daily.     omeprazole  (PRILOSEC) 20 MG capsule Take 1 capsule (20 mg total) by mouth 2 (two) times daily.  (Patient taking differently: Take 20 mg by mouth 2 (two) times daily as needed (acid reflux).) 90 capsule 3   pantoprazole (PROTONIX) 40 MG tablet TAKE ONE TABLET BY MOUTH ONCE EVERY DAY TO CONTROL STOMACH ACID     sildenafil  (VIAGRA ) 100 MG tablet Take 0.5-1 tablets (50-100 mg total) by mouth daily as needed for erectile dysfunction. 5 tablet 11   Spacer/Aero-Holding Chambers (AEROCHAMBER MV) inhaler Use as instructed 1 each 0   tadalafil (CIALIS) 20 MG tablet Take 20 mg by mouth daily as needed for erectile dysfunction.     terazosin  (HYTRIN ) 2 MG capsule Take 1 capsule (2 mg total) by mouth at bedtime. 90 capsule 3   traZODone (DESYREL) 50  MG tablet Take 50 mg by mouth at bedtime as needed for sleep.     cetirizine  (ZYRTEC ) 10 MG tablet Take 1 tablet (10 mg total) by mouth daily. 90 tablet 3   No current facility-administered medications on file prior to visit.        ROS:  All others reviewed and negative.  Objective        PE:  BP 118/70 (BP Location: Left Arm, Patient Position: Sitting, Cuff Size: Normal)   Pulse 70   Temp 98 F (36.7 C) (Oral)   Ht 5' 8.25 (1.734 m)   Wt 167 lb (75.8 kg)   SpO2 99%   BMI 25.21 kg/m                 Constitutional: Pt appears in NAD               HENT: Head: NCAT.                Right Ear: External ear normal.                 Left Ear: External ear normal.                Eyes: . Pupils are equal, round, and reactive to light. Conjunctivae and EOM are normal               Nose: without d/c or deformity               Neck: Neck supple. Gross normal ROM               Cardiovascular: Normal rate and regular rhythm.                 Pulmonary/Chest: Effort normal and breath sounds without rales or wheezing.                Abd:  Soft, NT, ND, + BS, no organomegaly               Neurological: Pt is alert. At baseline orientation, motor grossly intact               Skin: Skin is warm. No rashes, no other new lesions, LE edema - none                Psychiatric: Pt behavior is normal without agitation   Micro: none  Cardiac tracings I have personally interpreted today:  none  Pertinent Radiological findings (summarize): none   Lab Results  Component Value Date   WBC 6.3 11/16/2023   HGB 14.0 11/16/2023   HCT 41.9 11/16/2023   PLT 285.0 11/16/2023   GLUCOSE 87 11/16/2023   CHOL 109 11/16/2023   TRIG 81.0 11/16/2023   HDL 34.00 (L) 11/16/2023   LDLDIRECT 96.0 05/13/2021   LDLCALC 59 11/16/2023   ALT 25 11/16/2023   AST 22 11/16/2023   NA 138 11/16/2023   K 5.0 11/16/2023   CL 105 11/16/2023   CREATININE 1.38 11/16/2023   BUN 27 (H) 11/16/2023   CO2 24 11/16/2023   TSH 1.07 11/24/2022   PSA 1.57 01/28/2022   HGBA1C 6.0 11/16/2023   Assessment/Plan:  Ryan Diaz is a 77 y.o. Black or African American [2] male with  has a past medical history of ABUSE, ALCOHOL, CONTINUOUS (08/08/2007), ALLERGIC RHINITIS (08/08/2007), ANXIETY (08/08/2007), Carpal tunnel syndrome, COPD (10/03/2009), DEPRESSION (08/08/2007), ERECTILE DYSFUNCTION (08/08/2007), GENITAL HERPES, HX OF (08/08/2007), GERD (08/08/2007), GLUCOSE INTOLERANCE (08/08/2007), HYPERLIPIDEMIA (08/08/2007),  HYPERTENSION (08/08/2007), Impaired glucose tolerance (09/26/2011), Insomnia (05/18/2012), PAIN IN SOFT TISSUES OF LIMB (10/02/2009), and Sleep apnea.  HLD (hyperlipidemia) Lab Results  Component Value Date   LDLCALC 59 11/16/2023   Stable, pt to continue current statin lipitor 80 mg qd   Essential hypertension BP Readings from Last 3 Encounters:  11/16/23 118/70  06/21/23 110/64  06/18/23 110/64   Stable, pt to continue medical treatment [place] 100 qd   Impaired glucose tolerance Lab Results  Component Value Date   HGBA1C 6.0 11/16/2023   Stable, pt to continue current medical treatment   - diet, wt control   Vitamin D  deficiency Last vitamin D  Lab Results  Component Value Date   VD25OH 74.44 11/16/2023   Stable, cont oral replacement   Vitamin B2  deficiency Lab Results  Component Value Date   VITAMINB12 557 06/18/2023   Stable, cont oral replacement - b12 1000 mcg qd   COPD mixed type (HCC) Stable, to continue inhaler prn  Allergic rhinitis Mild to mod, for resstart zyrtec  10 every day prn,  to f/u any worsening symptoms or concerns  Dementia (HCC) Stable overall ,continue aricept  10 qd  Dizziness Pt with symptoms only with bending forward at the waist then stsanding up again, most likely physiologic, but now for labs as ordered including cbc  Followup: Return in about 5 weeks (around 12/22/2023).  Lynwood Rush, MD 11/16/2023 10:08 PM [facility]

## 2023-11-16 NOTE — Assessment & Plan Note (Signed)
Pt with symptoms only with bending forward at the waist then stsanding up again, most likely physiologic, but now for labs as ordered including cbc

## 2023-11-16 NOTE — Assessment & Plan Note (Signed)
Stable overall ,continue aricept 10 qd

## 2023-11-16 NOTE — Assessment & Plan Note (Signed)
Lab Results  Component Value Date   VITAMINB12 557 06/18/2023   Stable, cont oral replacement - b12 1000 mcg qd

## 2023-11-16 NOTE — Assessment & Plan Note (Signed)
Lab Results  Component Value Date   HGBA1C 6.0 11/16/2023   Stable, pt to continue current medical treatment   - diet, wt control

## 2023-11-16 NOTE — Patient Instructions (Addendum)
 Please continue all other medications as before, and refills have been done if requested.  Please have the pharmacy call with any other refills you may need.  Please continue your efforts at being more active, low cholesterol diet, and weight control.  Please keep your appointments with your specialists as you may have planned  Please work on staying hydrated  Please go to the LAB at the blood drawing area for the tests to be done  You will be contacted by phone if any changes need to be made immediately.  Otherwise, you will receive a letter about your results with an explanation, but please check with MyChart first.  Please make an Appointment to return in Dec 22 2023, or sooner if needed

## 2023-11-16 NOTE — Progress Notes (Signed)
The test results show that your current treatment is OK, as the tests are stable.  Please continue the same plan.  There is no other need for change of treatment or further evaluation based on these results, at this time.  thanks 

## 2023-11-16 NOTE — Assessment & Plan Note (Signed)
 BP Readings from Last 3 Encounters:  11/16/23 118/70  06/21/23 110/64  06/18/23 110/64   Stable, pt to continue medical treatment [place] 100 qd

## 2023-11-25 ENCOUNTER — Ambulatory Visit (AMBULATORY_SURGERY_CENTER): Payer: PPO

## 2023-11-25 VITALS — Ht 68.0 in | Wt 166.0 lb

## 2023-11-25 DIAGNOSIS — Z1211 Encounter for screening for malignant neoplasm of colon: Secondary | ICD-10-CM

## 2023-11-25 MED ORDER — NA SULFATE-K SULFATE-MG SULF 17.5-3.13-1.6 GM/177ML PO SOLN: 1.0000 | Freq: Once | ORAL | 0 refills | Status: AC

## 2023-11-25 NOTE — Progress Notes (Signed)
 Pre visit completed via phone call; Patient verified name, DOB, and address; No egg or soy allergy known to patient;  No issues known to pt with past sedation with any surgeries or procedures; Patient denies ever being told they had issues or difficulty with intubation;  No FH of Malignant Hyperthermia; Pt is not on diet pills; Pt is not on home 02;  Pt is not on blood thinners;  Pt denies issues with constipation;  No A fib or A flutter; Have any cardiac testing pending--NO Insurance verified during PV appt--- HTA Medicare Pt can ambulate without assistance;  Pt denies use of chewing tobacco Discussed diabetic/weight loss medication holds; Discussed NSAID holds; Checked BMI to be less than 50; Pt instructed to use [employer] or GoodRx for a price reduction on prep;  Patient's chart reviewed by Norleen Schillings CNRA prior to previsit and patient appropriate for the [facility]; Pre visit completed and red dot placed by patient's name on their procedure day (on provider's schedule);   Instructions printed and placed on 2nd floor with receptionist for patient to pick up per his request;

## 2023-12-06 ENCOUNTER — Telehealth: Payer: Self-pay | Admitting: Internal Medicine

## 2023-12-06 NOTE — Telephone Encounter (Signed)
Patient reports chills and congestions. Taking OTC cold medications. No fever.Moved his procedure date to 12/15/23. Patient agrees to this plan.

## 2023-12-06 NOTE — Telephone Encounter (Signed)
Patient has a procedure scheduled with Dr. Leonides Schanz 1/22 @ 9.  He has a cold and is congested, but feels like it's starting to break up.  He wants to know if he should proceed with his colonoscopy.  Please call patient and advise.  Thank you.

## 2023-12-08 ENCOUNTER — Encounter: Payer: PPO | Admitting: Internal Medicine

## 2023-12-15 ENCOUNTER — Encounter: Payer: PPO | Admitting: Internal Medicine

## 2023-12-22 ENCOUNTER — Ambulatory Visit (INDEPENDENT_AMBULATORY_CARE_PROVIDER_SITE_OTHER): Payer: PPO | Admitting: Internal Medicine

## 2023-12-22 ENCOUNTER — Encounter: Payer: Self-pay | Admitting: Internal Medicine

## 2023-12-22 VITALS — BP 120/78 | HR 65 | Temp 97.7°F | Ht 68.0 in | Wt 169.0 lb

## 2023-12-22 DIAGNOSIS — E559 Vitamin D deficiency, unspecified: Secondary | ICD-10-CM

## 2023-12-22 DIAGNOSIS — E530 Riboflavin deficiency: Secondary | ICD-10-CM

## 2023-12-22 DIAGNOSIS — G8929 Other chronic pain: Secondary | ICD-10-CM | POA: Diagnosis not present

## 2023-12-22 DIAGNOSIS — E7800 Pure hypercholesterolemia, unspecified: Secondary | ICD-10-CM

## 2023-12-22 DIAGNOSIS — Z0000 Encounter for general adult medical examination without abnormal findings: Secondary | ICD-10-CM

## 2023-12-22 DIAGNOSIS — R7302 Impaired glucose tolerance (oral): Secondary | ICD-10-CM | POA: Diagnosis not present

## 2023-12-22 DIAGNOSIS — M25511 Pain in right shoulder: Secondary | ICD-10-CM

## 2023-12-22 DIAGNOSIS — Z0001 Encounter for general adult medical examination with abnormal findings: Secondary | ICD-10-CM

## 2023-12-22 NOTE — Patient Instructions (Signed)
 Please continue all other medications as before, and refills have been done if requested.  Please have the pharmacy call with any other refills you may need.  Please continue your efforts at being more active, low cholesterol diet, and weight control.  You are otherwise up to date with prevention measures today.  Please keep your appointments with your specialists as you may have planned  You will be contacted regarding the referral for: orthopedic for the right shoulder  Please make an Appointment to return in 6 months, or sooner if needed, also with Lab Appointment for testing done 3-5 days before at the FIRST FLOOR Lab (so this is for TWO appointments - please see the scheduling desk as you leave)

## 2023-12-22 NOTE — Progress Notes (Signed)
 Patient ID: Ryan Diaz, male   DOB: 30-Apr-1946, 78 y.o.   MRN: [id number]         Chief Complaint:: wellness exam and right shoulder pain, hyperglycemia, low b12 and vit d       HPI:  Ryan Diaz is a 78 y.o. male here for wellness exam; declines LDCT screening referral o/w up to date                        Also has 2 mo worsening right shoulder pain similar a few years ago when cortisone helped, has reduced ROM, asking for ortho referral.  Pt denies chest pain, increased sob or doe, wheezing, orthopnea, PND, increased LE swelling, palpitations, dizziness or syncope   Pt denies polydipsia, polyuria, or new focal neuro s/s.    Pt denies fever, wt loss, night sweats, loss of appetite, or other constitutional symptoms     Wt Readings from Last 3 Encounters:  12/22/23 169 lb (76.7 kg)  11/25/23 166 lb (75.3 kg)  11/16/23 167 lb (75.8 kg)   BP Readings from Last 3 Encounters:  12/22/23 120/78  11/16/23 118/70  06/21/23 110/64   Immunization History  Administered Date(s) Administered   Fluad Quad(high Dose 65+) 08/04/2019, 07/26/2020, 07/31/2021, 07/31/2022   H1N1 09/16/2008   Influenza Split 08/27/2013, 07/28/2017, 09/16/2018   Influenza Whole 08/17/2005, 09/16/2009   Influenza, High Dose Seasonal PF 08/18/2017, 07/22/2023   Influenza, Seasonal, Injecte, Preservative Fre 08/12/2010, 09/07/2011, 09/15/2012, 08/29/2013   Influenza,inj,Quad PF,6+ Mos 07/27/2016   Influenza,trivalent, recombinat, inj, PF 08/27/2018   Influenza-Unspecified 07/17/2006, 07/18/2007, 08/16/2008, 09/28/2008, 09/18/2009, 10/04/2014, 10/17/2015, 09/16/2016, 08/23/2017, 08/16/2018, 09/16/2018, 08/04/2019, 07/26/2020, 07/31/2021, 07/17/2022   PFIZER Comirnaty(Gray Top)Covid-19 Tri-Sucrose Vaccine 03/13/2021   PFIZER(Purple Top)SARS-COV-2 Vaccination 12/09/2019, 12/30/2019, 08/13/2020   Pfizer Covid-19 Vaccine Bivalent Booster 74yrs & up 09/10/2021, 03/10/2022   Pfizer(Comirnaty)Fall Seasonal Vaccine 12 years and  older 09/01/2022   Pneumococcal Conjugate-13 04/10/2014, 06/27/2014   Pneumococcal Polysaccharide-23 08/19/2006, 05/11/2007, 09/15/2012, 03/09/2018   Polio, Unspecified 03/03/1954, 09/01/1954, 06/30/1958   Rsv, Bivalent, Protein Subunit Rsvpref,pf Marlow) 09/01/2022   Smallpox 08/05/1964, 04/26/1972   Td 01/01/2017   Tdap 05/11/2007   Tetanus 08/05/1964   Zoster Recombinant(Shingrix) 01/04/2018, 04/04/2018   Zoster, Live 08/19/2006, 01/01/2012   Health Maintenance Due  Topic Date Due   Lung Cancer Screening  Never done      Past Medical History:  Diagnosis Date   ABUSE, ALCOHOL, CONTINUOUS 08/08/2007   ALLERGIC RHINITIS 08/08/2007   ANXIETY 08/08/2007   Carpal tunnel syndrome    COPD 10/03/2009   DEPRESSION 08/08/2007   ERECTILE DYSFUNCTION 08/08/2007   GENITAL HERPES, HX OF 08/08/2007   GERD 08/08/2007   GLUCOSE INTOLERANCE 08/08/2007   HYPERLIPIDEMIA 08/08/2007   HYPERTENSION 08/08/2007   Impaired glucose tolerance 09/26/2011   Insomnia 05/18/2012   PAIN IN SOFT TISSUES OF LIMB 10/02/2009   Sleep apnea    OSA-uses CPAP nightly   Past Surgical History:  Procedure Laterality Date   ACHILLES TENDON REPAIR Bilateral    CARPAL TUNNEL RELEASE Right 08/18/2018   Procedure: RIGHT CARPAL TUNNEL RELEASE;  Surgeon: Murrell Drivers, MD;  Location: [facility];  Service: Orthopedics;  Laterality: Right;   CARPAL TUNNEL RELEASE Left 11/21/2018   Procedure: LEFT CARPAL TUNNEL RELEASE;  Surgeon: Murrell Drivers, MD;  Location: [facility];  Service: Orthopedics;  Laterality: Left;  Bier block   COLONOSCOPY  12/2011   Kaplan-MAC-moviprep(exc)-tics/int hems/normal-10 yr recall   LEFT HEART CATH  AND CORONARY ANGIOGRAPHY N/A 10/17/2020   Procedure: LEFT HEART CATH AND CORONARY ANGIOGRAPHY;  Surgeon: Mady Bruckner, MD;  Location: [facility];  Service: Cardiovascular;  Laterality: N/A;    reports that he quit smoking about 14 years ago. His smoking use included  cigarettes. He started smoking about 49 years ago. He has a 52.5 pack-year smoking history. He has never used smokeless tobacco. He reports that he does not currently use alcohol. He reports current drug use. Frequency: 1.00 time per week. Drug: Marijuana. family history includes Colon polyps (age of onset: 72) in his brother. Allergies  Allergen Reactions   Memantine  Other (See Comments)   Sildenafil  Palpitations   Current Outpatient Medications on File Prior to Visit  Medication Sig Dispense Refill   albuterol  (VENTOLIN  HFA) 108 (90 Base) MCG/ACT inhaler Inhale 2 puffs into the lungs every 6 (six) hours as needed for wheezing or shortness of breath. 18 g 12   aspirin  EC 81 MG tablet Take 81 mg by mouth daily.     atorvastatin  (LIPITOR) 80 MG tablet Take 1 tablet by mouth once daily 90 tablet 2   Cholecalciferol (VITAMIN D ) 50 MCG (2000 UT) tablet Take 1 tablet (2,000 Units total) by mouth daily. 90 tablet 3   clotrimazole  (LOTRIMIN ) 1 % cream Apply 1 application topically 2 (two) times daily as needed (irritation).     clotrimazole -betamethasone  (LOTRISONE ) cream Apply 1 Application topically daily. Use as directed twice per day as needed 30 g 0   cromolyn (OPTICROM) 4 % ophthalmic solution Place 1 drop into both eyes 4 (four) times daily as needed.     cyanocobalamin  (VITAMIN B12) 1000 MCG tablet Take 1 tablet (1,000 mcg total) by mouth daily. 90 tablet 3   docusate sodium (COLACE) 100 MG capsule Take 100 mg by mouth daily as needed for mild constipation.     Fluticasone -Umeclidin-Vilant (TRELEGY ELLIPTA ) 100-62.5-25 MCG/ACT AEPB Inhale 1 puff then rinse mouth, once daily 60 each 12   hydrocortisone 2.5 % cream SMARTSIG:Topical 1-2 Times Daily PRN     losartan  (COZAAR ) 100 MG tablet Take 1 tablet (100 mg total) by mouth daily. 90 tablet 3   Omega-3 Fatty Acids (FISH OIL) 1000 MG CAPS Take 1,000 mg by mouth 2 (two) times daily.     pantoprazole (PROTONIX) 40 MG tablet TAKE ONE TABLET BY MOUTH  ONCE EVERY DAY TO CONTROL STOMACH ACID     Spacer/Aero-Holding Chambers (AEROCHAMBER MV) inhaler Use as instructed 1 each 0   terazosin  (HYTRIN ) 2 MG capsule Take 1 capsule (2 mg total) by mouth at bedtime. 90 capsule 3   traZODone (DESYREL) 50 MG tablet Take 50 mg by mouth at bedtime as needed for sleep.     albuterol  (ACCUNEB ) 0.63 MG/3ML nebulizer solution Take 3 mLs (0.63 mg total) by nebulization every 6 (six) hours as needed for wheezing. (Patient not taking: Reported on 12/22/2023) 75 mL 12   Ferrous Sulfate 27 MG TABS Take 27 mg by mouth daily as needed (pt prefrence). (Patient not taking: Reported on 12/22/2023)     gabapentin  (NEURONTIN ) 100 MG capsule Take 1 capsule (100 mg total) by mouth 3 (three) times daily. (Patient not taking: Reported on 12/22/2023) 90 capsule 5   ipratropium (ATROVENT ) 0.06 % nasal spray Place 2 sprays into both nostrils 3 (three) times daily. (Patient not taking: Reported on 12/22/2023) 15 mL 5   meloxicam  (MOBIC ) 15 MG tablet 1 tab by mouth once daily as needed (Patient not taking: Reported on  11/25/2023) 90 tablet 1   nitroGLYCERIN  (NITROSTAT ) 0.4 MG SL tablet Place 1 tablet (0.4 mg total) under the tongue every 5 (five) minutes as needed for chest pain. (Patient not taking: Reported on 12/22/2023) 25 tablet 4   No current facility-administered medications on file prior to visit.        ROS:  All others reviewed and negative.  Objective        PE:  BP 120/78 (BP Location: Left Arm, Patient Position: Sitting, Cuff Size: Normal)   Pulse 65   Temp 97.7 F (36.5 C) (Oral)   Ht 5' 8 (1.727 m)   Wt 169 lb (76.7 kg)   SpO2 99%   BMI 25.70 kg/m                 Constitutional: Pt appears in NAD               HENT: Head: NCAT.                Right Ear: External ear normal.                 Left Ear: External ear normal.                Eyes: . Pupils are equal, round, and reactive to light. Conjunctivae and EOM are normal               Nose: without d/c or deformity                Neck: Neck supple. Gross normal ROM               Cardiovascular: Normal rate and regular rhythm.                 Pulmonary/Chest: Effort normal and breath sounds without rales or wheezing.                Abd:  Soft, NT, ND, + BS, no organomegaly               Neurological: Pt is alert. At baseline orientation, motor grossly intact               Skin: Skin is warm. No rashes, no other new lesions, LE edema - none               Psychiatric: Pt behavior is normal without agitation   Micro: none  Cardiac tracings I have personally interpreted today:  none  Pertinent Radiological findings (summarize): none   Lab Results  Component Value Date   WBC 6.3 11/16/2023   HGB 14.0 11/16/2023   HCT 41.9 11/16/2023   PLT 285.0 11/16/2023   GLUCOSE 87 11/16/2023   CHOL 109 11/16/2023   TRIG 81.0 11/16/2023   HDL 34.00 (L) 11/16/2023   LDLDIRECT 96.0 05/13/2021   LDLCALC 59 11/16/2023   ALT 25 11/16/2023   AST 22 11/16/2023   NA 138 11/16/2023   K 5.0 11/16/2023   CL 105 11/16/2023   CREATININE 1.38 11/16/2023   BUN 27 (H) 11/16/2023   CO2 24 11/16/2023   TSH 1.07 11/24/2022   PSA 1.57 01/28/2022   HGBA1C 6.0 11/16/2023   Assessment/Plan:  ELMOND POEHLMAN is a 78 y.o. Black or African American [2] male with  has a past medical history of ABUSE, ALCOHOL, CONTINUOUS (08/08/2007), ALLERGIC RHINITIS (08/08/2007), ANXIETY (08/08/2007), Carpal tunnel syndrome, COPD (10/03/2009), DEPRESSION (08/08/2007), ERECTILE DYSFUNCTION (08/08/2007), GENITAL HERPES, HX OF (08/08/2007), GERD (08/08/2007),  GLUCOSE INTOLERANCE (08/08/2007), HYPERLIPIDEMIA (08/08/2007), HYPERTENSION (08/08/2007), Impaired glucose tolerance (09/26/2011), Insomnia (05/18/2012), PAIN IN SOFT TISSUES OF LIMB (10/02/2009), and Sleep apnea.  Encounter for well adult exam with abnormal findings Age and sex appropriate education and counseling updated with regular exercise and diet Referrals for preventative services - declines LDCT  screening Immunizations addressed - none needed Smoking counseling  - none needed Evidence for depression or other mood disorder - none significant Most recent labs reviewed. I have personally reviewed and have noted: 1) the patient's medical and social history 2) The patient's current medications and supplements 3) The patient's height, weight, and BMI have been recorded in the chart   Chronic right shoulder pain With more acute on chronic recent, for referral ortho, likely needs repeat cortisone  HLD (hyperlipidemia) Lab Results  Component Value Date   LDLCALC 59 11/16/2023   Stable, pt to continue current statin lipitor 80 qd   Impaired glucose tolerance Lab Results  Component Value Date   HGBA1C 6.0 11/16/2023   Stable, pt to continue current medical treatment  - diet, wt control   Vitamin B2 deficiency Lab Results  Component Value Date   VITAMINB12 557 06/18/2023   Stable, cont oral replacement - b12 1000 mcg qd   Vitamin D  deficiency Last vitamin D  Lab Results  Component Value Date   VD25OH 74.44 11/16/2023   Stable, cont oral replacement  Followup: Return in about 6 months (around 06/20/2024).  Lynwood Rush, MD 12/26/2023 3:48 PM [facility]

## 2023-12-26 ENCOUNTER — Encounter: Payer: Self-pay | Admitting: Internal Medicine

## 2023-12-26 NOTE — Assessment & Plan Note (Signed)
 Lab Results  Component Value Date   HGBA1C 6.0 11/16/2023   Stable, pt to continue current medical treatment   - diet, wt control

## 2023-12-26 NOTE — Assessment & Plan Note (Signed)
Lab Results  Component Value Date   VITAMINB12 557 06/18/2023   Stable, cont oral replacement - b12 1000 mcg qd

## 2023-12-26 NOTE — Assessment & Plan Note (Signed)
 Last vitamin D Lab Results  Component Value Date   VD25OH 74.44 11/16/2023   Stable, cont oral replacement

## 2023-12-26 NOTE — Assessment & Plan Note (Signed)
 Age and sex appropriate education and counseling updated with regular exercise and diet Referrals for preventative services - declines LDCT screening Immunizations addressed - none needed Smoking counseling  - none needed Evidence for depression or other mood disorder - none significant Most recent labs reviewed. I have personally reviewed and have noted: 1) the patient's medical and social history 2) The patient's current medications and supplements 3) The patient's height, weight, and BMI have been recorded in the chart

## 2023-12-26 NOTE — Assessment & Plan Note (Signed)
 With more acute on chronic recent, for referral ortho, likely needs repeat cortisone

## 2023-12-26 NOTE — Assessment & Plan Note (Signed)
 Lab Results  Component Value Date   LDLCALC 59 11/16/2023   Stable, pt to continue current statin lipitor 80 qd

## 2023-12-29 ENCOUNTER — Other Ambulatory Visit (INDEPENDENT_AMBULATORY_CARE_PROVIDER_SITE_OTHER): Payer: PPO

## 2023-12-29 ENCOUNTER — Ambulatory Visit: Payer: PPO | Admitting: Orthopaedic Surgery

## 2023-12-29 ENCOUNTER — Encounter: Payer: Self-pay | Admitting: Orthopaedic Surgery

## 2023-12-29 DIAGNOSIS — M25511 Pain in right shoulder: Secondary | ICD-10-CM | POA: Diagnosis not present

## 2023-12-29 DIAGNOSIS — G8929 Other chronic pain: Secondary | ICD-10-CM

## 2023-12-29 MED ORDER — BUPIVACAINE HCL 0.5 % IJ SOLN
3.0000 mL | INTRAMUSCULAR | Status: AC | PRN
Start: 2023-12-29 — End: 2023-12-29
  Administered 2023-12-29: 3 mL via INTRA_ARTICULAR

## 2023-12-29 MED ORDER — METHYLPREDNISOLONE ACETATE 40 MG/ML IJ SUSP
40.0000 mg | INTRAMUSCULAR | Status: AC | PRN
Start: 2023-12-29 — End: 2023-12-29
  Administered 2023-12-29: 40 mg via INTRA_ARTICULAR

## 2023-12-29 MED ORDER — LIDOCAINE HCL 1 % IJ SOLN
3.0000 mL | INTRAMUSCULAR | Status: AC | PRN
Start: 2023-12-29 — End: 2023-12-29
  Administered 2023-12-29: 3 mL

## 2023-12-29 NOTE — Progress Notes (Signed)
Office Visit Note   Patient: Ryan Diaz           Date of Birth: May 05, 1946           MRN: [id number] Visit Date: 12/29/2023              Requested by: Corwin Levins, MD [address] [place],  [place] [address] PCP: Corwin Levins, MD   Assessment & Plan: Visit Diagnoses:  1. Chronic right shoulder pain     Plan: Impression is right shoulder rotator cuff arthropathy.  Today, we discussed various treatment options to include repeat cortisone injection for which he would like to proceed.  He will follow-up with [place] as needed.  Call with concerns or questions.  Follow-Up Instructions: Return if symptoms worsen or fail to improve.   Orders:  Orders Placed This Encounter  Procedures   XR Shoulder Right   No orders of the defined types were placed in this encounter.     Procedures: Large Joint Inj: R subacromial bursa on 12/29/2023 12:58 PM Indications: pain Details: 22 G needle  Arthrogram: No  Medications: 3 mL lidocaine 1 %; 3 mL bupivacaine 0.5 %; 40 mg methylPREDNISolone acetate 40 MG/ML Outcome: tolerated well, no immediate complications Consent was given by the patient. Patient was prepped and draped in the usual sterile fashion.       Clinical Data: No additional findings.   Subjective: Chief Complaint  Patient presents with   Right Shoulder - Pain    HPI patient is a pleasant 78 year old gentleman who comes in today with recurrent right shoulder pain.  History of right shoulder rotator cuff arthropathy.  He was last seen by [place] in April 2022 where subacromial cortisone injection was performed.  He had great relief until about 3 weeks ago and his symptoms returned.  No new injury or change in activity.  The pain he has is to the anterior and lateral shoulder.  Symptoms are worse with any movement of the shoulder.  He has not taken any medicine for this pain.  He is here requesting a repeat cortisone injection.  Review of Systems as detailed in HPI.  All  others reviewed and are negative.   Objective: Vital Signs: There were no vitals taken for this visit.  Physical Exam well-developed well-nourished gentleman in no acute distress.  Alert and oriented x 3.  Ortho Exam right shoulder exam: Forward flexion to approximately 90 degrees.  Internal rotation to T12.  External rotation to 45 degrees.  Mild pain with empty can testing and speeds testing.  4 out of 5 strength throughout.  He is neurovascularly intact distally.  Specialty Comments:  No specialty comments available.  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 11/16/2023   Vitamin B2 deficiency 01/31/2023   CKD (chronic kidney disease) stage 3, GFR 30-59 ml/min (HCC) 01/31/2023   Encounter for well adult exam with abnormal findings 11/24/2022   Balanitis 11/24/2022   Low sodium levels 08/03/2022   Dementia (HCC) 07/31/2022   Urinary frequency 06/23/2022   Headache 02/25/2022   Hidradenitis 10/04/2021   Ptosis of both eyebrows 10/02/2021   Coronary artery disease 06/13/2021   Alcohol abuse 05/13/2021   Alcohol intake above recommended sensible limits 05/13/2021   Dermatochalasis of right upper eyelid 05/13/2021   Disorder of teeth  and supporting structures 05/13/2021   Dry eye syndrome of bilateral lacrimal glands 05/13/2021   Ectropion 05/13/2021   Ex-smoker 05/13/2021   Generalized anxiety disorder 05/13/2021   Hypertrophy of prostate without urinary obstruction and other lower urinary tract symptoms (LUTS) 05/13/2021   Impacted cerumen 05/13/2021   Impotence of organic origin 05/13/2021   Low back pain 05/13/2021   Mechanical ptosis of bilateral eyelids 05/13/2021   Open angle with borderline findings, low risk, bilateral 05/13/2021   Osteoarthritis of wrist 05/13/2021   Other  specified disorders of eyelid 05/13/2021   Peyronie's disease 05/13/2021   Senile ectropion of right lower eyelid 05/13/2021   Unspecified ectropion of left lower eyelid 05/13/2021   Preventative health care 05/13/2021   Shortness of breath 10/17/2020   Accelerating angina (HCC) 10/17/2020   Disorder of rotator cuff syndrome of right shoulder and allied disorder 10/04/2020   Wrist pain 07/27/2020   Vitamin D deficiency 06/01/2020   Weight loss 09/19/2019   Rash 03/27/2019   Right foot pain 12/08/2018   Blurred vision, bilateral 10/19/2018   Bilateral carpal tunnel syndrome 07/11/2018   Tenosynovitis of hand 07/11/2018   Right ankle pain 03/09/2018   Facial rash 10/16/2017   Bruising 02/11/2017   Cough 12/16/2016   Wheezing 12/16/2016   Memory dysfunction 07/15/2016   Dizziness 07/10/2015   Mastoiditis 07/10/2015   Fatigue 07/10/2015   Abnormal TSH 07/10/2015   Pulsatile tinnitus of right ear 01/23/2015   Renal insufficiency 01/09/2015   Bilateral ankle pain 04/10/2014   OSA (obstructive sleep apnea) 09/27/2013   Right carpal tunnel syndrome 07/06/2013   Insomnia 05/18/2012   Nocturia 11/18/2011   Impaired glucose tolerance 09/26/2011   COPD mixed type (HCC) 10/03/2009   HLD (hyperlipidemia) 08/08/2007   Anxiety state 08/08/2007   Erectile dysfunction 08/08/2007   ABUSE, ALCOHOL, CONTINUOUS 08/08/2007   Depression 08/08/2007   Essential hypertension 08/08/2007   Allergic rhinitis 08/08/2007   GERD 08/08/2007   GENITAL HERPES, HX OF 08/08/2007   Past Medical History:  Diagnosis Date   ABUSE, ALCOHOL, CONTINUOUS 08/08/2007   ALLERGIC RHINITIS 08/08/2007   ANXIETY 08/08/2007   Carpal tunnel syndrome    COPD 10/03/2009   DEPRESSION 08/08/2007   ERECTILE DYSFUNCTION 08/08/2007   GENITAL HERPES, HX OF 08/08/2007   GERD 08/08/2007   GLUCOSE INTOLERANCE 08/08/2007   HYPERLIPIDEMIA 08/08/2007   HYPERTENSION 08/08/2007   Impaired glucose tolerance 09/26/2011   Insomnia 05/18/2012    PAIN IN SOFT TISSUES OF LIMB 10/02/2009   Sleep apnea    OSA-uses CPAP nightly    Family History  Problem Relation Age of Onset   Colon polyps Brother 24   Colon cancer Neg Hx    Esophageal cancer Neg Hx    Rectal cancer Neg Hx    Stomach cancer Neg Hx     Past Surgical History:  Procedure Laterality Date   ACHILLES TENDON REPAIR Bilateral    CARPAL TUNNEL RELEASE Right 08/18/2018   Procedure: RIGHT CARPAL TUNNEL RELEASE;  Surgeon: Betha Loa, MD;  Location: [facility];  Service: Orthopedics;  Laterality: Right;   CARPAL TUNNEL RELEASE Left 11/21/2018   Procedure: LEFT CARPAL TUNNEL RELEASE;  Surgeon: Betha Loa, MD;  Location:  SURGERY CENTER;  Service: Orthopedics;  Laterality: Left;  Bier block   COLONOSCOPY  12/2011   Kaplan-MAC-moviprep(exc)-tics/int hems/normal-10 yr recall   LEFT HEART CATH AND CORONARY ANGIOGRAPHY N/A 10/17/2020   Procedure: LEFT HEART CATH AND CORONARY ANGIOGRAPHY;  Surgeon: Yvonne Kendall, MD;  Location: [facility];  Service: Cardiovascular;  Laterality: N/A;   Social History   Occupational History   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

## 2024-01-08 NOTE — Progress Notes (Unsigned)
 HPI male former smoker followed for OSA, insomnia, complicated by ETOH, COPD, GERD, HBP NPSG 10/31/13- AHI 52/hour, desaturation to 83%, body weight 165 pounds Unattended Home Sleep Test 09/02/2016- AHI 9.4/hour, desaturation to 87%, body weight 163 pounds Office Spirometry 01/2019-moderate obstructive airways disease.  FVC 3.6/92%, FEV1 2.0/67%, ratio 1.54, FEF 25-75% 1.9/45% PFT 10/16/20- moderately severe obstruction, some resp to BD, mod severe DLCO deficit, emphysema pattern FEV1/FVC 0.52 Walk test room air 01/22/21- lowest sat 91%, max HR 10 PFT 07/07/22- Moderately severe obstruction, insignif resp to BD, severe DLCO deficit Walk Test on Room 07/06/22- Max HR 97/ min, minimum O2 sat 92%. -------------------------------------------------------------------------------------   04/08/23-78 year old male former smoker followed for OSA, insomnia, complicated by history EtOH, COPD, GERD, HTN, -Albuterol hfa,  Neb albuterol/ Duoneb, Breztri> Trelegy,  Atrovent nasal,   Trazodone 50 CPAP auto 5-20/[employer]/ [employer]  AirSense 11 AutoSet   Download compliance-  Body weight today-    Acute visit-  -SOB increased over last 3-4 months with exertion, productive cough with yellow phlegm x2 weeks Still on Augmentin from recent urgent care visit.  Finished prednisone. Has had bronchitis exacerbation with yellow sputum.  Noting increased dyspnea on exertion especially if he hurries up stairs or to carry his grandchild. He asks to change from [facility] to Trelegy which he thought worked better for him.  01/10/24- 78 year old male former smoker followed for OSA, insomnia, complicated by history EtOH, COPD, GERD, HTN, -Albuterol hfa,  Neb albuterol/ Duoneb, Breztri> Trelegy,  Atrovent nasal,   Trazodone 50 CPAP auto 5-20/[employer]/ [employer]  AirSense 11 AutoSet   Download compliance-  Body weight today-     CXR 04/08/23- IMPRESSION: 1. No active cardiopulmonary disease. No evidence of pneumonia or pulmonary  edema. 2. COPD.  ROS-see HPI   + = positive Constitutional:    weight loss, night sweats, fevers, chills, +fatigue, lassitude. HEENT:    headaches, difficulty swallowing, tooth/dental problems, sore throat,       sneezing, itching, ear ache, nasal congestion, post nasal drip, snoring CV:    +chest pain, orthopnea, PND, swelling in lower extremities, anasarca,                                  izziness, palpitations Resp:   +shortness of breath with exertion or at rest.                productive cough,   non-productive cough, coughing up of blood.              change in color of mucus.  wheezing.   Skin:    rash or lesions. GI:  + heartburn, indigestion, abdominal pain, nausea, vomiting, diarrhea,                 change in bowel habits, loss of appetite GU: dysuria, change in color of urine, no urgency or frequency.   flank pain. MS:   joint pain, stiffness, decreased range of motion, back pain. Neuro-     nothing unusual Psych:  change in mood or affect.  depression or anxiety.   memory loss.  OBJ- Physical Exam        + no acute distress today, talkative           General- Alert, Oriented, Affect-appropriate, Distress- none acute, + slender Skin- rash-none, lesions- none, excoriation- none Lymphadenopathy- none Head- atraumatic            Eyes- Gross vision  intact, PERRLA, conjunctivae and secretions clear            Ears- Hearing, canals-normal            Nose- Clear, no-Septal dev, mucus, polyps, erosion, perforation             Throat- Mallampati III-IV , mucosa clear , drainage- none, tonsils- atrophic Neck- flexible , trachea midline, no stridor , thyroid nl, carotid no bruit Chest - symmetrical excursion , unlabored           Heart/CV- RRR , no murmur , no gallop  , no rub, nl s1 s2                           - JVD- none , edema- none, stasis changes- none, varices- none           Lung- clear to P&A, wheeze- none, cough- none , dullness-none, rub- none           Chest wall-   Abd-  Br/ Gen/ Rectal- Not done, not indicated Extrem- cyanosis- none, clubbing, none, atrophy- none, strength- nl Neuro- grossly intact to observation

## 2024-01-10 ENCOUNTER — Encounter: Payer: Self-pay | Admitting: Internal Medicine

## 2024-01-10 ENCOUNTER — Ambulatory Visit: Payer: PPO | Admitting: Internal Medicine

## 2024-01-10 VITALS — BP 135/78 | HR 74 | Temp 97.8°F | Resp 18 | Ht 68.0 in | Wt 164.0 lb

## 2024-01-10 DIAGNOSIS — G4700 Insomnia, unspecified: Secondary | ICD-10-CM

## 2024-01-10 DIAGNOSIS — G4733 Obstructive sleep apnea (adult) (pediatric): Secondary | ICD-10-CM

## 2024-01-10 DIAGNOSIS — J449 Chronic obstructive pulmonary disease, unspecified: Secondary | ICD-10-CM | POA: Diagnosis not present

## 2024-01-10 NOTE — Patient Instructions (Signed)
 We can continue CPAP 5-20. You need to really make an effort to use it at least 4 hours, every night.  Order- overnight oximetry on CPAP/ room air      dx COPD mixed type

## 2024-01-27 DIAGNOSIS — G4730 Sleep apnea, unspecified: Secondary | ICD-10-CM | POA: Diagnosis not present

## 2024-01-27 DIAGNOSIS — R0902 Hypoxemia: Secondary | ICD-10-CM | POA: Diagnosis not present

## 2024-01-31 ENCOUNTER — Telehealth: Payer: Self-pay

## 2024-01-31 MED ORDER — LEVOCETIRIZINE DIHYDROCHLORIDE 5 MG PO TABS: 5.0000 mg | ORAL_TABLET | Freq: Every evening | ORAL | 5 refills | Status: AC

## 2024-01-31 MED ORDER — TRIAMCINOLONE ACETONIDE 55 MCG/ACT NA AERO: 2.0000 | INHALATION_SPRAY | Freq: Every day | NASAL | 12 refills | Status: AC

## 2024-01-31 NOTE — Telephone Encounter (Signed)
 Ok for xyzal 5 mg every day prn, and also otc nasacort - done erx

## 2024-01-31 NOTE — Telephone Encounter (Signed)
 Copied from CRM [id number]. Topic: Clinical - Medication Question >> Jan 31, 2024  9:53 AM Adele Barthel wrote: Reason for CRM:   Patient has had runny nose and nasal congestion since Friday. Denies cough or chest congestion. Believes it may be allergies; however, is concerned because he has a colonoscopy scheduled for this Friday. He is requesting medication be called into the pharmacy if possible to treat his symptoms so he can still attend his procedure on Friday. If he is not able to make the procedure, he will have to wait until 06/2024.   Requests CB when medication is called in  # [phone]

## 2024-02-01 NOTE — Telephone Encounter (Signed)
 Called and let Pt know

## 2024-02-09 ENCOUNTER — Telehealth: Payer: Self-pay

## 2024-02-09 NOTE — Progress Notes (Unsigned)
 Attempted to reach patient for [facility] outreach telephone visit - medication adherence review for Losartan and Atorvastatin.   Left voicemail with message to return call at earliest convenience. Will route to embedded pharmacist for follow up.   Verdene Rio, PharmD PGY1 Pharmacy Resident

## 2024-02-16 ENCOUNTER — Encounter (HOSPITAL_COMMUNITY): Payer: Self-pay

## 2024-02-16 NOTE — Progress Notes (Signed)
 Called pt to see if he was interested in participating in the PR program. No answer. LVM.

## 2024-02-16 NOTE — Progress Notes (Signed)
 Received referral from Dr. Kathrene Alu for this pt to participate in Pulmonary Rehab with the diagnosis of COPD. Clinical review of pt follow up appt on 01/10/24 Pulmonary office note. Pt appropriate for scheduling for Pulmonary rehab. Will forward to support staff for scheduling and verification of insurance eligibility/benefits with pt consent.   Ryan Diaz Cardiac and Pulmonary Rehab

## 2024-02-17 ENCOUNTER — Telehealth (HOSPITAL_COMMUNITY): Payer: Self-pay

## 2024-02-17 ENCOUNTER — Encounter (HOSPITAL_COMMUNITY): Payer: Self-pay

## 2024-02-17 ENCOUNTER — Other Ambulatory Visit (HOSPITAL_COMMUNITY): Payer: Self-pay

## 2024-02-17 NOTE — Telephone Encounter (Signed)
 Attempted to call patient in regards to Pulmonary Rehab - LM on VM   Mailed letter

## 2024-02-17 NOTE — Telephone Encounter (Signed)
 Pt returned PR phone call and stated he is interested in PR. Patient will come in for orientation on 02/18/24 @ 9AM and will attend the 10:15AM exercise class.   Pensions consultant.

## 2024-02-18 ENCOUNTER — Encounter (HOSPITAL_COMMUNITY): Payer: Self-pay

## 2024-02-18 ENCOUNTER — Encounter (HOSPITAL_COMMUNITY)
Admission: RE | Admit: 2024-02-18 | Discharge: 2024-02-18 | Disposition: A | Discharge status: Home / Self Care | Source: Ambulatory Visit | Attending: Pulmonary Disease | Admitting: Pulmonary Disease

## 2024-02-18 VITALS — BP 111/65 | HR 89 | Wt 163.8 lb

## 2024-02-18 DIAGNOSIS — J449 Chronic obstructive pulmonary disease, unspecified: Secondary | ICD-10-CM | POA: Diagnosis present

## 2024-02-18 NOTE — Progress Notes (Signed)
 Ryan Diaz 78 y.o. male Pulmonary Rehab Orientation Note This patient who was referred to Pulmonary Rehab by Dr. Kathrene Alu with the diagnosis of COPD arrived today in Cardiac and Pulmonary Rehab. He arrived ambulatory with normal gait. He does not carry portable oxygen. [employer] is the provider for their DME. Per patient, Tell uses oxygen never. Color good, skin warm and dry. Patient is oriented to time and place. Patient's medical history, psychosocial health, and medications reviewed. Psychosocial assessment reveals patient lives with alone. Guerin is currently retired. Patient hobbies include  working on cars . Patient reports his stress level is low. Areas of stress/anxiety include health. Patient does not exhibit signs of depression. PHQ2/9 score 0/3. Jontrell shows good  coping skills with positive outlook on life. Offered emotional support and reassurance. Will continue to monitor. Physical assessment performed by Nurse pick: Essie Hart RN. Please see their orientation physical assessment note. Santiel reports he  does take medications as prescribed. Patient states he  follows a regular  diet. The patient reports he would like to gain weight. Patient's weight will be monitored closely. Demonstration and practice of PLB using pulse oximeter. Donyel able to return demonstration satisfactorily. Safety and hand hygiene in the exercise area reviewed with patient. Charod voices understanding of the information reviewed. Department expectations discussed with patient and achievable goals were set. The patient shows enthusiasm about attending the program and we look forward to working with Jonni Sanger. Michale completed a 6 min walk test today and is scheduled to begin exercise on 02/22/24 @10 :15.   [id number] Guss Bunde, BSRT

## 2024-02-18 NOTE — Progress Notes (Signed)
 Pulmonary Rehab Orientation Physical Assessment Note    Well appearing, A&Ox4, NAD Eyes/Ears: wears corrective glasses, states he has ringing in his ears on/off, ever since he got out the [employer]. He was an [employer]'s [employer]  Lungs: Clear with no wheezes, rales, rhonchi, denies chronic cough, dyspnea on exertion Heart: Regular rate rhythm, no murmurs, no rubs, no clicks Gastrointestinal: abdomin soft, + bowel sounds in all 4 quads, denies recent weight gain or loss, endorses normal BMs Genitourinary: WNL, pt denies s/s Extremities:  +2 pulses, grip strength equal, strong, no edema, no cyanosis, no clubbing Integumentary: pt denies any rashes, open or non healing wounds Psy/Soc: Pt denies any needs at this time, will f/u every month to assess for needs Assistive devices: Pt has home BP cuff

## 2024-02-18 NOTE — Progress Notes (Signed)
 Ryan Diaz 78 y.o. male  Initial Psychosocial Assessment  Pt psychosocial assessment reveals pt lives alone. Pt is currently retired. Pt hobbies include  working on cars . Pt reports his  stress level is low. Areas of stress/anxiety include health.  Pt does not exhibit signs of depression.  Pt shows good  coping skills with positive outlook . Offered emotional support and reassurance. Monitor and evaluate progress toward psychosocial goal(s).  Goal(s): Help patient work toward returning to meaningful activities that improve patient's QOL and are attainable with patient's lung disease   02/18/2024 10:07 AM

## 2024-02-18 NOTE — Progress Notes (Signed)
 Pulmonary Individual Treatment Plan  Patient Details  Name: Ryan Diaz MRN: [id number] Date of Birth: 30-Sep-1946 Referring Provider:   Doristine Devoid Pulmonary Rehab Walk Test from 02/18/2024 in [facility]  Referring Provider [name]       Initial Encounter Date:  Flowsheet Row Pulmonary Rehab Walk Test from 02/18/2024 in [facility]  Date 02/18/24       Visit Diagnosis: Chronic obstructive pulmonary disease, unspecified COPD type (HCC)  Patient's Home Medications on Admission:   Current Outpatient Medications:    albuterol (ACCUNEB) 0.63 MG/3ML nebulizer solution, Take 3 mLs (0.63 mg total) by nebulization every 6 (six) hours as needed for wheezing., Disp: 75 mL, Rfl: 12   albuterol (VENTOLIN HFA) 108 (90 Base) MCG/ACT inhaler, Inhale 2 puffs into the lungs every 6 (six) hours as needed for wheezing or shortness of breath., Disp: 18 g, Rfl: 12   aspirin EC 81 MG tablet, Take 81 mg by mouth daily., Disp: , Rfl:    atorvastatin (LIPITOR) 80 MG tablet, Take 1 tablet by mouth once daily, Disp: 90 tablet, Rfl: 2   cetirizine (ZYRTEC) 10 MG tablet, Take by mouth., Disp: , Rfl:    Cholecalciferol (VITAMIN D) 50 MCG (2000 UT) tablet, Take 1 tablet (2,000 Units total) by mouth daily., Disp: 90 tablet, Rfl: 3   Cholecalciferol 50 MCG (2000 UT) TABS, Take 1 tablet by mouth daily., Disp: , Rfl:    ciclesonide (ALVESCO) 160 MCG/ACT inhaler, Inhale into the lungs., Disp: , Rfl:    clotrimazole (LOTRIMIN) 1 % cream, Apply 1 application topically 2 (two) times daily as needed (irritation)., Disp: , Rfl:    clotrimazole-betamethasone (LOTRISONE) cream, Apply 1 Application topically daily. Use as directed twice per day as needed, Disp: 30 g, Rfl: 0   cromolyn (OPTICROM) 4 % ophthalmic solution, Place 1 drop into both eyes 4 (four) times daily as needed., Disp: , Rfl:     cyanocobalamin (VITAMIN B12) 1000 MCG tablet, Take 1 tablet (1,000 mcg total) by mouth daily., Disp: 90 tablet, Rfl: 3   docusate sodium (COLACE) 100 MG capsule, Take 100 mg by mouth daily as needed for mild constipation., Disp: , Rfl:    Ferrous Sulfate 27 MG TABS, Take 27 mg by mouth daily as needed (pt prefrence)., Disp: , Rfl:    fluticasone (FLONASE) 50 MCG/ACT nasal spray, Place into the nose., Disp: , Rfl:    Fluticasone-Umeclidin-Vilant (TRELEGY ELLIPTA) 100-62.5-25 MCG/ACT AEPB, Inhale 1 puff then rinse mouth, once daily, Disp: 60 each, Rfl: 12   gabapentin (NEURONTIN) 100 MG capsule, Take 1 capsule (100 mg total) by mouth 3 (three) times daily., Disp: 90 capsule, Rfl: 5   hydrocortisone 2.5 % cream, SMARTSIG:Topical 1-2 Times Daily PRN, Disp: , Rfl:    ipratropium (ATROVENT) 0.06 % nasal spray, Place 2 sprays into both nostrils 3 (three) times daily., Disp: 15 mL, Rfl: 5   levocetirizine (XYZAL ALLERGY 24HR) 5 MG tablet, Take 1 tablet (5 mg total) by mouth every evening., Disp: 30 tablet, Rfl: 5   losartan (COZAAR) 100 MG tablet, Take 1 tablet (100 mg total) by mouth daily., Disp: 90 tablet, Rfl: 3   meloxicam (MOBIC) 15 MG tablet, 1 tab by mouth once daily as needed, Disp: 90 tablet, Rfl: 1   mirtazapine (REMERON) 7.5 MG tablet, Take by mouth., Disp: , Rfl:    nitroGLYCERIN (NITROSTAT) 0.4 MG SL tablet,  Place 1 tablet (0.4 mg total) under the tongue every 5 (five) minutes as needed for chest pain., Disp: 25 tablet, Rfl: 4   Omega-3 Fatty Acids (FISH OIL) 1000 MG CAPS, Take 1,000 mg by mouth 2 (two) times daily., Disp: , Rfl:    pantoprazole (PROTONIX) 40 MG tablet, TAKE ONE TABLET BY MOUTH ONCE EVERY DAY TO CONTROL STOMACH ACID, Disp: , Rfl:    pantoprazole (PROTONIX) 40 MG tablet, Take by mouth., Disp: , Rfl:    Spacer/Aero-Holding Chambers (AEROCHAMBER MV) inhaler, Use as instructed, Disp: 1 each, Rfl: 0   tadalafil (CIALIS) 20 MG tablet, Take 20 mg by mouth daily as needed for erectile  dysfunction., Disp: , Rfl:    terazosin (HYTRIN) 2 MG capsule, Take 1 capsule (2 mg total) by mouth at bedtime., Disp: 90 capsule, Rfl: 3   Tiotropium Bromide-Olodaterol 2.5-2.5 MCG/ACT AERS, Inhale into the lungs., Disp: , Rfl:    traZODone (DESYREL) 50 MG tablet, Take 50 mg by mouth at bedtime as needed for sleep., Disp: , Rfl:    triamcinolone (NASACORT) 55 MCG/ACT AERO nasal inhaler, Place 2 sprays into the nose daily., Disp: 1 each, Rfl: 12  Past Medical History: Past Medical History:  Diagnosis Date   ABUSE, ALCOHOL, CONTINUOUS 08/08/2007   ALLERGIC RHINITIS 08/08/2007   ANXIETY 08/08/2007   Carpal tunnel syndrome    COPD 10/03/2009   DEPRESSION 08/08/2007   ERECTILE DYSFUNCTION 08/08/2007   GENITAL HERPES, HX OF 08/08/2007   GERD 08/08/2007   GLUCOSE INTOLERANCE 08/08/2007   HYPERLIPIDEMIA 08/08/2007   HYPERTENSION 08/08/2007   Impaired glucose tolerance 09/26/2011   Insomnia 05/18/2012   PAIN IN SOFT TISSUES OF LIMB 10/02/2009   Sleep apnea    OSA-uses CPAP nightly    Tobacco Use: Social History   Tobacco Use  Smoking Status Former   Current packs/day: 0.00   Average packs/day: 1.5 packs/day for 35.0 years (52.5 ttl pk-yrs)   Types: Cigarettes   Start date: 09/04/1974   Quit date: 09/04/2009   Years since quitting: 14.4  Smokeless Tobacco Never    Labs: Review Flowsheet  More data exists      Latest Ref Rng & Units 06/23/2022 07/31/2022 11/24/2022 06/18/2023 11/16/2023  Labs for ITP Cardiac and Pulmonary Rehab  Cholestrol 0 - 200 mg/dL 161  096  045  409  811   LDL (calc) 0 - 99 mg/dL 69  65  44  71  59   HDL-C >39.00 mg/dL 91.47  82.95  62.13  08.65  34.00   Trlycerides 0.0 - 149.0 mg/dL 78.4  696.2  952.8  413.2  81.0   Hemoglobin A1c 4.6 - 6.5 % 6.0  5.8  5.9  5.8  6.0     Capillary Blood Glucose: No results found for: "GLUCAP"   Pulmonary Assessment Scores:  Pulmonary Assessment Scores     Row Name 02/18/24 1001         ADL UCSD   ADL Phase Entry     SOB  Score total 34       CAT Score   CAT Score 14       mMRC Score   mMRC Score 1             UCSD: Self-administered rating of dyspnea associated with activities of daily living (ADLs) 6-point scale (0 = "not at all" to 5 = "maximal or unable to do because of breathlessness")  Scoring Scores range from 0 to 120.  Minimally important difference is  5 units  CAT: CAT can identify the health impairment of COPD patients and is better correlated with disease progression.  CAT has a scoring range of zero to 40. The CAT score is classified into four groups of low (less than 10), medium (10 - 20), high (21-30) and very high (31-40) based on the impact level of disease on health status. A CAT score over 10 suggests significant symptoms.  A worsening CAT score could be explained by an exacerbation, poor medication adherence, poor inhaler technique, or progression of COPD or comorbid conditions.  CAT MCID is 2 points  mMRC: mMRC (Modified Medical Research Council) Dyspnea Scale is used to assess the degree of baseline functional disability in patients of respiratory disease due to dyspnea. No minimal important difference is established. A decrease in score of 1 point or greater is considered a positive change.   Pulmonary Function Assessment:  Pulmonary Function Assessment - 02/18/24 0943       Breath   Bilateral Breath Sounds Clear;Decreased    Shortness of Breath Yes;Fear of Shortness of Breath;Limiting activity;Panic with Shortness of Breath             Exercise Target Goals: Exercise Program Goal: Individual exercise prescription set using results from initial 6 min walk test and THRR while considering  patient's activity barriers and safety.   Exercise Prescription Goal: Initial exercise prescription builds to 30-45 minutes a day of aerobic activity, 2-3 days per week.  Home exercise guidelines will be given to patient during program as part of exercise prescription that the  participant will acknowledge.  Activity Barriers & Risk Stratification:  Activity Barriers & Cardiac Risk Stratification - 02/18/24 0941       Activity Barriers & Cardiac Risk Stratification   Activity Barriers Deconditioning;Muscular Weakness;Shortness of Breath;Arthritis;Balance Concerns    Cardiac Risk Stratification Moderate             6 Minute Walk:  6 Minute Walk     Row Name 02/18/24 1027         6 Minute Walk   Phase Initial     Distance 1295 feet     Walk Time 6 minutes     # of Rest Breaks 0     MPH 2.45     METS 2.66     RPE 11     Perceived Dyspnea  1     VO2 Peak 9.32     Symptoms No     Resting HR 89 bpm     Resting BP 111/65     Resting Oxygen Saturation  98 %     Exercise Oxygen Saturation  during 6 min walk 95 %     Max Ex. HR 106 bpm     Max Ex. BP 128/70     2 Minute Post BP 102/60       Interval HR   1 Minute HR 97     2 Minute HR 105     3 Minute HR 101     4 Minute HR 101     5 Minute HR 106     6 Minute HR 106     2 Minute Post HR 83     Interval Heart Rate? Yes       Interval Oxygen   Interval Oxygen? Yes     Baseline Oxygen Saturation % 98 %     1 Minute Oxygen Saturation % 97 %     1 Minute Liters of Oxygen  0 L     2 Minute Oxygen Saturation % 95 %     2 Minute Liters of Oxygen 0 L     3 Minute Oxygen Saturation % 97 %     3 Minute Liters of Oxygen 0 L     4 Minute Oxygen Saturation % 95 %     4 Minute Liters of Oxygen 0 L     5 Minute Oxygen Saturation % 966 %     5 Minute Liters of Oxygen 0 L     6 Minute Oxygen Saturation % 96 %     6 Minute Liters of Oxygen 0 L     2 Minute Post Oxygen Saturation % 98 %     2 Minute Post Liters of Oxygen 0 L              Oxygen Initial Assessment:  Oxygen Initial Assessment - 02/18/24 0943       Home Oxygen   Home Oxygen Device None    Sleep Oxygen Prescription CPAP    Home Exercise Oxygen Prescription None    Home Resting Oxygen Prescription None      Initial 6 min  Walk   Oxygen Used None      Program Oxygen Prescription   Program Oxygen Prescription None      Intervention   Short Term Goals To learn and understand importance of monitoring SPO2 with pulse oximeter and demonstrate accurate use of the pulse oximeter.;To learn and understand importance of maintaining oxygen saturations>88%;To learn and demonstrate proper pursed lip breathing techniques or other breathing techniques. ;To learn and demonstrate proper use of respiratory medications    Long  Term Goals Maintenance of O2 saturations>88%;Compliance with respiratory medication;Verbalizes importance of monitoring SPO2 with pulse oximeter and return demonstration;Exhibits proper breathing techniques, such as pursed lip breathing or other method taught during program session;Demonstrates proper use of MDI's             Oxygen Re-Evaluation:   Oxygen Discharge (Final Oxygen Re-Evaluation):   Initial Exercise Prescription:  Initial Exercise Prescription - 02/18/24 1000       Date of Initial Exercise RX and Referring Provider   Date 02/18/24    Referring Provider Charlies Constable   Expected Discharge Date 05/16/24      Treadmill   MPH 2.5    Grade 0    Minutes 15    METs 2.91      Recumbant Elliptical   Level 2    RPM 253    Watts 50    Minutes 15    METs 2.5      Prescription Details   Frequency (times per week) 2    Duration Progress to 30 minutes of continuous aerobic without signs/symptoms of physical distress      Intensity   THRR 40-80% of Max Heartrate 57-114    Ratings of Perceived Exertion 11-13    Perceived Dyspnea 0-4      Progression   Progression Continue to progress workloads to maintain intensity without signs/symptoms of physical distress.      Resistance Training   Training Prescription Yes    Weight blue bands    Reps 10-15             Perform Capillary Blood Glucose checks as needed.  Exercise Prescription Changes:   Exercise  Comments:   Exercise Goals and Review:   Exercise Goals     Row Name 02/18/24 [phone]  Exercise Goals   Increase Physical Activity Yes       Intervention Provide advice, education, support and counseling about physical activity/exercise needs.;Develop an individualized exercise prescription for aerobic and resistive training based on initial evaluation findings, risk stratification, comorbidities and participant's personal goals.       Expected Outcomes Short Term: Attend rehab on a regular basis to increase amount of physical activity.;Long Term: Add in home exercise to make exercise part of routine and to increase amount of physical activity.;Long Term: Exercising regularly at least 3-5 days a week.       Increase Strength and Stamina Yes       Intervention Provide advice, education, support and counseling about physical activity/exercise needs.;Develop an individualized exercise prescription for aerobic and resistive training based on initial evaluation findings, risk stratification, comorbidities and participant's personal goals.       Expected Outcomes Short Term: Increase workloads from initial exercise prescription for resistance, speed, and METs.;Short Term: Perform resistance training exercises routinely during rehab and add in resistance training at home;Long Term: Improve cardiorespiratory fitness, muscular endurance and strength as measured by increased METs and functional capacity ( )       Able to understand and use rate of perceived exertion (RPE) scale Yes       Intervention Provide education and explanation on how to use RPE scale       Expected Outcomes Short Term: Able to use RPE daily in rehab to express subjective intensity level;Long Term:  Able to use RPE to guide intensity level when exercising independently       Able to understand and use Dyspnea scale Yes       Intervention Provide education and explanation on how to use Dyspnea scale       Expected  Outcomes Short Term: Able to use Dyspnea scale daily in rehab to express subjective sense of shortness of breath during exertion;Long Term: Able to use Dyspnea scale to guide intensity level when exercising independently       Knowledge and understanding of Target Heart Rate Range (THRR) Yes       Intervention Provide education and explanation of THRR including how the numbers were predicted and where they are located for reference       Expected Outcomes Short Term: Able to state/look up THRR;Long Term: Able to use THRR to govern intensity when exercising independently;Short Term: Able to use daily as guideline for intensity in rehab       Understanding of Exercise Prescription Yes       Intervention Provide education, explanation, and written materials on patient's individual exercise prescription       Expected Outcomes Short Term: Able to explain program exercise prescription;Long Term: Able to explain home exercise prescription to exercise independently                Exercise Goals Re-Evaluation :   Discharge Exercise Prescription (Final Exercise Prescription Changes):   Nutrition:  Target Goals: Understanding of nutrition guidelines, daily intake of sodium 1500mg , cholesterol 200mg , calories 30% from fat and 7% or less from saturated fats, daily to have 5 or more servings of fruits and vegetables.  Biometrics:    Nutrition Therapy Plan and Nutrition Goals:   Nutrition Assessments:  MEDIFICTS Score Key: >=70 Need to make dietary changes  40-70 Heart Healthy Diet <= 40 Therapeutic Level Cholesterol Diet  Flowsheet Row PULMONARY REHAB CHRONIC OBSTRUCTIVE PULMONARY DISEASE from 07/17/2021 in [facility]  Picture Your Plate Total Score on Discharge 53      Picture Your Plate Scores: <16 Unhealthy dietary pattern with much room for improvement. 41-50 Dietary pattern unlikely to meet recommendations for good health  and room for improvement. 51-60 More healthful dietary pattern, with some room for improvement.  >60 Healthy dietary pattern, although there may be some specific behaviors that could be improved.    Nutrition Goals Re-Evaluation:   Nutrition Goals Discharge (Final Nutrition Goals Re-Evaluation):   Psychosocial: Target Goals: Acknowledge presence or absence of significant depression and/or stress, maximize coping skills, provide positive support system. Participant is able to verbalize types and ability to use techniques and skills needed for reducing stress and depression.  Initial Review & Psychosocial Screening:  Initial Psych Review & Screening - 02/18/24 0934       Initial Review   Current issues with None Identified      Family Dynamics   Good Support System? Yes      Barriers   Psychosocial barriers to participate in program There are no identifiable barriers or psychosocial needs.      Screening Interventions   Interventions Encouraged to exercise             Quality of Life Scores:  Scores of 19 and below usually indicate a poorer quality of life in these areas.  A difference of  2-3 points is a clinically meaningful difference.  A difference of 2-3 points in the total score of the Quality of Life Index has been associated with significant improvement in overall quality of life, self-image, physical symptoms, and general health in studies assessing change in quality of life.  PHQ-9: Review Flowsheet  More data exists      02/18/2024 12/22/2023 11/16/2023 06/21/2023 06/18/2023  Depression screen PHQ 2/9  Decreased Interest 0 0 0 1 1  Down, Depressed, Hopeless 0 0 0 0 0  PHQ - 2 Score 0 0 0 1 1  Altered sleeping 1 - - - 1  Tired, decreased energy 1 - - - 1  Change in appetite 1 - - - 1  Feeling bad or failure about yourself  0 - - - 0  Trouble concentrating 0 - - - 1  Moving slowly or fidgety/restless 0 - - - 0  Suicidal thoughts 0 - - - 0  PHQ-9 Score 3 - - - 5   Difficult doing work/chores Somewhat difficult - - - Not difficult at all   Interpretation of Total Score  Total Score Depression Severity:  1-4 = Minimal depression, 5-9 = Mild depression, 10-14 = Moderate depression, 15-19 = Moderately severe depression, 20-27 = Severe depression   Psychosocial Evaluation and Intervention:  Psychosocial Evaluation - 02/18/24 1002       Psychosocial Evaluation & Interventions   Interventions Encouraged to exercise with the program and follow exercise prescription    Comments No psychosocial concerns identified at this time    Expected Outcomes For Eliel to participate in PR free of any psychosocial barriers or concerns    Continue Psychosocial Services  No Follow up required             Psychosocial Re-Evaluation:   Psychosocial Discharge (Final Psychosocial Re-Evaluation):   Education: Education Goals: Education classes will be provided on a weekly basis, covering required topics. Participant will state understanding/return demonstration of topics presented.  Learning Barriers/Preferences:  Learning Barriers/Preferences - 02/18/24 0936       Learning Barriers/Preferences   Learning Barriers Sight  wears glasses   Learning Preferences Audio;Computer/Internet;Group Instruction;Individual Instruction;Pictoral;Skilled Demonstration;Written Material             Education Topics: Know Your Numbers Group instruction that is supported by a PowerPoint presentation. Instructor discusses importance of knowing and understanding resting, exercise, and post-exercise oxygen saturation, heart rate, and blood pressure. Oxygen saturation, heart rate, blood pressure, rating of perceived exertion, and dyspnea are reviewed along with a normal range for these values.    Exercise for the Pulmonary Patient Group instruction that is supported by a PowerPoint presentation. Instructor discusses benefits of exercise, core components of exercise,  frequency, duration, and intensity of an exercise routine, importance of utilizing pulse oximetry during exercise, safety while exercising, and options of places to exercise outside of rehab.  Flowsheet Row PULMONARY REHAB CHRONIC OBSTRUCTIVE PULMONARY DISEASE from 07/17/2021 in [facility], [facility]  Date 06/12/21  Educator Jess-Handout  [METS]  Instruction Review Code 1- Verbalizes Understanding       MET Level  Group instruction provided by PowerPoint, verbal discussion, and written material to support subject matter. Instructor reviews what METs are and how to increase METs.    Pulmonary Medications Verbally interactive group education provided by instructor with focus on inhaled medications and proper administration. Flowsheet Row PULMONARY REHAB CHRONIC OBSTRUCTIVE PULMONARY DISEASE from 07/17/2021 in [facility] for [facility], V[facility], & Lung Health  Date 06/19/21  Educator Handout       Anatomy and Physiology of the Respiratory System Group instruction provided by PowerPoint, verbal discussion, and written material to support subject matter. Instructor reviews respiratory cycle and anatomical components of the respiratory system and their functions. Instructor also reviews differences in obstructive and restrictive respiratory diseases with examples of each.  Flowsheet Row PULMONARY REHAB CHRONIC OBSTRUCTIVE PULMONARY DISEASE from 07/17/2021 in [facility], [facility] Lung [facility]  Date 06/26/21  Educator Handout       Oxygen Safety Group instruction provided by PowerPoint, verbal discussion, and written material to support subject matter. There is an overview of "What is Oxygen" and "Why do we need it".  Instructor also reviews how to create a safe environment for oxygen use, the importance of using oxygen as prescribed, and the risks of noncompliance. There is a brief discussion  on traveling with oxygen and resources the patient may utilize.   Oxygen Use Group instruction provided by PowerPoint, verbal discussion, and written material to discuss how supplemental oxygen is prescribed and different types of oxygen supply systems. Resources for more information are provided.    Breathing Techniques Group instruction that is supported by demonstration and informational handouts. Instructor discusses the benefits of pursed lip and diaphragmatic breathing and detailed demonstration on how to perform both.     Risk Factor Reduction Group instruction that is supported by a PowerPoint presentation. Instructor discusses the definition of a risk factor, different risk factors for pulmonary disease, and how the heart and lungs work together.   Pulmonary Diseases Group instruction provided by PowerPoint, verbal discussion, and written material to support subject matter. Instructor gives an overview of the different type of pulmonary diseases. There is also a discussion on risk factors and symptoms as well as ways to manage the diseases.   Stress and Energy Conservation Group instruction provided by PowerPoint, verbal discussion, and written material to support subject matter. Instructor gives an overview of stress and the impact it can have on the body. Instructor also  reviews ways to reduce stress. There is also a discussion on energy conservation and ways to conserve energy throughout the day.   Warning Signs and Symptoms Group instruction provided by PowerPoint, verbal discussion, and written material to support subject matter. Instructor reviews warning signs and symptoms of stroke, heart attack, cold and flu. Instructor also reviews ways to prevent the spread of infection.   Other Education Group or individual verbal, written, or video instructions that support the educational goals of the pulmonary rehab program. Flowsheet Row PULMONARY REHAB CHRONIC OBSTRUCTIVE  PULMONARY DISEASE from 07/17/2021 in [facility] for Heart, Vascular, & Lung Health  Date 07/17/21  Educator Handout  [How to beat a sedentary lifestyle]        Knowledge Questionnaire Score:  Knowledge Questionnaire Score - 02/18/24 1000       Knowledge Questionnaire Score   Pre Score 14/18             Core Components/Risk Factors/Patient Goals at Admission:  Personal Goals and Risk Factors at Admission - 02/18/24 0937       Core Components/Risk Factors/Patient Goals on Admission    Weight Management Yes;Weight Gain    Intervention Weight Management: Develop a combined nutrition and exercise program designed to reach desired caloric intake, while maintaining appropriate intake of nutrient and fiber, sodium and fats, and appropriate energy expenditure required for the weight goal.;Weight Management: Provide education and appropriate resources to help participant work on and attain dietary goals.;Weight Management/Obesity: Establish reasonable short term and long term weight goals.;Obesity: Provide education and appropriate resources to help participant work on and attain dietary goals.    Admit Weight 163 lb 12.8 oz (74.3 kg)    Expected Outcomes Short Term: Continue to assess and modify interventions until short term weight is achieved;Long Term: Adherence to nutrition and physical activity/exercise program aimed toward attainment of established weight goal;Weight Gain: Understanding of general recommendations for a high calorie, high protein meal plan that promotes weight gain by distributing calorie intake throughout the day with the consumption for 4-5 meals, snacks, and/or supplements;Understanding recommendations for meals to include 15-35% energy as protein, 25-35% energy from fat, 35-60% energy from carbohydrates, less than 200mg  of dietary cholesterol, 20-35 gm of total fiber daily;Understanding of distribution of calorie intake throughout the day with the  consumption of 4-5 meals/snacks    Improve shortness of breath with ADL's Yes    Intervention Provide education, individualized exercise plan and daily activity instruction to help decrease symptoms of SOB with activities of daily living.    Expected Outcomes Short Term: Improve cardiorespiratory fitness to achieve a reduction of symptoms when performing ADLs;Long Term: Be able to perform more ADLs without symptoms or delay the onset of symptoms             Core Components/Risk Factors/Patient Goals Review:    Core Components/Risk Factors/Patient Goals at Discharge (Final Review):    ITP Comments:   Comments: Dr. Mechele Collin is Medical Director for Pulmonary Rehab at [facility].

## 2024-02-22 ENCOUNTER — Encounter (HOSPITAL_COMMUNITY)
Admission: RE | Admit: 2024-02-22 | Discharge: 2024-02-22 | Disposition: A | Discharge status: Home / Self Care | Source: Ambulatory Visit | Attending: Pulmonary Disease | Admitting: Pulmonary Disease

## 2024-02-22 DIAGNOSIS — J449 Chronic obstructive pulmonary disease, unspecified: Secondary | ICD-10-CM

## 2024-02-22 NOTE — Progress Notes (Signed)
 Daily Session Note  Patient Details  Name: Ryan Diaz MRN: [id number] Date of Birth: 02/17/1946 Referring Provider:   Doristine Devoid Pulmonary Rehab Walk Test from 02/18/2024 in [facility]  Referring Provider [name]       Encounter Date: 02/22/2024  Check In:  Session Check In - 02/22/24 1110       Check-In   Supervising physician immediately available to respond to emergencies [facility] MD immediately available    Physician(s) Rise Paganini, NP    Location [facility]    Staff Present Raford Pitcher, MS, ACSM-CEP, Exercise Physiologist;Mary Gerre Scull, RN, BSN;Shatona Andujar Katrinka Blazing, RT;Randi Reeve BS, ACSM-CEP, Exercise Physiologist;Samantha Belarus, RD, LDN    Virtual Visit No    Medication changes reported     No    Fall or balance concerns reported    No    Tobacco Cessation No Change    Warm-up and Cool-down Performed as group-led instruction    Resistance Training Performed Yes    VAD Patient? No    PAD/SET Patient? No      Pain Assessment   Currently in Pain? No/denies    Multiple Pain Sites No             Capillary Blood Glucose: No results found for this or any previous visit (from the past 24 hours).    Social History   Tobacco Use  Smoking Status Former   Current packs/day: 0.00   Average packs/day: 1.5 packs/day for 35.0 years (52.5 ttl pk-yrs)   Types: Cigarettes   Start date: 09/04/1974   Quit date: 09/04/2009   Years since quitting: 14.4  Smokeless Tobacco Never    Goals Met:  Proper associated with RPD/PD & O2 Sat Independence with exercise equipment Exercise tolerated well No report of concerns or symptoms today Strength training completed today  Goals Unmet:  Not Applicable  Comments: Service time is from 1006 to 1126.    Dr. Mechele Collin is Medical Director for Pulmonary Rehab at [facility].

## 2024-02-24 ENCOUNTER — Encounter (HOSPITAL_COMMUNITY)
Admission: RE | Admit: 2024-02-24 | Discharge: 2024-02-24 | Disposition: A | Discharge status: Home / Self Care | Source: Ambulatory Visit | Attending: Pulmonary Disease | Admitting: Pulmonary Disease

## 2024-02-24 DIAGNOSIS — J449 Chronic obstructive pulmonary disease, unspecified: Secondary | ICD-10-CM

## 2024-02-24 NOTE — Progress Notes (Signed)
 Daily Session Note  Patient Details  Name: Ryan Diaz MRN: [id number] Date of Birth: 1946-09-08 Referring Provider:   Doristine Devoid Pulmonary Rehab Walk Test from 02/18/2024 in [facility]  Referring Provider [name]       Encounter Date: 02/24/2024  Check In:  Session Check In - 02/24/24 1029       Check-In   Supervising physician immediately available to respond to emergencies [facility] MD immediately available    Physician(s) Robin Searing NP    [facility]    Staff Present Raford Pitcher, MS, ACSM-CEP, Exercise Physiologist;Mary Gerre Scull, RN, BSN;Moria Brophy Katrinka Blazing, RT;Randi Reeve BS, ACSM-CEP, Exercise Physiologist;Samantha Belarus, RD, LDN    Virtual Visit No    Medication changes reported     No    Fall or balance concerns reported    No    Tobacco Cessation No Change    Warm-up and Cool-down Performed as group-led instruction    Resistance Training Performed Yes    VAD Patient? No    PAD/SET Patient? No      Pain Assessment   Currently in Pain? No/denies             Capillary Blood Glucose: No results found for this or any previous visit (from the past 24 hours).    Social History   Tobacco Use  Smoking Status Former   Current packs/day: 0.00   Average packs/day: 1.5 packs/day for 35.0 years (52.5 ttl pk-yrs)   Types: Cigarettes   Start date: 09/04/1974   Quit date: 09/04/2009   Years since quitting: 14.4  Smokeless Tobacco Never    Goals Met:  Proper associated with RPD/PD & O2 Sat Independence with exercise equipment Exercise tolerated well No report of concerns or symptoms today Strength training completed today  Goals Unmet:  Not Applicable  Comments: Service time is from 1006 to 1132.    Dr. Mechele Collin is Medical Director for Pulmonary Rehab at [facility].

## 2024-02-29 ENCOUNTER — Encounter (HOSPITAL_COMMUNITY)
Admission: RE | Admit: 2024-02-29 | Discharge: 2024-02-29 | Disposition: A | Discharge status: Home / Self Care | Source: Ambulatory Visit | Attending: Pulmonary Disease | Admitting: Pulmonary Disease

## 2024-02-29 VITALS — Wt 164.7 lb

## 2024-02-29 DIAGNOSIS — J449 Chronic obstructive pulmonary disease, unspecified: Secondary | ICD-10-CM

## 2024-02-29 NOTE — Progress Notes (Signed)
 Daily Session Note  Patient Details  Name: Ryan Diaz MRN: [id number] Date of Birth: Jun 13, 1946 Referring Provider:   Doristine Devoid Pulmonary Rehab Walk Test from 02/18/2024 in [facility]  Referring Provider [name]       Encounter Date: 02/29/2024  Check In:  Session Check In - 02/29/24 1034       Check-In   Supervising physician immediately available to respond to emergencies [facility] MD immediately available    Physician(s) Rise Paganini, NP    [facility]    Staff Present Raford Pitcher, MS, ACSM-CEP, Exercise Physiologist;Mary Gerre Scull, RN, BSN;Casey Katrinka Blazing, RT;Randi Reeve BS, ACSM-CEP, Exercise Physiologist    Virtual Visit No    Medication changes reported     No    Fall or balance concerns reported    No    Tobacco Cessation No Change    Warm-up and Cool-down Performed as group-led instruction    Resistance Training Performed Yes    VAD Patient? No    PAD/SET Patient? No      Pain Assessment   Currently in Pain? No/denies    Multiple Pain Sites No             Capillary Blood Glucose: No results found for this or any previous visit (from the past 24 hours).   Exercise Prescription Changes - 02/29/24 1200       Response to Exercise   Blood Pressure (Admit) 104/58    Blood Pressure (Exercise) 128/56    Blood Pressure (Exit) 96/58    Heart Rate (Admit) 78 bpm    Heart Rate (Exercise) 116 bpm    Heart Rate (Exit) 92 bpm    Oxygen Saturation (Admit) 97 %    Oxygen Saturation (Exercise) 95 %    Oxygen Saturation (Exit) 94 %    Rating of Perceived Exertion (Exercise) 13    Perceived Dyspnea (Exercise) 3    Duration Continue with 30 min of aerobic exercise without signs/symptoms of physical distress.    Intensity THRR unchanged      Resistance Training   Training Prescription Yes    Weight blue bands    Reps 10-15    Time 10 Minutes      Treadmill   MPH 2.5     Grade 1    Minutes 15    METs 3.1      Recumbant Elliptical   Level 3    RPM 64    Watts 92    Minutes 15    METs 4.3             Social History   Tobacco Use  Smoking Status Former   Current packs/day: 0.00   Average packs/day: 1.5 packs/day for 35.0 years (52.5 ttl pk-yrs)   Types: Cigarettes   Start date: 09/04/1974   Quit date: 09/04/2009   Years since quitting: 14.4  Smokeless Tobacco Never    Goals Met:  Proper associated with RPD/PD & O2 Sat Exercise tolerated well No report of concerns or symptoms today Strength training completed today  Goals Unmet:  Not Applicable  Comments: Service time is from 1006 to 1136.    Dr. Mechele Collin is Medical Director for Pulmonary Rehab at [facility].

## 2024-03-01 NOTE — Progress Notes (Signed)
 Pulmonary Individual Treatment Plan  Patient Details  Name: Ryan Diaz MRN: [id number] Date of Birth: [birth year] Referring Provider:   Doristine Devoid Pulmonary Rehab Walk Test from 02/18/2024 in [facility]  Referring Provider [name]       Initial Encounter Date:  Flowsheet Row Pulmonary Rehab Walk Test from 02/18/2024 in [facility] for Heart, Vascular, & Lung Health  Date 02/18/24       Visit Diagnosis: Chronic obstructive pulmonary disease, unspecified COPD type (HCC)  Patient's Home Medications on Admission:   Current Outpatient Medications:    albuterol (ACCUNEB) 0.63 MG/3ML nebulizer solution, Take 3 mLs (0.63 mg total) by nebulization every 6 (six) hours as needed for wheezing., Disp: 75 mL, Rfl: 12   albuterol (VENTOLIN HFA) 108 (90 Base) MCG/ACT inhaler, Inhale 2 puffs into the lungs every 6 (six) hours as needed for wheezing or shortness of breath., Disp: 18 g, Rfl: 12   aspirin EC 81 MG tablet, Take 81 mg by mouth daily., Disp: , Rfl:    atorvastatin (LIPITOR) 80 MG tablet, Take 1 tablet by mouth once daily, Disp: 90 tablet, Rfl: 2   cetirizine (ZYRTEC) 10 MG tablet, Take by mouth., Disp: , Rfl:    Cholecalciferol (VITAMIN D) 50 MCG (2000 UT) tablet, Take 1 tablet (2,000 Units total) by mouth daily., Disp: 90 tablet, Rfl: 3   Cholecalciferol 50 MCG (2000 UT) TABS, Take 1 tablet by mouth daily., Disp: , Rfl:    ciclesonide (ALVESCO) 160 MCG/ACT inhaler, Inhale into the lungs., Disp: , Rfl:    clotrimazole (LOTRIMIN) 1 % cream, Apply 1 application topically 2 (two) times daily as needed (irritation)., Disp: , Rfl:    clotrimazole-betamethasone (LOTRISONE) cream, Apply 1 Application topically daily. Use as directed twice per day as needed, Disp: 30 g, Rfl: 0   cromolyn (OPTICROM) 4 % ophthalmic solution, Place 1 drop into both eyes 4 (four) times daily as needed., Disp: , Rfl:     cyanocobalamin (VITAMIN B12) 1000 MCG tablet, Take 1 tablet (1,000 mcg total) by mouth daily., Disp: 90 tablet, Rfl: 3   docusate sodium (COLACE) 100 MG capsule, Take 100 mg by mouth daily as needed for mild constipation., Disp: , Rfl:    Ferrous Sulfate 27 MG TABS, Take 27 mg by mouth daily as needed (pt prefrence)., Disp: , Rfl:    fluticasone (FLONASE) 50 MCG/ACT nasal spray, Place into the nose., Disp: , Rfl:    Fluticasone-Umeclidin-Vilant (TRELEGY ELLIPTA) 100-62.5-25 MCG/ACT AEPB, Inhale 1 puff then rinse mouth, once daily, Disp: 60 each, Rfl: 12   gabapentin (NEURONTIN) 100 MG capsule, Take 1 capsule (100 mg total) by mouth 3 (three) times daily., Disp: 90 capsule, Rfl: 5   hydrocortisone 2.5 % cream, SMARTSIG:Topical 1-2 Times Daily PRN, Disp: , Rfl:    ipratropium (ATROVENT) 0.06 % nasal spray, Place 2 sprays into both nostrils 3 (three) times daily., Disp: 15 mL, Rfl: 5   levocetirizine (XYZAL ALLERGY 24HR) 5 MG tablet, Take 1 tablet (5 mg total) by mouth every evening., Disp: 30 tablet, Rfl: 5   losartan (COZAAR) 100 MG tablet, Take 1 tablet (100 mg total) by mouth daily., Disp: 90 tablet, Rfl: 3   meloxicam (MOBIC) 15 MG tablet, 1 tab by mouth once daily as needed, Disp: 90 tablet, Rfl: 1   mirtazapine (REMERON) 7.5 MG tablet, Take by mouth., Disp: , Rfl:    nitroGLYCERIN (NITROSTAT) 0.4 MG SL tablet,  Place 1 tablet (0.4 mg total) under the tongue every 5 (five) minutes as needed for chest pain., Disp: 25 tablet, Rfl: 4   Omega-3 Fatty Acids (FISH OIL) 1000 MG CAPS, Take 1,000 mg by mouth 2 (two) times daily., Disp: , Rfl:    pantoprazole (PROTONIX) 40 MG tablet, TAKE ONE TABLET BY MOUTH ONCE EVERY DAY TO CONTROL STOMACH ACID, Disp: , Rfl:    pantoprazole (PROTONIX) 40 MG tablet, Take by mouth., Disp: , Rfl:    Spacer/Aero-Holding Chambers (AEROCHAMBER MV) inhaler, Use as instructed, Disp: 1 each, Rfl: 0   tadalafil (CIALIS) 20 MG tablet, Take 20 mg by mouth daily as needed for erectile  dysfunction., Disp: , Rfl:    terazosin (HYTRIN) 2 MG capsule, Take 1 capsule (2 mg total) by mouth at bedtime., Disp: 90 capsule, Rfl: 3   Tiotropium Bromide-Olodaterol 2.5-2.5 MCG/ACT AERS, Inhale into the lungs., Disp: , Rfl:    traZODone (DESYREL) 50 MG tablet, Take 50 mg by mouth at bedtime as needed for sleep., Disp: , Rfl:    triamcinolone (NASACORT) 55 MCG/ACT AERO nasal inhaler, Place 2 sprays into the nose daily., Disp: 1 each, Rfl: 12  Past Medical History: Past Medical History:  Diagnosis Date   ABUSE, ALCOHOL, CONTINUOUS 08/08/2007   ALLERGIC RHINITIS 08/08/2007   ANXIETY 08/08/2007   Carpal tunnel syndrome    COPD 10/03/2009   DEPRESSION 08/08/2007   ERECTILE DYSFUNCTION 08/08/2007   GENITAL HERPES, HX OF 08/08/2007   GERD 08/08/2007   GLUCOSE INTOLERANCE 08/08/2007   HYPERLIPIDEMIA 08/08/2007   HYPERTENSION 08/08/2007   Impaired glucose tolerance 09/26/2011   Insomnia 05/18/2012   PAIN IN SOFT TISSUES OF LIMB 10/02/2009   Sleep apnea    OSA-uses CPAP nightly    Tobacco Use: Social History   Tobacco Use  Smoking Status Former   Current packs/day: 0.00   Average packs/day: 1.5 packs/day for 35.0 years (52.5 ttl pk-yrs)   Types: Cigarettes   Start date: 09/04/1974   Quit date: 09/04/2009   Years since quitting: 14.4  Smokeless Tobacco Never    Labs: Review Flowsheet  More data exists      Latest Ref Rng & Units 06/23/2022 07/31/2022 11/24/2022 06/18/2023 11/16/2023  Labs for ITP Cardiac and Pulmonary Rehab  Cholestrol 0 - 200 mg/dL 161  096  045  409  811   LDL (calc) 0 - 99 mg/dL 69  65  44  71  59   HDL-C >39.00 mg/dL 91.47  82.95  62.13  08.65  34.00   Trlycerides 0.0 - 149.0 mg/dL 78.4  696.2  952.8  413.2  81.0   Hemoglobin A1c 4.6 - 6.5 % 6.0  5.8  5.9  5.8  6.0     Capillary Blood Glucose: No results found for: "GLUCAP"   Pulmonary Assessment Scores:  Pulmonary Assessment Scores     Row Name 02/18/24 1001         ADL UCSD   ADL Phase Entry     SOB  Score total 34       CAT Score   CAT Score 14       mMRC Score   mMRC Score 1             UCSD: Self-administered rating of dyspnea associated with activities of daily living (ADLs) 6-point scale (0 = "not at all" to 5 = "maximal or unable to do because of breathlessness")  Scoring Scores range from 0 to 120.  Minimally important difference is  5 units  CAT: CAT can identify the health impairment of COPD patients and is better correlated with disease progression.  CAT has a scoring range of zero to 40. The CAT score is classified into four groups of low (less than 10), medium (10 - 20), high (21-30) and very high (31-40) based on the impact level of disease on health status. A CAT score over 10 suggests significant symptoms.  A worsening CAT score could be explained by an exacerbation, poor medication adherence, poor inhaler technique, or progression of COPD or comorbid conditions.  CAT MCID is 2 points  mMRC: mMRC (Modified Medical Research Council) Dyspnea Scale is used to assess the degree of baseline functional disability in patients of respiratory disease due to dyspnea. No minimal important difference is established. A decrease in score of 1 point or greater is considered a positive change.   Pulmonary Function Assessment:  Pulmonary Function Assessment - 02/18/24 0943       Breath   Bilateral Breath Sounds Clear;Decreased    Shortness of Breath Yes;Fear of Shortness of Breath;Limiting activity;Panic with Shortness of Breath             Exercise Target Goals: Exercise Program Goal: Individual exercise prescription set using results from initial 6 min walk test and THRR while considering  patient's activity barriers and safety.   Exercise Prescription Goal: Initial exercise prescription builds to 30-45 minutes a day of aerobic activity, 2-3 days per week.  Home exercise guidelines will be given to patient during program as part of exercise prescription that the  participant will acknowledge.  Activity Barriers & Risk Stratification:  Activity Barriers & Cardiac Risk Stratification - 02/18/24 0941       Activity Barriers & Cardiac Risk Stratification   Activity Barriers Deconditioning;Muscular Weakness;Shortness of Breath;Arthritis;Balance Concerns    Cardiac Risk Stratification Moderate             6 Minute Walk:  6 Minute Walk     Row Name 02/18/24 1027         6 Minute Walk   Phase Initial     Distance 1295 feet     Walk Time 6 minutes     # of Rest Breaks 0     MPH 2.45     METS 2.66     RPE 11     Perceived Dyspnea  1     VO2 Peak 9.32     Symptoms No     Resting HR 89 bpm     Resting BP 111/65     Resting Oxygen Saturation  98 %     Exercise Oxygen Saturation  during 6 min walk 95 %     Max Ex. HR 106 bpm     Max Ex. BP 128/70     2 Minute Post BP 102/60       Interval HR   1 Minute HR 97     2 Minute HR 105     3 Minute HR 101     4 Minute HR 101     5 Minute HR 106     6 Minute HR 106     2 Minute Post HR 83     Interval Heart Rate? Yes       Interval Oxygen   Interval Oxygen? Yes     Baseline Oxygen Saturation % 98 %     1 Minute Oxygen Saturation % 97 %     1 Minute Liters of Oxygen  0 L     2 Minute Oxygen Saturation % 95 %     2 Minute Liters of Oxygen 0 L     3 Minute Oxygen Saturation % 97 %     3 Minute Liters of Oxygen 0 L     4 Minute Oxygen Saturation % 95 %     4 Minute Liters of Oxygen 0 L     5 Minute Oxygen Saturation % 966 %     5 Minute Liters of Oxygen 0 L     6 Minute Oxygen Saturation % 96 %     6 Minute Liters of Oxygen 0 L     2 Minute Post Oxygen Saturation % 98 %     2 Minute Post Liters of Oxygen 0 L              Oxygen Initial Assessment:  Oxygen Initial Assessment - 02/18/24 0943       Home Oxygen   Home Oxygen Device None    Sleep Oxygen Prescription CPAP    Home Exercise Oxygen Prescription None    Home Resting Oxygen Prescription None      Initial 6 min  Walk   Oxygen Used None      Program Oxygen Prescription   Program Oxygen Prescription None      Intervention   Short Term Goals To learn and understand importance of monitoring SPO2 with pulse oximeter and demonstrate accurate use of the pulse oximeter.;To learn and understand importance of maintaining oxygen saturations>88%;To learn and demonstrate proper pursed lip breathing techniques or other breathing techniques. ;To learn and demonstrate proper use of respiratory medications    Long  Term Goals Maintenance of O2 saturations>88%;Compliance with respiratory medication;Verbalizes importance of monitoring SPO2 with pulse oximeter and return demonstration;Exhibits proper breathing techniques, such as pursed lip breathing or other method taught during program session;Demonstrates proper use of MDI's             Oxygen Re-Evaluation:  Oxygen Re-Evaluation     Row Name 02/23/24 0849             Program Oxygen Prescription   Program Oxygen Prescription None         Home Oxygen   Home Oxygen Device None       Sleep Oxygen Prescription CPAP       Home Exercise Oxygen Prescription None       Home Resting Oxygen Prescription None         Goals/Expected Outcomes   Short Term Goals To learn and understand importance of monitoring SPO2 with pulse oximeter and demonstrate accurate use of the pulse oximeter.;To learn and understand importance of maintaining oxygen saturations>88%;To learn and demonstrate proper pursed lip breathing techniques or other breathing techniques. ;To learn and demonstrate proper use of respiratory medications       Long  Term Goals Maintenance of O2 saturations>88%;Compliance with respiratory medication;Verbalizes importance of monitoring SPO2 with pulse oximeter and return demonstration;Exhibits proper breathing techniques, such as pursed lip breathing or other method taught during program session;Demonstrates proper use of MDI's       Goals/Expected Outcomes  Compliance and understanding of oxygen saturation monitoring and breathing techniques to decrease shortness of breath.                Oxygen Discharge (Final Oxygen Re-Evaluation):  Oxygen Re-Evaluation - 02/23/24 0849       Program Oxygen Prescription   Program Oxygen Prescription None  Home Oxygen   Home Oxygen Device None    Sleep Oxygen Prescription CPAP    Home Exercise Oxygen Prescription None    Home Resting Oxygen Prescription None      Goals/Expected Outcomes   Short Term Goals To learn and understand importance of monitoring SPO2 with pulse oximeter and demonstrate accurate use of the pulse oximeter.;To learn and understand importance of maintaining oxygen saturations>88%;To learn and demonstrate proper pursed lip breathing techniques or other breathing techniques. ;To learn and demonstrate proper use of respiratory medications    Long  Term Goals Maintenance of O2 saturations>88%;Compliance with respiratory medication;Verbalizes importance of monitoring SPO2 with pulse oximeter and return demonstration;Exhibits proper breathing techniques, such as pursed lip breathing or other method taught during program session;Demonstrates proper use of MDI's    Goals/Expected Outcomes Compliance and understanding of oxygen saturation monitoring and breathing techniques to decrease shortness of breath.             Initial Exercise Prescription:  Initial Exercise Prescription - 02/18/24 1000       Date of Initial Exercise RX and Referring Provider   Date 02/18/24    Referring Provider Ivey Marlin   Expected Discharge Date 05/16/24      Treadmill   MPH 2.5    Grade 0    Minutes 15    METs 2.91      Recumbant Elliptical   Level 2    RPM 253    Watts 50    Minutes 15    METs 2.5      Prescription Details   Frequency (times per week) 2    Duration Progress to 30 minutes of continuous aerobic without signs/symptoms of physical distress      Intensity   THRR  40-80% of Max Heartrate 57-114    Ratings of Perceived Exertion 11-13    Perceived Dyspnea 0-4      Progression   Progression Continue to progress workloads to maintain intensity without signs/symptoms of physical distress.      Resistance Training   Training Prescription Yes    Weight blue bands    Reps 10-15             Perform Capillary Blood Glucose checks as needed.  Exercise Prescription Changes:   Exercise Prescription Changes     Row Name 02/29/24 1200             Response to Exercise   Blood Pressure (Admit) 104/58       Blood Pressure (Exercise) 128/56       Blood Pressure (Exit) 96/58       Heart Rate (Admit) 78 bpm       Heart Rate (Exercise) 116 bpm       Heart Rate (Exit) 92 bpm       Oxygen Saturation (Admit) 97 %       Oxygen Saturation (Exercise) 95 %       Oxygen Saturation (Exit) 94 %       Rating of Perceived Exertion (Exercise) 13       Perceived Dyspnea (Exercise) 3       Duration Continue with 30 min of aerobic exercise without signs/symptoms of physical distress.       Intensity THRR unchanged         Resistance Training   Training Prescription Yes       Weight blue bands       Reps 10-15       Time  10 Minutes         Treadmill   MPH 2.5       Grade 1       Minutes 15       METs 3.1         Recumbant Elliptical   Level 3       RPM 64       Watts 92       Minutes 15       METs 4.3                Exercise Comments:   Exercise Comments     Row Name 02/22/24 1528           Exercise Comments Pt completed first day of exercise. Jmichael averaged 2.7 METs at 2.4 mph on the treadmill and 5.4 METs at level 2 on the recumbent elliptical. He performed the warmup and cooldown standing without limitations. Discussed METs.                Exercise Goals and Review:   Exercise Goals     Row Name 02/18/24 [phone]             Exercise Goals   Increase Physical Activity Yes       Intervention Provide advice, education,  support and counseling about physical activity/exercise needs.;Develop an individualized exercise prescription for aerobic and resistive training based on initial evaluation findings, risk stratification, comorbidities and participant's personal goals.       Expected Outcomes Short Term: Attend rehab on a regular basis to increase amount of physical activity.;Long Term: Add in home exercise to make exercise part of routine and to increase amount of physical activity.;Long Term: Exercising regularly at least 3-5 days a week.       Increase Strength and Stamina Yes       Intervention Provide advice, education, support and counseling about physical activity/exercise needs.;Develop an individualized exercise prescription for aerobic and resistive training based on initial evaluation findings, risk stratification, comorbidities and participant's personal goals.       Expected Outcomes Short Term: Increase workloads from initial exercise prescription for resistance, speed, and METs.;Short Term: Perform resistance training exercises routinely during rehab and add in resistance training at home;Long Term: Improve cardiorespiratory fitness, muscular endurance and strength as measured by increased METs and functional capacity ( )       Able to understand and use rate of perceived exertion (RPE) scale Yes       Intervention Provide education and explanation on how to use RPE scale       Expected Outcomes Short Term: Able to use RPE daily in rehab to express subjective intensity level;Long Term:  Able to use RPE to guide intensity level when exercising independently       Able to understand and use Dyspnea scale Yes       Intervention Provide education and explanation on how to use Dyspnea scale       Expected Outcomes Short Term: Able to use Dyspnea scale daily in rehab to express subjective sense of shortness of breath during exertion;Long Term: Able to use Dyspnea scale to guide intensity level when exercising  independently       Knowledge and understanding of Target Heart Rate Range (THRR) Yes       Intervention Provide education and explanation of THRR including how the numbers were predicted and where they are located for reference       Expected Outcomes Short Term: Able  to state/look up THRR;Long Term: Able to use THRR to govern intensity when exercising independently;Short Term: Able to use daily as guideline for intensity in rehab       Understanding of Exercise Prescription Yes       Intervention Provide education, explanation, and written materials on patient's individual exercise prescription       Expected Outcomes Short Term: Able to explain program exercise prescription;Long Term: Able to explain home exercise prescription to exercise independently                Exercise Goals Re-Evaluation :  Exercise Goals Re-Evaluation     Row Name 02/23/24 0842             Exercise Goal Re-Evaluation   Exercise Goals Review Increase Physical Activity;Able to understand and use Dyspnea scale;Understanding of Exercise Prescription;Increase Strength and Stamina;Knowledge and understanding of Target Heart Rate Range (THRR);Able to understand and use rate of perceived exertion (RPE) scale       Comments Anquan has completed 1 exercise session. He averages 2.7 METs at 2.4 mph on the treadmill and 5.4 METs at level 2 on the recumbent elliptical. Fred performs the warmup and cooldown standing without limitations. It is too soon to notate any discernable progressions. Will continue to monitor and progress as able.       Expected Outcomes Through exercise at rehab and home, the patient will decrease shortness of breath with daily activities and feel confident carrying out an exercise regimn at home.                Discharge Exercise Prescription (Final Exercise Prescription Changes):  Exercise Prescription Changes - 02/29/24 1200       Response to Exercise   Blood Pressure (Admit) 104/58     Blood Pressure (Exercise) 128/56    Blood Pressure (Exit) 96/58    Heart Rate (Admit) 78 bpm    Heart Rate (Exercise) 116 bpm    Heart Rate (Exit) 92 bpm    Oxygen Saturation (Admit) 97 %    Oxygen Saturation (Exercise) 95 %    Oxygen Saturation (Exit) 94 %    Rating of Perceived Exertion (Exercise) 13    Perceived Dyspnea (Exercise) 3    Duration Continue with 30 min of aerobic exercise without signs/symptoms of physical distress.    Intensity THRR unchanged      Resistance Training   Training Prescription Yes    Weight blue bands    Reps 10-15    Time 10 Minutes      Treadmill   MPH 2.5    Grade 1    Minutes 15    METs 3.1      Recumbant Elliptical   Level 3    RPM 64    Watts 92    Minutes 15    METs 4.3             Nutrition:  Target Goals: Understanding of nutrition guidelines, daily intake of sodium 1500mg , cholesterol 200mg , calories 30% from fat and 7% or less from saturated fats, daily to have 5 or more servings of fruits and vegetables.  Biometrics:    Nutrition Therapy Plan and Nutrition Goals:  Nutrition Therapy & Goals - 02/24/24 1102       Nutrition Therapy   Diet General Healthy Diet    Drug/Food Interactions Statins/Certain Fruits      Personal Nutrition Goals   Nutrition Goal Patient to identify strategies for weight gain of 0.5-2.0# per week.  Comments Naoki has medical history of HTN, CAD, OSA, dementia, CKD3, hyperlipidemia, alcohol abuse. He is motivated to gain ~5# to 170#. We discussed strategies for weight gain including increasing eating frequency, etc. He continues to complete group therapy weekly through the [place]. Cain will continue to benefit from participation in pulmonary rehab for nutrition, exercise, and lifestyle modification.      Intervention Plan   Intervention Prescribe, educate and counsel regarding individualized specific dietary modifications aiming towards targeted core components such as weight, hypertension,  lipid management, diabetes, heart failure and other comorbidities.;Nutrition handout(s) given to patient.    Expected Outcomes Short Term Goal: Understand basic principles of dietary content, such as calories, fat, sodium, cholesterol and nutrients.;Long Term Goal: Adherence to prescribed nutrition plan.             Nutrition Assessments:  MEDIFICTS Score Key: >=70 Need to make dietary changes  40-70 Heart Healthy Diet <= 40 Therapeutic Level Cholesterol Diet  Flowsheet Row PULMONARY REHAB CHRONIC OBSTRUCTIVE PULMONARY DISEASE from 07/17/2021 in [facility] Heart, Vascular, & Lung Health  Picture Your Plate Total Score on Discharge 53      Picture Your Plate Scores: <16 Unhealthy dietary pattern with much room for improvement. 41-50 Dietary pattern unlikely to meet recommendations for good health and room for improvement. 51-60 More healthful dietary pattern, with some room for improvement.  >60 Healthy dietary pattern, although there may be some specific behaviors that could be improved.    Nutrition Goals Re-Evaluation:  Nutrition Goals Re-Evaluation     Row Name 02/24/24 1102             Goals   Current Weight 163 lb 12.8 oz (74.3 kg)       Comment Thiamine WNL, HDL 34, LDL 59,A1C 6.0, GFR 49, Cr 1.38       Expected Outcome Rommie has medical history of HTN, CAD, OSA, dementia, CKD3, hyperlipidemia, alcohol abuse. He is motivated to gain ~5# to 170#. We discussed strategies for weight gain including increasing eating frequency, etc. He continues to complete group therapy weekly through the [place]. Darryl will continue to benefit from participation in pulmonary rehab for nutrition, exercise, and lifestyle modification.                Nutrition Goals Discharge (Final Nutrition Goals Re-Evaluation):  Nutrition Goals Re-Evaluation - 02/24/24 1102       Goals   Current Weight 163 lb 12.8 oz (74.3 kg)    Comment Thiamine WNL, HDL 34, LDL 59,A1C  6.0, GFR 49, Cr 1.38    Expected Outcome Rayson has medical history of HTN, CAD, OSA, dementia, CKD3, hyperlipidemia, alcohol abuse. He is motivated to gain ~5# to 170#. We discussed strategies for weight gain including increasing eating frequency, etc. He continues to complete group therapy weekly through the [place]. Jerame will continue to benefit from participation in pulmonary rehab for nutrition, exercise, and lifestyle modification.             Psychosocial: Target Goals: Acknowledge presence or absence of significant depression and/or stress, maximize coping skills, provide positive support system. Participant is able to verbalize types and ability to use techniques and skills needed for reducing stress and depression.  Initial Review & Psychosocial Screening:  Initial Psych Review & Screening - 02/18/24 0934       Initial Review   Current issues with None Identified      Family Dynamics   Good Support System? Yes  Barriers   Psychosocial barriers to participate in program There are no identifiable barriers or psychosocial needs.      Screening Interventions   Interventions Encouraged to exercise             Quality of Life Scores:  Scores of 19 and below usually indicate a poorer quality of life in these areas.  A difference of  2-3 points is a clinically meaningful difference.  A difference of 2-3 points in the total score of the Quality of Life Index has been associated with significant improvement in overall quality of life, self-image, physical symptoms, and general health in studies assessing change in quality of life.  PHQ-9: Review Flowsheet  More data exists      02/18/2024 12/22/2023 11/16/2023 06/21/2023 06/18/2023  Depression screen PHQ 2/9  Decreased Interest 0 0 0 1 1  Down, Depressed, Hopeless 0 0 0 0 0  PHQ - 2 Score 0 0 0 1 1  Altered sleeping 1 - - - 1  Tired, decreased energy 1 - - - 1  Change in appetite 1 - - - 1  Feeling bad or failure about  yourself  0 - - - 0  Trouble concentrating 0 - - - 1  Moving slowly or fidgety/restless 0 - - - 0  Suicidal thoughts 0 - - - 0  PHQ-9 Score 3 - - - 5  Difficult doing work/chores Somewhat difficult - - - Not difficult at all   Interpretation of Total Score  Total Score Depression Severity:  1-4 = Minimal depression, 5-9 = Mild depression, 10-14 = Moderate depression, 15-19 = Moderately severe depression, 20-27 = Severe depression   Psychosocial Evaluation and Intervention:  Psychosocial Evaluation - 02/18/24 1002       Psychosocial Evaluation & Interventions   Interventions Encouraged to exercise with the program and follow exercise prescription    Comments No psychosocial concerns identified at this time    Expected Outcomes For Manases to participate in PR free of any psychosocial barriers or concerns    Continue Psychosocial Services  No Follow up required             Psychosocial Re-Evaluation:  Psychosocial Re-Evaluation     Row Name 02/21/24 [phone]             Psychosocial Re-Evaluation   Current issues with None Identified       Comments Demaree is scheduled to start PR on 02/22/24. No new psychosocial barriers or concerns since orientation on 02/18/24.       Expected Outcomes For Treyden to participate in PR free of any psychosocial barriers or concerns       Interventions Encouraged to attend Pulmonary Rehabilitation for the exercise       Continue Psychosocial Services  No Follow up required                Psychosocial Discharge (Final Psychosocial Re-Evaluation):  Psychosocial Re-Evaluation - 02/21/24 0926       Psychosocial Re-Evaluation   Current issues with None Identified    Comments Chisum is scheduled to start PR on 02/22/24. No new psychosocial barriers or concerns since orientation on 02/18/24.    Expected Outcomes For Kalden to participate in PR free of any psychosocial barriers or concerns    Interventions Encouraged to attend Pulmonary Rehabilitation  for the exercise    Continue Psychosocial Services  No Follow up required             Education: Education  Goals: Education classes will be provided on a weekly basis, covering required topics. Participant will state understanding/return demonstration of topics presented.  Learning Barriers/Preferences:  Learning Barriers/Preferences - 02/18/24 0936       Learning Barriers/Preferences   Learning Barriers Sight   wears glasses   Learning Preferences Audio;Computer/Internet;Group Instruction;Individual Instruction;Pictoral;Skilled Demonstration;Written Material             Education Topics: Know Your Numbers Group instruction that is supported by a PowerPoint presentation. Instructor discusses importance of knowing and understanding resting, exercise, and post-exercise oxygen saturation, heart rate, and blood pressure. Oxygen saturation, heart rate, blood pressure, rating of perceived exertion, and dyspnea are reviewed along with a normal range for these values.    Exercise for the Pulmonary Patient Group instruction that is supported by a PowerPoint presentation. Instructor discusses benefits of exercise, core components of exercise, frequency, duration, and intensity of an exercise routine, importance of utilizing pulse oximetry during exercise, safety while exercising, and options of places to exercise outside of rehab.  Flowsheet Row PULMONARY REHAB CHRONIC OBSTRUCTIVE PULMONARY DISEASE from 07/17/2021 in [facility] for Heart, Vascular, & Lung Health  Date 06/12/21  Educator Jess-Handout  [METS]  Instruction Review Code 1- Verbalizes Understanding       MET Level  Group instruction provided by PowerPoint, verbal discussion, and written material to support subject matter. Instructor reviews what METs are and how to increase METs.    Pulmonary Medications Verbally interactive group education provided by instructor with focus on inhaled medications  and proper administration. Flowsheet Row PULMONARY REHAB CHRONIC OBSTRUCTIVE PULMONARY DISEASE from 07/17/2021 in [facility] for Heart, Vascular, & Lung Health  Date 06/19/21  Educator Handout       Anatomy and Physiology of the Respiratory System Group instruction provided by PowerPoint, verbal discussion, and written material to support subject matter. Instructor reviews respiratory cycle and anatomical components of the respiratory system and their functions. Instructor also reviews differences in obstructive and restrictive respiratory diseases with examples of each.  Flowsheet Row PULMONARY REHAB CHRONIC OBSTRUCTIVE PULMONARY DISEASE from 07/17/2021 in [facility] for Heart, Vascular, & Lung Health  Date 06/26/21  Educator Handout       Oxygen Safety Group instruction provided by PowerPoint, verbal discussion, and written material to support subject matter. There is an overview of "What is Oxygen" and "Why do we need it".  Instructor also reviews how to create a safe environment for oxygen use, the importance of using oxygen as prescribed, and the risks of noncompliance. There is a brief discussion on traveling with oxygen and resources the patient may utilize. Flowsheet Row PULMONARY REHAB CHRONIC OBSTRUCTIVE PULMONARY DISEASE from 02/24/2024 in [facility] for Heart, Vascular, & Lung Health  Date 02/24/24  Educator RN  Instruction Review Code 1- Verbalizes Understanding       Oxygen Use Group instruction provided by PowerPoint, verbal discussion, and written material to discuss how supplemental oxygen is prescribed and different types of oxygen supply systems. Resources for more information are provided.    Breathing Techniques Group instruction that is supported by demonstration and informational handouts. Instructor discusses the benefits of pursed lip and diaphragmatic breathing and detailed demonstration on  how to perform both.     Risk Factor Reduction Group instruction that is supported by a PowerPoint presentation. Instructor discusses the definition of a risk factor, different risk factors for pulmonary disease, and how the heart and lungs work together.  Pulmonary Diseases Group instruction provided by PowerPoint, verbal discussion, and written material to support subject matter. Instructor gives an overview of the different type of pulmonary diseases. There is also a discussion on risk factors and symptoms as well as ways to manage the diseases.   Stress and Energy Conservation Group instruction provided by PowerPoint, verbal discussion, and written material to support subject matter. Instructor gives an overview of stress and the impact it can have on the body. Instructor also reviews ways to reduce stress. There is also a discussion on energy conservation and ways to conserve energy throughout the day.   Warning Signs and Symptoms Group instruction provided by PowerPoint, verbal discussion, and written material to support subject matter. Instructor reviews warning signs and symptoms of stroke, heart attack, cold and flu. Instructor also reviews ways to prevent the spread of infection.   Other Education Group or individual verbal, written, or video instructions that support the educational goals of the pulmonary rehab program. Flowsheet Row PULMONARY REHAB CHRONIC OBSTRUCTIVE PULMONARY DISEASE from 07/17/2021 in [facility] for Heart, Vascular, & Lung Health  Date 07/17/21  Educator Handout  [How to beat a sedentary lifestyle]        Knowledge Questionnaire Score:  Knowledge Questionnaire Score - 02/18/24 1000       Knowledge Questionnaire Score   Pre Score 14/18             Core Components/Risk Factors/Patient Goals at Admission:  Personal Goals and Risk Factors at Admission - 02/18/24 0937       Core Components/Risk Factors/Patient Goals on  Admission    Weight Management Yes;Weight Gain    Intervention Weight Management: Develop a combined nutrition and exercise program designed to reach desired caloric intake, while maintaining appropriate intake of nutrient and fiber, sodium and fats, and appropriate energy expenditure required for the weight goal.;Weight Management: Provide education and appropriate resources to help participant work on and attain dietary goals.;Weight Management/Obesity: Establish reasonable short term and long term weight goals.;Obesity: Provide education and appropriate resources to help participant work on and attain dietary goals.    Admit Weight 163 lb 12.8 oz (74.3 kg)    Expected Outcomes Short Term: Continue to assess and modify interventions until short term weight is achieved;Long Term: Adherence to nutrition and physical activity/exercise program aimed toward attainment of established weight goal;Weight Gain: Understanding of general recommendations for a high calorie, high protein meal plan that promotes weight gain by distributing calorie intake throughout the day with the consumption for 4-5 meals, snacks, and/or supplements;Understanding recommendations for meals to include 15-35% energy as protein, 25-35% energy from fat, 35-60% energy from carbohydrates, less than 200mg  of dietary cholesterol, 20-35 gm of total fiber daily;Understanding of distribution of calorie intake throughout the day with the consumption of 4-5 meals/snacks    Improve shortness of breath with ADL's Yes    Intervention Provide education, individualized exercise plan and daily activity instruction to help decrease symptoms of SOB with activities of daily living.    Expected Outcomes Short Term: Improve cardiorespiratory fitness to achieve a reduction of symptoms when performing ADLs;Long Term: Be able to perform more ADLs without symptoms or delay the onset of symptoms             Core Components/Risk Factors/Patient Goals Review:    Goals and Risk Factor Review     Row Name 02/21/24 0928             Core Components/Risk Factors/Patient Goals Review  Personal Goals Review Weight Management/Obesity;Improve shortness of breath with ADL's;Develop more efficient breathing techniques such as purse lipped breathing and diaphragmatic breathing and practicing self-pacing with activity.       Review Nickolis is scheduled to start PR on 02/22/24. Unable to assess his goals at this time. We will continue to monitor his progress throughout the program.       Expected Outcomes To improve shortness of breath with ADL's, develop more efficient breathing techniques such as purse lipped breathing and diaphragmatic breathing; and practicing self-pacing with activity and gain weight.                Core Components/Risk Factors/Patient Goals at Discharge (Final Review):   Goals and Risk Factor Review - 02/21/24 0928       Core Components/Risk Factors/Patient Goals Review   Personal Goals Review Weight Management/Obesity;Improve shortness of breath with ADL's;Develop more efficient breathing techniques such as purse lipped breathing and diaphragmatic breathing and practicing self-pacing with activity.    Review Stefon is scheduled to start PR on 02/22/24. Unable to assess his goals at this time. We will continue to monitor his progress throughout the program.    Expected Outcomes To improve shortness of breath with ADL's, develop more efficient breathing techniques such as purse lipped breathing and diaphragmatic breathing; and practicing self-pacing with activity and gain weight.             ITP Comments: Pt is making expected progress toward Pulmonary Rehab goals after completing 3 session(s). Recommend continued exercise, life style modification, education, and utilization of breathing techniques to increase stamina and strength, while also decreasing shortness of breath with exertion.  Dr. Genetta Kenning is Medical Director for  Pulmonary Rehab at [facility].

## 2024-03-02 ENCOUNTER — Encounter (HOSPITAL_COMMUNITY)
Admission: RE | Admit: 2024-03-02 | Discharge: 2024-03-02 | Disposition: A | Discharge status: Home / Self Care | Source: Ambulatory Visit | Attending: Pulmonary Disease | Admitting: Pulmonary Disease

## 2024-03-02 DIAGNOSIS — J449 Chronic obstructive pulmonary disease, unspecified: Secondary | ICD-10-CM

## 2024-03-02 NOTE — Progress Notes (Signed)
 Daily Session Note  Patient Details  Name: Ryan Diaz MRN: [id number] Date of Birth: 1946/08/12 Referring Provider:   [name] Pulmonary Rehab Walk Test from 02/18/2024 in [facility] for Heart, Vascular, & Lung Health  Referring Provider [name]       Encounter Date: 03/02/2024  Check In:  Session Check In - 03/02/24 1023       Check-In   Supervising physician immediately available to respond to emergencies [facility] MD immediately available    Physician(s) Charles Connor, NP    Location [facility]    Staff Present Atlas Lea, MS, ACSM-CEP, Exercise Physiologist;Mary Arlester Ladd, RN, BSN;Casey Felipe Horton, RT;Randi Regis Captain BS, ACSM-CEP, Exercise Physiologist;Bailey Martina Sledge, MS, Exercise Physiologist;Johnny Alexia Angelucci, MS, Exercise Physiologist    Virtual Visit No    Medication changes reported     No    Fall or balance concerns reported    No    Tobacco Cessation No Change    Warm-up and Cool-down Performed as group-led instruction    Resistance Training Performed Yes    VAD Patient? No    PAD/SET Patient? No      Pain Assessment   Currently in Pain? No/denies    Multiple Pain Sites No             Capillary Blood Glucose: No results found for this or any previous visit (from the past 24 hours).    Social History   Tobacco Use  Smoking Status Former   Current packs/day: 0.00   Average packs/day: 1.5 packs/day for 35.0 years (52.5 ttl pk-yrs)   Types: Cigarettes   Start date: 09/04/1974   Quit date: 09/04/2009   Years since quitting: 14.5  Smokeless Tobacco Never    Goals Met:  Proper associated with RPD/PD & O2 Sat Exercise tolerated well No report of concerns or symptoms today Strength training completed today  Goals Unmet:  Not Applicable  Comments: Service time is from 1006 to 1138.    Dr. Genetta Kenning is Medical Director for Pulmonary Rehab at [facility].

## 2024-03-07 ENCOUNTER — Encounter (HOSPITAL_COMMUNITY)
Admission: RE | Admit: 2024-03-07 | Discharge: 2024-03-07 | Disposition: A | Discharge status: Home / Self Care | Source: Ambulatory Visit | Attending: Pulmonary Disease | Admitting: Pulmonary Disease

## 2024-03-07 DIAGNOSIS — J449 Chronic obstructive pulmonary disease, unspecified: Secondary | ICD-10-CM | POA: Diagnosis not present

## 2024-03-07 NOTE — Progress Notes (Signed)
 Daily Session Note  Patient Details  Name: Ryan Diaz MRN: [id number] Date of Birth: 03/17/1946 Referring Provider:   [name] Pulmonary Rehab Walk Test from 02/18/2024 in [facility] for Heart, Vascular, & Lung Health  Referring Provider [name]       Encounter Date: 03/07/2024  Check In:  Session Check In - 03/07/24 1025       Check-In   Supervising physician immediately available to respond to emergencies [facility] MD immediately available    Physician(s) Marlana Silvan, NP    Location [facility]    Staff Present Atlas Lea, MS, ACSM-CEP, Exercise Physiologist;Casey Mayme Spearman BS, ACSM-CEP, Exercise Physiologist;Bailey Martina Sledge, MS, Exercise Physiologist;Sarah Perri Brake, RN, MSN;Samantha Belarus, RD, LDN    Virtual Visit No    Medication changes reported     No    Fall or balance concerns reported    No    Tobacco Cessation No Change    Warm-up and Cool-down Performed as group-led instruction    Resistance Training Performed Yes    VAD Patient? No    PAD/SET Patient? No      Pain Assessment   Currently in Pain? No/denies    Multiple Pain Sites No             Capillary Blood Glucose: No results found for this or any previous visit (from the past 24 hours).    Social History   Tobacco Use  Smoking Status Former   Current packs/day: 0.00   Average packs/day: 1.5 packs/day for 35.0 years (52.5 ttl pk-yrs)   Types: Cigarettes   Start date: 09/04/1974   Quit date: 09/04/2009   Years since quitting: 14.5  Smokeless Tobacco Never    Goals Met:  Proper associated with RPD/PD & O2 Sat Exercise tolerated well No report of concerns or symptoms today Strength training completed today  Goals Unmet:  Not Applicable  Comments: Service time is from 1010 to 1131.    Dr. Genetta Kenning is Medical Director for Pulmonary Rehab at [facility].

## 2024-03-09 ENCOUNTER — Encounter (HOSPITAL_COMMUNITY)
Admission: RE | Admit: 2024-03-09 | Discharge: 2024-03-09 | Disposition: A | Discharge status: Home / Self Care | Source: Ambulatory Visit | Attending: Pulmonary Disease | Admitting: Pulmonary Disease

## 2024-03-09 DIAGNOSIS — J449 Chronic obstructive pulmonary disease, unspecified: Secondary | ICD-10-CM

## 2024-03-09 NOTE — Progress Notes (Signed)
 Daily Session Note  Patient Details  Name: Ryan Diaz MRN: [id number] Date of Birth: [birth year] Referring Provider:   [name] Pulmonary Rehab Walk Test from 02/18/2024 in [facility] for Heart, Vascular, & Lung Health  Referring Provider [name]       Encounter Date: 03/09/2024  Check In:  Session Check In - 03/09/24 1027       Check-In   Supervising physician immediately available to respond to emergencies [facility] immediately available    Physician(s) Marlana Silvan, NP    [facility]    Staff Present Atlas Lea, MS, ACSM-CEP, Exercise Physiologist;Casey Mayme Spearman BS, ACSM-CEP, Exercise Physiologist;Mary Arlester Ladd, RN, BSN    Virtual Visit No    Medication changes reported     No    Fall or balance concerns reported    No    Tobacco Cessation No Change    Warm-up and Cool-down Performed as group-led instruction    Resistance Training Performed Yes    VAD Patient? No    PAD/SET Patient? No      Pain Assessment   Currently in Pain? No/denies    Multiple Pain Sites No             Capillary Blood Glucose: No results found for this or any previous visit (from the past 24 hours).    Social History   Tobacco Use  Smoking Status Former   Current packs/day: 0.00   Average packs/day: 1.5 packs/day for 35.0 years (52.5 ttl pk-yrs)   Types: Cigarettes   Start date: 09/04/1974   Quit date: 09/04/2009   Years since quitting: 14.5  Smokeless Tobacco Never    Goals Met:  Exercise tolerated well No report of concerns or symptoms today Strength training completed today  Goals Unmet:  Not Applicable  Comments: Service time is from 1009 to 1137.    Dr. Genetta Kenning is Medical Director for Pulmonary Rehab at [facility].

## 2024-03-14 ENCOUNTER — Encounter (HOSPITAL_COMMUNITY)
Admission: RE | Admit: 2024-03-14 | Discharge: 2024-03-14 | Disposition: A | Discharge status: Home / Self Care | Source: Ambulatory Visit | Attending: Pulmonary Disease | Admitting: Pulmonary Disease

## 2024-03-14 VITALS — Wt 164.0 lb

## 2024-03-14 DIAGNOSIS — J449 Chronic obstructive pulmonary disease, unspecified: Secondary | ICD-10-CM

## 2024-03-14 NOTE — Progress Notes (Signed)
 Daily Session Note  Patient Details  Name: Ryan Diaz MRN: [id number] Date of Birth: 09-09-1946 Referring Provider:   [name] Pulmonary Rehab Walk Test from 02/18/2024 in [facility] for Heart, Vascular, & Lung Health  Referring Provider [name]       Encounter Date: 03/14/2024  Check In:  Session Check In - 03/14/24 1046       Check-In   Supervising physician immediately available to respond to emergencies [facility] immediately available    Physician(s) Lawana Pray, NP    [facility]    Staff Present Atlas Lea, MS, ACSM-CEP, Exercise Physiologist;Casey Mayme Spearman BS, ACSM-CEP, Exercise Physiologist;Raelyn Racette Arlester Ladd, RN, BSN    Virtual Visit No    Medication changes reported     No    Fall or balance concerns reported    No    Tobacco Cessation No Change    Warm-up and Cool-down Performed as group-led instruction    Resistance Training Performed Yes    VAD Patient? No    PAD/SET Patient? No      Pain Assessment   Currently in Pain? No/denies    Multiple Pain Sites No             Capillary Blood Glucose: No results found for this or any previous visit (from the past 24 hours).   Exercise Prescription Changes - 03/14/24 1100       Response to Exercise   Blood Pressure (Admit) 112/62    Blood Pressure (Exercise) 122/62    Blood Pressure (Exit) 130/60    Heart Rate (Admit) 70 bpm    Heart Rate (Exercise) 105 bpm    Heart Rate (Exit) 84 bpm    Oxygen  Saturation (Admit) 96 %    Oxygen  Saturation (Exercise) 94 %    Oxygen  Saturation (Exit) 96 %    Rating of Perceived Exertion (Exercise) 10    Perceived Dyspnea (Exercise) 1    Duration Continue with 30 min of aerobic exercise without signs/symptoms of physical distress.    Intensity THRR unchanged      Resistance Training   Training Prescription Yes    Weight blue bands    Reps 10-15    Time 10 Minutes      Treadmill   MPH 2.5     Grade 1.5    Minutes 15    METs 3.41      Recumbant Elliptical   Level 3    RPM 68    Minutes 15    METs 5.1             Social History   Tobacco Use  Smoking Status Former   Current packs/day: 0.00   Average packs/day: 1.5 packs/day for 35.0 years (52.5 ttl pk-yrs)   Types: Cigarettes   Start date: 09/04/1974   Quit date: 09/04/2009   Years since quitting: 14.5  Smokeless Tobacco Never    Goals Met:  Exercise tolerated well No report of concerns or symptoms today Strength training completed today  Goals Unmet:  Not Applicable  Comments: Service time is from 1008 to 1131    Dr. Genetta Kenning is Medical Director for Pulmonary Rehab at [facility].

## 2024-03-16 ENCOUNTER — Encounter (HOSPITAL_COMMUNITY)
Admission: RE | Admit: 2024-03-16 | Discharge: 2024-03-16 | Disposition: A | Discharge status: Home / Self Care | Source: Ambulatory Visit | Attending: Pulmonary Disease | Admitting: Pulmonary Disease

## 2024-03-16 DIAGNOSIS — J449 Chronic obstructive pulmonary disease, unspecified: Secondary | ICD-10-CM | POA: Insufficient documentation

## 2024-03-16 NOTE — Progress Notes (Signed)
 Daily Session Note  Patient Details  Name: Ryan Diaz MRN: [id number] Date of Birth: 12-22-1945 Referring Provider:   [name] Pulmonary Rehab Walk Test from 02/18/2024 in [facility] for Heart, Vascular, & Lung Health  Referring Provider [name]       Encounter Date: 03/16/2024  Check In:  Session Check In - 03/16/24 1029       Check-In   Supervising physician immediately available to respond to emergencies [facility] MD immediately available    Physician(s) Koren Persons, NP    Location [facility]    Staff Present Atlas Lea, MS, ACSM-CEP, Exercise Physiologist;Riannah Stagner Mayme Spearman BS, ACSM-CEP, Exercise Physiologist;Mary Arlester Ladd, RN, Mercedes Stake, RN, BSN    Virtual Visit No    Medication changes reported     No    Fall or balance concerns reported    No    Tobacco Cessation No Change    Warm-up and Cool-down Performed as group-led Writer Performed Yes    VAD Patient? No    PAD/SET Patient? No      Pain Assessment   Currently in Pain? No/denies    Multiple Pain Sites No             Capillary Blood Glucose: No results found for this or any previous visit (from the past 24 hours).    Social History   Tobacco Use  Smoking Status Former   Current packs/day: 0.00   Average packs/day: 1.5 packs/day for 35.0 years (52.5 ttl pk-yrs)   Types: Cigarettes   Start date: 09/04/1974   Quit date: 09/04/2009   Years since quitting: 14.5  Smokeless Tobacco Never    Goals Met:  Proper associated with RPD/PD & O2 Sat Independence with exercise equipment Exercise tolerated well No report of concerns or symptoms today Strength training completed today  Goals Unmet:  Not Applicable  Comments: Service time is from 1010 to 1139.    Dr. Genetta Kenning is Medical Director for Pulmonary Rehab at [facility].

## 2024-03-17 ENCOUNTER — Telehealth: Payer: Self-pay

## 2024-03-17 NOTE — Telephone Encounter (Signed)
 This patient is appearing on a report for being at risk of failing the adherence measure for cholesterol (statin) and hypertension (ACEi/ARB) medications last calendar year.   Medication: losartan  100 mg daily Last fill date: 06/19/23 for 90 day supply  Medication: atorvastatin  80 mg daily Last fill date: 04/13/23 for 90 day supply  Left voicemail for patient to return my call at their convenience. Next PCP visit 03/23/24.  Abelina Abide, PharmD PGY1 Pharmacy Resident 03/17/2024 4:39 PM

## 2024-03-20 NOTE — Telephone Encounter (Signed)
 Copied from CRM [id number]. Topic: Clinical - Medical Advice >> Mar 20, 2024  8:35 AM Dimple Francis wrote: Reason for CRM: patient returning call for Abelina Abide, PharmD. Please call him when available

## 2024-03-21 ENCOUNTER — Encounter (HOSPITAL_COMMUNITY)
Admission: RE | Admit: 2024-03-21 | Discharge: 2024-03-21 | Disposition: A | Discharge status: Home / Self Care | Source: Ambulatory Visit | Attending: Pulmonary Disease

## 2024-03-21 DIAGNOSIS — J449 Chronic obstructive pulmonary disease, unspecified: Secondary | ICD-10-CM | POA: Diagnosis not present

## 2024-03-21 NOTE — Progress Notes (Signed)
 Daily Session Note  Patient Details  Name: Ryan Diaz MRN: [id number] Date of Birth: Apr 03, 1946 Referring Provider:   [name] Pulmonary Rehab Walk Test from 02/18/2024 in [facility] for Heart, Vascular, & Lung Health  Referring Provider [name]       Encounter Date: 03/21/2024  Check In:  Session Check In - 03/21/24 1016       Check-In   Supervising physician immediately available to respond to emergencies [facility] MD immediately available    Physician(s) Slater Duncan, NP    Location [facility]    Staff Present Atlas Lea, MS, ACSM-CEP, Exercise Physiologist;Apolonia Ellwood Mayme Spearman BS, ACSM-CEP, Exercise Physiologist;Mary Arlester Ladd, RN, BSN;Johnny Porter, MS, Exercise Physiologist    Virtual Visit No    Medication changes reported     No    Fall or balance concerns reported    No    Tobacco Cessation No Change    Warm-up and Cool-down Performed as group-led instruction    Resistance Training Performed Yes    VAD Patient? No    PAD/SET Patient? No      Pain Assessment   Currently in Pain? No/denies    Multiple Pain Sites No             Capillary Blood Glucose: No results found for this or any previous visit (from the past 24 hours).    Social History   Tobacco Use  Smoking Status Former   Current packs/day: 0.00   Average packs/day: 1.5 packs/day for 35.0 years (52.5 ttl pk-yrs)   Types: Cigarettes   Start date: 09/04/1974   Quit date: 09/04/2009   Years since quitting: 14.5  Smokeless Tobacco Never    Goals Met:  Proper associated with RPD/PD & O2 Sat Independence with exercise equipment Exercise tolerated well No report of concerns or symptoms today Strength training completed today  Goals Unmet:  Not Applicable  Comments: Service time is from 1008 to 1134.    Dr. Genetta Kenning is Medical Director for Pulmonary Rehab at [facility].

## 2024-03-22 NOTE — Telephone Encounter (Signed)
 This patient is appearing on a report for being at risk of failing the adherence measure for cholesterol (statin) and hypertension (ACEi/ARB) medications last calendar year.   Medication: atorvastatin  80 mg daily Last fill date at [place]: 03/17/24 for 90 day supply  *No refills left - will go back to [place] for further refills  Medication: losartan  100 mg daily Last fill date at [place]: 01/20/24 for 90 day supply   Insurance report was not up to date as patient gets medications shipped from the [place]. Obtained last fill dates from patient who read from the medication bottles. Patient confirms he takes his medications daily and utilizes a pill box. He does not check BP at home but goes to pulmonary health rehab on Tuesdays and Thursdays where he reports his BP is typically around 108/65. No action needed at this time.  Abelina Abide, PharmD PGY1 Pharmacy Resident 03/22/2024 8:45 AM

## 2024-03-23 ENCOUNTER — Encounter (HOSPITAL_COMMUNITY)
Admission: RE | Admit: 2024-03-23 | Discharge: 2024-03-23 | Disposition: A | Discharge status: Home / Self Care | Source: Ambulatory Visit | Attending: Pulmonary Disease | Admitting: Pulmonary Disease

## 2024-03-23 ENCOUNTER — Ambulatory Visit

## 2024-03-23 VITALS — Ht 70.0 in | Wt 164.8 lb

## 2024-03-23 VITALS — Wt 164.7 lb

## 2024-03-23 DIAGNOSIS — Z122 Encounter for screening for malignant neoplasm of respiratory organs: Secondary | ICD-10-CM | POA: Diagnosis not present

## 2024-03-23 DIAGNOSIS — Z0000 Encounter for general adult medical examination without abnormal findings: Secondary | ICD-10-CM | POA: Diagnosis not present

## 2024-03-23 DIAGNOSIS — J449 Chronic obstructive pulmonary disease, unspecified: Secondary | ICD-10-CM

## 2024-03-23 DIAGNOSIS — Z87891 Personal history of nicotine dependence: Secondary | ICD-10-CM

## 2024-03-23 NOTE — Progress Notes (Signed)
 Daily Session Note  Patient Details  Name: Ryan Diaz MRN: [id number] Date of Birth: 01-Dec-1945 Referring Provider:   [name] Pulmonary Rehab Walk Test from 02/18/2024 in [facility] for Heart, Vascular, & Lung Health  Referring Provider [name]       Encounter Date: 03/23/2024  Check In:  Session Check In - 03/23/24 1114       Check-In   Supervising physician immediately available to respond to emergencies [facility] MD immediately available    Physician(s) Levin Reamer, NP    [facility]    Staff Present Atlas Lea, MS, ACSM-CEP, Exercise Physiologist;Carlethia Mesquita Mayme Spearman BS, ACSM-CEP, Exercise Physiologist;Mary Arlester Ladd, RN, Malvin Searing, MS, ACSM-CEP, CCRP, Exercise Physiologist    Virtual Visit No    Medication changes reported     No    Fall or balance concerns reported    No    Tobacco Cessation No Change    Warm-up and Cool-down Performed as group-led instruction    Resistance Training Performed Yes    VAD Patient? No    PAD/SET Patient? No      Pain Assessment   Currently in Pain? No/denies    Multiple Pain Sites No             Capillary Blood Glucose: No results found for this or any previous visit (from the past 24 hours).    Social History   Tobacco Use  Smoking Status Former   Current packs/day: 0.00   Average packs/day: 1.5 packs/day for 35.0 years (52.5 ttl pk-yrs)   Types: Cigarettes   Start date: 09/04/1974   Quit date: 09/04/2009   Years since quitting: 14.5  Smokeless Tobacco Never    Goals Met:  Proper associated with RPD/PD & O2 Sat Independence with exercise equipment Exercise tolerated well No report of concerns or symptoms today Strength training completed today  Goals Unmet:  Not Applicable  Comments: Service time is from 1005 to 1128.    Dr. Genetta Kenning is Medical Director for Pulmonary Rehab at [facility].

## 2024-03-23 NOTE — Progress Notes (Signed)
 Subjective:   Ryan Diaz is a 78 y.o. who presents for a Medicare Wellness preventive visit.  Visit Complete: Virtual I connected with  Ryan Diaz on 03/23/24 by a audio enabled telemedicine application and verified that I am speaking with the correct person using two identifiers.  Patient Location: Home  Provider Location: Office/Clinic  I discussed the limitations of evaluation and management by telemedicine. The patient expressed understanding and agreed to proceed.  Vital Signs: Because this visit was a virtual/telehealth visit, some criteria may be missing or patient reported. Any vitals not documented were not able to be obtained and vitals that have been documented are patient reported.  VideoDeclined- This patient declined Librarian, academic. Therefore the visit was completed with audio only.  Persons Participating in Visit: Patient.  AWV Questionnaire: No: Patient Medicare AWV questionnaire was not completed prior to this visit.  Cardiac Risk Factors include: advanced age (>63men, >26 women);dyslipidemia;hypertension;male gender     Objective:     Today's Vitals   03/23/24 1241  Weight: 164 lb 12.8 oz (74.8 kg)  Height: 5\' 10"  (1.778 m)   Body mass index is 23.65 kg/m.     03/23/2024   12:38 PM 02/18/2024    9:32 AM 06/21/2023    8:23 AM 10/07/2021   11:21 AM 05/12/2021   11:17 AM 10/17/2020    6:10 AM 08/23/2020    8:50 AM  Advanced Directives  Does Patient Have a Medical Advance Directive? No No No No No No No  Would patient like information on creating a medical advance directive? Yes (MAU/Ambulatory/Procedural Areas - Information given) No - Patient declined Yes (MAU/Ambulatory/Procedural Areas - Information given) Yes (MAU/Ambulatory/Procedural Areas - Information given)  No - Patient declined No - Patient declined    Current Medications (verified) Outpatient Encounter Medications as of 03/23/2024  Medication Sig   albuterol   (ACCUNEB ) 0.63 MG/3ML nebulizer solution Take 3 mLs (0.63 mg total) by nebulization every 6 (six) hours as needed for wheezing.   albuterol  (VENTOLIN  HFA) 108 (90 Base) MCG/ACT inhaler Inhale 2 puffs into the lungs every 6 (six) hours as needed for wheezing or shortness of breath.   aspirin  EC 81 MG tablet Take 81 mg by mouth daily.   atorvastatin  (LIPITOR) 80 MG tablet Take 1 tablet by mouth once daily   cetirizine  (ZYRTEC ) 10 MG tablet Take by mouth.   Cholecalciferol (VITAMIN D ) 50 MCG (2000 UT) tablet Take 1 tablet (2,000 Units total) by mouth daily.   Cholecalciferol 50 MCG (2000 UT) TABS Take 1 tablet by mouth daily.   ciclesonide (ALVESCO) 160 MCG/ACT inhaler Inhale into the lungs.   clotrimazole  (LOTRIMIN ) 1 % cream Apply 1 application topically 2 (two) times daily as needed (irritation).   clotrimazole -betamethasone  (LOTRISONE ) cream Apply 1 Application topically daily. Use as directed twice per day as needed   cromolyn (OPTICROM) 4 % ophthalmic solution Place 1 drop into both eyes 4 (four) times daily as needed.   cyanocobalamin  (VITAMIN B12) 1000 MCG tablet Take 1 tablet (1,000 mcg total) by mouth daily.   docusate sodium (COLACE) 100 MG capsule Take 100 mg by mouth daily as needed for mild constipation.   ferrous sulfate 324 MG TBEC Take 324 mg by mouth. As needed   fluticasone  (FLONASE ) 50 MCG/ACT nasal spray Place into the nose.   Fluticasone -Umeclidin-Vilant (TRELEGY ELLIPTA ) 100-62.5-25 MCG/ACT AEPB Inhale 1 puff then rinse mouth, once daily   gabapentin  (NEURONTIN ) 100 MG capsule Take 1 capsule (100 mg  total) by mouth 3 (three) times daily.   hydrocortisone 2.5 % cream SMARTSIG:Topical 1-2 Times Daily PRN   ipratropium (ATROVENT ) 0.06 % nasal spray Place 2 sprays into both nostrils 3 (three) times daily.   levocetirizine (XYZAL  ALLERGY 24HR) 5 MG tablet Take 1 tablet (5 mg total) by mouth every evening.   losartan  (COZAAR ) 100 MG tablet Take 1 tablet (100 mg total) by mouth  daily.   meloxicam  (MOBIC ) 15 MG tablet 1 tab by mouth once daily as needed   mirtazapine (REMERON) 7.5 MG tablet Take by mouth.   nitroGLYCERIN  (NITROSTAT ) 0.4 MG SL tablet Place 1 tablet (0.4 mg total) under the tongue every 5 (five) minutes as needed for chest pain.   Omega-3 Fatty Acids (FISH OIL) 1000 MG CAPS Take 1,000 mg by mouth 2 (two) times daily.   pantoprazole (PROTONIX) 40 MG tablet TAKE ONE TABLET BY MOUTH ONCE EVERY DAY TO CONTROL STOMACH ACID   pantoprazole (PROTONIX) 40 MG tablet Take by mouth.   Spacer/Aero-Holding Chambers (AEROCHAMBER MV) inhaler Use as instructed   tadalafil (CIALIS) 20 MG tablet Take 20 mg by mouth daily as needed for erectile dysfunction.   terazosin  (HYTRIN ) 2 MG capsule Take 1 capsule (2 mg total) by mouth at bedtime.   Tiotropium Bromide-Olodaterol 2.5-2.5 MCG/ACT AERS Inhale into the lungs.   traZODone (DESYREL) 50 MG tablet Take 50 mg by mouth at bedtime as needed for sleep.   triamcinolone  (NASACORT ) 55 MCG/ACT AERO nasal inhaler Place 2 sprays into the nose daily.   [DISCONTINUED] Ferrous Sulfate 27 MG TABS Take 27 mg by mouth daily as needed (pt prefrence).   No facility-administered encounter medications on file as of 03/23/2024.    Allergies (verified) Memantine  and Sildenafil    History: Past Medical History:  Diagnosis Date   ABUSE, ALCOHOL, CONTINUOUS 08/08/2007   ALLERGIC RHINITIS 08/08/2007   ANXIETY 08/08/2007   Carpal tunnel syndrome    COPD 10/03/2009   DEPRESSION 08/08/2007   ERECTILE DYSFUNCTION 08/08/2007   GENITAL HERPES, HX OF 08/08/2007   GERD 08/08/2007   GLUCOSE INTOLERANCE 08/08/2007   HYPERLIPIDEMIA 08/08/2007   HYPERTENSION 08/08/2007   Impaired glucose tolerance 09/26/2011   Insomnia 05/18/2012   PAIN IN SOFT TISSUES OF LIMB 10/02/2009   Sleep apnea    OSA-uses CPAP nightly   Past Surgical History:  Procedure Laterality Date   ACHILLES TENDON REPAIR Bilateral    CARPAL TUNNEL RELEASE Right 08/18/2018   Procedure:  RIGHT CARPAL TUNNEL RELEASE;  Surgeon: Brunilda Capra, MD;  Location: [facility];  Service: Orthopedics;  Laterality: Right;   CARPAL TUNNEL RELEASE Left 11/21/2018   Procedure: LEFT CARPAL TUNNEL RELEASE;  Surgeon: Brunilda Capra, MD;  Location: [facility];  Service: Orthopedics;  Laterality: Left;  Bier block   COLONOSCOPY  12/2011   Kaplan-MAC-moviprep(exc)-tics/int hems/normal-10 yr recall   LEFT HEART CATH AND CORONARY ANGIOGRAPHY N/A 10/17/2020   Procedure: LEFT HEART CATH AND CORONARY ANGIOGRAPHY;  Surgeon: Sammy Crisp, MD;  [facility];  Service: Cardiovascular;  Laterality: N/A;   Family History  Problem Relation Age of Onset   Colon polyps Brother 1   Colon cancer Neg Hx    Esophageal cancer Neg Hx    Rectal cancer Neg Hx    Stomach cancer Neg Hx    Social History   Socioeconomic History   Marital status: Single    Spouse name: Not on file   Number of children: 2   Years of education: Not  on file   Highest education level: Bachelor's degree (e.g., BA, AB, BS)  Occupational History   Occupation: self employed Airline pilot work  Tobacco Use   Smoking status: Former    Current packs/day: 0.00    Average packs/day: 1.5 packs/day for 35.0 years (52.5 ttl pk-yrs)    Types: Cigarettes    Start date: 09/04/1974    Quit date: 09/04/2009    Years since quitting: 14.5    Passive exposure: Past   Smokeless tobacco: Never  Vaping Use   Vaping status: Never Used  Substance and Sexual Activity   Alcohol use: Yes    Comment: occasionally   Drug use: Not Currently    Frequency: 1.0 times per week   Sexual activity: Yes  Other Topics Concern   Not on file  Social History Narrative   Lives alone   Social Drivers of Health   Financial Resource Strain: Low Risk  (03/23/2024)   Overall Financial Resource Strain (CARDIA)    Difficulty of Paying Living Expenses: Not hard at all  Food Insecurity: No Food Insecurity (03/23/2024)    Hunger Vital Sign    Worried About Running Out of Food in the Last Year: Never true    Ran Out of Food in the Last Year: Never true  Transportation Needs: No Transportation Needs (03/23/2024)   PRAPARE - Administrator, Civil Service (Medical): No    Lack of Transportation (Non-Medical): No  Physical Activity: Insufficiently Active (03/23/2024)   Exercise Vital Sign    Days of Exercise per Week: 2 days    Minutes of Exercise per Session: 10 min  Stress: No Stress Concern Present (03/23/2024)   [employer] of Occupational Health - Occupational Stress Questionnaire    Feeling of Stress : Not at all  Social Connections: Socially Integrated (03/23/2024)   Social Connection and Isolation Panel [NHANES]    Frequency of Communication with Friends and Family: More than three times a week    Frequency of Social Gatherings with Friends and Family: Twice a week    Attends Religious Services: 1 to 4 times per year    Active Member of [employer] or Organizations: Yes    Attends Banker Meetings: 1 to 4 times per year    Marital Status: Living with partner    Tobacco Counseling Counseling given: No    Clinical Intake:  Pre-visit preparation completed: Yes  Pain : No/denies pain     BMI - recorded: 23.65 Nutritional Risks: None Diabetes: No  Lab Results  Component Value Date   HGBA1C 6.0 11/16/2023   HGBA1C 5.8 06/18/2023   HGBA1C 5.9 11/24/2022     How often do you need to have someone help you when you read instructions, pamphlets, or other written materials from your doctor or pharmacy?: 1 - Never  Interpreter Needed?: No  Information entered by :: Kandy Orris, CMA   Activities of Daily Living     03/23/2024   12:43 PM 06/21/2023    8:23 AM  In your present state of health, do you have any difficulty performing the following activities:  Hearing? 0 0  Vision? 0 0  Difficulty concentrating or making decisions? 0 1  Walking or climbing stairs? 0 0   Dressing or bathing? 0 0  Doing errands, shopping? 0 0  Preparing Food and eating ? N N  Using the Toilet? N N  In the past six months, have you accidently leaked urine? N Y  Do you  have problems with loss of bowel control? N N  Managing your Medications? N N  Managing your Finances? N N  Housekeeping or managing your Housekeeping? N N    Patient Care Team: Roslyn Coombe, MD as PCP - General Renna Cary Myrtle Atta, MD as PCP - Cardiology (Cardiology) Faustina Hood, MD as Consulting Physician (Pulmonary Disease) [facility] (Ophthalmology)  Indicate any recent Medical Services you may have received from other than [facility] providers in the past year (date may be approximate).     Assessment:    This is a routine wellness examination for Valon.  Hearing/Vision screen Hearing Screening - Comments:: Denies hearing difficulties   Vision Screening - Comments:: Wears rx glasses - up to date with routine eye exams with Dr Candi Chafe   Goals Addressed               This Visit's Progress     Patient Stated (pt-stated)        Patient stated he's wanting to manage his breathing - in Pulmonary Rehab now.       Depression Screen     03/23/2024   12:46 PM 02/18/2024    9:11 AM 12/22/2023    9:55 AM 11/16/2023   10:20 AM 06/21/2023    8:22 AM 06/18/2023    8:45 AM 11/24/2022    3:44 PM  PHQ 2/9 Scores  PHQ - 2 Score 0 0 0 0 1 1 0  PHQ- 9 Score 3 3    5      Fall Risk     03/23/2024   12:45 PM 03/23/2024   11:15 AM 03/21/2024   10:00 AM 03/16/2024   10:29 AM 03/14/2024   10:46 AM  Fall Risk   Falls in the past year? 0 0 0 0 0  Number falls in past yr: 0 0 0 0 0  Injury with Fall? 0 0 0 0 0  Risk for fall due to : No Fall Risks No Fall Risks No Fall Risks No Fall Risks No Fall Risks  Follow up Falls prevention discussed;Falls evaluation completed Falls evaluation completed;Falls prevention discussed Falls evaluation completed Falls evaluation completed Falls evaluation  completed;Falls prevention discussed    MEDICARE RISK AT HOME:  Medicare Risk at Home Any stairs in or around the home?: Yes (outside) If so, are there any without handrails?: No Home free of loose throw rugs in walkways, pet beds, electrical cords, etc?: Yes Adequate lighting in your home to reduce risk of falls?: Yes Life alert?: No Use of a cane, walker or w/c?: No Grab bars in the bathroom?: Yes Shower chair or bench in shower?: No Elevated toilet seat or a handicapped toilet?: No  TIMED UP AND GO:  Was the test performed?  No  Cognitive Function: 6CIT completed    02/01/2019   10:28 AM 12/18/2016   11:57 AM  MMSE - Mini Mental State Exam  Orientation to time 5 5  Orientation to Place 5 5  Registration 3 3  Attention/ Calculation 5 5  Recall 3 3  Language- name 2 objects 2 2  Language- repeat 1 1  Language- follow 3 step command 3 3  Language- read & follow direction 1 1  Write a sentence 1 1  Copy design 1 1  Total score 30 30        03/23/2024   12:48 PM 06/21/2023    8:24 AM 07/26/2020    1:11 PM  6CIT Screen  What Year?  0 points 0 points 0 points  What month? 0 points 0 points 0 points  What time? 0 points 0 points 0 points  Count back from 20 0 points 0 points 0 points  Months in reverse 0 points 0 points 0 points  Repeat phrase 0 points 4 points 2 points  Total Score 0 points 4 points 2 points    Immunizations Immunization History  Administered Date(s) Administered   Fluad Quad(high Dose 65+) 08/04/2019, 07/26/2020, 07/31/2021, 07/31/2022   H1N1 09/16/2008   Influenza Split 08/27/2013, 07/28/2017, 09/16/2018   Influenza Whole 08/17/2005, 09/16/2009   Influenza, High Dose Seasonal PF 08/18/2017, 07/22/2023   Influenza, Seasonal, Injecte, Preservative Fre 08/12/2010, 09/07/2011, 09/15/2012, 08/29/2013   Influenza,inj,Quad PF,6+ Mos 07/27/2016   Influenza,trivalent, recombinat, inj, PF 08/27/2018   Influenza-Unspecified 07/17/2006, 07/18/2007,  08/16/2008, 09/28/2008, 09/18/2009, 10/04/2014, 10/17/2015, 09/16/2016, 08/23/2017, 08/16/2018, 09/16/2018, 08/04/2019, 07/26/2020, 07/31/2021, 07/17/2022   PFIZER Comirnaty(Gray Top)Covid-19 Tri-Sucrose Vaccine 03/13/2021   PFIZER(Purple Top)SARS-COV-2 Vaccination 12/09/2019, 12/30/2019, 08/13/2020   Pfizer Covid-19 Vaccine Bivalent Booster 86yrs & up 09/10/2021, 03/10/2022   Pfizer(Comirnaty)Fall Seasonal Vaccine 12 years and older 09/01/2022   Pneumococcal Conjugate-13 04/10/2014, 06/27/2014   Pneumococcal Polysaccharide-23 08/19/2006, 05/11/2007, 09/15/2012, 03/09/2018   Polio, Unspecified 03/03/1954, 09/01/1954, 06/30/1958   Rsv, Bivalent, Protein Subunit Rsvpref,pf Pattricia Bores) 09/01/2022   Smallpox 08/05/1964, 04/26/1972   Td 01/01/2017   Tdap 05/11/2007   Tetanus 08/05/1964   Zoster Recombinant(Shingrix) 01/04/2018, 04/04/2018   Zoster, Live 08/19/2006, 01/01/2012    Screening Tests Health Maintenance  Topic Date Due   Lung Cancer Screening  Never done   INFLUENZA VACCINE  06/16/2024   Medicare Annual Wellness (AWV)  03/23/2025   DTaP/Tdap/Td (4 - Td or Tdap) 01/01/2027   Pneumonia Vaccine 46+ Years old  Completed   Hepatitis C Screening  Completed   Zoster Vaccines- Shingrix  Completed   HPV VACCINES  Aged Out   Meningococcal B Vaccine  Aged Out   Colonoscopy  Discontinued   COVID-19 Vaccine  Discontinued    Health Maintenance  Health Maintenance Due  Topic Date Due   Lung Cancer Screening  Never done   Health Maintenance Items Addressed:  Lung Cancer Screening ordered today  Additional Screening:  Vision Screening: Recommended annual ophthalmology exams for early detection of glaucoma and other disorders of the eye.  Dental Screening: Recommended annual dental exams for proper oral hygiene  Community Resource Referral / Chronic Care Management: CRR required this visit?  No   CCM required this visit?  No   Plan:    I have personally reviewed and noted  the following in the patient's chart:   Medical and social history Use of alcohol, tobacco or illicit drugs  Current medications and supplements including opioid prescriptions. Patient is not currently taking opioid prescriptions. Functional ability and status Nutritional status Physical activity Advanced directives List of other physicians Hospitalizations, surgeries, and ER visits in previous 12 months Vitals Screenings to include cognitive, depression, and falls Referrals and appointments  In addition, I have reviewed and discussed with patient certain preventive protocols, quality metrics, and best practice recommendations. A written personalized care plan for preventive services as well as general preventive health recommendations were provided to patient.   Patria Bookbinder, CMA   03/23/2024   After Visit Summary: (MyChart) Due to this being a telephonic visit, the after visit summary with patients personalized plan was offered to patient via MyChart   Notes: Nothing significant to report at this time.

## 2024-03-23 NOTE — Progress Notes (Signed)
 Home Exercise Prescription I have reviewed a Home Exercise Prescription with Ryan Diaz. Ryan Diaz is not currently exercising outside of rehab. He has access to the [facility]. I encouraged him to attend the [facility] and walk on the treadmill or ride the bike. I recommended attending the [facility] 1 non-rehab day/wk for 30 min/day. He agreed with my recommendations. Ryan Diaz seems motivated to exercise outside of rehab. The patient stated that their goals were to improve health. We reviewed exercise guidelines, target heart rate during exercise, RPE Scale, weather conditions, endpoints for exercise, warmup and cool down. The patient is encouraged to come to me with any questions. I will continue to follow up with the patient to assist them with progression and safety. Spent 15 min with patient discussing home exercise plan and goals  Ryan Huron, MS, ACSM-CEP 03/23/2024 3:31 PM

## 2024-03-23 NOTE — Patient Instructions (Signed)
 Ryan Diaz , Thank you for taking time out of your busy schedule to complete your Annual Wellness Visit with me. I enjoyed our conversation and look forward to speaking with you again next year. I, as well as your care team,  appreciate your ongoing commitment to your health goals. Please review the following plan we discussed and let me know if I can assist you in the future. Your Game plan/ To Do List     Follow up Visits: Next Medicare AWV with our clinical staff: 03/26/2024 at 1:50pm   Have you seen your provider in the last 6 months (3 months if uncontrolled diabetes)? Yes Next Office Visit with your provider: 06/26/2024 at 10:00am  Clinician Recommendations:  Aim for 30 minutes of exercise or brisk walking, 6-8 glasses of water, and 5 servings of fruits and vegetables each day. Aim for 30 minutes of exercise or brisk walking, 6-8 glasses of water, and 5 servings of fruits and vegetables each day.  Lung Cancer Screening test ordered.  Expect a phonecall from [facility] office.      This is a list of the screening recommended for you and due dates:  Health Maintenance  Topic Date Due   Screening for Lung Cancer  Never done   Flu Shot  06/16/2024   Medicare Annual Wellness Visit  03/23/2025   DTaP/Tdap/Td vaccine (4 - Td or Tdap) 01/01/2027   Pneumonia Vaccine  Completed   Hepatitis C Screening  Completed   Zoster (Shingles) Vaccine  Completed   HPV Vaccine  Aged Out   Meningitis B Vaccine  Aged Out   Colon Cancer Screening  Discontinued   COVID-19 Vaccine  Discontinued    Advanced directives: (Provided) Advance directive discussed with you today. I have provided a copy for you to complete at home and have notarized. Once this is complete, please bring a copy in to our office so we can scan it into your chart.  Advance Care Planning is important because it:  [x]  Makes sure you receive the medical care that is consistent with your values, goals, and preferences  [x]  It  provides guidance to your family and loved ones and reduces their decisional burden about whether or not they are making the right decisions based on your wishes.  Follow the link provided in your after visit summary or read over the paperwork we have mailed to you to help you started getting your Advance Directives in place. If you need assistance in completing these, please reach out to us  so that we can help you!

## 2024-03-27 ENCOUNTER — Telehealth (HOSPITAL_COMMUNITY): Payer: Self-pay

## 2024-03-27 NOTE — Telephone Encounter (Signed)
-----   Message from [place] C sent at 03/27/2024 10:45 AM EDT ----- Regarding: Pt called out Hey,  Pt called and stated he will not be able to attend [place] on 5/13 due to going out of town.  Thanks, Camilo Cella

## 2024-03-28 ENCOUNTER — Encounter (HOSPITAL_COMMUNITY)

## 2024-03-29 NOTE — Progress Notes (Signed)
 Pulmonary Individual Treatment Plan  Patient Details  Name: Ryan Diaz MRN: [id number] Date of Birth: Mar 25, 1946 Referring Provider:   [name] Pulmonary Rehab Walk Test from 02/18/2024 in [facility]  Referring Provider Ryan Diaz  [[place]]       Initial Encounter Date:  Flowsheet Row Pulmonary Rehab Walk Test from 02/18/2024 in [facility]  Date 02/18/24       Visit Diagnosis: Chronic obstructive pulmonary disease, unspecified COPD type (HCC)  Patient's Home Medications on Admission:   Current Outpatient Medications:    albuterol  (ACCUNEB ) 0.63 MG/3ML nebulizer solution, Take 3 mLs (0.63 mg total) by nebulization every 6 (six) hours as needed for wheezing., Disp: 75 mL, Rfl: 12   albuterol  (VENTOLIN  HFA) 108 (90 Base) MCG/ACT inhaler, Inhale 2 puffs into the lungs every 6 (six) hours as needed for wheezing or shortness of breath., Disp: 18 g, Rfl: 12   aspirin  EC 81 MG tablet, Take 81 mg by mouth daily., Disp: , Rfl:    atorvastatin  (LIPITOR) 80 MG tablet, Take 1 tablet by mouth once daily, Disp: 90 tablet, Rfl: 2   cetirizine  (ZYRTEC ) 10 MG tablet, Take by mouth., Disp: , Rfl:    Cholecalciferol (VITAMIN D ) 50 MCG (2000 UT) tablet, Take 1 tablet (2,000 Units total) by mouth daily., Disp: 90 tablet, Rfl: 3   Cholecalciferol 50 MCG (2000 UT) TABS, Take 1 tablet by mouth daily., Disp: , Rfl:    ciclesonide (ALVESCO) 160 MCG/ACT inhaler, Inhale into the lungs., Disp: , Rfl:    clotrimazole  (LOTRIMIN ) 1 % cream, Apply 1 application topically 2 (two) times daily as needed (irritation)., Disp: , Rfl:    clotrimazole -betamethasone  (LOTRISONE ) cream, Apply 1 Application topically daily. Use as directed twice per day as needed, Disp: 30 g, Rfl: 0   cromolyn (OPTICROM) 4 % ophthalmic solution, Place 1 drop into both eyes 4 (four) times daily as needed., Disp: , Rfl:     cyanocobalamin  (VITAMIN B12) 1000 MCG tablet, Take 1 tablet (1,000 mcg total) by mouth daily., Disp: 90 tablet, Rfl: 3   docusate sodium (COLACE) 100 MG capsule, Take 100 mg by mouth daily as needed for mild constipation., Disp: , Rfl:    ferrous sulfate 324 MG TBEC, Take 324 mg by mouth. As needed, Disp: , Rfl:    fluticasone  (FLONASE ) 50 MCG/ACT nasal spray, Place into the nose., Disp: , Rfl:    Fluticasone -Umeclidin-Vilant (TRELEGY ELLIPTA ) 100-62.5-25 MCG/ACT AEPB, Inhale 1 puff then rinse mouth, once daily, Disp: 60 each, Rfl: 12   gabapentin  (NEURONTIN ) 100 MG capsule, Take 1 capsule (100 mg total) by mouth 3 (three) times daily., Disp: 90 capsule, Rfl: 5   hydrocortisone 2.5 % cream, SMARTSIG:Topical 1-2 Times Daily PRN, Disp: , Rfl:    ipratropium (ATROVENT ) 0.06 % nasal spray, Place 2 sprays into both nostrils 3 (three) times daily., Disp: 15 mL, Rfl: 5   levocetirizine (XYZAL  ALLERGY 24HR) 5 MG tablet, Take 1 tablet (5 mg total) by mouth every evening., Disp: 30 tablet, Rfl: 5   losartan  (COZAAR ) 100 MG tablet, Take 1 tablet (100 mg total) by mouth daily., Disp: 90 tablet, Rfl: 3   meloxicam  (MOBIC ) 15 MG tablet, 1 tab by mouth once daily as needed, Disp: 90 tablet, Rfl: 1   mirtazapine (REMERON) 7.5 MG tablet, Take by mouth., Disp: , Rfl:    nitroGLYCERIN  (NITROSTAT ) 0.4 MG SL tablet, Place 1 tablet (  0.4 mg total) under the tongue every 5 (five) minutes as needed for chest pain., Disp: 25 tablet, Rfl: 4   Omega-3 Fatty Acids (FISH OIL) 1000 MG CAPS, Take 1,000 mg by mouth 2 (two) times daily., Disp: , Rfl:    pantoprazole (PROTONIX) 40 MG tablet, TAKE ONE TABLET BY MOUTH ONCE EVERY DAY TO CONTROL STOMACH ACID, Disp: , Rfl:    pantoprazole (PROTONIX) 40 MG tablet, Take by mouth., Disp: , Rfl:    Spacer/Aero-Holding Chambers (AEROCHAMBER MV) inhaler, Use as instructed, Disp: 1 each, Rfl: 0   tadalafil (CIALIS) 20 MG tablet, Take 20 mg by mouth daily as needed for erectile dysfunction.,  Disp: , Rfl:    terazosin  (HYTRIN ) 2 MG capsule, Take 1 capsule (2 mg total) by mouth at bedtime., Disp: 90 capsule, Rfl: 3   Tiotropium Bromide-Olodaterol 2.5-2.5 MCG/ACT AERS, Inhale into the lungs., Disp: , Rfl:    traZODone (DESYREL) 50 MG tablet, Take 50 mg by mouth at bedtime as needed for sleep., Disp: , Rfl:    triamcinolone  (NASACORT ) 55 MCG/ACT AERO nasal inhaler, Place 2 sprays into the nose daily., Disp: 1 each, Rfl: 12  Past Medical History: Past Medical History:  Diagnosis Date   ABUSE, ALCOHOL, CONTINUOUS 08/08/2007   ALLERGIC RHINITIS 08/08/2007   ANXIETY 08/08/2007   Carpal tunnel syndrome    COPD 10/03/2009   DEPRESSION 08/08/2007   ERECTILE DYSFUNCTION 08/08/2007   GENITAL HERPES, HX OF 08/08/2007   GERD 08/08/2007   GLUCOSE INTOLERANCE 08/08/2007   HYPERLIPIDEMIA 08/08/2007   HYPERTENSION 08/08/2007   Impaired glucose tolerance 09/26/2011   Insomnia 05/18/2012   PAIN IN SOFT TISSUES OF LIMB 10/02/2009   Sleep apnea    OSA-uses CPAP nightly    Tobacco Use: Social History   Tobacco Use  Smoking Status Former   Current packs/day: 0.00   Average packs/day: 1.5 packs/day for 35.0 years (52.5 ttl pk-yrs)   Types: Cigarettes   Start date: 09/04/1974   Quit date: 09/04/2009   Years since quitting: 14.5   Passive exposure: Past  Smokeless Tobacco Never    Labs: Review Flowsheet  More data exists      Latest Ref Rng & Units 06/23/2022 07/31/2022 11/24/2022 06/18/2023 11/16/2023  Labs for ITP Cardiac and Pulmonary Rehab  Cholestrol 0 - 200 mg/dL 161  096  045  409  811   LDL (calc) 0 - 99 mg/dL 69  65  44  71  59   HDL-C >39.00 mg/dL 91.47  82.95  62.13  08.65  34.00   Trlycerides 0.0 - 149.0 mg/dL 78.4  696.2  952.8  413.2  81.0   Hemoglobin A1c 4.6 - 6.5 % 6.0  5.8  5.9  5.8  6.0     Capillary Blood Glucose: No results found for: "GLUCAP"   Pulmonary Assessment Scores:  Pulmonary Assessment Scores     Row Name 02/18/24 1001         ADL UCSD   ADL Phase  Entry     SOB Score total 34       CAT Score   CAT Score 14       mMRC Score   mMRC Score 1             UCSD: Self-administered rating of dyspnea associated with activities of daily living (ADLs) 6-point scale (0 = "not at all" to 5 = "maximal or unable to do because of breathlessness")  Scoring Scores range from 0 to 120.  Minimally important  difference is 5 units  CAT: CAT can identify the health impairment of COPD patients and is better correlated with disease progression.  CAT has a scoring range of zero to 40. The CAT score is classified into four groups of low (less than 10), medium (10 - 20), high (21-30) and very high (31-40) based on the impact level of disease on health status. A CAT score over 10 suggests significant symptoms.  A worsening CAT score could be explained by an exacerbation, poor medication adherence, poor inhaler technique, or progression of COPD or comorbid conditions.  CAT MCID is 2 points  mMRC: mMRC (Modified Medical Research Council) Dyspnea Scale is used to assess the degree of baseline functional disability in patients of respiratory disease due to dyspnea. No minimal important difference is established. A decrease in score of 1 point or greater is considered a positive change.   Pulmonary Function Assessment:  Pulmonary Function Assessment - 02/18/24 0943       Breath   Bilateral Breath Sounds Clear;Decreased    Shortness of Breath Yes;Fear of Shortness of Breath;Limiting activity;Panic with Shortness of Breath             Exercise Target Goals: Exercise Program Goal: Individual exercise prescription set using results from initial 6 min walk test and THRR while considering  patient's activity barriers and safety.   Exercise Prescription Goal: Initial exercise prescription builds to 30-45 minutes a day of aerobic activity, 2-3 days per week.  Home exercise guidelines will be given to patient during program as part of exercise prescription  that the participant will acknowledge.  Activity Barriers & Risk Stratification:  Activity Barriers & Cardiac Risk Stratification - 02/18/24 0941       Activity Barriers & Cardiac Risk Stratification   Activity Barriers Deconditioning;Muscular Weakness;Shortness of Breath;Arthritis;Balance Concerns    Cardiac Risk Stratification Moderate             6 Minute Walk:  6 Minute Walk     Row Name 02/18/24 1027         6 Minute Walk   Phase Initial     Distance 1295 feet     Walk Time 6 minutes     # of Rest Breaks 0     MPH 2.45     METS 2.66     RPE 11     Perceived Dyspnea  1     VO2 Peak 9.32     Symptoms No     Resting HR 89 bpm     Resting BP 111/65     Resting Oxygen  Saturation  98 %     Exercise Oxygen  Saturation  during 6 min walk 95 %     Max Ex. HR 106 bpm     Max Ex. BP 128/70     2 Minute Post BP 102/60       Interval HR   1 Minute HR 97     2 Minute HR 105     3 Minute HR 101     4 Minute HR 101     5 Minute HR 106     6 Minute HR 106     2 Minute Post HR 83     Interval Heart Rate? Yes       Interval Oxygen    Interval Oxygen ? Yes     Baseline Oxygen  Saturation % 98 %     1 Minute Oxygen  Saturation % 97 %     1 Minute Liters  of Oxygen  0 L     2 Minute Oxygen  Saturation % 95 %     2 Minute Liters of Oxygen  0 L     3 Minute Oxygen  Saturation % 97 %     3 Minute Liters of Oxygen  0 L     4 Minute Oxygen  Saturation % 95 %     4 Minute Liters of Oxygen  0 L     5 Minute Oxygen  Saturation % 966 %     5 Minute Liters of Oxygen  0 L     6 Minute Oxygen  Saturation % 96 %     6 Minute Liters of Oxygen  0 L     2 Minute Post Oxygen  Saturation % 98 %     2 Minute Post Liters of Oxygen  0 L              Oxygen  Initial Assessment:  Oxygen  Initial Assessment - 02/18/24 0943       Home Oxygen    Home Oxygen  Device None    Sleep Oxygen  Prescription CPAP    Home Exercise Oxygen  Prescription None    Home Resting Oxygen  Prescription None       Initial 6 min Walk   Oxygen  Used None      Program Oxygen  Prescription   Program Oxygen  Prescription None      Intervention   Short Term Goals To learn and understand importance of monitoring SPO2 with pulse oximeter and demonstrate accurate use of the pulse oximeter.;To learn and understand importance of maintaining oxygen  saturations>88%;To learn and demonstrate proper pursed lip breathing techniques or other breathing techniques. ;To learn and demonstrate proper use of respiratory medications    Long  Term Goals Maintenance of O2 saturations>88%;Compliance with respiratory medication;Verbalizes importance of monitoring SPO2 with pulse oximeter and return demonstration;Exhibits proper breathing techniques, such as pursed lip breathing or other method taught during program session;Demonstrates proper use of MDI's             Oxygen  Re-Evaluation:  Oxygen  Re-Evaluation     Row Name 02/23/24 0849 03/24/24 0939           Program Oxygen  Prescription   Program Oxygen  Prescription None None        Home Oxygen    Home Oxygen  Device None None      Sleep Oxygen  Prescription CPAP CPAP      Home Exercise Oxygen  Prescription None None      Home Resting Oxygen  Prescription None None        Goals/Expected Outcomes   Short Term Goals To learn and understand importance of monitoring SPO2 with pulse oximeter and demonstrate accurate use of the pulse oximeter.;To learn and understand importance of maintaining oxygen  saturations>88%;To learn and demonstrate proper pursed lip breathing techniques or other breathing techniques. ;To learn and demonstrate proper use of respiratory medications To learn and understand importance of monitoring SPO2 with pulse oximeter and demonstrate accurate use of the pulse oximeter.;To learn and understand importance of maintaining oxygen  saturations>88%;To learn and demonstrate proper pursed lip breathing techniques or other breathing techniques. ;To learn and  demonstrate proper use of respiratory medications      Long  Term Goals Maintenance of O2 saturations>88%;Compliance with respiratory medication;Verbalizes importance of monitoring SPO2 with pulse oximeter and return demonstration;Exhibits proper breathing techniques, such as pursed lip breathing or other method taught during program session;Demonstrates proper use of MDI's Maintenance of O2 saturations>88%;Compliance with respiratory medication;Verbalizes importance of monitoring SPO2 with pulse oximeter and return demonstration;Exhibits proper breathing techniques,  such as pursed lip breathing or other method taught during program session;Demonstrates proper use of MDI's      Goals/Expected Outcomes Compliance and understanding of oxygen  saturation monitoring and breathing techniques to decrease shortness of breath. Compliance and understanding of oxygen  saturation monitoring and breathing techniques to decrease shortness of breath.               Oxygen  Discharge (Final Oxygen  Re-Evaluation):  Oxygen  Re-Evaluation - 03/24/24 0939       Program Oxygen  Prescription   Program Oxygen  Prescription None      Home Oxygen    Home Oxygen  Device None    Sleep Oxygen  Prescription CPAP    Home Exercise Oxygen  Prescription None    Home Resting Oxygen  Prescription None      Goals/Expected Outcomes   Short Term Goals To learn and understand importance of monitoring SPO2 with pulse oximeter and demonstrate accurate use of the pulse oximeter.;To learn and understand importance of maintaining oxygen  saturations>88%;To learn and demonstrate proper pursed lip breathing techniques or other breathing techniques. ;To learn and demonstrate proper use of respiratory medications    Long  Term Goals Maintenance of O2 saturations>88%;Compliance with respiratory medication;Verbalizes importance of monitoring SPO2 with pulse oximeter and return demonstration;Exhibits proper breathing techniques, such as pursed lip  breathing or other method taught during program session;Demonstrates proper use of MDI's    Goals/Expected Outcomes Compliance and understanding of oxygen  saturation monitoring and breathing techniques to decrease shortness of breath.             Initial Exercise Prescription:  Initial Exercise Prescription - 02/18/24 1000       Date of Initial Exercise RX and Referring Provider   Date 02/18/24    Referring Provider Ryan Diaz   Expected Discharge Date 05/16/24      Treadmill   MPH 2.5    Grade 0    Minutes 15    METs 2.91      Recumbant Elliptical   Level 2    RPM 253    Watts 50    Minutes 15    METs 2.5      Prescription Details   Frequency (times per week) 2    Duration Progress to 30 minutes of continuous aerobic without signs/symptoms of physical distress      Intensity   THRR 40-80% of Max Heartrate 57-114    Ratings of Perceived Exertion 11-13    Perceived Dyspnea 0-4      Progression   Progression Continue to progress workloads to maintain intensity without signs/symptoms of physical distress.      Resistance Training   Training Prescription Yes    Weight blue bands    Reps 10-15             Perform Capillary Blood Glucose checks as needed.  Exercise Prescription Changes:   Exercise Prescription Changes     Row Name 02/29/24 1200 03/14/24 1100 03/23/24 1146         Response to Exercise   Blood Pressure (Admit) 104/58 112/62 120/60     Blood Pressure (Exercise) 128/56 122/62 --     Blood Pressure (Exit) 96/58 130/60 98/58     Heart Rate (Admit) 78 bpm 70 bpm 70 bpm     Heart Rate (Exercise) 116 bpm 105 bpm 107 bpm     Heart Rate (Exit) 92 bpm 84 bpm 87 bpm     Oxygen  Saturation (Admit) 97 % 96 % 96 %  Oxygen  Saturation (Exercise) 95 % 94 % 95 %     Oxygen  Saturation (Exit) 94 % 96 % 96 %     Rating of Perceived Exertion (Exercise) 13 10 9      Perceived Dyspnea (Exercise) 3 1 1      Duration Continue with 30 min of aerobic  exercise without signs/symptoms of physical distress. Continue with 30 min of aerobic exercise without signs/symptoms of physical distress. Continue with 30 min of aerobic exercise without signs/symptoms of physical distress.     Intensity THRR unchanged THRR unchanged THRR unchanged       Progression   Progression -- -- Continue to progress workloads to maintain intensity without signs/symptoms of physical distress.     Average METs -- -- 5       Resistance Training   Training Prescription Yes Yes Yes     Weight blue bands blue bands blue bands     Reps 10-15 10-15 10-15     Time 10 Minutes 10 Minutes 10 Minutes       Treadmill   MPH 2.5 2.5 2.7     Grade 1 1.5 2     Minutes 15 15 15      METs 3.1 3.41 3.7       Recumbant Elliptical   Level 3 3 4      RPM 64 68 68     Watts 92 -- --     Minutes 15 15 15      METs 4.3 5.1 5              Exercise Comments:   Exercise Comments     Row Name 02/22/24 1528 03/23/24 1528         Exercise Comments Pt completed first day of exercise. Ryan Diaz averaged 2.7 METs at 2.4 mph on the treadmill and 5.4 METs at level 2 on the recumbent elliptical. He performed the warmup and cooldown standing without limitations. Discussed METs. Completed home exercise plan Ryan Diaz is not currently exercising outside of rehab. He has access to the [facility]. I encouraged him to attend the [facility] and walk on the treadmill or ride the bike. I recommended attending the [facility] 1 non-rehab day/wk for 30 min/day. He agreed with my recommendations. Ryan Diaz seems motivated to exercise outside of rehab.               Exercise Goals and Review:   Exercise Goals     Row Name 02/18/24 [phone]             Exercise Goals   Increase Physical Activity Yes       Intervention Provide advice, education, support and counseling about physical activity/exercise needs.;Develop an individualized exercise prescription for aerobic and resistive training based on initial evaluation  findings, risk stratification, comorbidities and participant's personal goals.       Expected Outcomes Short Term: Attend rehab on a regular basis to increase amount of physical activity.;Long Term: Add in home exercise to make exercise part of routine and to increase amount of physical activity.;Long Term: Exercising regularly at least 3-5 days a week.       Increase Strength and Stamina Yes       Intervention Provide advice, education, support and counseling about physical activity/exercise needs.;Develop an individualized exercise prescription for aerobic and resistive training based on initial evaluation findings, risk stratification, comorbidities and participant's personal goals.       Expected Outcomes Short Term: Increase workloads from initial exercise prescription for resistance, speed, and METs.;Short  Term: Perform resistance training exercises routinely during rehab and add in resistance training at home;Long Term: Improve cardiorespiratory fitness, muscular endurance and strength as measured by increased METs and functional capacity ( )       Able to understand and use rate of perceived exertion (RPE) scale Yes       Intervention Provide education and explanation on how to use RPE scale       Expected Outcomes Short Term: Able to use RPE daily in rehab to express subjective intensity level;Long Term:  Able to use RPE to guide intensity level when exercising independently       Able to understand and use Dyspnea scale Yes       Intervention Provide education and explanation on how to use Dyspnea scale       Expected Outcomes Short Term: Able to use Dyspnea scale daily in rehab to express subjective sense of shortness of breath during exertion;Long Term: Able to use Dyspnea scale to guide intensity level when exercising independently       Knowledge and understanding of Target Heart Rate Range (THRR) Yes       Intervention Provide education and explanation of THRR including how the numbers  were predicted and where they are located for reference       Expected Outcomes Short Term: Able to state/look up THRR;Long Term: Able to use THRR to govern intensity when exercising independently;Short Term: Able to use daily as guideline for intensity in rehab       Understanding of Exercise Prescription Yes       Intervention Provide education, explanation, and written materials on patient's individual exercise prescription       Expected Outcomes Short Term: Able to explain program exercise prescription;Long Term: Able to explain home exercise prescription to exercise independently                Exercise Goals Re-Evaluation :  Exercise Goals Re-Evaluation     Row Name 02/23/24 0842 03/24/24 0937           Exercise Goal Re-Evaluation   Exercise Goals Review Increase Physical Activity;Able to understand and use Dyspnea scale;Understanding of Exercise Prescription;Increase Strength and Stamina;Knowledge and understanding of Target Heart Rate Range (THRR);Able to understand and use rate of perceived exertion (RPE) scale Increase Physical Activity;Able to understand and use Dyspnea scale;Understanding of Exercise Prescription;Increase Strength and Stamina;Knowledge and understanding of Target Heart Rate Range (THRR);Able to understand and use rate of perceived exertion (RPE) scale      Comments Ryan Diaz has completed 1 exercise session. He averages 2.7 METs at 2.4 mph on the treadmill and 5.4 METs at level 2 on the recumbent elliptical. Ryan Diaz performs the warmup and cooldown standing without limitations. It is too soon to notate any discernable progressions. Will continue to monitor and progress as able. Ryan Diaz has completed 10 exercise sessions. He averages 3.7 METs at 2.7 mph and 2% incline on the treadmill and 5 METs at level 4 on the recumbent elliptical. Ryan Diaz performs the warmup and cooldown standing without limitations. He has increased his speed and incline on the treadmill and level on  the Nustep. He tolerates progressions well. Ryan Diaz does need encouragement to increase his levels. We have recently discussed home exercise as I encouraged Ryan Diaz to attend the [facility] weekly. Will continue to monitor and progress as able.      Expected Outcomes Through exercise at rehab and home, the patient will decrease shortness of breath with daily activities and feel confident carrying  out an exercise regimn at home. Through exercise at rehab and home, the patient will decrease shortness of breath with daily activities and feel confident carrying out an exercise regimn at home.               Discharge Exercise Prescription (Final Exercise Prescription Changes):  Exercise Prescription Changes - 03/23/24 1146       Response to Exercise   Blood Pressure (Admit) 120/60    Blood Pressure (Exit) 98/58    Heart Rate (Admit) 70 bpm    Heart Rate (Exercise) 107 bpm    Heart Rate (Exit) 87 bpm    Oxygen  Saturation (Admit) 96 %    Oxygen  Saturation (Exercise) 95 %    Oxygen  Saturation (Exit) 96 %    Rating of Perceived Exertion (Exercise) 9    Perceived Dyspnea (Exercise) 1    Duration Continue with 30 min of aerobic exercise without signs/symptoms of physical distress.    Intensity THRR unchanged      Progression   Progression Continue to progress workloads to maintain intensity without signs/symptoms of physical distress.    Average METs 5      Resistance Training   Training Prescription Yes    Weight blue bands    Reps 10-15    Time 10 Minutes      Treadmill   MPH 2.7    Grade 2    Minutes 15    METs 3.7      Recumbant Elliptical   Level 4    RPM 68    Minutes 15    METs 5             Nutrition:  Target Goals: Understanding of nutrition guidelines, daily intake of sodium 1500mg , cholesterol 200mg , calories 30% from fat and 7% or less from saturated fats, daily to have 5 or more servings of fruits and vegetables.  Biometrics:    Nutrition Therapy Plan and  Nutrition Goals:  Nutrition Therapy & Goals - 03/23/24 1205       Nutrition Therapy   Diet General Healthy Diet    Drug/Food Interactions Statins/Certain Fruits      Personal Nutrition Goals   Nutrition Goal Patient to identify strategies for weight gain of 0.5-2.0# per week.   goal not met.   Comments Goal not met at this time. Ryan Diaz has medical history of HTN, CAD, OSA, dementia, CKD3, hyperlipidemia, alcohol abuse. He is motivated to gain ~5# to 170#. We discussed strategies for weight gain including increasing eating frequency, etc. He has maintained his weight since starting with our program. He continues to complete group therapy weekly through the [place]. Ryan Diaz will continue to benefit from participation in pulmonary rehab for nutrition, exercise, and lifestyle modification.      Intervention Plan   Intervention Prescribe, educate and counsel regarding individualized specific dietary modifications aiming towards targeted core components such as weight, hypertension, lipid management, diabetes, heart failure and other comorbidities.;Nutrition handout(s) given to patient.    Expected Outcomes Short Term Goal: Understand basic principles of dietary content, such as calories, fat, sodium, cholesterol and nutrients.;Long Term Goal: Adherence to prescribed nutrition plan.             Nutrition Assessments:  MEDIFICTS Score Key: >=70 Need to make dietary changes  40-70 Heart Healthy Diet <= 40 Therapeutic Level Cholesterol Diet  Flowsheet Row PULMONARY REHAB CHRONIC OBSTRUCTIVE PULMONARY DISEASE from 07/17/2021 in [facility]  Picture Your  Plate Total Score on Discharge 53      Picture Your Plate Scores: <78 Unhealthy dietary pattern with much room for improvement. 41-50 Dietary pattern unlikely to meet recommendations for good health and room for improvement. 51-60 More healthful dietary pattern, with some room for  improvement.  >60 Healthy dietary pattern, although there may be some specific behaviors that could be improved.    Nutrition Goals Re-Evaluation:  Nutrition Goals Re-Evaluation     Row Name 02/24/24 1102 03/23/24 1205           Goals   Current Weight 163 lb 12.8 oz (74.3 kg) 164 lb 10.9 oz (74.7 kg)      Comment Thiamine WNL, HDL 34, LDL 59,A1C 6.0, GFR 49, Cr 1.38 Thiamine WNL, HDL 34, LDL 59,A1C 6.0, GFR 49, Cr 1.38      Expected Outcome Ryan Diaz has medical history of HTN, CAD, OSA, dementia, CKD3, hyperlipidemia, alcohol abuse. He is motivated to gain ~5# to 170#. We discussed strategies for weight gain including increasing eating frequency, etc. He continues to complete group therapy weekly through the [place]. Ryan Diaz will continue to benefit from participation in pulmonary rehab for nutrition, exercise, and lifestyle modification. Goal not met at this time. Ryan Diaz has medical history of HTN, CAD, OSA, dementia, CKD3, hyperlipidemia, alcohol abuse. He is motivated to gain ~5# to 170#. We discussed strategies for weight gain including increasing eating frequency, etc. He has maintained his weight since starting with our program. He continues to complete group therapy weekly through the [place]. Ryan Diaz will continue to benefit from participation in pulmonary rehab for nutrition, exercise, and lifestyle modification.               Nutrition Goals Discharge (Final Nutrition Goals Re-Evaluation):  Nutrition Goals Re-Evaluation - 03/23/24 1205       Goals   Current Weight 164 lb 10.9 oz (74.7 kg)    Comment Thiamine WNL, HDL 34, LDL 59,A1C 6.0, GFR 49, Cr 1.38    Expected Outcome Goal not met at this time. Ryan Diaz has medical history of HTN, CAD, OSA, dementia, CKD3, hyperlipidemia, alcohol abuse. He is motivated to gain ~5# to 170#. We discussed strategies for weight gain including increasing eating frequency, etc. He has maintained his weight since starting with our program. He continues to  complete group therapy weekly through the [place]. Ryan Diaz will continue to benefit from participation in pulmonary rehab for nutrition, exercise, and lifestyle modification.             Psychosocial: Target Goals: Acknowledge presence or absence of significant depression and/or stress, maximize coping skills, provide positive support system. Participant is able to verbalize types and ability to use techniques and skills needed for reducing stress and depression.  Initial Review & Psychosocial Screening:  Initial Psych Review & Screening - 02/18/24 0934       Initial Review   Current issues with None Identified      Family Dynamics   Good Support System? Yes      Barriers   Psychosocial barriers to participate in program There are no identifiable barriers or psychosocial needs.      Screening Interventions   Interventions Encouraged to exercise             Quality of Life Scores:  Scores of 19 and below usually indicate a poorer quality of life in these areas.  A difference of  2-3 points is a clinically meaningful difference.  A difference of 2-3 points in the total  score of the Quality of Life Index has been associated with significant improvement in overall quality of life, self-image, physical symptoms, and general health in studies assessing change in quality of life.  PHQ-9: Review Flowsheet  More data exists      03/23/2024 02/18/2024 12/22/2023 11/16/2023 06/21/2023  Depression screen PHQ 2/9  Decreased Interest 0 0 0 0 1  Down, Depressed, Hopeless 0 0 0 0 0  PHQ - 2 Score 0 0 0 0 1  Altered sleeping 0 1 - - -  Tired, decreased energy 0 1 - - -  Change in appetite 0 1 - - -  Feeling bad or failure about yourself  0 0 - - -  Trouble concentrating 0 0 - - -  Moving slowly or fidgety/restless 3 0 - - -  Suicidal thoughts 0 0 - - -  PHQ-9 Score 3 3 - - -  Difficult doing work/chores Not difficult at all Somewhat difficult - - -   Interpretation of Total Score  Total  Score Depression Severity:  1-4 = Minimal depression, 5-9 = Mild depression, 10-14 = Moderate depression, 15-19 = Moderately severe depression, 20-27 = Severe depression   Psychosocial Evaluation and Intervention:  Psychosocial Evaluation - 02/18/24 1002       Psychosocial Evaluation & Interventions   Interventions Encouraged to exercise with the program and follow exercise prescription    Comments No psychosocial concerns identified at this time    Expected Outcomes For Ryan Diaz to participate in PR free of any psychosocial barriers or concerns    Continue Psychosocial Services  No Follow up required             Psychosocial Re-Evaluation:  Psychosocial Re-Evaluation     Row Name 02/21/24 0926 03/22/24 1110           Psychosocial Re-Evaluation   Current issues with None Identified None Identified      Comments Ryan Diaz is scheduled to start PR on 02/22/24. No new psychosocial barriers or concerns since orientation on 02/18/24. Ryan Diaz denies any new psychosocial barriers or concerns at this time. He continues to attend weekly therapy through the [place].      Expected Outcomes For Ryan Diaz to participate in PR free of any psychosocial barriers or concerns For Ryan Diaz to participate in PR free of any psychosocial barriers or concerns      Interventions Encouraged to attend Pulmonary Rehabilitation for the exercise Encouraged to attend Pulmonary Rehabilitation for the exercise      Continue Psychosocial Services  No Follow up required No Follow up required               Psychosocial Discharge (Final Psychosocial Re-Evaluation):  Psychosocial Re-Evaluation - 03/22/24 1110       Psychosocial Re-Evaluation   Current issues with None Identified    Comments Ryan Diaz denies any new psychosocial barriers or concerns at this time. He continues to attend weekly therapy through the [place].    Expected Outcomes For Ryan Diaz to participate in PR free of any psychosocial barriers or concerns     Interventions Encouraged to attend Pulmonary Rehabilitation for the exercise    Continue Psychosocial Services  No Follow up required             Education: Education Goals: Education classes will be provided on a weekly basis, covering required topics. Participant will state understanding/return demonstration of topics presented.  Learning Barriers/Preferences:  Learning Barriers/Preferences - 02/18/24 0936       Learning Barriers/Preferences  Learning Barriers Sight   wears glasses   Learning Preferences Audio;Computer/Internet;Group Instruction;Individual Instruction;Pictoral;Skilled Demonstration;Written Material             Education Topics: Know Your Numbers Group instruction that is supported by a PowerPoint presentation. Instructor discusses importance of knowing and understanding resting, exercise, and post-exercise oxygen  saturation, heart rate, and blood pressure. Oxygen  saturation, heart rate, blood pressure, rating of perceived exertion, and dyspnea are reviewed along with a normal range for these values.    Exercise for the Pulmonary Patient Group instruction that is supported by a PowerPoint presentation. Instructor discusses benefits of exercise, core components of exercise, frequency, duration, and intensity of an exercise routine, importance of utilizing pulse oximetry during exercise, safety while exercising, and options of places to exercise outside of rehab.  Flowsheet Row PULMONARY REHAB CHRONIC OBSTRUCTIVE PULMONARY DISEASE from 07/17/2021 in [facility]  Date 06/12/21  Educator Jess-Handout  [METS]  Instruction Review Code 1- Verbalizes Understanding       MET Level  Group instruction provided by PowerPoint, verbal discussion, and written material to support subject matter. Instructor reviews what METs are and how to increase METs.    Pulmonary Medications Verbally interactive group  education provided by instructor with focus on inhaled medications and proper administration. Flowsheet Row PULMONARY REHAB CHRONIC OBSTRUCTIVE PULMONARY DISEASE from 07/17/2021 in [facility], [facility], & [facility]  Date 06/19/21  Educator Handout       Anatomy and Physiology of the Respiratory System Group instruction provided by PowerPoint, verbal discussion, and written material to support subject matter. Instructor reviews respiratory cycle and anatomical components of the respiratory system and their functions. Instructor also reviews differences in obstructive and restrictive respiratory diseases with examples of each.  Flowsheet Row PULMONARY REHAB CHRONIC OBSTRUCTIVE PULMONARY DISEASE from 07/17/2021 in [facility], [facility]  Date 06/26/21  Educator Handout       Oxygen  Safety Group instruction provided by PowerPoint, verbal discussion, and written material to support subject matter. There is an overview of "What is Oxygen " and "Why do we need it".  Instructor also reviews how to create a safe environment for oxygen  use, the importance of using oxygen  as prescribed, and the risks of noncompliance. There is a brief discussion on traveling with oxygen  and resources the patient may utilize. Flowsheet Row PULMONARY REHAB CHRONIC OBSTRUCTIVE PULMONARY DISEASE from 02/24/2024 in [facility], [facility]  Date 02/24/24  Educator RN  Instruction Review Code 1- Verbalizes Understanding       Oxygen  Use Group instruction provided by PowerPoint, verbal discussion, and written material to discuss how supplemental oxygen  is prescribed and different types of oxygen  supply systems. Resources for more information are provided.  Flowsheet Row PULMONARY REHAB CHRONIC OBSTRUCTIVE PULMONARY DISEASE from 03/02/2024 in [facility], Vascular, [facility]  Date 03/02/24  Educator RT  Instruction Review Code 1- Verbalizes Understanding       Breathing Techniques Group instruction that is supported by demonstration and informational handouts. Instructor discusses the benefits of pursed lip and diaphragmatic breathing and detailed demonstration on how to perform both.  Flowsheet Row PULMONARY REHAB CHRONIC OBSTRUCTIVE PULMONARY DISEASE from 03/09/2024 in [facility], Vascular, & Lung [facility]  Date 03/09/24  Educator RN  Instruction Review Code 1- Verbalizes Understanding  Risk Factor Reduction Group instruction that is supported by a PowerPoint presentation. Instructor discusses the definition of a risk factor, different risk factors for pulmonary disease, and how the heart and lungs work together.   Pulmonary Diseases Group instruction provided by PowerPoint, verbal discussion, and written material to support subject matter. Instructor gives an overview of the different type of pulmonary diseases. There is also a discussion on risk factors and symptoms as well as ways to manage the diseases.   Stress and Energy Conservation Group instruction provided by PowerPoint, verbal discussion, and written material to support subject matter. Instructor gives an overview of stress and the impact it can have on the body. Instructor also reviews ways to reduce stress. There is also a discussion on energy conservation and ways to conserve energy throughout the day. Flowsheet Row PULMONARY REHAB CHRONIC OBSTRUCTIVE PULMONARY DISEASE from 03/16/2024 in [facility], [facility]  Date 03/16/24  Educator RN  Instruction Review Code 1- Verbalizes Understanding       Warning Signs and Symptoms Group instruction provided by PowerPoint, verbal discussion, and written material to support subject matter. Instructor reviews warning signs and symptoms of stroke, heart  attack, cold and flu. Instructor also reviews ways to prevent the spread of infection. Flowsheet Row PULMONARY REHAB CHRONIC OBSTRUCTIVE PULMONARY DISEASE from 03/23/2024 in [facility], Vascular, [facility]  Date 03/23/24  Educator RN  Instruction Review Code 1- Verbalizes Understanding       Other Education Group or individual verbal, written, or video instructions that support the educational goals of the pulmonary rehab program. Flowsheet Row PULMONARY REHAB CHRONIC OBSTRUCTIVE PULMONARY DISEASE from 07/17/2021 in [facility] for Heart, Vascular, & Lung Health  Date 07/17/21  Educator Handout  [How to beat a sedentary lifestyle]        Knowledge Questionnaire Score:  Knowledge Questionnaire Score - 02/18/24 1000       Knowledge Questionnaire Score   Pre Score 14/18             Core Components/Risk Factors/Patient Goals at Admission:  Personal Goals and Risk Factors at Admission - 02/18/24 0937       Core Components/Risk Factors/Patient Goals on Admission    Weight Management Yes;Weight Gain    Intervention Weight Management: Develop a combined nutrition and exercise program designed to reach desired caloric intake, while maintaining appropriate intake of nutrient and fiber, sodium and fats, and appropriate energy expenditure required for the weight goal.;Weight Management: Provide education and appropriate resources to help participant work on and attain dietary goals.;Weight Management/Obesity: Establish reasonable short term and long term weight goals.;Obesity: Provide education and appropriate resources to help participant work on and attain dietary goals.    Admit Weight 163 lb 12.8 oz (74.3 kg)    Expected Outcomes Short Term: Continue to assess and modify interventions until short term weight is achieved;Long Term: Adherence to nutrition and physical activity/exercise program aimed toward attainment of  established weight goal;Weight Gain: Understanding of general recommendations for a high calorie, high protein meal plan that promotes weight gain by distributing calorie intake throughout the day with the consumption for 4-5 meals, snacks, and/or supplements;Understanding recommendations for meals to include 15-35% energy as protein, 25-35% energy from fat, 35-60% energy from carbohydrates, less than 200mg  of dietary cholesterol, 20-35 gm of total fiber daily;Understanding of distribution of calorie intake throughout the day with the consumption of 4-5 meals/snacks  Improve shortness of breath with ADL's Yes    Intervention Provide education, individualized exercise plan and daily activity instruction to help decrease symptoms of SOB with activities of daily living.    Expected Outcomes Short Term: Improve cardiorespiratory fitness to achieve a reduction of symptoms when performing ADLs;Long Term: Be able to perform more ADLs without symptoms or delay the onset of symptoms             Core Components/Risk Factors/Patient Goals Review:   Goals and Risk Factor Review     Row Name 02/21/24 9528 03/22/24 1111           Core Components/Risk Factors/Patient Goals Review   Personal Goals Review Weight Management/Obesity;Improve shortness of breath with ADL's;Develop more efficient breathing techniques such as purse lipped breathing and diaphragmatic breathing and practicing self-pacing with activity. Weight Management/Obesity;Improve shortness of breath with ADL's;Develop more efficient breathing techniques such as purse lipped breathing and diaphragmatic breathing and practicing self-pacing with activity.      Review Abdullatif is scheduled to start PR on 02/22/24. Unable to assess his goals at this time. We will continue to monitor his progress throughout the program. Monthly review of patient's Core Components/Risk Factors/Patient Goals are as follows: Goal progressing for improving shortness of breath  with ADL's. Nazaire is currently exercising on RA  to maintain sats >88%. He is currently exercising on the treadmill and the recumbant elliptical. Goal met for developing more efficient breathing techniques such as purse lipped breathing and diaphragmatic breathing; and practicing self-pacing with activity. Cleo has attended the breathing techniques education and has been practicing diaphragmatic breathing at home. He is able to demonstrate purse lip breathing when he gets SOB as well as self pace based on the RPE/dyspnea scale. Goal progressing for weight gain . Zalen is working with staff dietitian to achieve his weight gain goals.      Expected Outcomes To improve shortness of breath with ADL's, develop more efficient breathing techniques such as purse lipped breathing and diaphragmatic breathing; and practicing self-pacing with activity and gain weight. To improve shortness of breath with ADL's  and gain weight.               Core Components/Risk Factors/Patient Goals at Discharge (Final Review):   Goals and Risk Factor Review - 03/22/24 1111       Core Components/Risk Factors/Patient Goals Review   Personal Goals Review Weight Management/Obesity;Improve shortness of breath with ADL's;Develop more efficient breathing techniques such as purse lipped breathing and diaphragmatic breathing and practicing self-pacing with activity.    Review Monthly review of patient's Core Components/Risk Factors/Patient Goals are as follows: Goal progressing for improving shortness of breath with ADL's. Linley is currently exercising on RA  to maintain sats >88%. He is currently exercising on the treadmill and the recumbant elliptical. Goal met for developing more efficient breathing techniques such as purse lipped breathing and diaphragmatic breathing; and practicing self-pacing with activity. Prabhnoor has attended the breathing techniques education and has been practicing diaphragmatic breathing at home. He is  able to demonstrate purse lip breathing when he gets SOB as well as self pace based on the RPE/dyspnea scale. Goal progressing for weight gain . Bartlett is working with staff dietitian to achieve his weight gain goals.    Expected Outcomes To improve shortness of breath with ADL's  and gain weight.             ITP Comments:Pt is making expected progress toward Pulmonary Rehab goals after completing 10  session(s). Recommend continued exercise, life style modification, education, and utilization of breathing techniques to increase stamina and strength, while also decreasing shortness of breath with exertion.  Dr. Genetta Kenning is Medical Director for Pulmonary Rehab at [facility].     Comments:

## 2024-03-30 ENCOUNTER — Encounter (HOSPITAL_COMMUNITY)
Admission: RE | Admit: 2024-03-30 | Discharge: 2024-03-30 | Disposition: A | Discharge status: Home / Self Care | Source: Ambulatory Visit | Attending: Pulmonary Disease

## 2024-03-30 DIAGNOSIS — J449 Chronic obstructive pulmonary disease, unspecified: Secondary | ICD-10-CM | POA: Diagnosis not present

## 2024-03-30 NOTE — Progress Notes (Signed)
 Daily Session Note  Patient Details  Name: Ryan Diaz MRN: [id number] Date of Birth: Apr 09, 1946 Referring Provider:   [name] Pulmonary Rehab Walk Test from 02/18/2024 in [facility], [facility]  Referring Provider [name]       Encounter Date: 03/30/2024  Check In:  Session Check In - 03/30/24 1029       Check-In   Supervising physician immediately available to respond to emergencies [facility] MD immediately available    Physician(s) Palmer Bobo, NP    Location [facility]    Staff Present Cindra Cree, RT;Randi Regis Captain BS, ACSM-CEP, Exercise Physiologist;Mary Arlester Ladd, RN, BSN;Johnny Porter, MS, Exercise Physiologist    Virtual Visit No    Medication changes reported     No    Fall or balance concerns reported    No    Tobacco Cessation No Change    Warm-up and Cool-down Performed as group-led instruction    Resistance Training Performed Yes    VAD Patient? No    PAD/SET Patient? No      Pain Assessment   Currently in Pain? No/denies    Multiple Pain Sites No             Capillary Blood Glucose: No results found for this or any previous visit (from the past 24 hours).    Social History   Tobacco Use  Smoking Status Former   Current packs/day: 0.00   Average packs/day: 1.5 packs/day for 35.0 years (52.5 ttl pk-yrs)   Types: Cigarettes   Start date: 09/04/1974   Quit date: 09/04/2009   Years since quitting: 14.5   Passive exposure: Past  Smokeless Tobacco Never    Goals Met:  Proper associated with RPD/PD & O2 Sat Independence with exercise equipment Exercise tolerated well No report of concerns or symptoms today Strength training completed today  Goals Unmet:  Not Applicable  Comments: Service time is from 1010 to 1140.    Dr. Genetta Kenning is Medical Director for Pulmonary Rehab at [facility].

## 2024-04-04 ENCOUNTER — Encounter (HOSPITAL_COMMUNITY)
Admission: RE | Admit: 2024-04-04 | Discharge: 2024-04-04 | Disposition: A | Discharge status: Home / Self Care | Source: Ambulatory Visit | Attending: Pulmonary Disease | Admitting: Pulmonary Disease

## 2024-04-04 ENCOUNTER — Telehealth (HOSPITAL_COMMUNITY): Payer: Self-pay

## 2024-04-04 DIAGNOSIS — J449 Chronic obstructive pulmonary disease, unspecified: Secondary | ICD-10-CM | POA: Diagnosis not present

## 2024-04-04 NOTE — Telephone Encounter (Signed)
 LVM for pt to inform him that elevators are down for Pulm Rehab.

## 2024-04-04 NOTE — Progress Notes (Signed)
 Daily Session Note  Patient Details  Name: Ryan Diaz MRN: [id number] Date of Birth: 01/12/1946 Referring Provider:   [name] Pulmonary Rehab Walk Test from 02/18/2024 in [facility], Vascular, [facility]  Referring Provider [name]       Encounter Date: 04/04/2024  Check In:  Session Check In - 04/04/24 1145       Check-In   Supervising physician immediately available to respond to emergencies [facility] MD immediately available    Physician(s) Palmer Bobo, NP    Location [facility]    Staff Present Cindra Cree, RT;Randi Regis Captain BS, ACSM-CEP, Exercise Physiologist;Mary Arlester Ladd, RN, Shasta Deist, MS, ACSM-CEP, Exercise Physiologist;Samantha Belarus, RD, LDN    Virtual Visit No    Medication changes reported     No    Fall or balance concerns reported    No    Tobacco Cessation No Change    Warm-up and Cool-down Performed as group-led instruction    Resistance Training Performed Yes    VAD Patient? No    PAD/SET Patient? No      Pain Assessment   Currently in Pain? No/denies    Multiple Pain Sites No             Capillary Blood Glucose: No results found for this or any previous visit (from the past 24 hours).    Social History   Tobacco Use  Smoking Status Former   Current packs/day: 0.00   Average packs/day: 1.5 packs/day for 35.0 years (52.5 ttl pk-yrs)   Types: Cigarettes   Start date: 09/04/1974   Quit date: 09/04/2009   Years since quitting: 14.5   Passive exposure: Past  Smokeless Tobacco Never    Goals Met:  Proper associated with RPD/PD & O2 Sat Independence with exercise equipment Exercise tolerated well No report of concerns or symptoms today Strength training completed today  Goals Unmet:  Not Applicable  Comments: Service time is from 1007 to 1127.    Dr. Genetta Kenning is Medical Director for Pulmonary Rehab at [facility].

## 2024-04-06 ENCOUNTER — Encounter (HOSPITAL_COMMUNITY)
Admission: RE | Admit: 2024-04-06 | Discharge: 2024-04-06 | Disposition: A | Discharge status: Home / Self Care | Source: Ambulatory Visit | Attending: Pulmonary Disease | Admitting: Pulmonary Disease

## 2024-04-06 DIAGNOSIS — J449 Chronic obstructive pulmonary disease, unspecified: Secondary | ICD-10-CM

## 2024-04-06 NOTE — Progress Notes (Signed)
 Daily Session Note  Patient Details  Name: Ryan Diaz MRN: [id number] Date of Birth: 08/10/1946 Referring Provider:   [name] Pulmonary Rehab Walk Test from 02/18/2024 in [facility]  Referring Provider [name]       Encounter Date: 04/06/2024  Check In:  Session Check In - 04/06/24 1056       Check-In   Supervising physician immediately available to respond to emergencies [facility] MD immediately available    Physician(s) Charles Connor, NP    Location [facility]    Staff Present Cindra Cree, RT;Randi Regis Captain BS, ACSM-CEP, Exercise Physiologist;Mary Arlester Ladd, RN, Shasta Deist, MS, ACSM-CEP, Exercise Physiologist;Samantha Belarus, RD, LDN    Virtual Visit No    Medication changes reported     No    Fall or balance concerns reported    No    Tobacco Cessation No Change    Warm-up and Cool-down Performed as group-led instruction    Resistance Training Performed Yes    VAD Patient? No    PAD/SET Patient? No      Pain Assessment   Currently in Pain? No/denies             Capillary Blood Glucose: No results found for this or any previous visit (from the past 24 hours).    Social History   Tobacco Use  Smoking Status Former   Current packs/day: 0.00   Average packs/day: 1.5 packs/day for 35.0 years (52.5 ttl pk-yrs)   Types: Cigarettes   Start date: 09/04/1974   Quit date: 09/04/2009   Years since quitting: 14.5   Passive exposure: Past  Smokeless Tobacco Never    Goals Met:  Proper associated with RPD/PD & O2 Sat Independence with exercise equipment Exercise tolerated well No report of concerns or symptoms today Strength training completed today  Goals Unmet:  Not Applicable  Comments: Service time is from 1008 to 1135.    Dr. Genetta Kenning is Medical Director for Pulmonary Rehab at [facility].

## 2024-04-11 ENCOUNTER — Encounter (HOSPITAL_COMMUNITY)
Admission: RE | Admit: 2024-04-11 | Discharge: 2024-04-11 | Disposition: A | Discharge status: Home / Self Care | Source: Ambulatory Visit | Attending: Pulmonary Disease

## 2024-04-11 VITALS — Wt 164.7 lb

## 2024-04-11 DIAGNOSIS — J449 Chronic obstructive pulmonary disease, unspecified: Secondary | ICD-10-CM | POA: Diagnosis not present

## 2024-04-11 NOTE — Progress Notes (Signed)
 Daily Session Note  Patient Details  Name: Ryan Diaz MRN: [id number] Date of Birth: 05-05-1946 Referring Provider:   [name] Pulmonary Rehab Walk Test from 02/18/2024 in [facility]  Referring Provider [name]       Encounter Date: 04/11/2024  Check In:  Session Check In - 04/11/24 1019       Check-In   Supervising physician immediately available to respond to emergencies [facility] MD immediately available    Physician(s) Charles Connor, NP    [facility]    Staff Present Sueellen Emery BS, ACSM-CEP, Exercise Physiologist;Mary Arlester Ladd, RN, Shasta Deist, MS, ACSM-CEP, Exercise Physiologist;Samantha Belarus, RD, LDN;Johnny Alexia Angelucci, MS, Exercise Physiologist    Virtual Visit No    Medication changes reported     No    Fall or balance concerns reported    No    Tobacco Cessation No Change    Warm-up and Cool-down Performed as group-led instruction    Resistance Training Performed Yes    VAD Patient? No    PAD/SET Patient? No      Pain Assessment   Currently in Pain? No/denies    Multiple Pain Sites No             Capillary Blood Glucose: No results found for this or any previous visit (from the past 24 hours).   Exercise Prescription Changes - 04/11/24 1200       Response to Exercise   Blood Pressure (Admit) 126/84    Blood Pressure (Exercise) 96/50    Blood Pressure (Exit) 104/56    Heart Rate (Admit) 84 bpm    Heart Rate (Exercise) 120 bpm    Heart Rate (Exit) 98 bpm    Oxygen  Saturation (Admit) 97 %    Oxygen  Saturation (Exercise) 95 %    Oxygen  Saturation (Exit) 96 %    Rating of Perceived Exertion (Exercise) 11    Perceived Dyspnea (Exercise) 2    Duration Continue with 30 min of aerobic exercise without signs/symptoms of physical distress.    Intensity THRR unchanged      Progression   Progression Continue to progress workloads to maintain intensity without  signs/symptoms of physical distress.    Average METs 4.8      Resistance Training   Training Prescription Yes    Weight blue bands    Reps 10-15    Time 10 Minutes      Treadmill   MPH 3    Grade 2    Minutes 15    METs 4      Recumbant Elliptical   Level 4    RPM 67    Minutes 15    METs 4.8             Social History   Tobacco Use  Smoking Status Former   Current packs/day: 0.00   Average packs/day: 1.5 packs/day for 35.0 years (52.5 ttl pk-yrs)   Types: Cigarettes   Start date: 09/04/1974   Quit date: 09/04/2009   Years since quitting: 14.6   Passive exposure: Past  Smokeless Tobacco Never    Goals Met:  Independence with exercise equipment Exercise tolerated well No report of concerns or symptoms today Strength training completed today  Goals Unmet:  Not Applicable  Comments: Service time is from 1005 to 1127.    Dr. Genetta Kenning is Medical Director for Pulmonary Rehab at [facility].

## 2024-04-13 ENCOUNTER — Encounter (HOSPITAL_COMMUNITY)
Admission: RE | Admit: 2024-04-13 | Discharge: 2024-04-13 | Disposition: A | Discharge status: Home / Self Care | Source: Ambulatory Visit | Attending: Pulmonary Disease | Admitting: Pulmonary Disease

## 2024-04-13 DIAGNOSIS — J449 Chronic obstructive pulmonary disease, unspecified: Secondary | ICD-10-CM | POA: Diagnosis not present

## 2024-04-13 NOTE — Progress Notes (Signed)
 Daily Session Note  Patient Details  Name: Ryan Diaz MRN: [id number] Date of Birth: 02/17/1946 Referring Provider:   [name] Pulmonary Rehab Walk Test from 02/18/2024 in [facility]  Referring Provider [name]       Encounter Date: 04/13/2024  Check In:  Session Check In - 04/13/24 1026       Check-In   Supervising physician immediately available to respond to emergencies [facility] MD immediately available    Physician(s) Marlana Silvan, NP    [facility]    Staff Present Sueellen Emery BS, ACSM-CEP, Exercise Physiologist;Mary Arlester Ladd, RN, Shasta Deist, MS, ACSM-CEP, Exercise Physiologist;Samantha Belarus, RD, Bryan Caprio, RT    Virtual Visit No    Medication changes reported     No    Fall or balance concerns reported    No    Tobacco Cessation No Change    Warm-up and Cool-down Performed as group-led instruction    Resistance Training Performed Yes    VAD Patient? No    PAD/SET Patient? No      Pain Assessment   Currently in Pain? No/denies             Capillary Blood Glucose: No results found for this or any previous visit (from the past 24 hours).    Social History   Tobacco Use  Smoking Status Former   Current packs/day: 0.00   Average packs/day: 1.5 packs/day for 35.0 years (52.5 ttl pk-yrs)   Types: Cigarettes   Start date: 09/04/1974   Quit date: 09/04/2009   Years since quitting: 14.6   Passive exposure: Past  Smokeless Tobacco Never    Goals Met:  Exercise tolerated well No report of concerns or symptoms today Strength training completed today  Goals Unmet:  Not Applicable  Comments: Service time is from 1004 to 1136.    Dr. Genetta Kenning is Medical Director for Pulmonary Rehab at [facility].

## 2024-04-18 ENCOUNTER — Encounter (HOSPITAL_COMMUNITY)
Admission: RE | Admit: 2024-04-18 | Discharge: 2024-04-18 | Disposition: A | Discharge status: Home / Self Care | Source: Ambulatory Visit | Attending: Pulmonary Disease | Admitting: Pulmonary Disease

## 2024-04-18 DIAGNOSIS — J449 Chronic obstructive pulmonary disease, unspecified: Secondary | ICD-10-CM | POA: Diagnosis present

## 2024-04-18 NOTE — Progress Notes (Signed)
 Daily Session Note  Patient Details  Name: Ryan Diaz MRN: [id number] Date of Birth: [birth year] Referring Provider:   [name] Pulmonary Rehab Walk Test from 02/18/2024 in [facility], [facility]  Referring Provider [name]       Encounter Date: 04/18/2024  Check In:  Session Check In - 04/18/24 1110       Check-In   Supervising physician immediately available to respond to emergencies [facility] immediately available    Physician(s) Koren Persons, NP    [facility]    Staff Present Sueellen Emery BS, ACSM-CEP, Exercise Physiologist;Kaylee Nolon Baxter, MS, ACSM-CEP, Exercise Physiologist;Samantha Belarus, RD, Bryan Caprio, RT    Virtual Visit No    Medication changes reported     No    Fall or balance concerns reported    No    Tobacco Cessation No Change    Warm-up and Cool-down Performed as group-led instruction    Resistance Training Performed Yes    VAD Patient? No    PAD/SET Patient? No      Pain Assessment   Currently in Pain? No/denies    Multiple Pain Sites No             Capillary Blood Glucose: No results found for this or any previous visit (from the past 24 hours).    Social History   Tobacco Use  Smoking Status Former   Current packs/day: 0.00   Average packs/day: 1.5 packs/day for 35.0 years (52.5 ttl pk-yrs)   Types: Cigarettes   Start date: 09/04/1974   Quit date: 09/04/2009   Years since quitting: 14.6   Passive exposure: Past  Smokeless Tobacco Never    Goals Met:  Proper associated with RPD/PD & O2 Sat Independence with exercise equipment Exercise tolerated well No report of concerns or symptoms today Strength training completed today  Goals Unmet:  Not Applicable  Comments: Service time is from 1005 to 1128.    Dr. Genetta Kenning is Medical Director for Pulmonary Rehab at [facility].

## 2024-04-20 ENCOUNTER — Encounter (HOSPITAL_COMMUNITY)
Admission: RE | Admit: 2024-04-20 | Discharge: 2024-04-20 | Disposition: A | Discharge status: Home / Self Care | Source: Ambulatory Visit | Attending: Pulmonary Disease

## 2024-04-20 DIAGNOSIS — J449 Chronic obstructive pulmonary disease, unspecified: Secondary | ICD-10-CM

## 2024-04-20 NOTE — Progress Notes (Signed)
 Daily Session Note  Patient Details  Name: Ryan Diaz MRN: [id number] Date of Birth: [birth year] Referring Provider:   [name] Pulmonary Rehab Walk Test from 02/18/2024 in [facility], [facility]  Referring Provider [name]       Encounter Date: 04/20/2024  Check In:  Session Check In - 04/20/24 1124       Check-In   Supervising physician immediately available to respond to emergencies [facility] immediately available    Physician(s) Palmer Bobo, NP    [facility]    Staff Present Sueellen Emery BS, ACSM-CEP, Exercise Physiologist;Kaylee Nolon Baxter, MS, ACSM-CEP, Exercise Physiologist;Samantha Belarus, RD, Bryan Caprio, RT    Virtual Visit No    Medication changes reported     No    Fall or balance concerns reported    No    Tobacco Cessation No Change    Warm-up and Cool-down Performed as group-led instruction    Resistance Training Performed Yes    VAD Patient? No    PAD/SET Patient? No      Pain Assessment   Currently in Pain? No/denies             Capillary Blood Glucose: No results found for this or any previous visit (from the past 24 hours).    Social History   Tobacco Use  Smoking Status Former   Current packs/day: 0.00   Average packs/day: 1.5 packs/day for 35.0 years (52.5 ttl pk-yrs)   Types: Cigarettes   Start date: 09/04/1974   Quit date: 09/04/2009   Years since quitting: 14.6   Passive exposure: Past  Smokeless Tobacco Never    Goals Met:  Proper associated with RPD/PD & O2 Sat Independence with exercise equipment Exercise tolerated well No report of concerns or symptoms today Strength training completed today  Goals Unmet:  Not Applicable  Comments: Service time is from 1005 to 1134.    Dr. Genetta Kenning is Medical Director for Pulmonary Rehab at [facility].

## 2024-04-25 ENCOUNTER — Encounter (HOSPITAL_COMMUNITY)
Admission: RE | Admit: 2024-04-25 | Discharge: 2024-04-25 | Disposition: A | Discharge status: Home / Self Care | Source: Ambulatory Visit | Attending: Pulmonary Disease

## 2024-04-25 VITALS — Wt 162.9 lb

## 2024-04-25 DIAGNOSIS — J449 Chronic obstructive pulmonary disease, unspecified: Secondary | ICD-10-CM

## 2024-04-25 NOTE — Progress Notes (Signed)
 Daily Session Note  Patient Details  Name: Ryan Diaz MRN: [id number] Date of Birth: [birth year] Referring Provider:   [name] Pulmonary Rehab Walk Test from 02/18/2024 in [facility] for [facility], [facility]ascular, [facility]  Referring Provider [name]       Encounter Date: 04/25/2024  Check In:  Session Check In - 04/25/24 1125       Check-In   Supervising physician immediately available to respond to emergencies [facility] MD immediately available    Physician(s) Palmer Bobo, NP    [facility]    Staff Present Sueellen Emery BS, ACSM-CEP, Exercise Physiologist;Kaylee Nolon Baxter, MS, ACSM-CEP, Exercise Physiologist;Samantha Belarus, RD, LDN;Casey Carmen Chol, RN, BSN    Virtual Visit No    Medication changes reported     No    Fall or balance concerns reported    No    Tobacco Cessation No Change    Warm-up and Cool-down Performed as group-led Writer Performed Yes    VAD Patient? No    PAD/SET Patient? No      Pain Assessment   Currently in Pain? No/denies    Multiple Pain Sites No             Capillary Blood Glucose: No results found for this or any previous visit (from the past 24 hours).   Exercise Prescription Changes - 04/25/24 1200       Response to Exercise   Blood Pressure (Admit) 124/64    Blood Pressure (Exercise) 156/80    Blood Pressure (Exit) 110/78    Heart Rate (Admit) 75 bpm    Heart Rate (Exercise) 112 bpm    Heart Rate (Exit) 83 bpm    Oxygen  Saturation (Admit) 96 %    Oxygen  Saturation (Exercise) 94 %    Oxygen  Saturation (Exit) 97 %    Rating of Perceived Exertion (Exercise) 11    Perceived Dyspnea (Exercise) 1    Duration Continue with 30 min of aerobic exercise without signs/symptoms of physical distress.    Intensity THRR unchanged      Progression   Progression Continue to progress workloads to maintain intensity without signs/symptoms of  physical distress.    Average METs 5.5      Resistance Training   Training Prescription Yes    Weight blue bands    Reps 10-15    Time 10 Minutes      Treadmill   MPH 3    Grade 2    Minutes 15    METs 4      Recumbant Elliptical   Level 4    RPM 74    Minutes 15    METs 5.5             Social History   Tobacco Use  Smoking Status Former   Current packs/day: 0.00   Average packs/day: 1.5 packs/day for 35.0 years (52.5 ttl pk-yrs)   Types: Cigarettes   Start date: 09/04/1974   Quit date: 09/04/2009   Years since quitting: 14.6   Passive exposure: Past  Smokeless Tobacco Never    Goals Met:  Exercise tolerated well No report of concerns or symptoms today Strength training completed today  Goals Unmet:  Not Applicable  Comments: Service time is from 1010 to 1142.    Dr. Genetta Kenning is Medical Director for Pulmonary Rehab at [facility].

## 2024-04-26 NOTE — Progress Notes (Signed)
 Pulmonary Individual Treatment Plan  Patient Details  Name: Ryan Diaz MRN: [id number] Date of Birth: Sep 04, 1946 Referring Provider:   [name] Pulmonary Rehab Walk Test from 02/18/2024 in [facility]  Referring Provider [name]       Initial Encounter Date:  Flowsheet Row Pulmonary Rehab Walk Test from 02/18/2024 in [facility]  Date 02/18/24       Visit Diagnosis: Chronic obstructive pulmonary disease, unspecified COPD type (HCC)  Patient's Home Medications on Admission:   Current Outpatient Medications:    albuterol  (ACCUNEB ) 0.63 MG/3ML nebulizer solution, Take 3 mLs (0.63 mg total) by nebulization every 6 (six) hours as needed for wheezing., Disp: 75 mL, Rfl: 12   albuterol  (VENTOLIN  HFA) 108 (90 Base) MCG/ACT inhaler, Inhale 2 puffs into the lungs every 6 (six) hours as needed for wheezing or shortness of breath., Disp: 18 g, Rfl: 12   aspirin  EC 81 MG tablet, Take 81 mg by mouth daily., Disp: , Rfl:    atorvastatin  (LIPITOR) 80 MG tablet, Take 1 tablet by mouth once daily, Disp: 90 tablet, Rfl: 2   cetirizine  (ZYRTEC ) 10 MG tablet, Take by mouth., Disp: , Rfl:    Cholecalciferol (VITAMIN D ) 50 MCG (2000 UT) tablet, Take 1 tablet (2,000 Units total) by mouth daily., Disp: 90 tablet, Rfl: 3   Cholecalciferol 50 MCG (2000 UT) TABS, Take 1 tablet by mouth daily., Disp: , Rfl:    ciclesonide (ALVESCO) 160 MCG/ACT inhaler, Inhale into the lungs., Disp: , Rfl:    clotrimazole  (LOTRIMIN ) 1 % cream, Apply 1 application topically 2 (two) times daily as needed (irritation)., Disp: , Rfl:    clotrimazole -betamethasone  (LOTRISONE ) cream, Apply 1 Application topically daily. Use as directed twice per day as needed, Disp: 30 g, Rfl: 0   cromolyn (OPTICROM) 4 % ophthalmic solution, Place 1 drop into both eyes 4 (four) times daily as needed., Disp: , Rfl:     cyanocobalamin  (VITAMIN B12) 1000 MCG tablet, Take 1 tablet (1,000 mcg total) by mouth daily., Disp: 90 tablet, Rfl: 3   docusate sodium (COLACE) 100 MG capsule, Take 100 mg by mouth daily as needed for mild constipation., Disp: , Rfl:    ferrous sulfate 324 MG TBEC, Take 324 mg by mouth. As needed, Disp: , Rfl:    fluticasone  (FLONASE ) 50 MCG/ACT nasal spray, Place into the nose., Disp: , Rfl:    Fluticasone -Umeclidin-Vilant (TRELEGY ELLIPTA ) 100-62.5-25 MCG/ACT AEPB, Inhale 1 puff then rinse mouth, once daily, Disp: 60 each, Rfl: 12   gabapentin  (NEURONTIN ) 100 MG capsule, Take 1 capsule (100 mg total) by mouth 3 (three) times daily., Disp: 90 capsule, Rfl: 5   hydrocortisone 2.5 % cream, SMARTSIG:Topical 1-2 Times Daily PRN, Disp: , Rfl:    ipratropium (ATROVENT ) 0.06 % nasal spray, Place 2 sprays into both nostrils 3 (three) times daily., Disp: 15 mL, Rfl: 5   levocetirizine (XYZAL  ALLERGY 24HR) 5 MG tablet, Take 1 tablet (5 mg total) by mouth every evening., Disp: 30 tablet, Rfl: 5   losartan  (COZAAR ) 100 MG tablet, Take 1 tablet (100 mg total) by mouth daily., Disp: 90 tablet, Rfl: 3   meloxicam  (MOBIC ) 15 MG tablet, 1 tab by mouth once daily as needed, Disp: 90 tablet, Rfl: 1   mirtazapine (REMERON) 7.5 MG tablet, Take by mouth., Disp: , Rfl:    nitroGLYCERIN  (NITROSTAT ) 0.4 MG SL tablet, Place 1 tablet (  0.4 mg total) under the tongue every 5 (five) minutes as needed for chest pain., Disp: 25 tablet, Rfl: 4   Omega-3 Fatty Acids (FISH OIL) 1000 MG CAPS, Take 1,000 mg by mouth 2 (two) times daily., Disp: , Rfl:    pantoprazole (PROTONIX) 40 MG tablet, TAKE ONE TABLET BY MOUTH ONCE EVERY DAY TO CONTROL STOMACH ACID, Disp: , Rfl:    pantoprazole (PROTONIX) 40 MG tablet, Take by mouth., Disp: , Rfl:    Spacer/Aero-Holding Chambers (AEROCHAMBER MV) inhaler, Use as instructed, Disp: 1 each, Rfl: 0   tadalafil (CIALIS) 20 MG tablet, Take 20 mg by mouth daily as needed for erectile dysfunction.,  Disp: , Rfl:    terazosin  (HYTRIN ) 2 MG capsule, Take 1 capsule (2 mg total) by mouth at bedtime., Disp: 90 capsule, Rfl: 3   Tiotropium Bromide-Olodaterol 2.5-2.5 MCG/ACT AERS, Inhale into the lungs., Disp: , Rfl:    traZODone (DESYREL) 50 MG tablet, Take 50 mg by mouth at bedtime as needed for sleep., Disp: , Rfl:    triamcinolone  (NASACORT ) 55 MCG/ACT AERO nasal inhaler, Place 2 sprays into the nose daily., Disp: 1 each, Rfl: 12  Past Medical History: Past Medical History:  Diagnosis Date   ABUSE, ALCOHOL, CONTINUOUS 08/08/2007   ALLERGIC RHINITIS 08/08/2007   ANXIETY 08/08/2007   Carpal tunnel syndrome    COPD 10/03/2009   DEPRESSION 08/08/2007   ERECTILE DYSFUNCTION 08/08/2007   GENITAL HERPES, HX OF 08/08/2007   GERD 08/08/2007   GLUCOSE INTOLERANCE 08/08/2007   HYPERLIPIDEMIA 08/08/2007   HYPERTENSION 08/08/2007   Impaired glucose tolerance 09/26/2011   Insomnia 05/18/2012   PAIN IN SOFT TISSUES OF LIMB 10/02/2009   Sleep apnea    OSA-uses CPAP nightly    Tobacco Use: Social History   Tobacco Use  Smoking Status Former   Current packs/day: 0.00   Average packs/day: 1.5 packs/day for 35.0 years (52.5 ttl pk-yrs)   Types: Cigarettes   Start date: 09/04/1974   Quit date: 09/04/2009   Years since quitting: 14.6   Passive exposure: Past  Smokeless Tobacco Never    Labs: Review Flowsheet  More data exists      Latest Ref Rng & Units 06/23/2022 07/31/2022 11/24/2022 06/18/2023 11/16/2023  Labs for ITP Cardiac and Pulmonary Rehab  Cholestrol 0 - 200 mg/dL 914  782  956  213  086   LDL (calc) 0 - 99 mg/dL 69  65  44  71  59   HDL-C >39.00 mg/dL 57.84  69.62  95.28  41.32  34.00   Trlycerides 0.0 - 149.0 mg/dL 44.0  102.7  253.6  644.0  81.0   Hemoglobin A1c 4.6 - 6.5 % 6.0  5.8  5.9  5.8  6.0     Capillary Blood Glucose: No results found for: GLUCAP   Pulmonary Assessment Scores:  Pulmonary Assessment Scores     Row Name 02/18/24 1001         ADL UCSD   ADL Phase  Entry     SOB Score total 34       CAT Score   CAT Score 14       mMRC Score   mMRC Score 1             UCSD: Self-administered rating of dyspnea associated with activities of daily living (ADLs) 6-point scale (0 = not at all to 5 = maximal or unable to do because of breathlessness)  Scoring Scores range from 0 to 120.  Minimally important  difference is 5 units  CAT: CAT can identify the health impairment of COPD patients and is better correlated with disease progression.  CAT has a scoring range of zero to 40. The CAT score is classified into four groups of low (less than 10), medium (10 - 20), high (21-30) and very high (31-40) based on the impact level of disease on health status. A CAT score over 10 suggests significant symptoms.  A worsening CAT score could be explained by an exacerbation, poor medication adherence, poor inhaler technique, or progression of COPD or comorbid conditions.  CAT MCID is 2 points  mMRC: mMRC (Modified Medical Research Council) Dyspnea Scale is used to assess the degree of baseline functional disability in patients of respiratory disease due to dyspnea. No minimal important difference is established. A decrease in score of 1 point or greater is considered a positive change.   Pulmonary Function Assessment:  Pulmonary Function Assessment - 02/18/24 0943       Breath   Bilateral Breath Sounds Clear;Decreased    Shortness of Breath Yes;Fear of Shortness of Breath;Limiting activity;Panic with Shortness of Breath             Exercise Target Goals: Exercise Program Goal: Individual exercise prescription set using results from initial 6 min walk test and THRR while considering  patient's activity barriers and safety.   Exercise Prescription Goal: Initial exercise prescription builds to 30-45 minutes a day of aerobic activity, 2-3 days per week.  Home exercise guidelines will be given to patient during program as part of exercise prescription  that the participant will acknowledge.  Activity Barriers & Risk Stratification:  Activity Barriers & Cardiac Risk Stratification - 02/18/24 0941       Activity Barriers & Cardiac Risk Stratification   Activity Barriers Deconditioning;Muscular Weakness;Shortness of Breath;Arthritis;Balance Concerns    Cardiac Risk Stratification Moderate             6 Minute Walk:  6 Minute Walk     Row Name 02/18/24 1027         6 Minute Walk   Phase Initial     Distance 1295 feet     Walk Time 6 minutes     # of Rest Breaks 0     MPH 2.45     METS 2.66     RPE 11     Perceived Dyspnea  1     VO2 Peak 9.32     Symptoms No     Resting HR 89 bpm     Resting BP 111/65     Resting Oxygen  Saturation  98 %     Exercise Oxygen  Saturation  during 6 min walk 95 %     Max Ex. HR 106 bpm     Max Ex. BP 128/70     2 Minute Post BP 102/60       Interval HR   1 Minute HR 97     2 Minute HR 105     3 Minute HR 101     4 Minute HR 101     5 Minute HR 106     6 Minute HR 106     2 Minute Post HR 83     Interval Heart Rate? Yes       Interval Oxygen    Interval Oxygen ? Yes     Baseline Oxygen  Saturation % 98 %     1 Minute Oxygen  Saturation % 97 %     1 Minute Liters  of Oxygen  0 L     2 Minute Oxygen  Saturation % 95 %     2 Minute Liters of Oxygen  0 L     3 Minute Oxygen  Saturation % 97 %     3 Minute Liters of Oxygen  0 L     4 Minute Oxygen  Saturation % 95 %     4 Minute Liters of Oxygen  0 L     5 Minute Oxygen  Saturation % 966 %     5 Minute Liters of Oxygen  0 L     6 Minute Oxygen  Saturation % 96 %     6 Minute Liters of Oxygen  0 L     2 Minute Post Oxygen  Saturation % 98 %     2 Minute Post Liters of Oxygen  0 L              Oxygen  Initial Assessment:  Oxygen  Initial Assessment - 02/18/24 0943       Home Oxygen    Home Oxygen  Device None    Sleep Oxygen  Prescription CPAP    Home Exercise Oxygen  Prescription None    Home Resting Oxygen  Prescription None       Initial 6 min Walk   Oxygen  Used None      Program Oxygen  Prescription   Program Oxygen  Prescription None      Intervention   Short Term Goals To learn and understand importance of monitoring SPO2 with pulse oximeter and demonstrate accurate use of the pulse oximeter.;To learn and understand importance of maintaining oxygen  saturations>88%;To learn and demonstrate proper pursed lip breathing techniques or other breathing techniques. ;To learn and demonstrate proper use of respiratory medications    Long  Term Goals Maintenance of O2 saturations>88%;Compliance with respiratory medication;Verbalizes importance of monitoring SPO2 with pulse oximeter and return demonstration;Exhibits proper breathing techniques, such as pursed lip breathing or other method taught during program session;Demonstrates proper use of MDI's             Oxygen  Re-Evaluation:  Oxygen  Re-Evaluation     Row Name 02/23/24 0849 03/24/24 0939 04/21/24 0823         Program Oxygen  Prescription   Program Oxygen  Prescription None None None       Home Oxygen    Home Oxygen  Device None None None     Sleep Oxygen  Prescription CPAP CPAP CPAP     Home Exercise Oxygen  Prescription None None None     Home Resting Oxygen  Prescription None None None       Goals/Expected Outcomes   Short Term Goals To learn and understand importance of monitoring SPO2 with pulse oximeter and demonstrate accurate use of the pulse oximeter.;To learn and understand importance of maintaining oxygen  saturations>88%;To learn and demonstrate proper pursed lip breathing techniques or other breathing techniques. ;To learn and demonstrate proper use of respiratory medications To learn and understand importance of monitoring SPO2 with pulse oximeter and demonstrate accurate use of the pulse oximeter.;To learn and understand importance of maintaining oxygen  saturations>88%;To learn and demonstrate proper pursed lip breathing techniques or other breathing  techniques. ;To learn and demonstrate proper use of respiratory medications To learn and understand importance of monitoring SPO2 with pulse oximeter and demonstrate accurate use of the pulse oximeter.;To learn and understand importance of maintaining oxygen  saturations>88%;To learn and demonstrate proper pursed lip breathing techniques or other breathing techniques. ;To learn and demonstrate proper use of respiratory medications     Long  Term Goals Maintenance of O2 saturations>88%;Compliance with respiratory medication;Verbalizes importance of  monitoring SPO2 with pulse oximeter and return demonstration;Exhibits proper breathing techniques, such as pursed lip breathing or other method taught during program session;Demonstrates proper use of MDI's Maintenance of O2 saturations>88%;Compliance with respiratory medication;Verbalizes importance of monitoring SPO2 with pulse oximeter and return demonstration;Exhibits proper breathing techniques, such as pursed lip breathing or other method taught during program session;Demonstrates proper use of MDI's Maintenance of O2 saturations>88%;Compliance with respiratory medication;Verbalizes importance of monitoring SPO2 with pulse oximeter and return demonstration;Exhibits proper breathing techniques, such as pursed lip breathing or other method taught during program session;Demonstrates proper use of MDI's     Goals/Expected Outcomes Compliance and understanding of oxygen  saturation monitoring and breathing techniques to decrease shortness of breath. Compliance and understanding of oxygen  saturation monitoring and breathing techniques to decrease shortness of breath. Compliance and understanding of oxygen  saturation monitoring and breathing techniques to decrease shortness of breath.              Oxygen  Discharge (Final Oxygen  Re-Evaluation):  Oxygen  Re-Evaluation - 04/21/24 0823       Program Oxygen  Prescription   Program Oxygen  Prescription None      Home  Oxygen    Home Oxygen  Device None    Sleep Oxygen  Prescription CPAP    Home Exercise Oxygen  Prescription None    Home Resting Oxygen  Prescription None      Goals/Expected Outcomes   Short Term Goals To learn and understand importance of monitoring SPO2 with pulse oximeter and demonstrate accurate use of the pulse oximeter.;To learn and understand importance of maintaining oxygen  saturations>88%;To learn and demonstrate proper pursed lip breathing techniques or other breathing techniques. ;To learn and demonstrate proper use of respiratory medications    Long  Term Goals Maintenance of O2 saturations>88%;Compliance with respiratory medication;Verbalizes importance of monitoring SPO2 with pulse oximeter and return demonstration;Exhibits proper breathing techniques, such as pursed lip breathing or other method taught during program session;Demonstrates proper use of MDI's    Goals/Expected Outcomes Compliance and understanding of oxygen  saturation monitoring and breathing techniques to decrease shortness of breath.             Initial Exercise Prescription:  Initial Exercise Prescription - 02/18/24 1000       Date of Initial Exercise RX and Referring Provider   Date 02/18/24    Referring Provider Ivey Marlin   Expected Discharge Date 05/16/24      Treadmill   MPH 2.5    Grade 0    Minutes 15    METs 2.91      Recumbant Elliptical   Level 2    RPM 253    Watts 50    Minutes 15    METs 2.5      Prescription Details   Frequency (times per week) 2    Duration Progress to 30 minutes of continuous aerobic without signs/symptoms of physical distress      Intensity   THRR 40-80% of Max Heartrate 57-114    Ratings of Perceived Exertion 11-13    Perceived Dyspnea 0-4      Progression   Progression Continue to progress workloads to maintain intensity without signs/symptoms of physical distress.      Resistance Training   Training Prescription Yes    Weight blue bands     Reps 10-15             Perform Capillary Blood Glucose checks as needed.  Exercise Prescription Changes:   Exercise Prescription Changes     Row Name 02/29/24 1200 03/14/24  1100 03/23/24 1146 04/11/24 1200 04/25/24 1200     Response to Exercise   Blood Pressure (Admit) 104/58 112/62 120/60 126/84 124/64   Blood Pressure (Exercise) 128/56 122/62 -- 96/50 156/80   Blood Pressure (Exit) 96/58 130/60 98/58 104/56 110/78   Heart Rate (Admit) 78 bpm 70 bpm 70 bpm 84 bpm 75 bpm   Heart Rate (Exercise) 116 bpm 105 bpm 107 bpm 120 bpm 112 bpm   Heart Rate (Exit) 92 bpm 84 bpm 87 bpm 98 bpm 83 bpm   Oxygen  Saturation (Admit) 97 % 96 % 96 % 97 % 96 %   Oxygen  Saturation (Exercise) 95 % 94 % 95 % 95 % 94 %   Oxygen  Saturation (Exit) 94 % 96 % 96 % 96 % 97 %   Rating of Perceived Exertion (Exercise) 13 10 9 11 11    Perceived Dyspnea (Exercise) 3 1 1 2 1    Duration Continue with 30 min of aerobic exercise without signs/symptoms of physical distress. Continue with 30 min of aerobic exercise without signs/symptoms of physical distress. Continue with 30 min of aerobic exercise without signs/symptoms of physical distress. Continue with 30 min of aerobic exercise without signs/symptoms of physical distress. Continue with 30 min of aerobic exercise without signs/symptoms of physical distress.   Intensity THRR unchanged THRR unchanged THRR unchanged THRR unchanged THRR unchanged     Progression   Progression -- -- Continue to progress workloads to maintain intensity without signs/symptoms of physical distress. Continue to progress workloads to maintain intensity without signs/symptoms of physical distress. Continue to progress workloads to maintain intensity without signs/symptoms of physical distress.   Average METs -- -- 5 4.8 5.5     Resistance Training   Training Prescription Yes Yes Yes Yes Yes   Weight blue bands blue bands blue bands blue bands blue bands   Reps 10-15 10-15 10-15 10-15 10-15    Time 10 Minutes 10 Minutes 10 Minutes 10 Minutes 10 Minutes     Treadmill   MPH 2.5 2.5 2.7 3 3    Grade 1 1.5 2 2 2    Minutes 15 15 15 15 15    METs 3.1 3.41 3.7 4 4      Recumbant Elliptical   Level 3 3 4 4 4    RPM 64 68 68 67 74   Watts 92 -- -- -- --   Minutes 15 15 15 15 15    METs 4.3 5.1 5 4.8 5.5            Exercise Comments:   Exercise Comments     Row Name 02/22/24 1528 03/23/24 1528         Exercise Comments Pt completed first day of exercise. Nil averaged 2.7 METs at 2.4 mph on the treadmill and 5.4 METs at level 2 on the recumbent elliptical. He performed the warmup and cooldown standing without limitations. Discussed METs. Completed home exercise plan Safir is not currently exercising outside of rehab. He has access to the [facility]. I encouraged him to attend the [facility] and walk on the treadmill or ride the bike. I recommended attending the [facility] 1 non-rehab day/wk for 30 min/day. He agreed with my recommendations. Lexus seems motivated to exercise outside of rehab.               Exercise Goals and Review:   Exercise Goals     Row Name 02/18/24 [phone]             Exercise Goals   Increase Physical  Activity Yes       Intervention Provide advice, education, support and counseling about physical activity/exercise needs.;Develop an individualized exercise prescription for aerobic and resistive training based on initial evaluation findings, risk stratification, comorbidities and participant's personal goals.       Expected Outcomes Short Term: Attend rehab on a regular basis to increase amount of physical activity.;Long Term: Add in home exercise to make exercise part of routine and to increase amount of physical activity.;Long Term: Exercising regularly at least 3-5 days a week.       Increase Strength and Stamina Yes       Intervention Provide advice, education, support and counseling about physical activity/exercise needs.;Develop an individualized exercise  prescription for aerobic and resistive training based on initial evaluation findings, risk stratification, comorbidities and participant's personal goals.       Expected Outcomes Short Term: Increase workloads from initial exercise prescription for resistance, speed, and METs.;Short Term: Perform resistance training exercises routinely during rehab and add in resistance training at home;Long Term: Improve cardiorespiratory fitness, muscular endurance and strength as measured by increased METs and functional capacity ( )       Able to understand and use rate of perceived exertion (RPE) scale Yes       Intervention Provide education and explanation on how to use RPE scale       Expected Outcomes Short Term: Able to use RPE daily in rehab to express subjective intensity level;Long Term:  Able to use RPE to guide intensity level when exercising independently       Able to understand and use Dyspnea scale Yes       Intervention Provide education and explanation on how to use Dyspnea scale       Expected Outcomes Short Term: Able to use Dyspnea scale daily in rehab to express subjective sense of shortness of breath during exertion;Long Term: Able to use Dyspnea scale to guide intensity level when exercising independently       Knowledge and understanding of Target Heart Rate Range (THRR) Yes       Intervention Provide education and explanation of THRR including how the numbers were predicted and where they are located for reference       Expected Outcomes Short Term: Able to state/look up THRR;Long Term: Able to use THRR to govern intensity when exercising independently;Short Term: Able to use daily as guideline for intensity in rehab       Understanding of Exercise Prescription Yes       Intervention Provide education, explanation, and written materials on patient's individual exercise prescription       Expected Outcomes Short Term: Able to explain program exercise prescription;Long Term: Able to explain  home exercise prescription to exercise independently                Exercise Goals Re-Evaluation :  Exercise Goals Re-Evaluation     Row Name 02/23/24 0842 03/24/24 0937 04/21/24 0820         Exercise Goal Re-Evaluation   Exercise Goals Review Increase Physical Activity;Able to understand and use Dyspnea scale;Understanding of Exercise Prescription;Increase Strength and Stamina;Knowledge and understanding of Target Heart Rate Range (THRR);Able to understand and use rate of perceived exertion (RPE) scale Increase Physical Activity;Able to understand and use Dyspnea scale;Understanding of Exercise Prescription;Increase Strength and Stamina;Knowledge and understanding of Target Heart Rate Range (THRR);Able to understand and use rate of perceived exertion (RPE) scale Increase Physical Activity;Able to understand and use Dyspnea scale;Understanding of Exercise Prescription;Increase Strength and  Stamina;Knowledge and understanding of Target Heart Rate Range (THRR);Able to understand and use rate of perceived exertion (RPE) scale     Comments Ryshawn has completed 1 exercise session. He averages 2.7 METs at 2.4 mph on the treadmill and 5.4 METs at level 2 on the recumbent elliptical. Evon performs the warmup and cooldown standing without limitations. It is too soon to notate any discernable progressions. Will continue to monitor and progress as able. Shloma has completed 10 exercise sessions. He averages 3.7 METs at 2.7 mph and 2% incline on the treadmill and 5 METs at level 4 on the recumbent elliptical. Willim performs the warmup and cooldown standing without limitations. He has increased his speed and incline on the treadmill and level on the Nustep. He tolerates progressions well. Jeren does need encouragement to increase his levels. We have recently discussed home exercise as I encouraged Russel to attend the [facility] weekly. Will continue to monitor and progress as able. [place] has completed 17  exercise sessions. He averages 4 METs at 3 mph and 2% incline on the treadmill and 5.3 METs at level 4 on the recumbent elliptical. Izaan performs the warmup and cooldown standing without limitations. He has increased his speed on the treadmill as METs have slightly increased. His level on the recumbent elliptical has remained the same, although speed has increased. We have discussed home exercise as I encouraged Merton to exercise at the [facility]. Will continue to monitor and progress as able.     Expected Outcomes Through exercise at rehab and home, the patient will decrease shortness of breath with daily activities and feel confident carrying out an exercise regimn at home. Through exercise at rehab and home, the patient will decrease shortness of breath with daily activities and feel confident carrying out an exercise regimn at home. Through exercise at rehab and home, the patient will decrease shortness of breath with daily activities and feel confident carrying out an exercise regimn at home.              Discharge Exercise Prescription (Final Exercise Prescription Changes):  Exercise Prescription Changes - 04/25/24 1200       Response to Exercise   Blood Pressure (Admit) 124/64    Blood Pressure (Exercise) 156/80    Blood Pressure (Exit) 110/78    Heart Rate (Admit) 75 bpm    Heart Rate (Exercise) 112 bpm    Heart Rate (Exit) 83 bpm    Oxygen  Saturation (Admit) 96 %    Oxygen  Saturation (Exercise) 94 %    Oxygen  Saturation (Exit) 97 %    Rating of Perceived Exertion (Exercise) 11    Perceived Dyspnea (Exercise) 1    Duration Continue with 30 min of aerobic exercise without signs/symptoms of physical distress.    Intensity THRR unchanged      Progression   Progression Continue to progress workloads to maintain intensity without signs/symptoms of physical distress.    Average METs 5.5      Resistance Training   Training Prescription Yes    Weight blue bands    Reps 10-15    Time  10 Minutes      Treadmill   MPH 3    Grade 2    Minutes 15    METs 4      Recumbant Elliptical   Level 4    RPM 74    Minutes 15    METs 5.5             Nutrition:  Target Goals: Understanding of nutrition guidelines, daily intake of sodium 1500mg , cholesterol 200mg , calories 30% from fat and 7% or less from saturated fats, daily to have 5 or more servings of fruits and vegetables.  Biometrics:    Nutrition Therapy Plan and Nutrition Goals:  Nutrition Therapy & Goals - 04/20/24 1012       Nutrition Therapy   Diet General Healthy Diet    Drug/Food Interactions Statins/Certain Fruits      Personal Nutrition Goals   Nutrition Goal Patient to identify strategies for weight gain of 0.5-2.0# per week.   goal not met.   Personal Goal #2 --   goal in progress.   Comments Goal not met. Kaiyon has medical history of HTN, CAD, OSA, dementia, CKD3, hyperlipidemia, alcohol abuse. He is motivated to gain ~5# to 170#: patient is down 1.5# since starting with our program. BMI is appropriate for age at this time; will continue to monitor weight. We have discussed strategies for weight gain including increasing eating frequency, calorie density etc. He continues to complete group therapy weekly through the [place]. Millard will continue to benefit from participation in pulmonary rehab for nutrition, exercise, and lifestyle modification.      Intervention Plan   Intervention Prescribe, educate and counsel regarding individualized specific dietary modifications aiming towards targeted core components such as weight, hypertension, lipid management, diabetes, heart failure and other comorbidities.;Nutrition handout(s) given to patient.    Expected Outcomes Short Term Goal: Understand basic principles of dietary content, such as calories, fat, sodium, cholesterol and nutrients.;Long Term Goal: Adherence to prescribed nutrition plan.             Nutrition Assessments:  MEDIFICTS Score  Key: >=70 Need to make dietary changes  40-70 Heart Healthy Diet <= 40 Therapeutic Level Cholesterol Diet  Flowsheet Row PULMONARY REHAB CHRONIC OBSTRUCTIVE PULMONARY DISEASE from 07/17/2021 in [facility]  Picture Your Plate Total Score on Discharge 53      Picture Your Plate Scores: <82 Unhealthy dietary pattern with much room for improvement. 41-50 Dietary pattern unlikely to meet recommendations for good health and room for improvement. 51-60 More healthful dietary pattern, with some room for improvement.  >60 Healthy dietary pattern, although there may be some specific behaviors that could be improved.    Nutrition Goals Re-Evaluation:  Nutrition Goals Re-Evaluation     Row Name 02/24/24 1102 03/23/24 1205 04/20/24 1012         Goals   Current Weight 163 lb 12.8 oz (74.3 kg) 164 lb 10.9 oz (74.7 kg) 162 lb 4.1 oz (73.6 kg)     Comment Thiamine WNL, HDL 34, LDL 59,A1C 6.0, GFR 49, Cr 1.38 Thiamine WNL, HDL 34, LDL 59,A1C 6.0, GFR 49, Cr 1.38 Thiamine WNL, HDL 34, LDL 59,A1C 6.0, GFR 49, Cr 1.38     Expected Outcome Ruddy has medical history of HTN, CAD, OSA, dementia, CKD3, hyperlipidemia, alcohol abuse. He is motivated to gain ~5# to 170#. We discussed strategies for weight gain including increasing eating frequency, etc. He continues to complete group therapy weekly through the [place]. Jakyle will continue to benefit from participation in pulmonary rehab for nutrition, exercise, and lifestyle modification. Goal not met at this time. Poseidon has medical history of HTN, CAD, OSA, dementia, CKD3, hyperlipidemia, alcohol abuse. He is motivated to gain ~5# to 170#. We discussed strategies for weight gain including increasing eating frequency, etc. He has maintained his weight since starting with our  program. He continues to complete group therapy weekly through the [place]. Kordell will continue to benefit from participation in pulmonary  rehab for nutrition, exercise, and lifestyle modification. Goal not met. Nicanor has medical history of HTN, CAD, OSA, dementia, CKD3, hyperlipidemia, alcohol abuse. He is motivated to gain ~5# to 170#: patient is down 1.5# since starting with our program. BMI is appropriate for age at this time; will continue to monitor weight. We have discussed strategies for weight gain including increasing eating frequency, calorie density etc. He continues to complete group therapy weekly through the [place]. Deryck will continue to benefit from participation in pulmonary rehab for nutrition, exercise, and lifestyle modification.              Nutrition Goals Discharge (Final Nutrition Goals Re-Evaluation):  Nutrition Goals Re-Evaluation - 04/20/24 1012       Goals   Current Weight 162 lb 4.1 oz (73.6 kg)    Comment Thiamine WNL, HDL 34, LDL 59,A1C 6.0, GFR 49, Cr 1.38    Expected Outcome Goal not met. Quayshaun has medical history of HTN, CAD, OSA, dementia, CKD3, hyperlipidemia, alcohol abuse. He is motivated to gain ~5# to 170#: patient is down 1.5# since starting with our program. BMI is appropriate for age at this time; will continue to monitor weight. We have discussed strategies for weight gain including increasing eating frequency, calorie density etc. He continues to complete group therapy weekly through the [place]. Gracyn will continue to benefit from participation in pulmonary rehab for nutrition, exercise, and lifestyle modification.             Psychosocial: Target Goals: Acknowledge presence or absence of significant depression and/or stress, maximize coping skills, provide positive support system. Participant is able to verbalize types and ability to use techniques and skills needed for reducing stress and depression.  Initial Review & Psychosocial Screening:  Initial Psych Review & Screening - 02/18/24 0934       Initial Review   Current issues with None Identified      Family Dynamics   Good  Support System? Yes      Barriers   Psychosocial barriers to participate in program There are no identifiable barriers or psychosocial needs.      Screening Interventions   Interventions Encouraged to exercise             Quality of Life Scores:  Scores of 19 and below usually indicate a poorer quality of life in these areas.  A difference of  2-3 points is a clinically meaningful difference.  A difference of 2-3 points in the total score of the Quality of Life Index has been associated with significant improvement in overall quality of life, self-image, physical symptoms, and general health in studies assessing change in quality of life.  PHQ-9: Review Flowsheet  More data exists      03/23/2024 02/18/2024 12/22/2023 11/16/2023 06/21/2023  Depression screen PHQ 2/9  Decreased Interest 0 0 0 0 1  Down, Depressed, Hopeless 0 0 0 0 0  PHQ - 2 Score 0 0 0 0 1  Altered sleeping 0 1 - - -  Tired, decreased energy 0 1 - - -  Change in appetite 0 1 - - -  Feeling bad or failure about yourself  0 0 - - -  Trouble concentrating 0 0 - - -  Moving slowly or fidgety/restless 3 0 - - -  Suicidal thoughts 0 0 - - -  PHQ-9 Score 3 3 - - -  Difficult doing work/chores Not difficult at all Somewhat difficult - - -   Interpretation of Total Score  Total Score Depression Severity:  1-4 = Minimal depression, 5-9 = Mild depression, 10-14 = Moderate depression, 15-19 = Moderately severe depression, 20-27 = Severe depression   Psychosocial Evaluation and Intervention:  Psychosocial Evaluation - 02/18/24 1002       Psychosocial Evaluation & Interventions   Interventions Encouraged to exercise with the program and follow exercise prescription    Comments No psychosocial concerns identified at this time    Expected Outcomes For Ly to participate in PR free of any psychosocial barriers or concerns    Continue Psychosocial Services  No Follow up required             Psychosocial  Re-Evaluation:  Psychosocial Re-Evaluation     Row Name 02/21/24 0926 03/22/24 1110 04/19/24 1124         Psychosocial Re-Evaluation   Current issues with None Identified None Identified None Identified     Comments Lillian is scheduled to start PR on 02/22/24. No new psychosocial barriers or concerns since orientation on 02/18/24. Lenard denies any new psychosocial barriers or concerns at this time. He continues to attend weekly therapy through the [place]. Jove is doing great in the program. He enjoys coming to class and has even made some friends. He denies any new barriers or concerns at this time.     Expected Outcomes For Kamaree to participate in PR free of any psychosocial barriers or concerns For Tatum to participate in PR free of any psychosocial barriers or concerns For Hamdan to participate in PR free of any psychosocial barriers or concerns     Interventions Encouraged to attend Pulmonary Rehabilitation for the exercise Encouraged to attend Pulmonary Rehabilitation for the exercise Encouraged to attend Pulmonary Rehabilitation for the exercise     Continue Psychosocial Services  No Follow up required No Follow up required No Follow up required              Psychosocial Discharge (Final Psychosocial Re-Evaluation):  Psychosocial Re-Evaluation - 04/19/24 1124       Psychosocial Re-Evaluation   Current issues with None Identified    Comments Karla is doing great in the program. He enjoys coming to class and has even made some friends. He denies any new barriers or concerns at this time.    Expected Outcomes For Abhimanyu to participate in PR free of any psychosocial barriers or concerns    Interventions Encouraged to attend Pulmonary Rehabilitation for the exercise    Continue Psychosocial Services  No Follow up required             Education: Education Goals: Education classes will be provided on a weekly basis, covering required topics. Participant will state  understanding/return demonstration of topics presented.  Learning Barriers/Preferences:  Learning Barriers/Preferences - 02/18/24 0936       Learning Barriers/Preferences   Learning Barriers Sight   wears glasses   Learning Preferences Audio;Computer/Internet;Group Instruction;Individual Instruction;Pictoral;Skilled Demonstration;Written Material             Education Topics: Know Your Numbers Group instruction that is supported by a PowerPoint presentation. Instructor discusses importance of knowing and understanding resting, exercise, and post-exercise oxygen  saturation, heart rate, and blood pressure. Oxygen  saturation, heart rate, blood pressure, rating of perceived exertion, and dyspnea are reviewed along with a normal range for these values.    Exercise for the Pulmonary Patient Group instruction that is supported by a  PowerPoint presentation. Instructor discusses benefits of exercise, core components of exercise, frequency, duration, and intensity of an exercise routine, importance of utilizing pulse oximetry during exercise, safety while exercising, and options of places to exercise outside of rehab.  Flowsheet Row PULMONARY REHAB CHRONIC OBSTRUCTIVE PULMONARY DISEASE from 07/17/2021 in [facility] for [facility], Vascular, & Lung [facility]  Date 06/12/21  Educator Jess-Handout  [METS]  Instruction Review Code 1- Verbalizes Understanding       MET Level  Group instruction provided by PowerPoint, verbal discussion, and written material to support subject matter. Instructor reviews what METs are and how to increase METs.  Flowsheet Row PULMONARY REHAB CHRONIC OBSTRUCTIVE PULMONARY DISEASE from 04/06/2024 in [facility] for Heart, Vascular, & Lung Health  Date 04/06/24  Educator EP  Instruction Review Code 1- Verbalizes Understanding       Pulmonary Medications Verbally interactive group education provided by instructor with focus on  inhaled medications and proper administration. Flowsheet Row PULMONARY REHAB CHRONIC OBSTRUCTIVE PULMONARY DISEASE from 07/17/2021 in [facility] for Heart, Vascular, & Lung Health  Date 06/19/21  Educator Handout       Anatomy and Physiology of the Respiratory System Group instruction provided by PowerPoint, verbal discussion, and written material to support subject matter. Instructor reviews respiratory cycle and anatomical components of the respiratory system and their functions. Instructor also reviews differences in obstructive and restrictive respiratory diseases with examples of each.  Flowsheet Row PULMONARY REHAB CHRONIC OBSTRUCTIVE PULMONARY DISEASE from 04/20/2024 in [facility] for Heart, Vascular, & Lung Health  Date 04/20/24  Educator RT  Instruction Review Code 1- Verbalizes Understanding       Oxygen  Safety Group instruction provided by PowerPoint, verbal discussion, and written material to support subject matter. There is an overview of "What is Oxygen " and "Why do we need it".  Instructor also reviews how to create a safe environment for oxygen  use, the importance of using oxygen  as prescribed, and the risks of noncompliance. There is a brief discussion on traveling with oxygen  and resources the patient may utilize. Flowsheet Row PULMONARY REHAB CHRONIC OBSTRUCTIVE PULMONARY DISEASE from 02/24/2024 in [facility] for Heart, Vascular, & Lung Health  Date 02/24/24  Educator RN  Instruction Review Code 1- Verbalizes Understanding       Oxygen  Use Group instruction provided by PowerPoint, verbal discussion, and written material to discuss how supplemental oxygen  is prescribed and different types of oxygen  supply systems. Resources for more information are provided.  Flowsheet Row PULMONARY REHAB CHRONIC OBSTRUCTIVE PULMONARY DISEASE from 03/02/2024 in [facility] for Heart, Vascular,  & Lung Health  Date 03/02/24  Educator RT  Instruction Review Code 1- Verbalizes Understanding       Breathing Techniques Group instruction that is supported by demonstration and informational handouts. Instructor discusses the benefits of pursed lip and diaphragmatic breathing and detailed demonstration on how to perform both.  Flowsheet Row PULMONARY REHAB CHRONIC OBSTRUCTIVE PULMONARY DISEASE from 03/09/2024 in [facility] for Heart, Vascular, & Lung Health  Date 03/09/24  Educator RN  Instruction Review Code 1- Verbalizes Understanding        Risk Factor Reduction Group instruction that is supported by a PowerPoint presentation. Instructor discusses the definition of a risk factor, different risk factors for pulmonary disease, and how the heart and lungs work together. Flowsheet Row PULMONARY REHAB CHRONIC OBSTRUCTIVE PULMONARY DISEASE from 03/30/2024 in [facility] for Heart, Vascular, & Lung Health  Date 03/30/24  Educator EP  Instruction Review Code 1- Verbalizes Understanding       Pulmonary Diseases Group instruction provided by PowerPoint, verbal discussion, and written material to support subject matter. Instructor gives an overview of the different type of pulmonary diseases. There is also a discussion on risk factors and symptoms as well as ways to manage the diseases. Flowsheet Row PULMONARY REHAB CHRONIC OBSTRUCTIVE PULMONARY DISEASE from 04/13/2024 in [facility] for Heart, Vascular, & Lung Health  Date 04/13/24  Educator RT  Instruction Review Code 1- Verbalizes Understanding       Stress and Energy Conservation Group instruction provided by PowerPoint, verbal discussion, and written material to support subject matter. Instructor gives an overview of stress and the impact it can have on the body. Instructor also reviews ways to reduce stress. There is also a discussion on energy conservation  and ways to conserve energy throughout the day. Flowsheet Row PULMONARY REHAB CHRONIC OBSTRUCTIVE PULMONARY DISEASE from 03/16/2024 in [facility] for Heart, Vascular, & Lung Health  Date 03/16/24  Educator RN  Instruction Review Code 1- Verbalizes Understanding       Warning Signs and Symptoms Group instruction provided by PowerPoint, verbal discussion, and written material to support subject matter. Instructor reviews warning signs and symptoms of stroke, heart attack, cold and flu. Instructor also reviews ways to prevent the spread of infection. Flowsheet Row PULMONARY REHAB CHRONIC OBSTRUCTIVE PULMONARY DISEASE from 03/23/2024 in [facility] for Heart, Vascular, & Lung Health  Date 03/23/24  Educator RN  Instruction Review Code 1- Verbalizes Understanding       Other Education Group or individual verbal, written, or video instructions that support the educational goals of the pulmonary rehab program. Flowsheet Row PULMONARY REHAB CHRONIC OBSTRUCTIVE PULMONARY DISEASE from 07/17/2021 in [facility] for Heart, Vascular, & Lung Health  Date 07/17/21  Educator Handout  [How to beat a sedentary lifestyle]        Knowledge Questionnaire Score:  Knowledge Questionnaire Score - 02/18/24 1000       Knowledge Questionnaire Score   Pre Score 14/18             Core Components/Risk Factors/Patient Goals at Admission:  Personal Goals and Risk Factors at Admission - 02/18/24 0937       Core Components/Risk Factors/Patient Goals on Admission    Weight Management Yes;Weight Gain    Intervention Weight Management: Develop a combined nutrition and exercise program designed to reach desired caloric intake, while maintaining appropriate intake of nutrient and fiber, sodium and fats, and appropriate energy expenditure required for the weight goal.;Weight Management: Provide education and appropriate resources to help  participant work on and attain dietary goals.;Weight Management/Obesity: Establish reasonable short term and long term weight goals.;Obesity: Provide education and appropriate resources to help participant work on and attain dietary goals.    Admit Weight 163 lb 12.8 oz (74.3 kg)    Expected Outcomes Short Term: Continue to assess and modify interventions until short term weight is achieved;Long Term: Adherence to nutrition and physical activity/exercise program aimed toward attainment of established weight goal;Weight Gain: Understanding of general recommendations for a high calorie, high protein meal plan that promotes weight gain by distributing calorie intake throughout the day with the consumption for 4-5 meals, snacks, and/or supplements;Understanding recommendations for meals to include 15-35% energy as protein, 25-35% energy from fat, 35-60% energy  from carbohydrates, less than 200mg  of dietary cholesterol, 20-35 gm of total fiber daily;Understanding of distribution of calorie intake throughout the day with the consumption of 4-5 meals/snacks    Improve shortness of breath with ADL's Yes    Intervention Provide education, individualized exercise plan and daily activity instruction to help decrease symptoms of SOB with activities of daily living.    Expected Outcomes Short Term: Improve cardiorespiratory fitness to achieve a reduction of symptoms when performing ADLs;Long Term: Be able to perform more ADLs without symptoms or delay the onset of symptoms             Core Components/Risk Factors/Patient Goals Review:   Goals and Risk Factor Review     Row Name 02/21/24 4401 03/22/24 1111 04/19/24 1126         Core Components/Risk Factors/Patient Goals Review   Personal Goals Review Weight Management/Obesity;Improve shortness of breath with ADL's;Develop more efficient breathing techniques such as purse lipped breathing and diaphragmatic breathing and practicing self-pacing with activity.  Weight Management/Obesity;Improve shortness of breath with ADL's;Develop more efficient breathing techniques such as purse lipped breathing and diaphragmatic breathing and practicing self-pacing with activity. Weight Management/Obesity;Improve shortness of breath with ADL's     Review Trayvion is scheduled to start PR on 02/22/24. Unable to assess his goals at this time. We will continue to monitor his progress throughout the program. Monthly review of patient's Core Components/Risk Factors/Patient Goals are as follows: Goal progressing for improving shortness of breath with ADL's. Adonai is currently exercising on RA  to maintain sats >88%. He is currently exercising on the treadmill and the recumbant elliptical. Goal met for developing more efficient breathing techniques such as purse lipped breathing and diaphragmatic breathing; and practicing self-pacing with activity. Shawne has attended the breathing techniques education and has been practicing diaphragmatic breathing at home. He is able to demonstrate purse lip breathing when he gets SOB as well as self pace based on the RPE/dyspnea scale. Goal progressing for weight gain . Lajuane is working with staff dietitian to achieve his weight gain goals. Monthly review of patient's Core Components/Risk Factors/Patient Goals are as follows: Goal progressing for improving shortness of breath with ADL's. Antinio is currently exercising on RA  to maintain sats >88%. He is currently exercising on the treadmill and the recumbant elliptical. Goal progressing for weight gain. We will continue to monitor his progress throughout the program.     Expected Outcomes To improve shortness of breath with ADL's, develop more efficient breathing techniques such as purse lipped breathing and diaphragmatic breathing; and practicing self-pacing with activity and gain weight. To improve shortness of breath with ADL's  and gain weight. To improve shortness of breath with ADL's  and gain  weight.              Core Components/Risk Factors/Patient Goals at Discharge (Final Review):   Goals and Risk Factor Review - 04/19/24 1126       Core Components/Risk Factors/Patient Goals Review   Personal Goals Review Weight Management/Obesity;Improve shortness of breath with ADL's    Review Monthly review of patient's Core Components/Risk Factors/Patient Goals are as follows: Goal progressing for improving shortness of breath with ADL's. Itay is currently exercising on RA  to maintain sats >88%. He is currently exercising on the treadmill and the recumbant elliptical. Goal progressing for weight gain. We will continue to monitor his progress throughout the program.    Expected Outcomes To improve shortness of breath with ADL's  and gain weight.  ITP Comments:Pt is making expected progress toward Pulmonary Rehab goals after completing 18 session(s). Recommend continued exercise, life style modification, education, and utilization of breathing techniques to increase stamina and strength, while also decreasing shortness of breath with exertion.  Dr. Genetta Kenning is Medical Director for Pulmonary Rehab at [facility].

## 2024-04-27 ENCOUNTER — Encounter (HOSPITAL_COMMUNITY)
Admission: RE | Admit: 2024-04-27 | Discharge: 2024-04-27 | Disposition: A | Discharge status: Home / Self Care | Source: Ambulatory Visit | Attending: Pulmonary Disease | Admitting: Pulmonary Disease

## 2024-04-27 DIAGNOSIS — J449 Chronic obstructive pulmonary disease, unspecified: Secondary | ICD-10-CM | POA: Diagnosis not present

## 2024-04-27 NOTE — Progress Notes (Signed)
 Daily Session Note  Patient Details  Name: Ryan Diaz MRN: [id number] Date of Birth: 07-Sep-1946 Referring Provider:   [name] Pulmonary Rehab Walk Test from 02/18/2024 in [facility] for [facility], Vascular, & [facility]  Referring Provider [name]    Encounter Date: 04/27/2024  Check In:  Session Check In - 04/27/24 1034       Check-In   Supervising physician immediately available to respond to emergencies [facility] MD immediately available    Physician(s) Slater Duncan, NP    Location [facility]    Staff Present Sueellen Emery BS, ACSM-CEP, Exercise Physiologist;Maize Brittingham Nolon Baxter, MS, ACSM-CEP, Exercise Physiologist;Samantha Belarus, RD, LDN;Casey Carmen Chol, RN, BSN    Virtual Visit No    Medication changes reported     No    Fall or balance concerns reported    No    Tobacco Cessation No Change    Warm-up and Cool-down Performed as group-led Writer Performed Yes    VAD Patient? No    PAD/SET Patient? No      Pain Assessment   Currently in Pain? No/denies          Capillary Blood Glucose: No results found for this or any previous visit (from the past 24 hours).    Social History   Tobacco Use  Smoking Status Former   Current packs/day: 0.00   Average packs/day: 1.5 packs/day for 35.0 years (52.5 ttl pk-yrs)   Types: Cigarettes   Start date: 09/04/1974   Quit date: 09/04/2009   Years since quitting: 14.6   Passive exposure: Past  Smokeless Tobacco Never    Goals Met:  Proper associated with RPD/PD & O2 Sat Exercise tolerated well No report of concerns or symptoms today Strength training completed today  Goals Unmet:  Not Applicable  Comments: Service time is from 1005 to 1135.  Dr. Genetta Kenning is Medical Director for Pulmonary Rehab at [facility].

## 2024-05-02 ENCOUNTER — Encounter (HOSPITAL_COMMUNITY)
Admission: RE | Admit: 2024-05-02 | Discharge: 2024-05-02 | Disposition: A | Discharge status: Home / Self Care | Source: Ambulatory Visit | Attending: Pulmonary Disease | Admitting: Pulmonary Disease

## 2024-05-02 DIAGNOSIS — J449 Chronic obstructive pulmonary disease, unspecified: Secondary | ICD-10-CM

## 2024-05-02 NOTE — Progress Notes (Signed)
 Daily Session Note  Patient Details  Name: Ryan Diaz MRN: [id number] Date of Birth: 1946/02/20 Referring Provider:   [name] Pulmonary Rehab Walk Test from 02/18/2024 in [facility] for [facility], Vascular, & Lung Health  Referring Provider [name]    Encounter Date: 05/02/2024  Check In:  Session Check In - 05/02/24 1127       Check-In   Supervising physician immediately available to respond to emergencies [facility] MD immediately available    Physician(s) Levin Reamer, NP    [facility]    Staff Present Sueellen Emery BS, ACSM-CEP, Exercise Physiologist;Kaylee Nolon Baxter, MS, ACSM-CEP, Exercise Physiologist;Samantha Belarus, RD, LDN;Casey Carmen Chol, RN, BSN    Virtual Visit No    Medication changes reported     No    Fall or balance concerns reported    No    Tobacco Cessation No Change    Warm-up and Cool-down Performed as group-led Writer Performed Yes    VAD Patient? No    PAD/SET Patient? No      Pain Assessment   Currently in Pain? No/denies    Multiple Pain Sites No          Capillary Blood Glucose: No results found for this or any previous visit (from the past 24 hours).    Social History   Tobacco Use  Smoking Status Former   Current packs/day: 0.00   Average packs/day: 1.5 packs/day for 35.0 years (52.5 ttl pk-yrs)   Types: Cigarettes   Start date: 09/04/1974   Quit date: 09/04/2009   Years since quitting: 14.6   Passive exposure: Past  Smokeless Tobacco Never    Goals Met:  Independence with exercise equipment Exercise tolerated well No report of concerns or symptoms today Strength training completed today  Goals Unmet:  Not Applicable  Comments: Service time is from 1010 to 1133.    Dr. Genetta Kenning is Medical Director for Pulmonary Rehab at [facility].

## 2024-05-04 ENCOUNTER — Encounter (HOSPITAL_COMMUNITY)
Admission: RE | Admit: 2024-05-04 | Discharge: 2024-05-04 | Disposition: A | Discharge status: Home / Self Care | Source: Ambulatory Visit | Attending: Pulmonary Disease | Admitting: Pulmonary Disease

## 2024-05-04 DIAGNOSIS — J449 Chronic obstructive pulmonary disease, unspecified: Secondary | ICD-10-CM | POA: Diagnosis not present

## 2024-05-09 ENCOUNTER — Encounter (HOSPITAL_COMMUNITY)
Admission: RE | Admit: 2024-05-09 | Discharge: 2024-05-09 | Disposition: A | Discharge status: Home / Self Care | Source: Ambulatory Visit | Attending: Pulmonary Disease

## 2024-05-09 VITALS — Wt 160.5 lb

## 2024-05-09 DIAGNOSIS — J449 Chronic obstructive pulmonary disease, unspecified: Secondary | ICD-10-CM | POA: Diagnosis not present

## 2024-05-09 NOTE — Progress Notes (Signed)
 Daily Session Note  Patient Details  Name: GROVE DEFINA MRN: [id number] Date of Birth: 1946/03/01 Referring Provider:   [name] Pulmonary Rehab Walk Test from 02/18/2024 in [facility] for Heart, Vascular, & Lung Health  Referring Provider [name]    Encounter Date: 05/09/2024  Check In:  Session Check In - 05/09/24 1037       Check-In   Supervising physician immediately available to respond to emergencies [facility] MD immediately available    Physician(s) Josefa Beauvais, NP    [facility]    Staff Present Cloyd Aris BS, ACSM-CEP, Exercise Physiologist;Kaylee Nicholaus, MS, ACSM-CEP, Exercise Physiologist;Alesi Zachery Claudene Candia Levin, RN, BSN    Virtual Visit No    Medication changes reported     No    Fall or balance concerns reported    No    Tobacco Cessation No Change    Warm-up and Cool-down Performed as group-led instruction    Resistance Training Performed Yes    VAD Patient? No    PAD/SET Patient? No      Pain Assessment   Currently in Pain? No/denies    Multiple Pain Sites No          Capillary Blood Glucose: No results found for this or any previous visit (from the past 24 hours).   Exercise Prescription Changes - 05/09/24 1200       Response to Exercise   Blood Pressure (Admit) 112/70    Blood Pressure (Exercise) 128/66    Blood Pressure (Exit) 104/61    Heart Rate (Admit) 71 bpm    Heart Rate (Exercise) 106 bpm    Heart Rate (Exit) 90 bpm    Oxygen  Saturation (Admit) 98 %    Oxygen  Saturation (Exercise) 93 %    Oxygen  Saturation (Exit) 95 %    Rating of Perceived Exertion (Exercise) 9    Perceived Dyspnea (Exercise) 1    Duration Continue with 30 min of aerobic exercise without signs/symptoms of physical distress.    Intensity THRR unchanged      Progression   Progression Continue to progress workloads to maintain intensity without signs/symptoms of physical distress.      Resistance Training    Training Prescription Yes    Weight blue bands    Reps 10-15    Time 10 Minutes      Treadmill   MPH 3    Grade 3    Minutes 15    METs 4.3      Recumbant Elliptical   Level 5    RPM 67    Watts 110    Minutes 15    METs 5.3          Social History   Tobacco Use  Smoking Status Former   Current packs/day: 0.00   Average packs/day: 1.5 packs/day for 35.0 years (52.5 ttl pk-yrs)   Types: Cigarettes   Start date: 09/04/1974   Quit date: 09/04/2009   Years since quitting: 14.6   Passive exposure: Past  Smokeless Tobacco Never    Goals Met:  Proper associated with RPD/PD & O2 Sat Independence with exercise equipment Exercise tolerated well No report of concerns or symptoms today Strength training completed today  Goals Unmet:  Not Applicable  Comments: Service time is from 1009 to 1155.    Dr. Slater Staff is Medical Director for Pulmonary Rehab at [facility].

## 2024-05-11 ENCOUNTER — Encounter (HOSPITAL_COMMUNITY)
Admission: RE | Admit: 2024-05-11 | Discharge: 2024-05-11 | Disposition: A | Discharge status: Home / Self Care | Source: Ambulatory Visit | Attending: Pulmonary Disease | Admitting: Pulmonary Disease

## 2024-05-11 DIAGNOSIS — J449 Chronic obstructive pulmonary disease, unspecified: Secondary | ICD-10-CM

## 2024-05-11 NOTE — Progress Notes (Signed)
 Daily Session Note  Patient Details  Name: Ryan Diaz MRN: [id number] Date of Birth: 02/21/1946 Referring Provider:   [name] Pulmonary Rehab Walk Test from 02/18/2024 in [facility] for Heart, Vascular, & Lung Health  Referring Provider [name]    Encounter Date: 05/11/2024  Check In:  Session Check In - 05/11/24 1034       Check-In   Supervising physician immediately available to respond to emergencies [facility] MD immediately available    Physician(s) Carolyn Mose, NP    [facility]    Staff Present Cloyd Aris BS, ACSM-CEP, Exercise Physiologist;Jumana Paccione Nicholaus, MS, ACSM-CEP, Exercise Physiologist;Casey Claudene Candia Levin, RN, BSN    Virtual Visit No    Medication changes reported     No    Fall or balance concerns reported    No    Tobacco Cessation No Change    Warm-up and Cool-down Performed as group-led instruction    Resistance Training Performed Yes    VAD Patient? No    PAD/SET Patient? No      Pain Assessment   Currently in Pain? No/denies    Multiple Pain Sites No          Capillary Blood Glucose: No results found for this or any previous visit (from the past 24 hours).    Social History   Tobacco Use  Smoking Status Former   Current packs/day: 0.00   Average packs/day: 1.5 packs/day for 35.0 years (52.5 ttl pk-yrs)   Types: Cigarettes   Start date: 09/04/1974   Quit date: 09/04/2009   Years since quitting: 14.6   Passive exposure: Past  Smokeless Tobacco Never    Goals Met:  Proper associated with RPD/PD & O2 Sat Exercise tolerated well No report of concerns or symptoms today Strength training completed today  Goals Unmet:  Not Applicable  Comments: Service time is from 1009 to 1142.  Dr. Slater Staff is Medical Director for Pulmonary Rehab at [facility].

## 2024-05-18 ENCOUNTER — Encounter (HOSPITAL_COMMUNITY)
Admission: RE | Admit: 2024-05-18 | Discharge: 2024-05-18 | Disposition: A | Discharge status: Home / Self Care | Source: Ambulatory Visit | Attending: Pulmonary Disease | Admitting: Pulmonary Disease

## 2024-05-18 VITALS — Wt 160.5 lb

## 2024-05-18 DIAGNOSIS — G4700 Insomnia, unspecified: Secondary | ICD-10-CM | POA: Diagnosis not present

## 2024-05-18 DIAGNOSIS — J449 Chronic obstructive pulmonary disease, unspecified: Secondary | ICD-10-CM | POA: Insufficient documentation

## 2024-05-18 DIAGNOSIS — G4733 Obstructive sleep apnea (adult) (pediatric): Secondary | ICD-10-CM | POA: Diagnosis not present

## 2024-05-18 NOTE — Progress Notes (Signed)
 Daily Session Note  Patient Details  Name: MAHIN GUARDIA MRN: [id number] Date of Birth: 02/07/1946 Referring Provider:   [name] Pulmonary Rehab Walk Test from 02/18/2024 in [facility] for Heart, Vascular, & Lung Health  Referring Provider [name]    Encounter Date: 05/18/2024  Check In:  Session Check In - 05/18/24 1041       Check-In   Supervising physician immediately available to respond to emergencies [facility] MD immediately available    Physician(s) Lum Louis, NP    [facility]    Staff Present Cloyd Aris BS, ACSM-CEP, Exercise Physiologist;Rueben Kassim Nicholaus, MS, ACSM-CEP, Exercise Physiologist;Mary Harvy, RN, BSN;Other;Samantha Belarus, RD, LDN    Virtual Visit No    Medication changes reported     No    Fall or balance concerns reported    No    Tobacco Cessation No Change    Warm-up and Cool-down Performed as group-led instruction    Resistance Training Performed Yes    VAD Patient? No    PAD/SET Patient? No      Pain Assessment   Currently in Pain? No/denies          Capillary Blood Glucose: No results found for this or any previous visit (from the past 24 hours).    Social History   Tobacco Use  Smoking Status Former   Current packs/day: 0.00   Average packs/day: 1.5 packs/day for 35.0 years (52.5 ttl pk-yrs)   Types: Cigarettes   Start date: 09/04/1974   Quit date: 09/04/2009   Years since quitting: 14.7   Passive exposure: Past  Smokeless Tobacco Never    Goals Met:  Proper associated with RPD/PD & O2 Sat Exercise tolerated well No report of concerns or symptoms today Strength training completed today  Goals Unmet:  Not Applicable  Comments: Service time is from 1007 to 1142.    Dr. Slater Staff is Medical Director for Pulmonary Rehab at [facility]

## 2024-05-23 ENCOUNTER — Encounter (HOSPITAL_COMMUNITY)

## 2024-05-24 NOTE — Progress Notes (Signed)
 Pulmonary Individual Treatment Plan  Patient Details  Name: Ryan Diaz MRN: [id number] Date of Birth: Oct 26, 1946 Referring Provider:   [name] Pulmonary Rehab Walk Test from 02/18/2024 in [facility]  Referring Provider [name]    Initial Encounter Date:  Flowsheet Row Pulmonary Rehab Walk Test from 02/18/2024 in [facility]  Date 02/18/24    Visit Diagnosis: Chronic obstructive pulmonary disease, unspecified COPD type (HCC)  Patient's Home Medications on Admission:   Current Outpatient Medications:    albuterol  (ACCUNEB ) 0.63 MG/3ML nebulizer solution, Take 3 mLs (0.63 mg total) by nebulization every 6 (six) hours as needed for wheezing., Disp: 75 mL, Rfl: 12   albuterol  (VENTOLIN  HFA) 108 (90 Base) MCG/ACT inhaler, Inhale 2 puffs into the lungs every 6 (six) hours as needed for wheezing or shortness of breath., Disp: 18 g, Rfl: 12   aspirin  EC 81 MG tablet, Take 81 mg by mouth daily., Disp: , Rfl:    atorvastatin  (LIPITOR) 80 MG tablet, Take 1 tablet by mouth once daily, Disp: 90 tablet, Rfl: 2   cetirizine  (ZYRTEC ) 10 MG tablet, Take by mouth., Disp: , Rfl:    Cholecalciferol (VITAMIN D ) 50 MCG (2000 UT) tablet, Take 1 tablet (2,000 Units total) by mouth daily., Disp: 90 tablet, Rfl: 3   Cholecalciferol 50 MCG (2000 UT) TABS, Take 1 tablet by mouth daily., Disp: , Rfl:    ciclesonide (ALVESCO) 160 MCG/ACT inhaler, Inhale into the lungs., Disp: , Rfl:    clotrimazole  (LOTRIMIN ) 1 % cream, Apply 1 application topically 2 (two) times daily as needed (irritation)., Disp: , Rfl:    clotrimazole -betamethasone  (LOTRISONE ) cream, Apply 1 Application topically daily. Use as directed twice per day as needed, Disp: 30 g, Rfl: 0   cromolyn (OPTICROM) 4 % ophthalmic solution, Place 1 drop into both eyes 4 (four) times daily as needed., Disp: , Rfl:    cyanocobalamin   (VITAMIN B12) 1000 MCG tablet, Take 1 tablet (1,000 mcg total) by mouth daily., Disp: 90 tablet, Rfl: 3   docusate sodium (COLACE) 100 MG capsule, Take 100 mg by mouth daily as needed for mild constipation., Disp: , Rfl:    ferrous sulfate 324 MG TBEC, Take 324 mg by mouth. As needed, Disp: , Rfl:    fluticasone  (FLONASE ) 50 MCG/ACT nasal spray, Place into the nose., Disp: , Rfl:    Fluticasone -Umeclidin-Vilant (TRELEGY ELLIPTA ) 100-62.5-25 MCG/ACT AEPB, Inhale 1 puff then rinse mouth, once daily, Disp: 60 each, Rfl: 12   gabapentin  (NEURONTIN ) 100 MG capsule, Take 1 capsule (100 mg total) by mouth 3 (three) times daily., Disp: 90 capsule, Rfl: 5   hydrocortisone 2.5 % cream, SMARTSIG:Topical 1-2 Times Daily PRN, Disp: , Rfl:    ipratropium (ATROVENT ) 0.06 % nasal spray, Place 2 sprays into both nostrils 3 (three) times daily., Disp: 15 mL, Rfl: 5   levocetirizine (XYZAL  ALLERGY 24HR) 5 MG tablet, Take 1 tablet (5 mg total) by mouth every evening., Disp: 30 tablet, Rfl: 5   losartan  (COZAAR ) 100 MG tablet, Take 1 tablet (100 mg total) by mouth daily., Disp: 90 tablet, Rfl: 3   meloxicam  (MOBIC ) 15 MG tablet, 1 tab by mouth once daily as needed, Disp: 90 tablet, Rfl: 1   mirtazapine (REMERON) 7.5 MG tablet, Take by mouth., Disp: , Rfl:    nitroGLYCERIN  (NITROSTAT ) 0.4 MG SL tablet, Place 1 tablet (0.4 mg total) under the tongue  every 5 (five) minutes as needed for chest pain., Disp: 25 tablet, Rfl: 4   Omega-3 Fatty Acids (FISH OIL) 1000 MG CAPS, Take 1,000 mg by mouth 2 (two) times daily., Disp: , Rfl:    pantoprazole (PROTONIX) 40 MG tablet, TAKE ONE TABLET BY MOUTH ONCE EVERY DAY TO CONTROL STOMACH ACID, Disp: , Rfl:    pantoprazole (PROTONIX) 40 MG tablet, Take by mouth., Disp: , Rfl:    Spacer/Aero-Holding Chambers (AEROCHAMBER MV) inhaler, Use as instructed, Disp: 1 each, Rfl: 0   tadalafil (CIALIS) 20 MG tablet, Take 20 mg by mouth daily as needed for erectile dysfunction., Disp: , Rfl:     terazosin  (HYTRIN ) 2 MG capsule, Take 1 capsule (2 mg total) by mouth at bedtime., Disp: 90 capsule, Rfl: 3   Tiotropium Bromide-Olodaterol 2.5-2.5 MCG/ACT AERS, Inhale into the lungs., Disp: , Rfl:    traZODone (DESYREL) 50 MG tablet, Take 50 mg by mouth at bedtime as needed for sleep., Disp: , Rfl:    triamcinolone  (NASACORT ) 55 MCG/ACT AERO nasal inhaler, Place 2 sprays into the nose daily., Disp: 1 each, Rfl: 12  Past Medical History: Past Medical History:  Diagnosis Date   ABUSE, ALCOHOL, CONTINUOUS 08/08/2007   ALLERGIC RHINITIS 08/08/2007   ANXIETY 08/08/2007   Carpal tunnel syndrome    COPD 10/03/2009   DEPRESSION 08/08/2007   ERECTILE DYSFUNCTION 08/08/2007   GENITAL HERPES, HX OF 08/08/2007   GERD 08/08/2007   GLUCOSE INTOLERANCE 08/08/2007   HYPERLIPIDEMIA 08/08/2007   HYPERTENSION 08/08/2007   Impaired glucose tolerance 09/26/2011   Insomnia 05/18/2012   PAIN IN SOFT TISSUES OF LIMB 10/02/2009   Sleep apnea    OSA-uses CPAP nightly    Tobacco Use: Social History   Tobacco Use  Smoking Status Former   Current packs/day: 0.00   Average packs/day: 1.5 packs/day for 35.0 years (52.5 ttl pk-yrs)   Types: Cigarettes   Start date: 09/04/1974   Quit date: 09/04/2009   Years since quitting: 14.7   Passive exposure: Past  Smokeless Tobacco Never    Labs: Review Flowsheet  More data exists      Latest Ref Rng & Units 06/23/2022 07/31/2022 11/24/2022 06/18/2023 11/16/2023  Labs for ITP Cardiac and Pulmonary Rehab  Cholestrol 0 - 200 mg/dL 883  856  875  860  890   LDL (calc) 0 - 99 mg/dL 69  65  44  71  59   HDL-C >39.00 mg/dL 71.19  56.39  58.29  60.29  34.00   Trlycerides 0.0 - 149.0 mg/dL 06.9  828.9  807.9  858.9  81.0   Hemoglobin A1c 4.6 - 6.5 % 6.0  5.8  5.9  5.8  6.0     Capillary Blood Glucose: No results found for: GLUCAP   Pulmonary Assessment Scores:  Pulmonary Assessment Scores     Row Name 02/18/24 1001         ADL UCSD   ADL Phase Entry     SOB Score  total 34       CAT Score   CAT Score 14       mMRC Score   mMRC Score 1       UCSD: Self-administered rating of dyspnea associated with activities of daily living (ADLs) 6-point scale (0 = not at all to 5 = maximal or unable to do because of breathlessness)  Scoring Scores range from 0 to 120.  Minimally important difference is 5 units  CAT: CAT can identify the health impairment  of COPD patients and is better correlated with disease progression.  CAT has a scoring range of zero to 40. The CAT score is classified into four groups of low (less than 10), medium (10 - 20), high (21-30) and very high (31-40) based on the impact level of disease on health status. A CAT score over 10 suggests significant symptoms.  A worsening CAT score could be explained by an exacerbation, poor medication adherence, poor inhaler technique, or progression of COPD or comorbid conditions.  CAT MCID is 2 points  mMRC: mMRC (Modified Medical Research Council) Dyspnea Scale is used to assess the degree of baseline functional disability in patients of respiratory disease due to dyspnea. No minimal important difference is established. A decrease in score of 1 point or greater is considered a positive change.   Pulmonary Function Assessment:  Pulmonary Function Assessment - 02/18/24 0943       Breath   Bilateral Breath Sounds Clear;Decreased    Shortness of Breath Yes;Fear of Shortness of Breath;Limiting activity;Panic with Shortness of Breath          Exercise Target Goals: Exercise Program Goal: Individual exercise prescription set using results from initial 6 min walk test and THRR while considering  patient's activity barriers and safety.   Exercise Prescription Goal: Initial exercise prescription builds to 30-45 minutes a day of aerobic activity, 2-3 days per week.  Home exercise guidelines will be given to patient during program as part of exercise prescription that the participant will  acknowledge.  Activity Barriers & Risk Stratification:  Activity Barriers & Cardiac Risk Stratification - 02/18/24 0941       Activity Barriers & Cardiac Risk Stratification   Activity Barriers Deconditioning;Muscular Weakness;Shortness of Breath;Arthritis;Balance Concerns    Cardiac Risk Stratification Moderate          6 Minute Walk:  6 Minute Walk     Row Name 02/18/24 1027         6 Minute Walk   Phase Initial     Distance 1295 feet     Walk Time 6 minutes     # of Rest Breaks 0     MPH 2.45     METS 2.66     RPE 11     Perceived Dyspnea  1     VO2 Peak 9.32     Symptoms No     Resting HR 89 bpm     Resting BP 111/65     Resting Oxygen  Saturation  98 %     Exercise Oxygen  Saturation  during 6 min walk 95 %     Max Ex. HR 106 bpm     Max Ex. BP 128/70     2 Minute Post BP 102/60       Interval HR   1 Minute HR 97     2 Minute HR 105     3 Minute HR 101     4 Minute HR 101     5 Minute HR 106     6 Minute HR 106     2 Minute Post HR 83     Interval Heart Rate? Yes       Interval Oxygen    Interval Oxygen ? Yes     Baseline Oxygen  Saturation % 98 %     1 Minute Oxygen  Saturation % 97 %     1 Minute Liters of Oxygen  0 L     2 Minute Oxygen  Saturation % 95 %  2 Minute Liters of Oxygen  0 L     3 Minute Oxygen  Saturation % 97 %     3 Minute Liters of Oxygen  0 L     4 Minute Oxygen  Saturation % 95 %     4 Minute Liters of Oxygen  0 L     5 Minute Oxygen  Saturation % 966 %     5 Minute Liters of Oxygen  0 L     6 Minute Oxygen  Saturation % 96 %     6 Minute Liters of Oxygen  0 L     2 Minute Post Oxygen  Saturation % 98 %     2 Minute Post Liters of Oxygen  0 L        Oxygen  Initial Assessment:  Oxygen  Initial Assessment - 02/18/24 0943       Home Oxygen    Home Oxygen  Device None    Sleep Oxygen  Prescription CPAP    Home Exercise Oxygen  Prescription None    Home Resting Oxygen  Prescription None      Initial 6 min Walk   Oxygen  Used None       Program Oxygen  Prescription   Program Oxygen  Prescription None      Intervention   Short Term Goals To learn and understand importance of monitoring SPO2 with pulse oximeter and demonstrate accurate use of the pulse oximeter.;To learn and understand importance of maintaining oxygen  saturations>88%;To learn and demonstrate proper pursed lip breathing techniques or other breathing techniques. ;To learn and demonstrate proper use of respiratory medications    Long  Term Goals Maintenance of O2 saturations>88%;Compliance with respiratory medication;Verbalizes importance of monitoring SPO2 with pulse oximeter and return demonstration;Exhibits proper breathing techniques, such as pursed lip breathing or other method taught during program session;Demonstrates proper use of MDI's          Oxygen  Re-Evaluation:  Oxygen  Re-Evaluation     Row Name 02/23/24 0849 03/24/24 0939 04/21/24 0823 05/17/24 0957       Program Oxygen  Prescription   Program Oxygen  Prescription None None None None      Home Oxygen    Home Oxygen  Device None None None None    Sleep Oxygen  Prescription CPAP CPAP CPAP CPAP    Home Exercise Oxygen  Prescription None None None None    Home Resting Oxygen  Prescription None None None None      Goals/Expected Outcomes   Short Term Goals To learn and understand importance of monitoring SPO2 with pulse oximeter and demonstrate accurate use of the pulse oximeter.;To learn and understand importance of maintaining oxygen  saturations>88%;To learn and demonstrate proper pursed lip breathing techniques or other breathing techniques. ;To learn and demonstrate proper use of respiratory medications To learn and understand importance of monitoring SPO2 with pulse oximeter and demonstrate accurate use of the pulse oximeter.;To learn and understand importance of maintaining oxygen  saturations>88%;To learn and demonstrate proper pursed lip breathing techniques or other breathing techniques. ;To learn and  demonstrate proper use of respiratory medications To learn and understand importance of monitoring SPO2 with pulse oximeter and demonstrate accurate use of the pulse oximeter.;To learn and understand importance of maintaining oxygen  saturations>88%;To learn and demonstrate proper pursed lip breathing techniques or other breathing techniques. ;To learn and demonstrate proper use of respiratory medications To learn and understand importance of monitoring SPO2 with pulse oximeter and demonstrate accurate use of the pulse oximeter.;To learn and understand importance of maintaining oxygen  saturations>88%;To learn and demonstrate proper pursed lip breathing techniques or other breathing techniques. ;To learn and demonstrate proper use  of respiratory medications    Long  Term Goals Maintenance of O2 saturations>88%;Compliance with respiratory medication;Verbalizes importance of monitoring SPO2 with pulse oximeter and return demonstration;Exhibits proper breathing techniques, such as pursed lip breathing or other method taught during program session;Demonstrates proper use of MDI's Maintenance of O2 saturations>88%;Compliance with respiratory medication;Verbalizes importance of monitoring SPO2 with pulse oximeter and return demonstration;Exhibits proper breathing techniques, such as pursed lip breathing or other method taught during program session;Demonstrates proper use of MDI's Maintenance of O2 saturations>88%;Compliance with respiratory medication;Verbalizes importance of monitoring SPO2 with pulse oximeter and return demonstration;Exhibits proper breathing techniques, such as pursed lip breathing or other method taught during program session;Demonstrates proper use of MDI's Maintenance of O2 saturations>88%;Compliance with respiratory medication;Verbalizes importance of monitoring SPO2 with pulse oximeter and return demonstration;Exhibits proper breathing techniques, such as pursed lip breathing or other method  taught during program session;Demonstrates proper use of MDI's    Goals/Expected Outcomes Compliance and understanding of oxygen  saturation monitoring and breathing techniques to decrease shortness of breath. Compliance and understanding of oxygen  saturation monitoring and breathing techniques to decrease shortness of breath. Compliance and understanding of oxygen  saturation monitoring and breathing techniques to decrease shortness of breath. Compliance and understanding of oxygen  saturation monitoring and breathing techniques to decrease shortness of breath.       Oxygen  Discharge (Final Oxygen  Re-Evaluation):  Oxygen  Re-Evaluation - 05/17/24 0957       Program Oxygen  Prescription   Program Oxygen  Prescription None      Home Oxygen    Home Oxygen  Device None    Sleep Oxygen  Prescription CPAP    Home Exercise Oxygen  Prescription None    Home Resting Oxygen  Prescription None      Goals/Expected Outcomes   Short Term Goals To learn and understand importance of monitoring SPO2 with pulse oximeter and demonstrate accurate use of the pulse oximeter.;To learn and understand importance of maintaining oxygen  saturations>88%;To learn and demonstrate proper pursed lip breathing techniques or other breathing techniques. ;To learn and demonstrate proper use of respiratory medications    Long  Term Goals Maintenance of O2 saturations>88%;Compliance with respiratory medication;Verbalizes importance of monitoring SPO2 with pulse oximeter and return demonstration;Exhibits proper breathing techniques, such as pursed lip breathing or other method taught during program session;Demonstrates proper use of MDI's    Goals/Expected Outcomes Compliance and understanding of oxygen  saturation monitoring and breathing techniques to decrease shortness of breath.          Initial Exercise Prescription:  Initial Exercise Prescription - 02/18/24 1000       Date of Initial Exercise RX and Referring Provider   Date  02/18/24    Referring Provider Levander Staff   Expected Discharge Date 05/16/24      Treadmill   MPH 2.5    Grade 0    Minutes 15    METs 2.91      Recumbant Elliptical   Level 2    RPM 253    Watts 50    Minutes 15    METs 2.5      Prescription Details   Frequency (times per week) 2    Duration Progress to 30 minutes of continuous aerobic without signs/symptoms of physical distress      Intensity   THRR 40-80% of Max Heartrate 57-114    Ratings of Perceived Exertion 11-13    Perceived Dyspnea 0-4      Progression   Progression Continue to progress workloads to maintain intensity without signs/symptoms of physical distress.  Resistance Training   Training Prescription Yes    Weight blue bands    Reps 10-15          Perform Capillary Blood Glucose checks as needed.  Exercise Prescription Changes:   Exercise Prescription Changes     Row Name 02/29/24 1200 03/14/24 1100 03/23/24 1146 04/11/24 1200 04/25/24 1200     Response to Exercise   Blood Pressure (Admit) 104/58 112/62 120/60 126/84 124/64   Blood Pressure (Exercise) 128/56 122/62 -- 96/50 156/80   Blood Pressure (Exit) 96/58 130/60 98/58 104/56 110/78   Heart Rate (Admit) 78 bpm 70 bpm 70 bpm 84 bpm 75 bpm   Heart Rate (Exercise) 116 bpm 105 bpm 107 bpm 120 bpm 112 bpm   Heart Rate (Exit) 92 bpm 84 bpm 87 bpm 98 bpm 83 bpm   Oxygen  Saturation (Admit) 97 % 96 % 96 % 97 % 96 %   Oxygen  Saturation (Exercise) 95 % 94 % 95 % 95 % 94 %   Oxygen  Saturation (Exit) 94 % 96 % 96 % 96 % 97 %   Rating of Perceived Exertion (Exercise) 13 10 9 11 11    Perceived Dyspnea (Exercise) 3 1 1 2 1    Duration Continue with 30 min of aerobic exercise without signs/symptoms of physical distress. Continue with 30 min of aerobic exercise without signs/symptoms of physical distress. Continue with 30 min of aerobic exercise without signs/symptoms of physical distress. Continue with 30 min of aerobic exercise without  signs/symptoms of physical distress. Continue with 30 min of aerobic exercise without signs/symptoms of physical distress.   Intensity THRR unchanged THRR unchanged THRR unchanged THRR unchanged THRR unchanged     Progression   Progression -- -- Continue to progress workloads to maintain intensity without signs/symptoms of physical distress. Continue to progress workloads to maintain intensity without signs/symptoms of physical distress. Continue to progress workloads to maintain intensity without signs/symptoms of physical distress.   Average METs -- -- 5 4.8 5.5     Resistance Training   Training Prescription Yes Yes Yes Yes Yes   Weight blue bands blue bands blue bands blue bands blue bands   Reps 10-15 10-15 10-15 10-15 10-15   Time 10 Minutes 10 Minutes 10 Minutes 10 Minutes 10 Minutes     Treadmill   MPH 2.5 2.5 2.7 3 3    Grade 1 1.5 2 2 2    Minutes 15 15 15 15 15    METs 3.1 3.41 3.7 4 4      Recumbant Elliptical   Level 3 3 4 4 4    RPM 64 68 68 67 74   Watts 92 -- -- -- --   Minutes 15 15 15 15 15    METs 4.3 5.1 5 4.8 5.5    Row Name 05/09/24 1200 05/18/24 1201           Response to Exercise   Blood Pressure (Admit) 112/70 122/62      Blood Pressure (Exercise) 128/66 --      Blood Pressure (Exit) 104/61 120/60      Heart Rate (Admit) 71 bpm 74 bpm      Heart Rate (Exercise) 106 bpm 116 bpm      Heart Rate (Exit) 90 bpm 93 bpm      Oxygen  Saturation (Admit) 98 % 96 %      Oxygen  Saturation (Exercise) 93 % 95 %      Oxygen  Saturation (Exit) 95 % 96 %      Rating of  Perceived Exertion (Exercise) 9 11      Perceived Dyspnea (Exercise) 1 1      Duration Continue with 30 min of aerobic exercise without signs/symptoms of physical distress. Continue with 30 min of aerobic exercise without signs/symptoms of physical distress.      Intensity THRR unchanged THRR unchanged        Progression   Progression Continue to progress workloads to maintain intensity without  signs/symptoms of physical distress. Continue to progress workloads to maintain intensity without signs/symptoms of physical distress.        Resistance Training   Training Prescription Yes Yes      Weight blue bands blue bands      Reps 10-15 10-15      Time 10 Minutes 10 Minutes        Treadmill   MPH 3 3      Grade 3 3      Minutes 15 15      METs 4.3 4.3        Recumbant Elliptical   Level 5 5      RPM 67 67      Watts 110 --      Minutes 15 15      METs 5.3 5.1         Exercise Comments:   Exercise Comments     Row Name 02/22/24 1528 03/23/24 1528         Exercise Comments Pt completed first day of exercise. Braedon averaged 2.7 METs at 2.4 mph on the treadmill and 5.4 METs at level 2 on the recumbent elliptical. He performed the warmup and cooldown standing without limitations. Discussed METs. Completed home exercise plan Linley is not currently exercising outside of rehab. He has access to the [facility]. I encouraged him to attend the [facility] and walk on the treadmill or ride the bike. I recommended attending the [facility] 1 non-rehab day/wk for 30 min/day. He agreed with my recommendations. Adem seems motivated to exercise outside of rehab.         Exercise Goals and Review:   Exercise Goals     Row Name 02/18/24 [phone]             Exercise Goals   Increase Physical Activity Yes       Intervention Provide advice, education, support and counseling about physical activity/exercise needs.;Develop an individualized exercise prescription for aerobic and resistive training based on initial evaluation findings, risk stratification, comorbidities and participant's personal goals.       Expected Outcomes Short Term: Attend rehab on a regular basis to increase amount of physical activity.;Long Term: Add in home exercise to make exercise part of routine and to increase amount of physical activity.;Long Term: Exercising regularly at least 3-5 days a week.       Increase Strength and  Stamina Yes       Intervention Provide advice, education, support and counseling about physical activity/exercise needs.;Develop an individualized exercise prescription for aerobic and resistive training based on initial evaluation findings, risk stratification, comorbidities and participant's personal goals.       Expected Outcomes Short Term: Increase workloads from initial exercise prescription for resistance, speed, and METs.;Short Term: Perform resistance training exercises routinely during rehab and add in resistance training at home;Long Term: Improve cardiorespiratory fitness, muscular endurance and strength as measured by increased METs and functional capacity ( )       Able to understand and use rate of perceived exertion (RPE) scale Yes  Intervention Provide education and explanation on how to use RPE scale       Expected Outcomes Short Term: Able to use RPE daily in rehab to express subjective intensity level;Long Term:  Able to use RPE to guide intensity level when exercising independently       Able to understand and use Dyspnea scale Yes       Intervention Provide education and explanation on how to use Dyspnea scale       Expected Outcomes Short Term: Able to use Dyspnea scale daily in rehab to express subjective sense of shortness of breath during exertion;Long Term: Able to use Dyspnea scale to guide intensity level when exercising independently       Knowledge and understanding of Target Heart Rate Range (THRR) Yes       Intervention Provide education and explanation of THRR including how the numbers were predicted and where they are located for reference       Expected Outcomes Short Term: Able to state/look up THRR;Long Term: Able to use THRR to govern intensity when exercising independently;Short Term: Able to use daily as guideline for intensity in rehab       Understanding of Exercise Prescription Yes       Intervention Provide education, explanation, and written  materials on patient's individual exercise prescription       Expected Outcomes Short Term: Able to explain program exercise prescription;Long Term: Able to explain home exercise prescription to exercise independently          Exercise Goals Re-Evaluation :  Exercise Goals Re-Evaluation     Row Name 02/23/24 0842 03/24/24 0937 04/21/24 0820 05/17/24 0955       Exercise Goal Re-Evaluation   Exercise Goals Review Increase Physical Activity;Able to understand and use Dyspnea scale;Understanding of Exercise Prescription;Increase Strength and Stamina;Knowledge and understanding of Target Heart Rate Range (THRR);Able to understand and use rate of perceived exertion (RPE) scale Increase Physical Activity;Able to understand and use Dyspnea scale;Understanding of Exercise Prescription;Increase Strength and Stamina;Knowledge and understanding of Target Heart Rate Range (THRR);Able to understand and use rate of perceived exertion (RPE) scale Increase Physical Activity;Able to understand and use Dyspnea scale;Understanding of Exercise Prescription;Increase Strength and Stamina;Knowledge and understanding of Target Heart Rate Range (THRR);Able to understand and use rate of perceived exertion (RPE) scale Increase Physical Activity;Able to understand and use Dyspnea scale;Understanding of Exercise Prescription;Increase Strength and Stamina;Knowledge and understanding of Target Heart Rate Range (THRR);Able to understand and use rate of perceived exertion (RPE) scale    Comments Pervis has completed 1 exercise session. He averages 2.7 METs at 2.4 mph on the treadmill and 5.4 METs at level 2 on the recumbent elliptical. Eleno performs the warmup and cooldown standing without limitations. It is too soon to notate any discernable progressions. Will continue to monitor and progress as able. Kailon has completed 10 exercise sessions. He averages 3.7 METs at 2.7 mph and 2% incline on the treadmill and 5 METs at level 4 on  the recumbent elliptical. Atley performs the warmup and cooldown standing without limitations. He has increased his speed and incline on the treadmill and level on the Nustep. He tolerates progressions well. Bronsen does need encouragement to increase his levels. We have recently discussed home exercise as I encouraged Rajiv to attend the [facility] weekly. Will continue to monitor and progress as able. Tiburcio has completed 17 exercise sessions. He averages 4 METs at 3 mph and 2% incline on the treadmill and 5.3 METs at level 4  on the recumbent elliptical. Ellijah performs the warmup and cooldown standing without limitations. He has increased his speed on the treadmill as METs have slightly increased. His level on the recumbent elliptical has remained the same, although speed has increased. We have discussed home exercise as I encouraged Nassim to exercise at the [facility]. Will continue to monitor and progress as able. Vedder has completed 23exercise sessions. Tesean exercises for 15 min on the treadmill and recumbent elliptical. He averages 4 METs at 3 mph and 2% incline on the treadmill and 5 METs at level 5 on the recumbent elliptical. Anton performs the warmup and cooldown standing without limitations. He has increased level on the recumbent elliptical as METs remain relatively the same. Will continue to monitor and progress as able.    Expected Outcomes Through exercise at rehab and home, the patient will decrease shortness of breath with daily activities and feel confident carrying out an exercise regimn at home. Through exercise at rehab and home, the patient will decrease shortness of breath with daily activities and feel confident carrying out an exercise regimn at home. Through exercise at rehab and home, the patient will decrease shortness of breath with daily activities and feel confident carrying out an exercise regimn at home. Through exercise at rehab and home, the patient will decrease shortness of breath  with daily activities and feel confident carrying out an exercise regimn at home.       Discharge Exercise Prescription (Final Exercise Prescription Changes):  Exercise Prescription Changes - 05/18/24 1201       Response to Exercise   Blood Pressure (Admit) 122/62    Blood Pressure (Exit) 120/60    Heart Rate (Admit) 74 bpm    Heart Rate (Exercise) 116 bpm    Heart Rate (Exit) 93 bpm    Oxygen  Saturation (Admit) 96 %    Oxygen  Saturation (Exercise) 95 %    Oxygen  Saturation (Exit) 96 %    Rating of Perceived Exertion (Exercise) 11    Perceived Dyspnea (Exercise) 1    Duration Continue with 30 min of aerobic exercise without signs/symptoms of physical distress.    Intensity THRR unchanged      Progression   Progression Continue to progress workloads to maintain intensity without signs/symptoms of physical distress.      Resistance Training   Training Prescription Yes    Weight blue bands    Reps 10-15    Time 10 Minutes      Treadmill   MPH 3    Grade 3    Minutes 15    METs 4.3      Recumbant Elliptical   Level 5    RPM 67    Minutes 15    METs 5.1          Nutrition:  Target Goals: Understanding of nutrition guidelines, daily intake of sodium 1500mg , cholesterol 200mg , calories 30% from fat and 7% or less from saturated fats, daily to have 5 or more servings of fruits and vegetables.  Biometrics:    Nutrition Therapy Plan and Nutrition Goals:  Nutrition Therapy & Goals - 05/23/24 1047       Nutrition Therapy   Diet General Healthy Diet    Drug/Food Interactions Statins/Certain Fruits      Personal Nutrition Goals   Nutrition Goal Patient to identify strategies for weight gain of 0.5-2.0# per week.   goal not met.   Personal Goal #2 --   goal in progress.   Comments Goal  not met. Aseem has medical history of HTN, CAD, OSA, dementia, CKD3, hyperlipidemia, alcohol abuse. He is motivated to gain ~5# to 170#: Patient is down 3.3# since starting with our  program; will continue to monitor weight. Eating frequency remains variable and he does skip meals. We have discussed strategies for weight gain including increasing eating frequency, calorie density, etc. He continues to complete group therapy weekly through the [facility]. Jhon will continue to benefit from participation in pulmonary rehab for nutrition, exercise, and lifestyle modification.      Intervention Plan   Intervention Prescribe, educate and counsel regarding individualized specific dietary modifications aiming towards targeted core components such as weight, hypertension, lipid management, diabetes, heart failure and other comorbidities.;Nutrition handout(s) given to patient.    Expected Outcomes Short Term Goal: Understand basic principles of dietary content, such as calories, fat, sodium, cholesterol and nutrients.;Long Term Goal: Adherence to prescribed nutrition plan.          Nutrition Assessments:  MEDIFICTS Score Key: >=70 Need to make dietary changes  40-70 Heart Healthy Diet <= 40 Therapeutic Level Cholesterol Diet  Flowsheet Row PULMONARY REHAB CHRONIC OBSTRUCTIVE PULMONARY DISEASE from 07/17/2021 in [facility] for Heart, Vascular, & Lung Health  Picture Your Plate Total Score on Discharge 53   Picture Your Plate Scores: <59 Unhealthy dietary pattern with much room for improvement. 41-50 Dietary pattern unlikely to meet recommendations for good health and room for improvement. 51-60 More healthful dietary pattern, with some room for improvement.  >60 Healthy dietary pattern, although there may be some specific behaviors that could be improved.    Nutrition Goals Re-Evaluation:  Nutrition Goals Re-Evaluation     Row Name 02/24/24 1102 03/23/24 1205 04/20/24 1012 05/23/24 1047       Goals   Current Weight 163 lb 12.8 oz (74.3 kg) 164 lb 10.9 oz (74.7 kg) 162 lb 4.1 oz (73.6 kg) 160 lb 7.9 oz (72.8 kg)    Comment Thiamine WNL, HDL 34, LDL 59,A1C  6.0, GFR 49, Cr 1.38 Thiamine WNL, HDL 34, LDL 59,A1C 6.0, GFR 49, Cr 1.38 Thiamine WNL, HDL 34, LDL 59,A1C 6.0, GFR 49, Cr 1.38 Thiamine WNL, HDL 34, LDL 59,A1C 6.0, GFR 49, Cr 1.38    Expected Outcome Rylynn has medical history of HTN, CAD, OSA, dementia, CKD3, hyperlipidemia, alcohol abuse. He is motivated to gain ~5# to 170#. We discussed strategies for weight gain including increasing eating frequency, etc. He continues to complete group therapy weekly through the [facility]. Jacen will continue to benefit from participation in pulmonary rehab for nutrition, exercise, and lifestyle modification. Goal not met at this time. Tsuneo has medical history of HTN, CAD, OSA, dementia, CKD3, hyperlipidemia, alcohol abuse. He is motivated to gain ~5# to 170#. We discussed strategies for weight gain including increasing eating frequency, etc. He has maintained his weight since starting with our program. He continues to complete group therapy weekly through the [facility]. Troy will continue to benefit from participation in pulmonary rehab for nutrition, exercise, and lifestyle modification. Goal not met. Jaden has medical history of HTN, CAD, OSA, dementia, CKD3, hyperlipidemia, alcohol abuse. He is motivated to gain ~5# to 170#: patient is down 1.5# since starting with our program. BMI is appropriate for age at this time; will continue to monitor weight. We have discussed strategies for weight gain including increasing eating frequency, calorie density etc. He continues to complete group therapy weekly through the [facility]. Carroll will continue to benefit from participation in pulmonary rehab  for nutrition, exercise, and lifestyle modification. Goal not met. Fawaz has medical history of HTN, CAD, OSA, dementia, CKD3, hyperlipidemia, alcohol abuse. He is motivated to gain ~5# to 170#: Patient is down 3.3# since starting with our program; will continue to monitor weight. Eating frequency remains variable and he does skip meals. We have  discussed strategies for weight gain including increasing eating frequency, calorie density, etc. He continues to complete group therapy weekly through the [facility]. Naftuli will continue to benefit from participation in pulmonary rehab for nutrition, exercise, and lifestyle modification.       Nutrition Goals Discharge (Final Nutrition Goals Re-Evaluation):  Nutrition Goals Re-Evaluation - 05/23/24 1047       Goals   Current Weight 160 lb 7.9 oz (72.8 kg)    Comment Thiamine WNL, HDL 34, LDL 59,A1C 6.0, GFR 49, Cr 1.38    Expected Outcome Goal not met. Neiko has medical history of HTN, CAD, OSA, dementia, CKD3, hyperlipidemia, alcohol abuse. He is motivated to gain ~5# to 170#: Patient is down 3.3# since starting with our program; will continue to monitor weight. Eating frequency remains variable and he does skip meals. We have discussed strategies for weight gain including increasing eating frequency, calorie density, etc. He continues to complete group therapy weekly through the [facility]. Eston will continue to benefit from participation in pulmonary rehab for nutrition, exercise, and lifestyle modification.          Psychosocial: Target Goals: Acknowledge presence or absence of significant depression and/or stress, maximize coping skills, provide positive support system. Participant is able to verbalize types and ability to use techniques and skills needed for reducing stress and depression.  Initial Review & Psychosocial Screening:  Initial Psych Review & Screening - 02/18/24 0934       Initial Review   Current issues with None Identified      Family Dynamics   Good Support System? Yes      Barriers   Psychosocial barriers to participate in program There are no identifiable barriers or psychosocial needs.      Screening Interventions   Interventions Encouraged to exercise          Quality of Life Scores:  Scores of 19 and below usually indicate a poorer quality of life in these  areas.  A difference of  2-3 points is a clinically meaningful difference.  A difference of 2-3 points in the total score of the Quality of Life Index has been associated with significant improvement in overall quality of life, self-image, physical symptoms, and general health in studies assessing change in quality of life.  PHQ-9: Review Flowsheet  More data exists      03/23/2024 02/18/2024 12/22/2023 11/16/2023 06/21/2023  Depression screen PHQ 2/9  Decreased Interest 0 0 0 0 1  Down, Depressed, Hopeless 0 0 0 0 0  PHQ - 2 Score 0 0 0 0 1  Altered sleeping 0 1 - - -  Tired, decreased energy 0 1 - - -  Change in appetite 0 1 - - -  Feeling bad or failure about yourself  0 0 - - -  Trouble concentrating 0 0 - - -  Moving slowly or fidgety/restless 3 0 - - -  Suicidal thoughts 0 0 - - -  PHQ-9 Score 3 3 - - -  Difficult doing work/chores Not difficult at all Somewhat difficult - - -   Interpretation of Total Score  Total Score Depression Severity:  1-4 = Minimal depression, 5-9 =  Mild depression, 10-14 = Moderate depression, 15-19 = Moderately severe depression, 20-27 = Severe depression   Psychosocial Evaluation and Intervention:  Psychosocial Evaluation - 02/18/24 1002       Psychosocial Evaluation & Interventions   Interventions Encouraged to exercise with the program and follow exercise prescription    Comments No psychosocial concerns identified at this time    Expected Outcomes For Jamareon to participate in PR free of any psychosocial barriers or concerns    Continue Psychosocial Services  No Follow up required          Psychosocial Re-Evaluation:  Psychosocial Re-Evaluation     Row Name 02/21/24 0926 03/22/24 1110 04/19/24 1124 05/15/24 0952 05/22/24 0818     Psychosocial Re-Evaluation   Current issues with None Identified None Identified None Identified None Identified None Identified   Comments Yuriy is scheduled to start PR on 02/22/24. No new psychosocial barriers or  concerns since orientation on 02/18/24. Sulo denies any new psychosocial barriers or concerns at this time. He continues to attend weekly therapy through the [facility]. Karis is doing great in the program. He enjoys coming to class and has even made some friends. He denies any new barriers or concerns at this time. Jakwan continues to deny any psychosocial barriers or concerns at this time. Monthly Re-eval: Rockwell continues to deny any psychosocial barriers or concerns at this time. He denies any need for additional resources or referrals.   Expected Outcomes For Eain to participate in PR free of any psychosocial barriers or concerns For Johan to participate in PR free of any psychosocial barriers or concerns For Caeleb to participate in PR free of any psychosocial barriers or concerns For Yanis to participate in PR free of any psychosocial barriers or concerns For Marsden to continue to participate in PR free of any psychosocial barriers or concerns   Interventions Encouraged to attend Pulmonary Rehabilitation for the exercise Encouraged to attend Pulmonary Rehabilitation for the exercise Encouraged to attend Pulmonary Rehabilitation for the exercise Encouraged to attend Pulmonary Rehabilitation for the exercise Encouraged to attend Pulmonary Rehabilitation for the exercise   Continue Psychosocial Services  No Follow up required No Follow up required No Follow up required No Follow up required No Follow up required      Psychosocial Discharge (Final Psychosocial Re-Evaluation):  Psychosocial Re-Evaluation - 05/22/24 0818       Psychosocial Re-Evaluation   Current issues with None Identified    Comments Monthly Re-eval: Ruvim continues to deny any psychosocial barriers or concerns at this time. He denies any need for additional resources or referrals.    Expected Outcomes For Trejan to continue to participate in PR free of any psychosocial barriers or concerns    Interventions Encouraged to attend  Pulmonary Rehabilitation for the exercise    Continue Psychosocial Services  No Follow up required          Education: Education Goals: Education classes will be provided on a weekly basis, covering required topics. Participant will state understanding/return demonstration of topics presented.  Learning Barriers/Preferences:  Learning Barriers/Preferences - 02/18/24 0936       Learning Barriers/Preferences   Learning Barriers Sight   wears glasses   Learning Preferences Audio;Computer/Internet;Group Instruction;Individual Instruction;Pictoral;Skilled Demonstration;Written Material          Education Topics: Know Your Numbers Group instruction that is supported by a PowerPoint presentation. Instructor discusses importance of knowing and understanding resting, exercise, and post-exercise oxygen  saturation, heart rate, and blood pressure. Oxygen  saturation, heart rate,  blood pressure, rating of perceived exertion, and dyspnea are reviewed along with a normal range for these values.  Flowsheet Row PULMONARY REHAB CHRONIC OBSTRUCTIVE PULMONARY DISEASE from 05/11/2024 in [facility] for Heart, Vascular, & Lung Health  Date 05/11/24  Educator EP  Instruction Review Code 1- Verbalizes Understanding    Exercise for the Pulmonary Patient Group instruction that is supported by a PowerPoint presentation. Instructor discusses benefits of exercise, core components of exercise, frequency, duration, and intensity of an exercise routine, importance of utilizing pulse oximetry during exercise, safety while exercising, and options of places to exercise outside of rehab.  Flowsheet Row PULMONARY REHAB CHRONIC OBSTRUCTIVE PULMONARY DISEASE from 05/04/2024 in [facility] for Heart, Vascular, & Lung Health  Date 05/04/24  Educator EP  Instruction Review Code 1- Verbalizes Understanding    MET Level  Group instruction provided by PowerPoint, verbal  discussion, and written material to support subject matter. Instructor reviews what METs are and how to increase METs.  Flowsheet Row PULMONARY REHAB CHRONIC OBSTRUCTIVE PULMONARY DISEASE from 04/06/2024 in [facility] for Heart, Vascular, & Lung Health  Date 04/06/24  Educator EP  Instruction Review Code 1- Verbalizes Understanding    Pulmonary Medications Verbally interactive group education provided by instructor with focus on inhaled medications and proper administration. Flowsheet Row PULMONARY REHAB CHRONIC OBSTRUCTIVE PULMONARY DISEASE from 04/27/2024 in [facility] for Heart, Vascular, & Lung Health  Date 04/27/24  Educator RT  Instruction Review Code 1- Verbalizes Understanding    Anatomy and Physiology of the Respiratory System Group instruction provided by PowerPoint, verbal discussion, and written material to support subject matter. Instructor reviews respiratory cycle and anatomical components of the respiratory system and their functions. Instructor also reviews differences in obstructive and restrictive respiratory diseases with examples of each.  Flowsheet Row PULMONARY REHAB CHRONIC OBSTRUCTIVE PULMONARY DISEASE from 04/20/2024 in [facility] for Heart, Vascular, & Lung Health  Date 04/20/24  Educator RT  Instruction Review Code 1- Verbalizes Understanding    Oxygen  Safety Group instruction provided by PowerPoint, verbal discussion, and written material to support subject matter. There is an overview of "What is Oxygen " and "Why do we need it".  Instructor also reviews how to create a safe environment for oxygen  use, the importance of using oxygen  as prescribed, and the risks of noncompliance. There is a brief discussion on traveling with oxygen  and resources the patient may utilize. Flowsheet Row PULMONARY REHAB CHRONIC OBSTRUCTIVE PULMONARY DISEASE from 05/18/2024 in [facility] for  Heart, Vascular, & Lung Health  Date 05/18/24  Educator RN  Instruction Review Code 1- Verbalizes Understanding    Oxygen  Use Group instruction provided by PowerPoint, verbal discussion, and written material to discuss how supplemental oxygen  is prescribed and different types of oxygen  supply systems. Resources for more information are provided.  Flowsheet Row PULMONARY REHAB CHRONIC OBSTRUCTIVE PULMONARY DISEASE from 03/02/2024 in [facility] for Heart, Vascular, & Lung Health  Date 03/02/24  Educator RT  Instruction Review Code 1- Verbalizes Understanding    Breathing Techniques Group instruction that is supported by demonstration and informational handouts. Instructor discusses the benefits of pursed lip and diaphragmatic breathing and detailed demonstration on how to perform both.  Flowsheet Row PULMONARY REHAB CHRONIC OBSTRUCTIVE PULMONARY DISEASE from 03/09/2024 in [facility] for Heart, Vascular, & Lung Health  Date 03/09/24  Educator RN  Instruction Review Code 1-  Verbalizes Understanding     Risk Factor Reduction Group instruction that is supported by a PowerPoint presentation. Instructor discusses the definition of a risk factor, different risk factors for pulmonary disease, and how the heart and lungs work together. Flowsheet Row PULMONARY REHAB CHRONIC OBSTRUCTIVE PULMONARY DISEASE from 03/30/2024 in [facility] for Heart, Vascular, & Lung Health  Date 03/30/24  Educator EP  Instruction Review Code 1- Verbalizes Understanding    Pulmonary Diseases Group instruction provided by PowerPoint, verbal discussion, and written material to support subject matter. Instructor gives an overview of the different type of pulmonary diseases. There is also a discussion on risk factors and symptoms as well as ways to manage the diseases. Flowsheet Row PULMONARY REHAB CHRONIC OBSTRUCTIVE PULMONARY DISEASE from 04/13/2024 in  [facility] for Heart, Vascular, & Lung Health  Date 04/13/24  Educator RT  Instruction Review Code 1- Verbalizes Understanding    Stress and Energy Conservation Group instruction provided by PowerPoint, verbal discussion, and written material to support subject matter. Instructor gives an overview of stress and the impact it can have on the body. Instructor also reviews ways to reduce stress. There is also a discussion on energy conservation and ways to conserve energy throughout the day. Flowsheet Row PULMONARY REHAB CHRONIC OBSTRUCTIVE PULMONARY DISEASE from 03/16/2024 in [facility] for Heart, Vascular, & Lung Health  Date 03/16/24  Educator RN  Instruction Review Code 1- Verbalizes Understanding    Warning Signs and Symptoms Group instruction provided by PowerPoint, verbal discussion, and written material to support subject matter. Instructor reviews warning signs and symptoms of stroke, heart attack, cold and flu. Instructor also reviews ways to prevent the spread of infection. Flowsheet Row PULMONARY REHAB CHRONIC OBSTRUCTIVE PULMONARY DISEASE from 03/23/2024 in [facility] for Heart, Vascular, & Lung Health  Date 03/23/24  Educator RN  Instruction Review Code 1- Verbalizes Understanding    Other Education Group or individual verbal, written, or video instructions that support the educational goals of the pulmonary rehab program. Flowsheet Row PULMONARY REHAB CHRONIC OBSTRUCTIVE PULMONARY DISEASE from 07/17/2021 in [facility] for Heart, Vascular, & Lung Health  Date 07/17/21  Educator Handout  [How to beat a sedentary lifestyle]     Knowledge Questionnaire Score:  Knowledge Questionnaire Score - 02/18/24 1000       Knowledge Questionnaire Score   Pre Score 14/18          Core Components/Risk Factors/Patient Goals at Admission:  Personal Goals and Risk Factors at Admission -  02/18/24 0937       Core Components/Risk Factors/Patient Goals on Admission    Weight Management Yes;Weight Gain    Intervention Weight Management: Develop a combined nutrition and exercise program designed to reach desired caloric intake, while maintaining appropriate intake of nutrient and fiber, sodium and fats, and appropriate energy expenditure required for the weight goal.;Weight Management: Provide education and appropriate resources to help participant work on and attain dietary goals.;Weight Management/Obesity: Establish reasonable short term and long term weight goals.;Obesity: Provide education and appropriate resources to help participant work on and attain dietary goals.    Admit Weight 163 lb 12.8 oz (74.3 kg)    Expected Outcomes Short Term: Continue to assess and modify interventions until short term weight is achieved;Long Term: Adherence to nutrition and physical activity/exercise program aimed toward attainment of established weight goal;Weight Gain: Understanding of general recommendations for a high calorie, high protein meal plan  that promotes weight gain by distributing calorie intake throughout the day with the consumption for 4-5 meals, snacks, and/or supplements;Understanding recommendations for meals to include 15-35% energy as protein, 25-35% energy from fat, 35-60% energy from carbohydrates, less than 200mg  of dietary cholesterol, 20-35 gm of total fiber daily;Understanding of distribution of calorie intake throughout the day with the consumption of 4-5 meals/snacks    Improve shortness of breath with ADL's Yes    Intervention Provide education, individualized exercise plan and daily activity instruction to help decrease symptoms of SOB with activities of daily living.    Expected Outcomes Short Term: Improve cardiorespiratory fitness to achieve a reduction of symptoms when performing ADLs;Long Term: Be able to perform more ADLs without symptoms or delay the onset of symptoms           Core Components/Risk Factors/Patient Goals Review:   Goals and Risk Factor Review     Row Name 02/21/24 0928 03/22/24 1111 04/19/24 1126 05/15/24 0953 05/22/24 0821     Core Components/Risk Factors/Patient Goals Review   Personal Goals Review Weight Management/Obesity;Improve shortness of breath with ADL's;Develop more efficient breathing techniques such as purse lipped breathing and diaphragmatic breathing and practicing self-pacing with activity. Weight Management/Obesity;Improve shortness of breath with ADL's;Develop more efficient breathing techniques such as purse lipped breathing and diaphragmatic breathing and practicing self-pacing with activity. Weight Management/Obesity;Improve shortness of breath with ADL's Weight Management/Obesity;Improve shortness of breath with ADL's Weight Management/Obesity;Improve shortness of breath with ADL's   Review Nael is scheduled to start PR on 02/22/24. Unable to assess his goals at this time. We will continue to monitor his progress throughout the program. Monthly review of patient's Core Components/Risk Factors/Patient Goals are as follows: Goal progressing for improving shortness of breath with ADL's. Ryszard is currently exercising on RA  to maintain sats >88%. He is currently exercising on the treadmill and the recumbant elliptical. Goal met for developing more efficient breathing techniques such as purse lipped breathing and diaphragmatic breathing; and practicing self-pacing with activity. Carole has attended the breathing techniques education and has been practicing diaphragmatic breathing at home. He is able to demonstrate purse lip breathing when he gets SOB as well as self pace based on the RPE/dyspnea scale. Goal progressing for weight gain . Johnathon is working with staff dietitian to achieve his weight gain goals. Monthly review of patient's Core Components/Risk Factors/Patient Goals are as follows: Goal progressing for improving shortness  of breath with ADL's. Aeon is currently exercising on RA  to maintain sats >88%. He is currently exercising on the treadmill and the recumbant elliptical. Goal progressing for weight gain. We will continue to monitor his progress throughout the program. Monthly review of patient's Core Components/Risk Factors/Patient Goals are as follows: Goal progressing for improving shortness of breath with ADL's. Takuma is currently exercising on RA  to maintain sats >88%. He is currently exercising on the treadmill and the recumbant elliptical. Goal progressing for weight gain. We will continue to monitor his progress throughout the program. Monthly review of patient's Core Components/Risk Factors/Patient Goals are as follows: Goal progressing for improving shortness of breath with ADL's. Soma is currently exercising on RA to maintain sats >88%. He is exercising on the treadmill and the recumbent elliptical. He has been able to increase his workload, speed, and incline. He states that he can feel a difference in his ADLs since he's been coming to the program. Goal not met for weight gain. Dez has lost ~3.3# since starting the program. He has been  working with our dietitian on increasing calorie intake. We will continue to monitor his progress throughout the program.   Expected Outcomes To improve shortness of breath with ADL's, develop more efficient breathing techniques such as purse lipped breathing and diaphragmatic breathing; and practicing self-pacing with activity and gain weight. To improve shortness of breath with ADL's  and gain weight. To improve shortness of breath with ADL's  and gain weight. To improve shortness of breath with ADL's  and gain weight. Pt will show progress toward meeting expected goals and outcomes.      Core Components/Risk Factors/Patient Goals at Discharge (Final Review):   Goals and Risk Factor Review - 05/22/24 0821       Core Components/Risk Factors/Patient Goals Review    Personal Goals Review Weight Management/Obesity;Improve shortness of breath with ADL's    Review Monthly review of patient's Core Components/Risk Factors/Patient Goals are as follows: Goal progressing for improving shortness of breath with ADL's. Reilley is currently exercising on RA to maintain sats >88%. He is exercising on the treadmill and the recumbent elliptical. He has been able to increase his workload, speed, and incline. He states that he can feel a difference in his ADLs since he's been coming to the program. Goal not met for weight gain. Roldan has lost ~3.3# since starting the program. He has been working with our dietitian on increasing calorie intake. We will continue to monitor his progress throughout the program.    Expected Outcomes Pt will show progress toward meeting expected goals and outcomes.          ITP Comments:Pt is making expected progress toward Pulmonary Rehab goals after completing 24 session(s). Recommend continued exercise, life style modification, education, and utilization of breathing techniques to increase stamina and strength, while also decreasing shortness of breath with exertion.  Dr. Slater Staff is Medical Director for Pulmonary Rehab at [facility].

## 2024-05-25 ENCOUNTER — Encounter (HOSPITAL_COMMUNITY)
Admission: RE | Admit: 2024-05-25 | Discharge: 2024-05-25 | Disposition: A | Discharge status: Home / Self Care | Source: Ambulatory Visit | Attending: Physician Assistant | Admitting: Physician Assistant

## 2024-05-25 DIAGNOSIS — J449 Chronic obstructive pulmonary disease, unspecified: Secondary | ICD-10-CM | POA: Diagnosis not present

## 2024-05-25 NOTE — Progress Notes (Signed)
 Daily Session Note  Patient Details  Name: Ryan Diaz MRN: [id number] Date of Birth: Apr 05, 1946 Referring Provider:   [name] Pulmonary Rehab Walk Test from 02/18/2024 in [facility] for Heart, Vascular, & Lung Health  Referring Provider [name]    Encounter Date: 05/25/2024  Check In:  Session Check In - 05/25/24 1108       Check-In   Supervising physician immediately available to respond to emergencies [facility] MD immediately available    Physician(s) Rosabel Mose, NP    Location [facility]    Staff Present Johnnie Moats, MS, ACSM-CEP, Exercise Physiologist;Mary Harvy, RN, Wetzel Sharps, RT    Virtual Visit No    Medication changes reported     No    Fall or balance concerns reported    No    Tobacco Cessation No Change    Warm-up and Cool-down Performed as group-led instruction    Resistance Training Performed Yes    VAD Patient? No    PAD/SET Patient? No      Pain Assessment   Currently in Pain? No/denies    Multiple Pain Sites No          Capillary Blood Glucose: No results found for this or any previous visit (from the past 24 hours).    Social History   Tobacco Use  Smoking Status Former   Current packs/day: 0.00   Average packs/day: 1.5 packs/day for 35.0 years (52.5 ttl pk-yrs)   Types: Cigarettes   Start date: 09/04/1974   Quit date: 09/04/2009   Years since quitting: 14.7   Passive exposure: Past  Smokeless Tobacco Never    Goals Met:  Proper associated with RPD/PD & O2 Sat Exercise tolerated well No report of concerns or symptoms today Strength training completed today  Goals Unmet:  Not Applicable  Comments: Service time is from 1006 to 1138.    Dr. Slater Staff is Medical Director for Pulmonary Rehab at [facility].

## 2024-05-30 ENCOUNTER — Encounter (HOSPITAL_COMMUNITY)

## 2024-05-30 ENCOUNTER — Encounter (HOSPITAL_COMMUNITY)
Admission: RE | Admit: 2024-05-30 | Discharge: 2024-05-30 | Disposition: A | Discharge status: Home / Self Care | Source: Ambulatory Visit | Attending: Pulmonary Disease | Admitting: Pulmonary Disease

## 2024-05-30 DIAGNOSIS — J449 Chronic obstructive pulmonary disease, unspecified: Secondary | ICD-10-CM

## 2024-05-30 NOTE — Progress Notes (Signed)
 Daily Session Note  Patient Details  Name: Ryan Diaz MRN: [id number] Date of Birth: March 03, 1946 Referring Provider:   [name] Pulmonary Rehab Walk Test from 02/18/2024 in [facility] for Heart, Vascular, & Lung Health  Referring Provider [name]    Encounter Date: 05/30/2024  Check In:  Session Check In - 05/30/24 1154       Check-In   Supervising physician immediately available to respond to emergencies [facility] immediately available    Physician(s) Rosabel Mose, NP    [facility]    Staff Present Johnnie Moats, MS, ACSM-CEP, Exercise Physiologist;Mary Harvy, RN, Wetzel Sharps, RT    Virtual Visit No    Medication changes reported     No    Fall or balance concerns reported    No    Tobacco Cessation No Change    Warm-up and Cool-down Performed as group-led instruction    Resistance Training Performed Yes    VAD Patient? No    PAD/SET Patient? No      Pain Assessment   Currently in Pain? No/denies    Multiple Pain Sites No          Capillary Blood Glucose: No results found for this or any previous visit (from the past 24 hours).    Social History   Tobacco Use  Smoking Status Former   Current packs/day: 0.00   Average packs/day: 1.5 packs/day for 35.0 years (52.5 ttl pk-yrs)   Types: Cigarettes   Start date: 09/04/1974   Quit date: 09/04/2009   Years since quitting: 14.7   Passive exposure: Past  Smokeless Tobacco Never    Goals Met:  Proper associated with RPD/PD & O2 Sat Independence with exercise equipment Exercise tolerated well No report of concerns or symptoms today Strength training completed today  Goals Unmet:  Not Applicable  Comments: Service time is from 1004 to 1133.    Dr. Slater Staff is Medical Director for Pulmonary Rehab at [facility].

## 2024-06-01 ENCOUNTER — Encounter (HOSPITAL_COMMUNITY)
Admission: RE | Admit: 2024-06-01 | Discharge: 2024-06-01 | Disposition: A | Discharge status: Home / Self Care | Source: Ambulatory Visit | Attending: Physician Assistant | Admitting: Physician Assistant

## 2024-06-01 DIAGNOSIS — J449 Chronic obstructive pulmonary disease, unspecified: Secondary | ICD-10-CM | POA: Diagnosis not present

## 2024-06-01 NOTE — Progress Notes (Signed)
 Daily Session Note  Patient Details  Name: Ryan Diaz MRN: [id number] Date of Birth: [birth year] Referring Provider:   Conrad Diaz Pulmonary Rehab Walk Test from 02/18/2024 in [facility] for Heart, Vascular, & Lung Health  Referring Provider Ryan  [Diaz]    Encounter Date: 06/01/2024  Check In:  Session Check In - 06/01/24 1037       Check-In   Supervising physician immediately available to respond to emergencies [facility] immediately available    Physician(s) Ryan Louis, NP    [facility]    Staff Present Ryan Moats, MS, ACSM-CEP, Exercise Physiologist;Ryan Harvy, RN, BSN;Ryan Diaz, RT;Ryan Diaz, RD, LDN    Virtual Visit No    Medication changes reported     No    Fall or balance concerns reported    No    Tobacco Cessation No Change    Warm-up and Cool-down Performed as group-led instruction    Resistance Training Performed Yes    VAD Patient? No    PAD/SET Patient? No      Pain Assessment   Currently in Pain? No/denies    Multiple Pain Sites No          Capillary Blood Glucose: No results found for this or any previous visit (from the past 24 hours).    Social History   Tobacco Use  Smoking Status Former   Current packs/day: 0.00   Average packs/day: 1.5 packs/day for 35.0 years (52.5 ttl pk-yrs)   Types: Cigarettes   Start date: 09/04/1974   Quit date: 09/04/2009   Years since quitting: 14.7   Passive exposure: Past  Smokeless Tobacco Never    Goals Met:  Proper associated with RPD/PD & O2 Sat Independence with exercise equipment Exercise tolerated well No report of concerns or symptoms today Strength training completed today  Goals Unmet:  Not Applicable  Comments: Service time is from 1004 to 1137.    Dr. Slater Staff is Medical Director for Pulmonary Rehab at [facility].

## 2024-06-06 ENCOUNTER — Encounter (HOSPITAL_COMMUNITY)
Admission: RE | Admit: 2024-06-06 | Discharge: 2024-06-06 | Disposition: A | Discharge status: Home / Self Care | Source: Ambulatory Visit | Attending: Physician Assistant | Admitting: Physician Assistant

## 2024-06-06 VITALS — Wt 161.4 lb

## 2024-06-06 DIAGNOSIS — J449 Chronic obstructive pulmonary disease, unspecified: Secondary | ICD-10-CM | POA: Diagnosis not present

## 2024-06-06 NOTE — Progress Notes (Signed)
 Daily Session Note  Patient Details  Name: Ryan Diaz MRN: [id number] Date of Birth: [birth year] Referring Provider:   [name] Pulmonary Rehab Walk Test from 02/18/2024 in [facility] for Heart, Vascular, & Lung Health  Referring Provider [name]    Encounter Date: 06/06/2024  Check In:  Session Check In - 06/06/24 1106       Check-In   Supervising physician immediately available to respond to emergencies [facility] immediately available    Physician(s) Rosaline Skains, NP    [facility]    Staff Present Johnnie Moats, MS, ACSM-CEP, Exercise Physiologist;Charolette Bultman Harvy, RN, BSN;Casey Claudene Meline Belarus, RD, LDN    Virtual Visit No    Medication changes reported     No    Fall or balance concerns reported    No    Tobacco Cessation No Change    Warm-up and Cool-down Performed as group-led instruction    Resistance Training Performed Yes    VAD Patient? No    PAD/SET Patient? No      Pain Assessment   Currently in Pain? No/denies    Multiple Pain Sites No          Capillary Blood Glucose: No results found for this or any previous visit (from the past 24 hours).   Exercise Prescription Changes - 06/06/24 1200       Response to Exercise   Blood Pressure (Admit) 112/62    Blood Pressure (Exercise) 132/70    Blood Pressure (Exit) 96/68    Heart Rate (Admit) 68 bpm    Heart Rate (Exercise) 104 bpm    Heart Rate (Exit) 82 bpm    Oxygen  Saturation (Admit) 98 %    Oxygen  Saturation (Exercise) 95 %    Oxygen  Saturation (Exit) 97 %    Rating of Perceived Exertion (Exercise) 10    Perceived Dyspnea (Exercise) 1    Duration Continue with 30 min of aerobic exercise without signs/symptoms of physical distress.    Intensity THRR unchanged      Progression   Progression Continue to progress workloads to maintain intensity without signs/symptoms of physical distress.      Resistance Training   Training  Prescription Yes    Weight blue bands    Reps 10-15    Time 10 Minutes      Treadmill   MPH 3    Grade 3    Minutes 15    METs 4.3      Recumbant Elliptical   Level 6    RPM 61    Watts 97    Minutes 15    METs 5          Social History   Tobacco Use  Smoking Status Former   Current packs/day: 0.00   Average packs/day: 1.5 packs/day for 35.0 years (52.5 ttl pk-yrs)   Types: Cigarettes   Start date: 09/04/1974   Quit date: 09/04/2009   Years since quitting: 14.7   Passive exposure: Past  Smokeless Tobacco Never    Goals Met:  Exercise tolerated well No report of concerns or symptoms today Strength training completed today  Goals Unmet:  Not Applicable  Comments: Service time is from 1013 to 1134    Dr. Slater Staff is Medical Director for Pulmonary Rehab at [facility].

## 2024-06-08 ENCOUNTER — Encounter (HOSPITAL_COMMUNITY)
Admission: RE | Admit: 2024-06-08 | Discharge: 2024-06-08 | Disposition: A | Discharge status: Home / Self Care | Source: Ambulatory Visit | Attending: Physician Assistant | Admitting: Physician Assistant

## 2024-06-08 DIAGNOSIS — J449 Chronic obstructive pulmonary disease, unspecified: Secondary | ICD-10-CM

## 2024-06-08 NOTE — Progress Notes (Signed)
 Daily Session Note  Patient Details  Name: Ryan Diaz MRN: [id number] Date of Birth: February 22, 1946 Referring Provider:   [name] Pulmonary Rehab Walk Test from 02/18/2024 in [facility] for Heart, Vascular, & Lung Health  Referring Provider [name]    Encounter Date: 06/08/2024  Check In:  Session Check In - 06/08/24 1022       Check-In   Supervising physician immediately available to respond to emergencies [facility] immediately available    Physician(s) Lum Louis, NP    [facility]    Staff Present Johnnie Moats, MS, ACSM-CEP, Exercise Physiologist;Mary Harvy, RN, Wetzel Sharps, RT    Virtual Visit No    Medication changes reported     No    Fall or balance concerns reported    No    Tobacco Cessation No Change    Warm-up and Cool-down Performed as group-led instruction    Resistance Training Performed Yes    VAD Patient? No    PAD/SET Patient? No      Pain Assessment   Currently in Pain? No/denies    Multiple Pain Sites No          Capillary Blood Glucose: No results found for this or any previous visit (from the past 24 hours).    Social History   Tobacco Use  Smoking Status Former   Current packs/day: 0.00   Average packs/day: 1.5 packs/day for 35.0 years (52.5 ttl pk-yrs)   Types: Cigarettes   Start date: 09/04/1974   Quit date: 09/04/2009   Years since quitting: 14.7   Passive exposure: Past  Smokeless Tobacco Never    Goals Met:  Proper associated with RPD/PD & O2 Sat Exercise tolerated well No report of concerns or symptoms today Strength training completed today  Goals Unmet:  Not Applicable  Comments: Service time is from 1008 to 1131.    Dr. Slater Staff is Medical Director for Pulmonary Rehab at [facility].

## 2024-06-13 ENCOUNTER — Encounter (HOSPITAL_COMMUNITY)
Admission: RE | Admit: 2024-06-13 | Discharge: 2024-06-13 | Disposition: A | Discharge status: Home / Self Care | Source: Ambulatory Visit | Attending: Physician Assistant | Admitting: Physician Assistant

## 2024-06-13 DIAGNOSIS — J449 Chronic obstructive pulmonary disease, unspecified: Secondary | ICD-10-CM

## 2024-06-14 NOTE — Progress Notes (Signed)
 Daily Session Note  Patient Details  Name: Ryan Diaz MRN: [id number] Date of Birth: 08/09/1946 Referring Provider:   [name] Pulmonary Rehab Walk Test from 02/18/2024 in [facility] for Heart, Vascular, & Lung Health  Referring Provider [name]    Encounter Date: 06/13/2024  Check In:  Session Check In - 06/13/24 1156       Check-In   Supervising physician immediately available to respond to emergencies [facility] immediately available    Physician(s) Barnie Hila, NP    [facility]    Staff Present Johnnie Moats, MS, ACSM-CEP, Exercise Physiologist;Mary Harvy, RN, Wetzel Sharps, RT    Virtual Visit No    Medication changes reported     No    Fall or balance concerns reported    No    Tobacco Cessation No Change    Warm-up and Cool-down Performed as group-led instruction    Resistance Training Performed Yes    VAD Patient? No    PAD/SET Patient? No      Pain Assessment   Currently in Pain? No/denies    Multiple Pain Sites No          Capillary Blood Glucose: No results found for this or any previous visit (from the past 24 hours).    Social History   Tobacco Use  Smoking Status Former   Current packs/day: 0.00   Average packs/day: 1.5 packs/day for 35.0 years (52.5 ttl pk-yrs)   Types: Cigarettes   Start date: 09/04/1974   Quit date: 09/04/2009   Years since quitting: 14.7   Passive exposure: Past  Smokeless Tobacco Never    Goals Met:  Proper associated with RPD/PD & O2 Sat Independence with exercise equipment Exercise tolerated well No report of concerns or symptoms today Strength training completed today  Goals Unmet:  Not Applicable  Comments: Service time is from 1007 to 1128.    Dr. Slater Staff is Medical Director for Pulmonary Rehab at [facility].

## 2024-06-15 ENCOUNTER — Encounter (HOSPITAL_COMMUNITY)
Admission: RE | Admit: 2024-06-15 | Discharge: 2024-06-15 | Disposition: A | Discharge status: Home / Self Care | Source: Ambulatory Visit | Attending: Physician Assistant | Admitting: Physician Assistant

## 2024-06-15 DIAGNOSIS — J449 Chronic obstructive pulmonary disease, unspecified: Secondary | ICD-10-CM | POA: Diagnosis not present

## 2024-06-15 NOTE — Progress Notes (Signed)
 Daily Session Note  Patient Details  Name: Ryan Diaz MRN: [id number] Date of Birth: [birth year] Referring Provider:   [name] Pulmonary Rehab Walk Test from 02/18/2024 in [facility] for Heart, Vascular, & Lung Health  Referring Provider [name]    Encounter Date: 06/15/2024  Check In:  Session Check In - 06/15/24 1030       Check-In   Supervising physician immediately available to respond to emergencies [facility] immediately available    Physician(s) Josefa Beauvais, NP    [facility]    Staff Present Johnnie Moats, MS, ACSM-CEP, Exercise Physiologist;Mary Harvy, RN, BSN;Casey Claudene, RT;Ranita Stjulien BS, ACSM-CEP, Exercise Physiologist    Virtual Visit No    Medication changes reported     No    Fall or balance concerns reported    No    Tobacco Cessation No Change    Warm-up and Cool-down Performed as group-led instruction    Resistance Training Performed Yes    VAD Patient? No    PAD/SET Patient? No      Pain Assessment   Currently in Pain? No/denies    Multiple Pain Sites No          Capillary Blood Glucose: No results found for this or any previous visit (from the past 24 hours).    Social History   Tobacco Use  Smoking Status Former   Current packs/day: 0.00   Average packs/day: 1.5 packs/day for 35.0 years (52.5 ttl pk-yrs)   Types: Cigarettes   Start date: 09/04/1974   Quit date: 09/04/2009   Years since quitting: 14.7   Passive exposure: Past  Smokeless Tobacco Never    Goals Met:  Independence with exercise equipment Exercise tolerated well No report of concerns or symptoms today Strength training completed today  Goals Unmet:  Not Applicable  Comments: Service time is from 1007 to 1134.    Dr. Slater Staff is Medical Director for Pulmonary Rehab at [facility].

## 2024-06-20 ENCOUNTER — Encounter (HOSPITAL_COMMUNITY)
Admission: RE | Admit: 2024-06-20 | Discharge: 2024-06-20 | Disposition: A | Discharge status: Home / Self Care | Source: Ambulatory Visit | Attending: Pulmonary Disease | Admitting: Pulmonary Disease

## 2024-06-20 VITALS — Wt 164.7 lb

## 2024-06-20 DIAGNOSIS — J449 Chronic obstructive pulmonary disease, unspecified: Secondary | ICD-10-CM | POA: Insufficient documentation

## 2024-06-20 NOTE — Progress Notes (Signed)
 Daily Session Note  Patient Details  Name: Ryan Diaz MRN: [id number] Date of Birth: 1946/09/30 Referring Provider:   [name] Pulmonary Rehab Walk Test from 02/18/2024 in [facility] for Heart, Vascular, & Lung Health  Referring Provider [name]    Encounter Date: 06/20/2024  Check In:  Session Check In - 06/20/24 1134       Check-In   Supervising physician immediately available to respond to emergencies [facility] MD immediately available    Physician(s) Josefa Beauvais, NP    Location [facility]    Staff Present Johnnie Moats, MS, ACSM-CEP, Exercise Physiologist;Mary Harvy, RN, BSN;Casey Claudene, RT;Randi Reeve BS, ACSM-CEP, Exercise Physiologist    Virtual Visit No    Medication changes reported     No    Fall or balance concerns reported    No    Tobacco Cessation No Change    Warm-up and Cool-down Performed as group-led instruction    Resistance Training Performed Yes    VAD Patient? No    PAD/SET Patient? No      Pain Assessment   Currently in Pain? No/denies    Multiple Pain Sites No          Capillary Blood Glucose: No results found for this or any previous visit (from the past 24 hours).   Exercise Prescription Changes - 06/20/24 1200       Response to Exercise   Blood Pressure (Admit) 110/58    Blood Pressure (Exercise) 118/56    Blood Pressure (Exit) 108/62    Heart Rate (Admit) 82 bpm    Heart Rate (Exercise) 119 bpm    Heart Rate (Exit) 89 bpm    Oxygen  Saturation (Admit) 97 %    Oxygen  Saturation (Exercise) 95 %    Oxygen  Saturation (Exit) 96 %    Rating of Perceived Exertion (Exercise) 13    Perceived Dyspnea (Exercise) 2    Duration Continue with 30 min of aerobic exercise without signs/symptoms of physical distress.    Intensity THRR unchanged      Progression   Progression Continue to progress workloads to maintain intensity without signs/symptoms of physical distress.      Resistance Training    Training Prescription Yes    Weight blue bands    Reps 10-15    Time 10 Minutes      Treadmill   MPH 3.2    Grade 3    Minutes 15    METs 4.7      Recumbant Elliptical   Level 6    Minutes 15    METs 4.7          Social History   Tobacco Use  Smoking Status Former   Current packs/day: 0.00   Average packs/day: 1.5 packs/day for 35.0 years (52.5 ttl pk-yrs)   Types: Cigarettes   Start date: 09/04/1974   Quit date: 09/04/2009   Years since quitting: 14.8   Passive exposure: Past  Smokeless Tobacco Never    Goals Met:  Proper associated with RPD/PD & O2 Sat Independence with exercise equipment Exercise tolerated well No report of concerns or symptoms today Strength training completed today  Goals Unmet:  Not Applicable  Comments: Service time is from 1004 to 1138.    Dr. Slater Staff is Medical Director for Pulmonary Rehab at [facility].

## 2024-06-21 NOTE — Progress Notes (Signed)
 Pulmonary Individual Treatment Plan  Patient Details  Name: Ryan Diaz MRN: [id number] Date of Birth: Feb 17, 1946 Referring Provider:   [name] Pulmonary Rehab Walk Test from 02/18/2024 in [facility]  Referring Provider [name]    Initial Encounter Date:  Flowsheet Row Pulmonary Rehab Walk Test from 02/18/2024 in [facility]  Date 02/18/24    Visit Diagnosis: Chronic obstructive pulmonary disease, unspecified COPD type (HCC)  Patient's Home Medications on Admission:   Current Outpatient Medications:    albuterol  (ACCUNEB ) 0.63 MG/3ML nebulizer solution, Take 3 mLs (0.63 mg total) by nebulization every 6 (six) hours as needed for wheezing., Disp: 75 mL, Rfl: 12   albuterol  (VENTOLIN  HFA) 108 (90 Base) MCG/ACT inhaler, Inhale 2 puffs into the lungs every 6 (six) hours as needed for wheezing or shortness of breath., Disp: 18 g, Rfl: 12   aspirin  EC 81 MG tablet, Take 81 mg by mouth daily., Disp: , Rfl:    atorvastatin  (LIPITOR) 80 MG tablet, Take 1 tablet by mouth once daily, Disp: 90 tablet, Rfl: 2   cetirizine  (ZYRTEC ) 10 MG tablet, Take by mouth., Disp: , Rfl:    Cholecalciferol (VITAMIN D ) 50 MCG (2000 UT) tablet, Take 1 tablet (2,000 Units total) by mouth daily., Disp: 90 tablet, Rfl: 3   Cholecalciferol 50 MCG (2000 UT) TABS, Take 1 tablet by mouth daily., Disp: , Rfl:    ciclesonide (ALVESCO) 160 MCG/ACT inhaler, Inhale into the lungs., Disp: , Rfl:    clotrimazole  (LOTRIMIN ) 1 % cream, Apply 1 application topically 2 (two) times daily as needed (irritation)., Disp: , Rfl:    clotrimazole -betamethasone  (LOTRISONE ) cream, Apply 1 Application topically daily. Use as directed twice per day as needed, Disp: 30 g, Rfl: 0   cromolyn (OPTICROM) 4 % ophthalmic solution, Place 1 drop into both eyes 4 (four) times daily as needed., Disp: , Rfl:    cyanocobalamin   (VITAMIN B12) 1000 MCG tablet, Take 1 tablet (1,000 mcg total) by mouth daily., Disp: 90 tablet, Rfl: 3   docusate sodium (COLACE) 100 MG capsule, Take 100 mg by mouth daily as needed for mild constipation., Disp: , Rfl:    ferrous sulfate 324 MG TBEC, Take 324 mg by mouth. As needed, Disp: , Rfl:    fluticasone  (FLONASE ) 50 MCG/ACT nasal spray, Place into the nose., Disp: , Rfl:    Fluticasone -Umeclidin-Vilant (TRELEGY ELLIPTA ) 100-62.5-25 MCG/ACT AEPB, Inhale 1 puff then rinse mouth, once daily, Disp: 60 each, Rfl: 12   gabapentin  (NEURONTIN ) 100 MG capsule, Take 1 capsule (100 mg total) by mouth 3 (three) times daily., Disp: 90 capsule, Rfl: 5   hydrocortisone 2.5 % cream, SMARTSIG:Topical 1-2 Times Daily PRN, Disp: , Rfl:    ipratropium (ATROVENT ) 0.06 % nasal spray, Place 2 sprays into both nostrils 3 (three) times daily., Disp: 15 mL, Rfl: 5   levocetirizine (XYZAL  ALLERGY 24HR) 5 MG tablet, Take 1 tablet (5 mg total) by mouth every evening., Disp: 30 tablet, Rfl: 5   losartan  (COZAAR ) 100 MG tablet, Take 1 tablet (100 mg total) by mouth daily., Disp: 90 tablet, Rfl: 3   meloxicam  (MOBIC ) 15 MG tablet, 1 tab by mouth once daily as needed, Disp: 90 tablet, Rfl: 1   mirtazapine (REMERON) 7.5 MG tablet, Take by mouth., Disp: , Rfl:    nitroGLYCERIN  (NITROSTAT ) 0.4 MG SL tablet, Place 1 tablet (0.4 mg total) under the tongue  every 5 (five) minutes as needed for chest pain., Disp: 25 tablet, Rfl: 4   Omega-3 Fatty Acids (FISH OIL) 1000 MG CAPS, Take 1,000 mg by mouth 2 (two) times daily., Disp: , Rfl:    pantoprazole (PROTONIX) 40 MG tablet, TAKE ONE TABLET BY MOUTH ONCE EVERY DAY TO CONTROL STOMACH ACID, Disp: , Rfl:    pantoprazole (PROTONIX) 40 MG tablet, Take by mouth., Disp: , Rfl:    Spacer/Aero-Holding Chambers (AEROCHAMBER MV) inhaler, Use as instructed, Disp: 1 each, Rfl: 0   tadalafil (CIALIS) 20 MG tablet, Take 20 mg by mouth daily as needed for erectile dysfunction., Disp: , Rfl:     terazosin  (HYTRIN ) 2 MG capsule, Take 1 capsule (2 mg total) by mouth at bedtime., Disp: 90 capsule, Rfl: 3   Tiotropium Bromide-Olodaterol 2.5-2.5 MCG/ACT AERS, Inhale into the lungs., Disp: , Rfl:    traZODone (DESYREL) 50 MG tablet, Take 50 mg by mouth at bedtime as needed for sleep., Disp: , Rfl:    triamcinolone  (NASACORT ) 55 MCG/ACT AERO nasal inhaler, Place 2 sprays into the nose daily., Disp: 1 each, Rfl: 12  Past Medical History: Past Medical History:  Diagnosis Date   ABUSE, ALCOHOL, CONTINUOUS 08/08/2007   ALLERGIC RHINITIS 08/08/2007   ANXIETY 08/08/2007   Carpal tunnel syndrome    COPD 10/03/2009   DEPRESSION 08/08/2007   ERECTILE DYSFUNCTION 08/08/2007   GENITAL HERPES, HX OF 08/08/2007   GERD 08/08/2007   GLUCOSE INTOLERANCE 08/08/2007   HYPERLIPIDEMIA 08/08/2007   HYPERTENSION 08/08/2007   Impaired glucose tolerance 09/26/2011   Insomnia 05/18/2012   PAIN IN SOFT TISSUES OF LIMB 10/02/2009   Sleep apnea    OSA-uses CPAP nightly    Tobacco Use: Social History   Tobacco Use  Smoking Status Former   Current packs/day: 0.00   Average packs/day: 1.5 packs/day for 35.0 years (52.5 ttl pk-yrs)   Types: Cigarettes   Start date: 09/04/1974   Quit date: 09/04/2009   Years since quitting: 14.8   Passive exposure: Past  Smokeless Tobacco Never    Labs: Review Flowsheet  More data exists      Latest Ref Rng & Units 06/23/2022 07/31/2022 11/24/2022 06/18/2023 11/16/2023  Labs for ITP Cardiac and Pulmonary Rehab  Cholestrol 0 - 200 mg/dL 883  856  875  860  890   LDL (calc) 0 - 99 mg/dL 69  65  44  71  59   HDL-C >39.00 mg/dL 71.19  56.39  58.29  60.29  34.00   Trlycerides 0.0 - 149.0 mg/dL 06.9  828.9  807.9  858.9  81.0   Hemoglobin A1c 4.6 - 6.5 % 6.0  5.8  5.9  5.8  6.0     Capillary Blood Glucose: No results found for: GLUCAP   Pulmonary Assessment Scores:  Pulmonary Assessment Scores     Row Name 02/18/24 1001         ADL UCSD   ADL Phase Entry     SOB Score  total 34       CAT Score   CAT Score 14       mMRC Score   mMRC Score 1       UCSD: Self-administered rating of dyspnea associated with activities of daily living (ADLs) 6-point scale (0 = not at all to 5 = maximal or unable to do because of breathlessness)  Scoring Scores range from 0 to 120.  Minimally important difference is 5 units  CAT: CAT can identify the health impairment  of COPD patients and is better correlated with disease progression.  CAT has a scoring range of zero to 40. The CAT score is classified into four groups of low (less than 10), medium (10 - 20), high (21-30) and very high (31-40) based on the impact level of disease on health status. A CAT score over 10 suggests significant symptoms.  A worsening CAT score could be explained by an exacerbation, poor medication adherence, poor inhaler technique, or progression of COPD or comorbid conditions.  CAT MCID is 2 points  mMRC: mMRC (Modified Medical Research Council) Dyspnea Scale is used to assess the degree of baseline functional disability in patients of respiratory disease due to dyspnea. No minimal important difference is established. A decrease in score of 1 point or greater is considered a positive change.   Pulmonary Function Assessment:  Pulmonary Function Assessment - 02/18/24 0943       Breath   Bilateral Breath Sounds Clear;Decreased    Shortness of Breath Yes;Fear of Shortness of Breath;Limiting activity;Panic with Shortness of Breath          Exercise Target Goals: Exercise Program Goal: Individual exercise prescription set using results from initial 6 min walk test and THRR while considering  patient's activity barriers and safety.   Exercise Prescription Goal: Initial exercise prescription builds to 30-45 minutes a day of aerobic activity, 2-3 days per week.  Home exercise guidelines will be given to patient during program as part of exercise prescription that the participant will  acknowledge.  Activity Barriers & Risk Stratification:  Activity Barriers & Cardiac Risk Stratification - 02/18/24 0941       Activity Barriers & Cardiac Risk Stratification   Activity Barriers Deconditioning;Muscular Weakness;Shortness of Breath;Arthritis;Balance Concerns    Cardiac Risk Stratification Moderate          6 Minute Walk:  6 Minute Walk     Row Name 02/18/24 1027         6 Minute Walk   Phase Initial     Distance 1295 feet     Walk Time 6 minutes     # of Rest Breaks 0     MPH 2.45     METS 2.66     RPE 11     Perceived Dyspnea  1     VO2 Peak 9.32     Symptoms No     Resting HR 89 bpm     Resting BP 111/65     Resting Oxygen  Saturation  98 %     Exercise Oxygen  Saturation  during 6 min walk 95 %     Max Ex. HR 106 bpm     Max Ex. BP 128/70     2 Minute Post BP 102/60       Interval HR   1 Minute HR 97     2 Minute HR 105     3 Minute HR 101     4 Minute HR 101     5 Minute HR 106     6 Minute HR 106     2 Minute Post HR 83     Interval Heart Rate? Yes       Interval Oxygen    Interval Oxygen ? Yes     Baseline Oxygen  Saturation % 98 %     1 Minute Oxygen  Saturation % 97 %     1 Minute Liters of Oxygen  0 L     2 Minute Oxygen  Saturation % 95 %  2 Minute Liters of Oxygen  0 L     3 Minute Oxygen  Saturation % 97 %     3 Minute Liters of Oxygen  0 L     4 Minute Oxygen  Saturation % 95 %     4 Minute Liters of Oxygen  0 L     5 Minute Oxygen  Saturation % 966 %     5 Minute Liters of Oxygen  0 L     6 Minute Oxygen  Saturation % 96 %     6 Minute Liters of Oxygen  0 L     2 Minute Post Oxygen  Saturation % 98 %     2 Minute Post Liters of Oxygen  0 L        Oxygen  Initial Assessment:  Oxygen  Initial Assessment - 02/18/24 0943       Home Oxygen    Home Oxygen  Device None    Sleep Oxygen  Prescription CPAP    Home Exercise Oxygen  Prescription None    Home Resting Oxygen  Prescription None      Initial 6 min Walk   Oxygen  Used None       Program Oxygen  Prescription   Program Oxygen  Prescription None      Intervention   Short Term Goals To learn and understand importance of monitoring SPO2 with pulse oximeter and demonstrate accurate use of the pulse oximeter.;To learn and understand importance of maintaining oxygen  saturations>88%;To learn and demonstrate proper pursed lip breathing techniques or other breathing techniques. ;To learn and demonstrate proper use of respiratory medications    Long  Term Goals Maintenance of O2 saturations>88%;Compliance with respiratory medication;Verbalizes importance of monitoring SPO2 with pulse oximeter and return demonstration;Exhibits proper breathing techniques, such as pursed lip breathing or other method taught during program session;Demonstrates proper use of MDI's          Oxygen  Re-Evaluation:  Oxygen  Re-Evaluation     Row Name 02/23/24 0849 03/24/24 0939 04/21/24 0823 05/17/24 0957 06/14/24 1101     Program Oxygen  Prescription   Program Oxygen  Prescription None None None None None     Home Oxygen    Home Oxygen  Device None None None None None   Sleep Oxygen  Prescription CPAP CPAP CPAP CPAP CPAP   Home Exercise Oxygen  Prescription None None None None None   Home Resting Oxygen  Prescription None None None None None     Goals/Expected Outcomes   Short Term Goals To learn and understand importance of monitoring SPO2 with pulse oximeter and demonstrate accurate use of the pulse oximeter.;To learn and understand importance of maintaining oxygen  saturations>88%;To learn and demonstrate proper pursed lip breathing techniques or other breathing techniques. ;To learn and demonstrate proper use of respiratory medications To learn and understand importance of monitoring SPO2 with pulse oximeter and demonstrate accurate use of the pulse oximeter.;To learn and understand importance of maintaining oxygen  saturations>88%;To learn and demonstrate proper pursed lip breathing techniques or other  breathing techniques. ;To learn and demonstrate proper use of respiratory medications To learn and understand importance of monitoring SPO2 with pulse oximeter and demonstrate accurate use of the pulse oximeter.;To learn and understand importance of maintaining oxygen  saturations>88%;To learn and demonstrate proper pursed lip breathing techniques or other breathing techniques. ;To learn and demonstrate proper use of respiratory medications To learn and understand importance of monitoring SPO2 with pulse oximeter and demonstrate accurate use of the pulse oximeter.;To learn and understand importance of maintaining oxygen  saturations>88%;To learn and demonstrate proper pursed lip breathing techniques or other breathing techniques. ;To learn and demonstrate proper use  of respiratory medications To learn and understand importance of monitoring SPO2 with pulse oximeter and demonstrate accurate use of the pulse oximeter.;To learn and understand importance of maintaining oxygen  saturations>88%;To learn and demonstrate proper pursed lip breathing techniques or other breathing techniques. ;To learn and demonstrate proper use of respiratory medications   Long  Term Goals Maintenance of O2 saturations>88%;Compliance with respiratory medication;Verbalizes importance of monitoring SPO2 with pulse oximeter and return demonstration;Exhibits proper breathing techniques, such as pursed lip breathing or other method taught during program session;Demonstrates proper use of MDI's Maintenance of O2 saturations>88%;Compliance with respiratory medication;Verbalizes importance of monitoring SPO2 with pulse oximeter and return demonstration;Exhibits proper breathing techniques, such as pursed lip breathing or other method taught during program session;Demonstrates proper use of MDI's Maintenance of O2 saturations>88%;Compliance with respiratory medication;Verbalizes importance of monitoring SPO2 with pulse oximeter and return  demonstration;Exhibits proper breathing techniques, such as pursed lip breathing or other method taught during program session;Demonstrates proper use of MDI's Maintenance of O2 saturations>88%;Compliance with respiratory medication;Verbalizes importance of monitoring SPO2 with pulse oximeter and return demonstration;Exhibits proper breathing techniques, such as pursed lip breathing or other method taught during program session;Demonstrates proper use of MDI's Maintenance of O2 saturations>88%;Compliance with respiratory medication;Verbalizes importance of monitoring SPO2 with pulse oximeter and return demonstration;Exhibits proper breathing techniques, such as pursed lip breathing or other method taught during program session;Demonstrates proper use of MDI's   Goals/Expected Outcomes Compliance and understanding of oxygen  saturation monitoring and breathing techniques to decrease shortness of breath. Compliance and understanding of oxygen  saturation monitoring and breathing techniques to decrease shortness of breath. Compliance and understanding of oxygen  saturation monitoring and breathing techniques to decrease shortness of breath. Compliance and understanding of oxygen  saturation monitoring and breathing techniques to decrease shortness of breath. Compliance and understanding of oxygen  saturation monitoring and breathing techniques to decrease shortness of breath.      Oxygen  Discharge (Final Oxygen  Re-Evaluation):  Oxygen  Re-Evaluation - 06/14/24 1101       Program Oxygen  Prescription   Program Oxygen  Prescription None      Home Oxygen    Home Oxygen  Device None    Sleep Oxygen  Prescription CPAP    Home Exercise Oxygen  Prescription None    Home Resting Oxygen  Prescription None      Goals/Expected Outcomes   Short Term Goals To learn and understand importance of monitoring SPO2 with pulse oximeter and demonstrate accurate use of the pulse oximeter.;To learn and understand importance of  maintaining oxygen  saturations>88%;To learn and demonstrate proper pursed lip breathing techniques or other breathing techniques. ;To learn and demonstrate proper use of respiratory medications    Long  Term Goals Maintenance of O2 saturations>88%;Compliance with respiratory medication;Verbalizes importance of monitoring SPO2 with pulse oximeter and return demonstration;Exhibits proper breathing techniques, such as pursed lip breathing or other method taught during program session;Demonstrates proper use of MDI's    Goals/Expected Outcomes Compliance and understanding of oxygen  saturation monitoring and breathing techniques to decrease shortness of breath.          Initial Exercise Prescription:  Initial Exercise Prescription - 02/18/24 1000       Date of Initial Exercise RX and Referring Provider   Date 02/18/24    Referring Provider Levander Staff   Expected Discharge Date 05/16/24      Treadmill   MPH 2.5    Grade 0    Minutes 15    METs 2.91      Recumbant Elliptical   Level 2    RPM  253    Watts 50    Minutes 15    METs 2.5      Prescription Details   Frequency (times per week) 2    Duration Progress to 30 minutes of continuous aerobic without signs/symptoms of physical distress      Intensity   THRR 40-80% of Max Heartrate 57-114    Ratings of Perceived Exertion 11-13    Perceived Dyspnea 0-4      Progression   Progression Continue to progress workloads to maintain intensity without signs/symptoms of physical distress.      Resistance Training   Training Prescription Yes    Weight blue bands    Reps 10-15          Perform Capillary Blood Glucose checks as needed.  Exercise Prescription Changes:   Exercise Prescription Changes     Row Name 02/29/24 1200 03/14/24 1100 03/23/24 1146 04/11/24 1200 04/25/24 1200     Response to Exercise   Blood Pressure (Admit) 104/58 112/62 120/60 126/84 124/64   Blood Pressure (Exercise) 128/56 122/62 -- 96/50 156/80    Blood Pressure (Exit) 96/58 130/60 98/58 104/56 110/78   Heart Rate (Admit) 78 bpm 70 bpm 70 bpm 84 bpm 75 bpm   Heart Rate (Exercise) 116 bpm 105 bpm 107 bpm 120 bpm 112 bpm   Heart Rate (Exit) 92 bpm 84 bpm 87 bpm 98 bpm 83 bpm   Oxygen  Saturation (Admit) 97 % 96 % 96 % 97 % 96 %   Oxygen  Saturation (Exercise) 95 % 94 % 95 % 95 % 94 %   Oxygen  Saturation (Exit) 94 % 96 % 96 % 96 % 97 %   Rating of Perceived Exertion (Exercise) 13 10 9 11 11    Perceived Dyspnea (Exercise) 3 1 1 2 1    Duration Continue with 30 min of aerobic exercise without signs/symptoms of physical distress. Continue with 30 min of aerobic exercise without signs/symptoms of physical distress. Continue with 30 min of aerobic exercise without signs/symptoms of physical distress. Continue with 30 min of aerobic exercise without signs/symptoms of physical distress. Continue with 30 min of aerobic exercise without signs/symptoms of physical distress.   Intensity THRR unchanged THRR unchanged THRR unchanged THRR unchanged THRR unchanged     Progression   Progression -- -- Continue to progress workloads to maintain intensity without signs/symptoms of physical distress. Continue to progress workloads to maintain intensity without signs/symptoms of physical distress. Continue to progress workloads to maintain intensity without signs/symptoms of physical distress.   Average METs -- -- 5 4.8 5.5     Resistance Training   Training Prescription Yes Yes Yes Yes Yes   Weight blue bands blue bands blue bands blue bands blue bands   Reps 10-15 10-15 10-15 10-15 10-15   Time 10 Minutes 10 Minutes 10 Minutes 10 Minutes 10 Minutes     Treadmill   MPH 2.5 2.5 2.7 3 3    Grade 1 1.5 2 2 2    Minutes 15 15 15 15 15    METs 3.1 3.41 3.7 4 4      Recumbant Elliptical   Level 3 3 4 4 4    RPM 64 68 68 67 74   Watts 92 -- -- -- --   Minutes 15 15 15 15 15    METs 4.3 5.1 5 4.8 5.5    Row Name 05/09/24 1200 05/18/24 1201 06/06/24 1200  06/20/24 1200       Response to Exercise   Blood Pressure (Admit)  112/70 122/62 112/62 110/58    Blood Pressure (Exercise) 128/66 -- 132/70 118/56    Blood Pressure (Exit) 104/61 120/60 96/68 108/62    Heart Rate (Admit) 71 bpm 74 bpm 68 bpm 82 bpm    Heart Rate (Exercise) 106 bpm 116 bpm 104 bpm 119 bpm    Heart Rate (Exit) 90 bpm 93 bpm 82 bpm 89 bpm    Oxygen  Saturation (Admit) 98 % 96 % 98 % 97 %    Oxygen  Saturation (Exercise) 93 % 95 % 95 % 95 %    Oxygen  Saturation (Exit) 95 % 96 % 97 % 96 %    Rating of Perceived Exertion (Exercise) 9 11 10 13     Perceived Dyspnea (Exercise) 1 1 1 2     Duration Continue with 30 min of aerobic exercise without signs/symptoms of physical distress. Continue with 30 min of aerobic exercise without signs/symptoms of physical distress. Continue with 30 min of aerobic exercise without signs/symptoms of physical distress. Continue with 30 min of aerobic exercise without signs/symptoms of physical distress.    Intensity THRR unchanged THRR unchanged THRR unchanged THRR unchanged      Progression   Progression Continue to progress workloads to maintain intensity without signs/symptoms of physical distress. Continue to progress workloads to maintain intensity without signs/symptoms of physical distress. Continue to progress workloads to maintain intensity without signs/symptoms of physical distress. Continue to progress workloads to maintain intensity without signs/symptoms of physical distress.      Resistance Training   Training Prescription Yes Yes Yes Yes    Weight blue bands blue bands blue bands blue bands    Reps 10-15 10-15 10-15 10-15    Time 10 Minutes 10 Minutes 10 Minutes 10 Minutes      Treadmill   MPH 3 3 3  3.2    Grade 3 3 3 3     Minutes 15 15 15 15     METs 4.3 4.3 4.3 4.7      Recumbant Elliptical   Level 5 5 6 6     RPM 67 67 61 --    Watts 110 -- 97 --    Minutes 15 15 15 15     METs 5.3 5.1 5 4.7       Exercise Comments:    Exercise Comments     Row Name 02/22/24 1528 03/23/24 1528         Exercise Comments Pt completed first day of exercise. [place] averaged 2.7 METs at 2.4 mph on the treadmill and 5.4 METs at level 2 on the recumbent elliptical. He performed the warmup and cooldown standing without limitations. Discussed METs. Completed home exercise plan Lelan is not currently exercising outside of rehab. He has access to the [facility]. I encouraged him to attend the [facility] and walk on the treadmill or ride the bike. I recommended attending the [employer] 1 non-rehab day/wk for 30 min/day. He agreed with my recommendations. Jaegar seems motivated to exercise outside of rehab.         Exercise Goals and Review:   Exercise Goals     Row Name 02/18/24 [phone]             Exercise Goals   Increase Physical Activity Yes       Intervention Provide advice, education, support and counseling about physical activity/exercise needs.;Develop an individualized exercise prescription for aerobic and resistive training based on initial evaluation findings, risk stratification, comorbidities and participant's personal goals.       Expected Outcomes Short  Term: Attend rehab on a regular basis to increase amount of physical activity.;Long Term: Add in home exercise to make exercise part of routine and to increase amount of physical activity.;Long Term: Exercising regularly at least 3-5 days a week.       Increase Strength and Stamina Yes       Intervention Provide advice, education, support and counseling about physical activity/exercise needs.;Develop an individualized exercise prescription for aerobic and resistive training based on initial evaluation findings, risk stratification, comorbidities and participant's personal goals.       Expected Outcomes Short Term: Increase workloads from initial exercise prescription for resistance, speed, and METs.;Short Term: Perform resistance training exercises routinely during rehab and add in  resistance training at home;Long Term: Improve cardiorespiratory fitness, muscular endurance and strength as measured by increased METs and functional capacity ( )       Able to understand and use rate of perceived exertion (RPE) scale Yes       Intervention Provide education and explanation on how to use RPE scale       Expected Outcomes Short Term: Able to use RPE daily in rehab to express subjective intensity level;Long Term:  Able to use RPE to guide intensity level when exercising independently       Able to understand and use Dyspnea scale Yes       Intervention Provide education and explanation on how to use Dyspnea scale       Expected Outcomes Short Term: Able to use Dyspnea scale daily in rehab to express subjective sense of shortness of breath during exertion;Long Term: Able to use Dyspnea scale to guide intensity level when exercising independently       Knowledge and understanding of Target Heart Rate Range (THRR) Yes       Intervention Provide education and explanation of THRR including how the numbers were predicted and where they are located for reference       Expected Outcomes Short Term: Able to state/look up THRR;Long Term: Able to use THRR to govern intensity when exercising independently;Short Term: Able to use daily as guideline for intensity in rehab       Understanding of Exercise Prescription Yes       Intervention Provide education, explanation, and written materials on patient's individual exercise prescription       Expected Outcomes Short Term: Able to explain program exercise prescription;Long Term: Able to explain home exercise prescription to exercise independently          Exercise Goals Re-Evaluation :  Exercise Goals Re-Evaluation     Row Name 02/23/24 0842 03/24/24 0937 04/21/24 0820 05/17/24 0955 06/14/24 1059     Exercise Goal Re-Evaluation   Exercise Goals Review Increase Physical Activity;Able to understand and use Dyspnea scale;Understanding of  Exercise Prescription;Increase Strength and Stamina;Knowledge and understanding of Target Heart Rate Range (THRR);Able to understand and use rate of perceived exertion (RPE) scale Increase Physical Activity;Able to understand and use Dyspnea scale;Understanding of Exercise Prescription;Increase Strength and Stamina;Knowledge and understanding of Target Heart Rate Range (THRR);Able to understand and use rate of perceived exertion (RPE) scale Increase Physical Activity;Able to understand and use Dyspnea scale;Understanding of Exercise Prescription;Increase Strength and Stamina;Knowledge and understanding of Target Heart Rate Range (THRR);Able to understand and use rate of perceived exertion (RPE) scale Increase Physical Activity;Able to understand and use Dyspnea scale;Understanding of Exercise Prescription;Increase Strength and Stamina;Knowledge and understanding of Target Heart Rate Range (THRR);Able to understand and use rate of perceived exertion (RPE) scale Increase Physical Activity;Able  to understand and use Dyspnea scale;Understanding of Exercise Prescription;Increase Strength and Stamina;Knowledge and understanding of Target Heart Rate Range (THRR);Able to understand and use rate of perceived exertion (RPE) scale   Comments Mithcell has completed 1 exercise session. He averages 2.7 METs at 2.4 mph on the treadmill and 5.4 METs at level 2 on the recumbent elliptical. Xyler performs the warmup and cooldown standing without limitations. It is too soon to notate any discernable progressions. Will continue to monitor and progress as able. Maximino has completed 10 exercise sessions. He averages 3.7 METs at 2.7 mph and 2% incline on the treadmill and 5 METs at level 4 on the recumbent elliptical. Josten performs the warmup and cooldown standing without limitations. He has increased his speed and incline on the treadmill and level on the Nustep. He tolerates progressions well. Mansur does need encouragement to  increase his levels. We have recently discussed home exercise as I encouraged Torrez to attend the [facility] weekly. Will continue to monitor and progress as able. Diamante has completed 17 exercise sessions. He averages 4 METs at 3 mph and 2% incline on the treadmill and 5.3 METs at level 4 on the recumbent elliptical. Kyian performs the warmup and cooldown standing without limitations. He has increased his speed on the treadmill as METs have slightly increased. His level on the recumbent elliptical has remained the same, although speed has increased. We have discussed home exercise as I encouraged Cavion to exercise at the [facility]. Will continue to monitor and progress as able. Wilbon has completed 23exercise sessions. Kijana exercises for 15 min on the treadmill and recumbent elliptical. He averages 4 METs at 3 mph and 2% incline on the treadmill and 5 METs at level 5 on the recumbent elliptical. Jahlon performs the warmup and cooldown standing without limitations. He has increased level on the recumbent elliptical as METs remain relatively the same. Will continue to monitor and progress as able. Adolphe has completed 30 exercise sessions. Yussuf exercises for 15 min on the treadmill and recumbent elliptical. He averages 4.3 METs at 3 mph and 3% incline on the treadmill and 6.1 METs at level 6 on the recumbent elliptical. Tristian performs the warmup and cooldown standing without limitations. He continues to increase his level on the recumbent level. METs are also increasing as well. Will continue to monitor and progress as able.   Expected Outcomes Through exercise at rehab and home, the patient will decrease shortness of breath with daily activities and feel confident carrying out an exercise regimn at home. Through exercise at rehab and home, the patient will decrease shortness of breath with daily activities and feel confident carrying out an exercise regimn at home. Through exercise at rehab and home, the patient will  decrease shortness of breath with daily activities and feel confident carrying out an exercise regimn at home. Through exercise at rehab and home, the patient will decrease shortness of breath with daily activities and feel confident carrying out an exercise regimn at home. Through exercise at rehab and home, the patient will decrease shortness of breath with daily activities and feel confident carrying out an exercise regimn at home.      Discharge Exercise Prescription (Final Exercise Prescription Changes):  Exercise Prescription Changes - 06/20/24 1200       Response to Exercise   Blood Pressure (Admit) 110/58    Blood Pressure (Exercise) 118/56    Blood Pressure (Exit) 108/62    Heart Rate (Admit) 82 bpm    Heart Rate (  Exercise) 119 bpm    Heart Rate (Exit) 89 bpm    Oxygen  Saturation (Admit) 97 %    Oxygen  Saturation (Exercise) 95 %    Oxygen  Saturation (Exit) 96 %    Rating of Perceived Exertion (Exercise) 13    Perceived Dyspnea (Exercise) 2    Duration Continue with 30 min of aerobic exercise without signs/symptoms of physical distress.    Intensity THRR unchanged      Progression   Progression Continue to progress workloads to maintain intensity without signs/symptoms of physical distress.      Resistance Training   Training Prescription Yes    Weight blue bands    Reps 10-15    Time 10 Minutes      Treadmill   MPH 3.2    Grade 3    Minutes 15    METs 4.7      Recumbant Elliptical   Level 6    Minutes 15    METs 4.7          Nutrition:  Target Goals: Understanding of nutrition guidelines, daily intake of sodium 1500mg , cholesterol 200mg , calories 30% from fat and 7% or less from saturated fats, daily to have 5 or more servings of fruits and vegetables.  Biometrics:    Nutrition Therapy Plan and Nutrition Goals:  Nutrition Therapy & Goals - 06/15/24 1505       Nutrition Therapy   Diet General Healthy Diet    Drug/Food Interactions Statins/Certain  Fruits      Personal Nutrition Goals   Nutrition Goal Patient to identify strategies for weight gain of 0.5-2.0# per week.   goal not met.   Personal Goal #2 --   goal in progress.   Comments Goal not met. Alvino has medical history of HTN, CAD, OSA, dementia, CKD3, hyperlipidemia, alcohol abuse. He is motivated to gain ~5# to 170#: Patient is down 2.8# since starting with our program; though weight has been stable over the last ~30 days. Eating frequency remains variable and he does skip meals. We have discussed strategies for weight gain including increasing eating frequency, calorie density, etc. He continues to complete group therapy weekly through the [facility]. Salmaan will continue to benefit from participation in pulmonary rehab for nutrition, exercise, and lifestyle modification.      Intervention Plan   Intervention Prescribe, educate and counsel regarding individualized specific dietary modifications aiming towards targeted core components such as weight, hypertension, lipid management, diabetes, heart failure and other comorbidities.;Nutrition handout(s) given to patient.    Expected Outcomes Short Term Goal: Understand basic principles of dietary content, such as calories, fat, sodium, cholesterol and nutrients.;Long Term Goal: Adherence to prescribed nutrition plan.          Nutrition Assessments:  MEDIFICTS Score Key: >=70 Need to make dietary changes  40-70 Heart Healthy Diet <= 40 Therapeutic Level Cholesterol Diet  Flowsheet Row PULMONARY REHAB CHRONIC OBSTRUCTIVE PULMONARY DISEASE from 07/17/2021 in [facility] Heart, Vascular, & Lung Health  Picture Your Plate Total Score on Discharge 53   Picture Your Plate Scores: <59 Unhealthy dietary pattern with much room for improvement. 41-50 Dietary pattern unlikely to meet recommendations for good health and room for improvement. 51-60 More healthful dietary pattern, with some room for improvement.  >60  Healthy dietary pattern, although there may be some specific behaviors that could be improved.    Nutrition Goals Re-Evaluation:  Nutrition Goals Re-Evaluation     Row Name 02/24/24 1102 03/23/24 1205 04/20/24  1012 05/23/24 1047 06/15/24 1505     Goals   Current Weight 163 lb 12.8 oz (74.3 kg) 164 lb 10.9 oz (74.7 kg) 162 lb 4.1 oz (73.6 kg) 160 lb 7.9 oz (72.8 kg) 160 lb 15 oz (73 kg)   Comment Thiamine WNL, HDL 34, LDL 59,A1C 6.0, GFR 49, Cr 1.38 Thiamine WNL, HDL 34, LDL 59,A1C 6.0, GFR 49, Cr 1.38 Thiamine WNL, HDL 34, LDL 59,A1C 6.0, GFR 49, Cr 1.38 Thiamine WNL, HDL 34, LDL 59,A1C 6.0, GFR 49, Cr 1.38 Thiamine WNL, HDL 34, LDL 59,A1C 6.0, GFR 49, Cr 1.38   Expected Outcome Bowie has medical history of HTN, CAD, OSA, dementia, CKD3, hyperlipidemia, alcohol abuse. He is motivated to gain ~5# to 170#. We discussed strategies for weight gain including increasing eating frequency, etc. He continues to complete group therapy weekly through the [facility]. Anes will continue to benefit from participation in pulmonary rehab for nutrition, exercise, and lifestyle modification. Goal not met at this time. Kazmir has medical history of HTN, CAD, OSA, dementia, CKD3, hyperlipidemia, alcohol abuse. He is motivated to gain ~5# to 170#. We discussed strategies for weight gain including increasing eating frequency, etc. He has maintained his weight since starting with our program. He continues to complete group therapy weekly through the [facility]. Ashley will continue to benefit from participation in pulmonary rehab for nutrition, exercise, and lifestyle modification. Goal not met. Oaklen has medical history of HTN, CAD, OSA, dementia, CKD3, hyperlipidemia, alcohol abuse. He is motivated to gain ~5# to 170#: patient is down 1.5# since starting with our program. BMI is appropriate for age at this time; will continue to monitor weight. We have discussed strategies for weight gain including increasing eating frequency, calorie  density etc. He continues to complete group therapy weekly through the [facility]. Orenthal will continue to benefit from participation in pulmonary rehab for nutrition, exercise, and lifestyle modification. Goal not met. Katai has medical history of HTN, CAD, OSA, dementia, CKD3, hyperlipidemia, alcohol abuse. He is motivated to gain ~5# to 170#: Patient is down 3.3# since starting with our program; will continue to monitor weight. Eating frequency remains variable and he does skip meals. We have discussed strategies for weight gain including increasing eating frequency, calorie density, etc. He continues to complete group therapy weekly through the [facility]. Samuell will continue to benefit from participation in pulmonary rehab for nutrition, exercise, and lifestyle modification. Goal not met. Kavon has medical history of HTN, CAD, OSA, dementia, CKD3, hyperlipidemia, alcohol abuse. He is motivated to gain ~5# to 170#: Patient is down 2.8# since starting with our program; though weight has been stable over the last ~30 days. Eating frequency remains variable and he does skip meals. We have discussed strategies for weight gain including increasing eating frequency, calorie density, etc. He continues to complete group therapy weekly through the [facility]. Olin will continue to benefit from participation in pulmonary rehab for nutrition, exercise, and lifestyle modification.      Nutrition Goals Discharge (Final Nutrition Goals Re-Evaluation):  Nutrition Goals Re-Evaluation - 06/15/24 1505       Goals   Current Weight 160 lb 15 oz (73 kg)    Comment Thiamine WNL, HDL 34, LDL 59,A1C 6.0, GFR 49, Cr 1.38    Expected Outcome Goal not met. Catalino has medical history of HTN, CAD, OSA, dementia, CKD3, hyperlipidemia, alcohol abuse. He is motivated to gain ~5# to 170#: Patient is down 2.8# since starting with our program; though weight has been stable over the  last ~30 days. Eating frequency remains variable and he does skip meals.  We have discussed strategies for weight gain including increasing eating frequency, calorie density, etc. He continues to complete group therapy weekly through the [facility]. Conal will continue to benefit from participation in pulmonary rehab for nutrition, exercise, and lifestyle modification.          Psychosocial: Target Goals: Acknowledge presence or absence of significant depression and/or stress, maximize coping skills, provide positive support system. Participant is able to verbalize types and ability to use techniques and skills needed for reducing stress and depression.  Initial Review & Psychosocial Screening:  Initial Psych Review & Screening - 02/18/24 0934       Initial Review   Current issues with None Identified      Family Dynamics   Good Support System? Yes      Barriers   Psychosocial barriers to participate in program There are no identifiable barriers or psychosocial needs.      Screening Interventions   Interventions Encouraged to exercise          Quality of Life Scores:  Scores of 19 and below usually indicate a poorer quality of life in these areas.  A difference of  2-3 points is a clinically meaningful difference.  A difference of 2-3 points in the total score of the Quality of Life Index has been associated with significant improvement in overall quality of life, self-image, physical symptoms, and general health in studies assessing change in quality of life.  PHQ-9: Review Flowsheet  More data exists      03/23/2024 02/18/2024 12/22/2023 11/16/2023 06/21/2023  Depression screen PHQ 2/9  Decreased Interest 0 0 0 0 1  Down, Depressed, Hopeless 0 0 0 0 0  PHQ - 2 Score 0 0 0 0 1  Altered sleeping 0 1 - - -  Tired, decreased energy 0 1 - - -  Change in appetite 0 1 - - -  Feeling bad or failure about yourself  0 0 - - -  Trouble concentrating 0 0 - - -  Moving slowly or fidgety/restless 3 0 - - -  Suicidal thoughts 0 0 - - -  PHQ-9 Score 3 3 - - -   Difficult doing work/chores Not difficult at all Somewhat difficult - - -   Interpretation of Total Score  Total Score Depression Severity:  1-4 = Minimal depression, 5-9 = Mild depression, 10-14 = Moderate depression, 15-19 = Moderately severe depression, 20-27 = Severe depression   Psychosocial Evaluation and Intervention:  Psychosocial Evaluation - 02/18/24 1002       Psychosocial Evaluation & Interventions   Interventions Encouraged to exercise with the program and follow exercise prescription    Comments No psychosocial concerns identified at this time    Expected Outcomes For Kashmere to participate in PR free of any psychosocial barriers or concerns    Continue Psychosocial Services  No Follow up required          Psychosocial Re-Evaluation:  Psychosocial Re-Evaluation     Row Name 02/21/24 0926 03/22/24 1110 04/19/24 1124 05/15/24 0952 05/22/24 0818     Psychosocial Re-Evaluation   Current issues with None Identified None Identified None Identified None Identified None Identified   Comments Sabas is scheduled to start PR on 02/22/24. No new psychosocial barriers or concerns since orientation on 02/18/24. Tymel denies any new psychosocial barriers or concerns at this time. He continues to attend weekly therapy through the [facility]. Evelio is doing  great in the program. He enjoys coming to class and has even made some friends. He denies any new barriers or concerns at this time. Cylus continues to deny any psychosocial barriers or concerns at this time. Monthly Re-eval: Jodie continues to deny any psychosocial barriers or concerns at this time. He denies any need for additional resources or referrals.   Expected Outcomes For Ridley to participate in PR free of any psychosocial barriers or concerns For Marsalis to participate in PR free of any psychosocial barriers or concerns For Jishnu to participate in PR free of any psychosocial barriers or concerns For Jahmel to participate in PR free  of any psychosocial barriers or concerns For Christian to continue to participate in PR free of any psychosocial barriers or concerns   Interventions Encouraged to attend Pulmonary Rehabilitation for the exercise Encouraged to attend Pulmonary Rehabilitation for the exercise Encouraged to attend Pulmonary Rehabilitation for the exercise Encouraged to attend Pulmonary Rehabilitation for the exercise Encouraged to attend Pulmonary Rehabilitation for the exercise   Continue Psychosocial Services  No Follow up required No Follow up required No Follow up required No Follow up required No Follow up required    Row Name 06/14/24 9062             Psychosocial Re-Evaluation   Current issues with None Identified       Comments Ottis continues to deny any psychosocial barriers or concerns at this time.       Expected Outcomes For Yannick to continue to participate in PR free of any psychosocial barriers or concerns       Interventions Encouraged to attend Pulmonary Rehabilitation for the exercise       Continue Psychosocial Services  No Follow up required          Psychosocial Discharge (Final Psychosocial Re-Evaluation):  Psychosocial Re-Evaluation - 06/14/24 0937       Psychosocial Re-Evaluation   Current issues with None Identified    Comments Cedarius continues to deny any psychosocial barriers or concerns at this time.    Expected Outcomes For Romuald to continue to participate in PR free of any psychosocial barriers or concerns    Interventions Encouraged to attend Pulmonary Rehabilitation for the exercise    Continue Psychosocial Services  No Follow up required          Education: Education Goals: Education classes will be provided on a weekly basis, covering required topics. Participant will state understanding/return demonstration of topics presented.  Learning Barriers/Preferences:  Learning Barriers/Preferences - 02/18/24 0936       Learning Barriers/Preferences   Learning  Barriers Sight   wears glasses   Learning Preferences Audio;Computer/Internet;Group Instruction;Individual Instruction;Pictoral;Skilled Demonstration;Written Material          Education Topics: Know Your Numbers Group instruction that is supported by a PowerPoint presentation. Instructor discusses importance of knowing and understanding resting, exercise, and post-exercise oxygen  saturation, heart rate, and blood pressure. Oxygen  saturation, heart rate, blood pressure, rating of perceived exertion, and dyspnea are reviewed along with a normal range for these values.  Flowsheet Row PULMONARY REHAB CHRONIC OBSTRUCTIVE PULMONARY DISEASE from 05/11/2024 in [facility] for Heart, Vascular, & Lung Health  Date 05/11/24  Educator EP  Instruction Review Code 1- Verbalizes Understanding    Exercise for the Pulmonary Patient Group instruction that is supported by a PowerPoint presentation. Instructor discusses benefits of exercise, core components of exercise, frequency, duration, and intensity of an exercise routine, importance of utilizing pulse oximetry  during exercise, safety while exercising, and options of places to exercise outside of rehab.  Flowsheet Row PULMONARY REHAB CHRONIC OBSTRUCTIVE PULMONARY DISEASE from 05/04/2024 in [facility] for Heart, Vascular, & Lung Health  Date 05/04/24  Educator EP  Instruction Review Code 1- Verbalizes Understanding    MET Level  Group instruction provided by PowerPoint, verbal discussion, and written material to support subject matter. Instructor reviews what METs are and how to increase METs.  Flowsheet Row PULMONARY REHAB CHRONIC OBSTRUCTIVE PULMONARY DISEASE from 04/06/2024 in [facility] for Heart, Vascular, & Lung Health  Date 04/06/24  Educator EP  Instruction Review Code 1- Verbalizes Understanding    Pulmonary Medications Verbally interactive group education provided by  instructor with focus on inhaled medications and proper administration. Flowsheet Row PULMONARY REHAB CHRONIC OBSTRUCTIVE PULMONARY DISEASE from 04/27/2024 in [facility] for Heart, Vascular, & Lung Health  Date 04/27/24  Educator RT  Instruction Review Code 1- Verbalizes Understanding    Anatomy and Physiology of the Respiratory System Group instruction provided by PowerPoint, verbal discussion, and written material to support subject matter. Instructor reviews respiratory cycle and anatomical components of the respiratory system and their functions. Instructor also reviews differences in obstructive and restrictive respiratory diseases with examples of each.  Flowsheet Row PULMONARY REHAB CHRONIC OBSTRUCTIVE PULMONARY DISEASE from 04/20/2024 in [facility] for Heart, Vascular, & Lung Health  Date 04/20/24  Educator RT  Instruction Review Code 1- Verbalizes Understanding    Oxygen  Safety Group instruction provided by PowerPoint, verbal discussion, and written material to support subject matter. There is an overview of "What is Oxygen " and "Why do we need it".  Instructor also reviews how to create a safe environment for oxygen  use, the importance of using oxygen  as prescribed, and the risks of noncompliance. There is a brief discussion on traveling with oxygen  and resources the patient may utilize. Flowsheet Row PULMONARY REHAB CHRONIC OBSTRUCTIVE PULMONARY DISEASE from 05/18/2024 in [facility] for Heart, Vascular, & Lung Health  Date 05/18/24  Educator RN  Instruction Review Code 1- Verbalizes Understanding    Oxygen  Use Group instruction provided by PowerPoint, verbal discussion, and written material to discuss how supplemental oxygen  is prescribed and different types of oxygen  supply systems. Resources for more information are provided.  Flowsheet Row PULMONARY REHAB CHRONIC OBSTRUCTIVE PULMONARY DISEASE from 03/02/2024  in [facility] for Heart, Vascular, & Lung Health  Date 03/02/24  Educator RT  Instruction Review Code 1- Verbalizes Understanding    Breathing Techniques Group instruction that is supported by demonstration and informational handouts. Instructor discusses the benefits of pursed lip and diaphragmatic breathing and detailed demonstration on how to perform both.  Flowsheet Row PULMONARY REHAB CHRONIC OBSTRUCTIVE PULMONARY DISEASE from 06/01/2024 in [facility] for Heart, Vascular, & Lung Health  Date 06/01/24  Educator RN  Instruction Review Code 1- Verbalizes Understanding     Risk Factor Reduction Group instruction that is supported by a PowerPoint presentation. Instructor discusses the definition of a risk factor, different risk factors for pulmonary disease, and how the heart and lungs work together. Flowsheet Row PULMONARY REHAB CHRONIC OBSTRUCTIVE PULMONARY DISEASE from 03/30/2024 in [facility] for Heart, Vascular, & Lung Health  Date 03/30/24  Educator EP  Instruction Review Code 1- Verbalizes Understanding    Pulmonary Diseases Group instruction provided by PowerPoint, verbal discussion, and written material to support subject  matter. Instructor gives an overview of the different type of pulmonary diseases. There is also a discussion on risk factors and symptoms as well as ways to manage the diseases. Flowsheet Row PULMONARY REHAB CHRONIC OBSTRUCTIVE PULMONARY DISEASE from 04/13/2024 in [facility] for Heart, Vascular, & Lung Health  Date 04/13/24  Educator RT  Instruction Review Code 1- Verbalizes Understanding    Stress and [employer] Conservation Group instruction provided by PowerPoint, verbal discussion, and written material to support subject matter. Instructor gives an overview of stress and the impact it can have on the body. Instructor also reviews ways to reduce stress. There is  also a discussion on energy conservation and ways to conserve energy throughout the day. Flowsheet Row PULMONARY REHAB CHRONIC OBSTRUCTIVE PULMONARY DISEASE from 06/08/2024 in [facility] for Heart, Vascular, & Lung Health  Date 06/08/24  Educator RN  Instruction Review Code 1- Verbalizes Understanding    Warning Signs and Symptoms Group instruction provided by PowerPoint, verbal discussion, and written material to support subject matter. Instructor reviews warning signs and symptoms of stroke, heart attack, cold and flu. Instructor also reviews ways to prevent the spread of infection. Flowsheet Row PULMONARY REHAB CHRONIC OBSTRUCTIVE PULMONARY DISEASE from 06/15/2024 in [facility] for Heart, Vascular, & Lung Health  Date 06/15/24  Educator RN  Instruction Review Code 1- Verbalizes Understanding    Other Education Group or individual verbal, written, or video instructions that support the educational goals of the pulmonary rehab program. Flowsheet Row PULMONARY REHAB CHRONIC OBSTRUCTIVE PULMONARY DISEASE from 07/17/2021 in [facility] for Heart, Vascular, & Lung Health  Date 07/17/21  Educator Handout  [How to beat a sedentary lifestyle]     Knowledge Questionnaire Score:  Knowledge Questionnaire Score - 02/18/24 1000       Knowledge Questionnaire Score   Pre Score 14/18          Core Components/Risk Factors/Patient Goals at Admission:  Personal Goals and Risk Factors at Admission - 02/18/24 0937       Core Components/Risk Factors/Patient Goals on Admission    Weight Management Yes;Weight Gain    Intervention Weight Management: Develop a combined nutrition and exercise program designed to reach desired caloric intake, while maintaining appropriate intake of nutrient and fiber, sodium and fats, and appropriate energy expenditure required for the weight goal.;Weight Management: Provide education and appropriate  resources to help participant work on and attain dietary goals.;Weight Management/Obesity: Establish reasonable short term and long term weight goals.;Obesity: Provide education and appropriate resources to help participant work on and attain dietary goals.    Admit Weight 163 lb 12.8 oz (74.3 kg)    Expected Outcomes Short Term: Continue to assess and modify interventions until short term weight is achieved;Long Term: Adherence to nutrition and physical activity/exercise program aimed toward attainment of established weight goal;Weight Gain: Understanding of general recommendations for a high calorie, high protein meal plan that promotes weight gain by distributing calorie intake throughout the day with the consumption for 4-5 meals, snacks, and/or supplements;Understanding recommendations for meals to include 15-35% energy as protein, 25-35% energy from fat, 35-60% energy from carbohydrates, less than 200mg  of dietary cholesterol, 20-35 gm of total fiber daily;Understanding of distribution of calorie intake throughout the day with the consumption of 4-5 meals/snacks    Improve shortness of breath with ADL's Yes    Intervention Provide education, individualized exercise plan and daily activity instruction to help decrease symptoms of SOB with  activities of daily living.    Expected Outcomes Short Term: Improve cardiorespiratory fitness to achieve a reduction of symptoms when performing ADLs;Long Term: Be able to perform more ADLs without symptoms or delay the onset of symptoms          Core Components/Risk Factors/Patient Goals Review:   Goals and Risk Factor Review     Row Name 02/21/24 0928 03/22/24 1111 04/19/24 1126 05/15/24 0953 05/22/24 0821     Core Components/Risk Factors/Patient Goals Review   Personal Goals Review Weight Management/Obesity;Improve shortness of breath with ADL's;Develop more efficient breathing techniques such as purse lipped breathing and diaphragmatic breathing and  practicing self-pacing with activity. Weight Management/Obesity;Improve shortness of breath with ADL's;Develop more efficient breathing techniques such as purse lipped breathing and diaphragmatic breathing and practicing self-pacing with activity. Weight Management/Obesity;Improve shortness of breath with ADL's Weight Management/Obesity;Improve shortness of breath with ADL's Weight Management/Obesity;Improve shortness of breath with ADL's   Review Deacon is scheduled to start PR on 02/22/24. Unable to assess his goals at this time. We will continue to monitor his progress throughout the program. Monthly review of patient's Core Components/Risk Factors/Patient Goals are as follows: Goal progressing for improving shortness of breath with ADL's. Victory is currently exercising on RA  to maintain sats >88%. He is currently exercising on the treadmill and the recumbant elliptical. Goal met for developing more efficient breathing techniques such as purse lipped breathing and diaphragmatic breathing; and practicing self-pacing with activity. Allon has attended the breathing techniques education and has been practicing diaphragmatic breathing at home. He is able to demonstrate purse lip breathing when he gets SOB as well as self pace based on the RPE/dyspnea scale. Goal progressing for weight gain . Jazen is working with staff dietitian to achieve his weight gain goals. Monthly review of patient's Core Components/Risk Factors/Patient Goals are as follows: Goal progressing for improving shortness of breath with ADL's. Breckan is currently exercising on RA  to maintain sats >88%. He is currently exercising on the treadmill and the recumbant elliptical. Goal progressing for weight gain. We will continue to monitor his progress throughout the program. Monthly review of patient's Core Components/Risk Factors/Patient Goals are as follows: Goal progressing for improving shortness of breath with ADL's. Canton is currently exercising  on RA  to maintain sats >88%. He is currently exercising on the treadmill and the recumbant elliptical. Goal progressing for weight gain. We will continue to monitor his progress throughout the program. Monthly review of patient's Core Components/Risk Factors/Patient Goals are as follows: Goal progressing for improving shortness of breath with ADL's. Agnes is currently exercising on RA to maintain sats >88%. He is exercising on the treadmill and the recumbent elliptical. He has been able to increase his workload, speed, and incline. He states that he can feel a difference in his ADLs since he's been coming to the program. Goal not met for weight gain. Deontre has lost ~3.3# since starting the program. He has been working with our dietitian on increasing calorie intake. We will continue to monitor his progress throughout the program.   Expected Outcomes To improve shortness of breath with ADL's, develop more efficient breathing techniques such as purse lipped breathing and diaphragmatic breathing; and practicing self-pacing with activity and gain weight. To improve shortness of breath with ADL's  and gain weight. To improve shortness of breath with ADL's  and gain weight. To improve shortness of breath with ADL's  and gain weight. Pt will show progress toward meeting expected goals and outcomes.  Row Name 06/14/24 [phone]             Core Components/Risk Factors/Patient Goals Review   Personal Goals Review Weight Management/Obesity;Improve shortness of breath with ADL's       Review Monthly review of patient's Core Components/Risk Factors/Patient Goals are as follows: Goal progressing for improving shortness of breath with ADL's. Jaimin is currently exercising on RA to maintain sats >88%. He is exercising on the treadmill and the recumbent elliptical. Goal progressing for  weight gain. He continues to work with our dietitician on increasing calorie intake. We will continue to monitor his progress throughout  the program.       Expected Outcomes To improve shortness of breath with ADL's and gain weight          Core Components/Risk Factors/Patient Goals at Discharge (Final Review):   Goals and Risk Factor Review - 06/14/24 0938       Core Components/Risk Factors/Patient Goals Review   Personal Goals Review Weight Management/Obesity;Improve shortness of breath with ADL's    Review Monthly review of patient's Core Components/Risk Factors/Patient Goals are as follows: Goal progressing for improving shortness of breath with ADL's. Jeziel is currently exercising on RA to maintain sats >88%. He is exercising on the treadmill and the recumbent elliptical. Goal progressing for  weight gain. He continues to work with our dietitician on increasing calorie intake. We will continue to monitor his progress throughout the program.    Expected Outcomes To improve shortness of breath with ADL's and gain weight          ITP Comments:Pt is making expected progress toward Pulmonary Rehab goals after completing 32 session(s). Recommend continued exercise, life style modification, education, and utilization of breathing techniques to increase stamina and strength, while also decreasing shortness of breath with exertion.  Dr. Slater Staff is Medical Director for Pulmonary Rehab at [facility].

## 2024-06-22 ENCOUNTER — Encounter (HOSPITAL_COMMUNITY)
Admission: RE | Admit: 2024-06-22 | Discharge: 2024-06-22 | Disposition: A | Discharge status: Home / Self Care | Source: Ambulatory Visit | Attending: Physician Assistant | Admitting: Physician Assistant

## 2024-06-22 DIAGNOSIS — J449 Chronic obstructive pulmonary disease, unspecified: Secondary | ICD-10-CM

## 2024-06-22 NOTE — Progress Notes (Signed)
 Daily Session Note  Patient Details  Name: Ryan Diaz MRN: [id number] Date of Birth: [birth year] Referring Provider:   [name] Pulmonary Rehab Walk Test from 02/18/2024 in [facility] for Heart, Vascular, & Lung Health  Referring Provider [name]    Encounter Date: 06/22/2024  Check In:  Session Check In - 06/22/24 1040       Check-In   Supervising physician immediately available to respond to emergencies [facility] MD immediately available    Physician(s) Orren Fabry, PA    Location [facility]    Staff Present Johnnie Moats, MS, ACSM-CEP, Exercise Physiologist;Mary Harvy, RN, BSN;Chanc Kervin Claudene, RT;Randi Reeve BS, ACSM-CEP, Exercise Physiologist;Samantha Belarus, RD, LDN    Virtual Visit No    Medication changes reported     No    Fall or balance concerns reported    No    Tobacco Cessation No Change    Warm-up and Cool-down Performed as group-led instruction    Resistance Training Performed Yes    VAD Patient? No    PAD/SET Patient? No      Pain Assessment   Currently in Pain? No/denies          Capillary Blood Glucose: No results found for this or any previous visit (from the past 24 hours).    Social History   Tobacco Use  Smoking Status Former   Current packs/day: 0.00   Average packs/day: 1.5 packs/day for 35.0 years (52.5 ttl pk-yrs)   Types: Cigarettes   Start date: 09/04/1974   Quit date: 09/04/2009   Years since quitting: 14.8   Passive exposure: Past  Smokeless Tobacco Never    Goals Met:  Proper associated with RPD/PD & O2 Sat Independence with exercise equipment Exercise tolerated well No report of concerns or symptoms today Strength training completed today  Goals Unmet:  Not Applicable  Comments: Service time is from 1004 to 1140.    Dr. Slater Staff is Medical Director for Pulmonary Rehab at [facility].

## 2024-06-26 ENCOUNTER — Ambulatory Visit: Payer: Self-pay | Admitting: Internal Medicine

## 2024-06-26 ENCOUNTER — Encounter: Payer: Self-pay | Admitting: Internal Medicine

## 2024-06-26 ENCOUNTER — Ambulatory Visit: Admitting: Internal Medicine

## 2024-06-26 VITALS — BP 104/50 | HR 82 | Temp 98.3°F | Ht 70.0 in | Wt 161.0 lb

## 2024-06-26 DIAGNOSIS — E530 Riboflavin deficiency: Secondary | ICD-10-CM

## 2024-06-26 DIAGNOSIS — R7302 Impaired glucose tolerance (oral): Secondary | ICD-10-CM | POA: Diagnosis not present

## 2024-06-26 DIAGNOSIS — E7800 Pure hypercholesterolemia, unspecified: Secondary | ICD-10-CM

## 2024-06-26 DIAGNOSIS — J309 Allergic rhinitis, unspecified: Secondary | ICD-10-CM | POA: Diagnosis not present

## 2024-06-26 DIAGNOSIS — R21 Rash and other nonspecific skin eruption: Secondary | ICD-10-CM | POA: Insufficient documentation

## 2024-06-26 DIAGNOSIS — E559 Vitamin D deficiency, unspecified: Secondary | ICD-10-CM | POA: Diagnosis not present

## 2024-06-26 DIAGNOSIS — I10 Essential (primary) hypertension: Secondary | ICD-10-CM | POA: Diagnosis not present

## 2024-06-26 LAB — BASIC METABOLIC PANEL WITH GFR
BUN: 22 mg/dL (ref 6–23)
CO2: 27 meq/L (ref 19–32)
Calcium: 9.3 mg/dL (ref 8.4–10.5)
Chloride: 103 meq/L (ref 96–112)
Creatinine, Ser: 1.38 mg/dL (ref 0.40–1.50)
GFR: 49.03 mL/min — ABNORMAL LOW (ref >60.00)
Glucose, Bld: 113 mg/dL — ABNORMAL HIGH (ref 70–99)
Potassium: 3.8 meq/L (ref 3.5–5.1)
Sodium: 137 meq/L (ref 135–145)

## 2024-06-26 LAB — LIPID PANEL
Cholesterol: 130 mg/dL (ref 0–200)
HDL: 36.6 mg/dL — ABNORMAL LOW (ref >39.00)
LDL Cholesterol: 62 mg/dL (ref 0–99)
NonHDL: 92.97
Total CHOL/HDL Ratio: 4
Triglycerides: 154 mg/dL — ABNORMAL HIGH (ref 0.0–149.0)
VLDL: 30.8 mg/dL (ref 0.0–40.0)

## 2024-06-26 LAB — HEPATIC FUNCTION PANEL
ALT: 19 U/L (ref 0–53)
AST: 19 U/L (ref 0–37)
Albumin: 4.1 g/dL (ref 3.5–5.2)
Alkaline Phosphatase: 70 U/L (ref 39–117)
Bilirubin, Direct: 0.2 mg/dL (ref 0.0–0.3)
Total Bilirubin: 0.7 mg/dL (ref 0.2–1.2)
Total Protein: 6.7 g/dL (ref 6.0–8.3)

## 2024-06-26 LAB — HEMOGLOBIN A1C: Hgb A1c MFr Bld: 6 % (ref 4.6–6.5)

## 2024-06-26 NOTE — Assessment & Plan Note (Signed)
 Lab Results  Component Value Date   LDLCALC 62 06/26/2024   Stable, pt to continue current statin lipitor 80 mg qd

## 2024-06-26 NOTE — Assessment & Plan Note (Signed)
 Mild to mod, for restart xyzal  5 mg every day prn, atrovent  nasal asd,  to f/u any worsening symptoms or concerns

## 2024-06-26 NOTE — Assessment & Plan Note (Signed)
Lab Results  Component Value Date   VITAMINB12 557 06/18/2023   Stable, cont oral replacement - b12 1000 mcg qd

## 2024-06-26 NOTE — Assessment & Plan Note (Signed)
 Lab Results  Component Value Date   HGBA1C 6.0 06/26/2024   Stable, pt to continue current medical treatment  - diet, wt control

## 2024-06-26 NOTE — Assessment & Plan Note (Signed)
 Last vitamin D Lab Results  Component Value Date   VD25OH 74.44 11/16/2023   Stable, cont oral replacement

## 2024-06-26 NOTE — Progress Notes (Signed)
 The test results show that your current treatment is OK, as the tests are stable.  Please continue the same plan.  There is no other need for change of treatment or further evaluation based on these results, at this time.  thanks

## 2024-06-26 NOTE — Assessment & Plan Note (Signed)
 BP Readings from Last 3 Encounters:  06/26/24 (!) 104/50  02/18/24 111/65  01/10/24 135/78   Low normal, asympt, pt to continue medical treatment losartan  100 gm every day,

## 2024-06-26 NOTE — Assessment & Plan Note (Signed)
 Persistent - for different derm referral

## 2024-06-26 NOTE — Patient Instructions (Signed)

## 2024-06-26 NOTE — Progress Notes (Signed)
 Patient ID: LOYDE ORTH, male   DOB: 1946-10-04, 78 y.o.   MRN: [id number]        Chief Complaint: follow up HTN, HLD and hyperglycemia , low vit d and b12,        HPI:  Ryan Diaz is a 78 y.o. male here overall doing ok.  Does have persistent left groin rash, now happy with recent dermatology appt, asks for referral to different   Pt denies chest pain, increased sob or doe, wheezing, orthopnea, PND, increased LE swelling, palpitations, dizziness or syncope.   Pt denies polydipsia, polyuria, or new focal neuro s/s.   Does have several wks ongoing nasal allergy symptoms with clearish congestion, itch and sneezing, without fever, pain, ST, cough, swelling or wheezing.       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   ALLERGIC RHINITIS 08/08/2007   ANXIETY 08/08/2007   Carpal tunnel syndrome    COPD 10/03/2009   DEPRESSION 08/08/2007   ERECTILE DYSFUNCTION 08/08/2007   GENITAL HERPES, HX OF 08/08/2007   GERD 08/08/2007   GLUCOSE INTOLERANCE 08/08/2007   HYPERLIPIDEMIA 08/08/2007   HYPERTENSION 08/08/2007   Impaired glucose tolerance 09/26/2011   Insomnia 05/18/2012   PAIN IN SOFT TISSUES OF LIMB 10/02/2009   Sleep apnea    OSA-uses CPAP nightly   Past Surgical History:  Procedure Laterality Date   ACHILLES TENDON REPAIR Bilateral    CARPAL TUNNEL RELEASE Right 08/18/2018   Procedure: RIGHT CARPAL TUNNEL RELEASE;  Surgeon: Murrell Drivers, MD;  Location: [facility];  Service: Orthopedics;  Laterality: Right;   CARPAL TUNNEL RELEASE Left 11/21/2018   Procedure: LEFT CARPAL TUNNEL RELEASE;  Surgeon: Murrell Drivers, MD;  Location: [facility];  Service: Orthopedics;  Laterality: Left;  Bier block    COLONOSCOPY  12/2011   Kaplan-MAC-moviprep(exc)-tics/int hems/normal-10 yr recall   LEFT HEART CATH AND CORONARY ANGIOGRAPHY N/A 10/17/2020   Procedure: LEFT HEART CATH AND CORONARY ANGIOGRAPHY;  Surgeon: Mady Bruckner, MD;  Location: [facility];  Service: Cardiovascular;  Laterality: N/A;    reports that he quit smoking about 14 years ago. His smoking use included cigarettes. He started smoking about 49 years ago. He has a 52.5 pack-year smoking history. He has been exposed to tobacco smoke. He has never used smokeless tobacco. He reports current alcohol use. He reports that he does not currently use drugs. Frequency: 1.00 time per week. family history includes Colon polyps (age of onset: 68) in his brother. Allergies  Allergen Reactions   Memantine  Other (See Comments)   Sildenafil  Palpitations   Current Outpatient Medications on File Prior to Visit  Medication Sig Dispense Refill   albuterol  (ACCUNEB ) 0.63 MG/3ML nebulizer solution Take 3 mLs (0.63 mg total) by nebulization every 6 (six) hours as needed for wheezing. 75 mL 12   albuterol  (VENTOLIN  HFA) 108 (90 Base) MCG/ACT inhaler Inhale 2 puffs into the lungs every 6 (six) hours as needed for wheezing or shortness of breath. 18 g 12   aspirin  EC 81 MG tablet Take 81 mg by mouth daily.     atorvastatin  (LIPITOR) 80 MG tablet Take 1 tablet  by mouth once daily 90 tablet 2   cetirizine  (ZYRTEC ) 10 MG tablet Take by mouth.     Cholecalciferol (VITAMIN D ) 50 MCG (2000 UT) tablet Take 1 tablet (2,000 Units total) by mouth daily. 90 tablet 3   Cholecalciferol 50 MCG (2000 UT) TABS Take 1 tablet by mouth daily.     ciclesonide (ALVESCO) 160 MCG/ACT inhaler Inhale into the lungs.     clotrimazole  (LOTRIMIN ) 1 % cream Apply 1 application topically 2 (two) times daily as needed (irritation).     clotrimazole -betamethasone  (LOTRISONE ) cream Apply 1 Application topically daily. Use as directed twice per day as needed 30 g 0   cromolyn  (OPTICROM) 4 % ophthalmic solution Place 1 drop into both eyes 4 (four) times daily as needed.     cyanocobalamin  (VITAMIN B12) 1000 MCG tablet Take 1 tablet (1,000 mcg total) by mouth daily. 90 tablet 3   docusate sodium (COLACE) 100 MG capsule Take 100 mg by mouth daily as needed for mild constipation.     ferrous sulfate 324 MG TBEC Take 324 mg by mouth. As needed     fluticasone  (FLONASE ) 50 MCG/ACT nasal spray Place into the nose.     Fluticasone -Umeclidin-Vilant (TRELEGY ELLIPTA ) 100-62.5-25 MCG/ACT AEPB Inhale 1 puff then rinse mouth, once daily 60 each 12   gabapentin  (NEURONTIN ) 100 MG capsule Take 1 capsule (100 mg total) by mouth 3 (three) times daily. 90 capsule 5   hydrocortisone 2.5 % cream SMARTSIG:Topical 1-2 Times Daily PRN     ipratropium (ATROVENT ) 0.06 % nasal spray Place 2 sprays into both nostrils 3 (three) times daily. 15 mL 5   levocetirizine (XYZAL  ALLERGY 24HR) 5 MG tablet Take 1 tablet (5 mg total) by mouth every evening. 30 tablet 5   losartan  (COZAAR ) 100 MG tablet Take 1 tablet (100 mg total) by mouth daily. 90 tablet 3   meloxicam  (MOBIC ) 15 MG tablet 1 tab by mouth once daily as needed 90 tablet 1   mirtazapine (REMERON) 7.5 MG tablet Take by mouth.     nitroGLYCERIN  (NITROSTAT ) 0.4 MG SL tablet Place 1 tablet (0.4 mg total) under the tongue every 5 (five) minutes as needed for chest pain. 25 tablet 4   Omega-3 Fatty Acids (FISH OIL) 1000 MG CAPS Take 1,000 mg by mouth 2 (two) times daily.     pantoprazole (PROTONIX) 40 MG tablet TAKE ONE TABLET BY MOUTH ONCE EVERY DAY TO CONTROL STOMACH ACID     pantoprazole (PROTONIX) 40 MG tablet Take by mouth.     Spacer/Aero-Holding Chambers (AEROCHAMBER MV) inhaler Use as instructed 1 each 0   tadalafil (CIALIS) 20 MG tablet Take 20 mg by mouth daily as needed for erectile dysfunction.     terazosin  (HYTRIN ) 2 MG capsule Take 1 capsule (2 mg total) by mouth at bedtime. 90 capsule 3   Tiotropium Bromide-Olodaterol 2.5-2.5  MCG/ACT AERS Inhale into the lungs.     traZODone (DESYREL) 50 MG tablet Take 50 mg by mouth at bedtime as needed for sleep.     triamcinolone  (NASACORT ) 55 MCG/ACT AERO nasal inhaler Place 2 sprays into the nose daily. 1 each 12   No current facility-administered medications on file prior to visit.        ROS:  All others reviewed and negative.  Objective        PE:  BP (!) 104/50   Pulse 82   Temp 98.3 F (36.8 C)   Ht 5' 10 (1.778 m)   Wt  161 lb (73 kg)   SpO2 95%   BMI 23.10 kg/m                 Constitutional: Pt appears in NAD               HENT: Head: NCAT.                Right Ear: External ear normal.                 Left Ear: External ear normal.                Eyes: . Pupils are equal, round, and reactive to light. Conjunctivae and EOM are normal               Nose: without d/c or deformity               Neck: Neck supple. Gross normal ROM               Cardiovascular: Normal rate and regular rhythm.                 Pulmonary/Chest: Effort normal and breath sounds without rales or wheezing.                Abd:  Soft, NT, ND, + BS, no organomegaly               Neurological: Pt is alert. At baseline orientation, motor grossly intact               Skin: Skin is warm. No rashes, no other new lesions, LE edema - none               Psychiatric: Pt behavior is normal without agitation   Micro: none  Cardiac tracings I have personally interpreted today:  none  Pertinent Radiological findings (summarize): none   Lab Results  Component Value Date   WBC 6.3 11/16/2023   HGB 14.0 11/16/2023   HCT 41.9 11/16/2023   PLT 285.0 11/16/2023   GLUCOSE 113 (H) 06/26/2024   CHOL 130 06/26/2024   TRIG 154.0 (H) 06/26/2024   HDL 36.60 (L) 06/26/2024   LDLDIRECT 96.0 05/13/2021   LDLCALC 62 06/26/2024   ALT 19 06/26/2024   AST 19 06/26/2024   NA 137 06/26/2024   K 3.8 06/26/2024   CL 103 06/26/2024   CREATININE 1.38 06/26/2024   BUN 22 06/26/2024   CO2 27 06/26/2024    TSH 1.07 11/24/2022   PSA 1.57 01/28/2022   HGBA1C 6.0 06/26/2024   Assessment/Plan:  HAZEN BRUMETT is a 78 y.o. Black or African American [2] male with  has a past medical history of ABUSE, ALCOHOL, CONTINUOUS (08/08/2007), ALLERGIC RHINITIS (08/08/2007), ANXIETY (08/08/2007), Carpal tunnel syndrome, COPD (10/03/2009), DEPRESSION (08/08/2007), ERECTILE DYSFUNCTION (08/08/2007), GENITAL HERPES, HX OF (08/08/2007), GERD (08/08/2007), GLUCOSE INTOLERANCE (08/08/2007), HYPERLIPIDEMIA (08/08/2007), HYPERTENSION (08/08/2007), Impaired glucose tolerance (09/26/2011), Insomnia (05/18/2012), PAIN IN SOFT TISSUES OF LIMB (10/02/2009), and Sleep apnea.  Vitamin D  deficiency Last vitamin D  Lab Results  Component Value Date   VD25OH 74.44 11/16/2023   Stable, cont oral replacement   Vitamin B2 deficiency Lab Results  Component Value Date   VITAMINB12 557 06/18/2023   Stable, cont oral replacement - b12 1000 mcg qd   Impaired glucose tolerance Lab Results  Component Value Date   HGBA1C 6.0 06/26/2024   Stable, pt to continue current medical treatment  - diet, wt control   HLD (hyperlipidemia)  Lab Results  Component Value Date   LDLCALC 62 06/26/2024   Stable, pt to continue current statin lipitor 80 mg qd   Essential hypertension BP Readings from Last 3 Encounters:  06/26/24 (!) 104/50  02/18/24 111/65  01/10/24 135/78   Low normal, asympt, pt to continue medical treatment losartan  100 gm every day,    Allergic rhinitis Mild to mod, for restart xyzal  5 mg every day prn, atrovent  nasal asd,  to f/u any worsening symptoms or concerns   Groin rash Persistent - for different derm referral  Followup: No follow-ups on file.  Lynwood Rush, MD 06/26/2024 1:10 PM [facility] Medicine

## 2024-06-27 ENCOUNTER — Encounter (HOSPITAL_COMMUNITY)
Admission: RE | Admit: 2024-06-27 | Discharge: 2024-06-27 | Disposition: A | Discharge status: Home / Self Care | Source: Ambulatory Visit | Attending: Physician Assistant | Admitting: Physician Assistant

## 2024-06-27 DIAGNOSIS — J449 Chronic obstructive pulmonary disease, unspecified: Secondary | ICD-10-CM

## 2024-06-27 NOTE — Progress Notes (Signed)
 Daily Session Note  Patient Details  Name: Ryan Diaz MRN: [id number] Date of Birth: [birth year] Referring Provider:   [name] Pulmonary Rehab Walk Test from 02/18/2024 in [facility] for Heart, Vascular, & Lung Health  Referring Provider [name]    Encounter Date: 06/27/2024  Check In:  Session Check In - 06/27/24 1205       Check-In   Supervising physician immediately available to respond to emergencies [facility] MD immediately available    Physician(s) Rosabel Mose, NP    Location [facility]    Staff Present Johnnie Moats, MS, ACSM-CEP, Exercise Physiologist;Casey Claudene Evert Byes, RN, MHA;Carlette Bernett, RN, BSN    Virtual Visit No    Medication changes reported     No    Fall or balance concerns reported    No    Tobacco Cessation No Change    Warm-up and Cool-down Performed as group-led instruction    Resistance Training Performed Yes    VAD Patient? No    PAD/SET Patient? No      Pain Assessment   Currently in Pain? No/denies    Multiple Pain Sites No          Capillary Blood Glucose: No results found for this or any previous visit (from the past 24 hours).    Social History   Tobacco Use  Smoking Status Former   Current packs/day: 0.00   Average packs/day: 1.5 packs/day for 35.0 years (52.5 ttl pk-yrs)   Types: Cigarettes   Start date: 09/04/1974   Quit date: 09/04/2009   Years since quitting: 14.8   Passive exposure: Past  Smokeless Tobacco Never    Goals Met:  Proper associated with RPD/PD & O2 Sat Exercise tolerated well No report of concerns or symptoms today Strength training completed today  Goals Unmet:  Not Applicable  Comments: Service time is from 0955 to 1132.    Dr. Slater Staff is Medical Director for Pulmonary Rehab at [facility].

## 2024-06-28 ENCOUNTER — Telehealth (HOSPITAL_COMMUNITY): Payer: Self-pay

## 2024-06-28 NOTE — Telephone Encounter (Signed)
 Called Ryan Diaz to schedule his 6 MWT at 9:30 on 8/14. Patient agreed.

## 2024-06-29 ENCOUNTER — Encounter (HOSPITAL_COMMUNITY)
Admission: RE | Admit: 2024-06-29 | Discharge: 2024-06-29 | Disposition: A | Discharge status: Home / Self Care | Source: Ambulatory Visit | Attending: Physician Assistant | Admitting: Physician Assistant

## 2024-06-29 DIAGNOSIS — J449 Chronic obstructive pulmonary disease, unspecified: Secondary | ICD-10-CM | POA: Diagnosis not present

## 2024-06-29 NOTE — Progress Notes (Signed)
 Daily Session Note  Patient Details  Name: Ryan Diaz MRN: [id number] Date of Birth: 20-Apr-1946 Referring Provider:   [name] Pulmonary Rehab Walk Test from 02/18/2024 in [facility]  Referring Provider [name]    Encounter Date: 06/29/2024  Check In:  Session Check In - 06/29/24 1201       Check-In   Supervising physician immediately available to respond to emergencies [facility] MD immediately available    Physician(s) Damien Braver, NP    Location [facility]    Staff Present Johnnie Moats, MS, ACSM-CEP, Exercise Physiologist;Vedanth Sirico Claudene Maya Koyanagi, RN, Cathyann Levin, RN, BSN    Virtual Visit No    Medication changes reported     No    Fall or balance concerns reported    No    Tobacco Cessation No Change    Warm-up and Cool-down Performed as group-led Writer Performed Yes    VAD Patient? No    PAD/SET Patient? No      Pain Assessment   Currently in Pain? No/denies    Multiple Pain Sites No          Capillary Blood Glucose: No results found for this or any previous visit (from the past 24 hours).    Social History   Tobacco Use  Smoking Status Former   Current packs/day: 0.00   Average packs/day: 1.5 packs/day for 35.0 years (52.5 ttl pk-yrs)   Types: Cigarettes   Start date: 09/04/1974   Quit date: 09/04/2009   Years since quitting: 14.8   Passive exposure: Past  Smokeless Tobacco Never    Goals Met:  Proper associated with RPD/PD & O2 Sat Independence with exercise equipment Exercise tolerated well No report of concerns or symptoms today Strength training completed today  Goals Unmet:  Not Applicable  Comments: Service time is from 0942 to 1134.    Dr. Slater Staff is Medical Director for Pulmonary Rehab at [facility].

## 2024-06-30 NOTE — Progress Notes (Signed)
 Discharge Progress Report  Patient Details  Name: Ryan Diaz MRN: [id number] Date of Birth: 13-May-1946 Referring Provider:   [name] Pulmonary Rehab Walk Test from 02/18/2024 in [facility]  Referring Provider [name]     Number of Visits: 69  Reason for Discharge:  Patient has met program and personal goals.  Smoking History:  Social History   Tobacco Use  Smoking Status Former   Current packs/day: 0.00   Average packs/day: 1.5 packs/day for 35.0 years (52.5 ttl pk-yrs)   Types: Cigarettes   Start date: 09/04/1974   Quit date: 09/04/2009   Years since quitting: 14.8   Passive exposure: Past  Smokeless Tobacco Never    Diagnosis:  Chronic obstructive pulmonary disease, unspecified COPD type (HCC)  ADL UCSD:  Pulmonary Assessment Scores     Row Name 02/18/24 1001 06/27/24 1544       ADL UCSD   ADL Phase Entry Exit    SOB Score total 34 19      CAT Score   CAT Score 14 8      mMRC Score   mMRC Score 1 0       Initial Exercise Prescription:  Initial Exercise Prescription - 02/18/24 1000       Date of Initial Exercise RX and Referring Provider   Date 02/18/24    Referring Provider Levander Staff   Expected Discharge Date 05/16/24      Treadmill   MPH 2.5    Grade 0    Minutes 15    METs 2.91      Recumbant Elliptical   Level 2    RPM 253    Watts 50    Minutes 15    METs 2.5      Prescription Details   Frequency (times per week) 2    Duration Progress to 30 minutes of continuous aerobic without signs/symptoms of physical distress      Intensity   THRR 40-80% of Max Heartrate 57-114    Ratings of Perceived Exertion 11-13    Perceived Dyspnea 0-4      Progression   Progression Continue to progress workloads to maintain intensity without signs/symptoms of physical distress.      Resistance Training   Training Prescription Yes    Weight blue bands    Reps 10-15           Discharge Exercise Prescription (Final Exercise Prescription Changes):  Exercise Prescription Changes - 06/20/24 1200       Response to Exercise   Blood Pressure (Admit) 110/58    Blood Pressure (Exercise) 118/56    Blood Pressure (Exit) 108/62    Heart Rate (Admit) 82 bpm    Heart Rate (Exercise) 119 bpm    Heart Rate (Exit) 89 bpm    Oxygen  Saturation (Admit) 97 %    Oxygen  Saturation (Exercise) 95 %    Oxygen  Saturation (Exit) 96 %    Rating of Perceived Exertion (Exercise) 13    Perceived Dyspnea (Exercise) 2    Duration Continue with 30 min of aerobic exercise without signs/symptoms of physical distress.    Intensity THRR unchanged      Progression   Progression Continue to progress workloads to maintain intensity without signs/symptoms of physical distress.      Resistance Training   Training Prescription Yes    Weight blue bands    Reps 10-15    Time 10  Minutes      Treadmill   MPH 3.2    Grade 3    Minutes 15    METs 4.7      Recumbant Elliptical   Level 6    Minutes 15    METs 4.7          Functional Capacity:  6 Minute Walk     Row Name 02/18/24 1027 06/29/24 1524       6 Minute Walk   Phase Initial Discharge    Distance 1295 feet 1750 feet    Distance % Change -- 35.14 %    Distance Feet Change -- 455 ft    Walk Time 6 minutes 6 minutes    # of Rest Breaks 0 0    MPH 2.45 3.33    METS 2.66 4.07    RPE 11 11    Perceived Dyspnea  1 1    VO2 Peak 9.32 14.23    Symptoms No No    Resting HR 89 bpm 76 bpm    Resting BP 111/65 126/62    Resting Oxygen  Saturation  98 % 96 %    Exercise Oxygen  Saturation  during 6 min walk 95 % 93 %    Max Ex. HR 106 bpm 133 bpm    Max Ex. BP 128/70 162/70    2 Minute Post BP 102/60 140/60      Interval HR   1 Minute HR 97 78    2 Minute HR 105 100    3 Minute HR 101 108    4 Minute HR 101 114    5 Minute HR 106 118    6 Minute HR 106 133    2 Minute Post HR 83 109    Interval Heart Rate?  Yes Yes      Interval Oxygen    Interval Oxygen ? Yes Yes    Baseline Oxygen  Saturation % 98 % 96 %    1 Minute Oxygen  Saturation % 97 % 95 %    1 Minute Liters of Oxygen  0 L 0 L    2 Minute Oxygen  Saturation % 95 % 96 %    2 Minute Liters of Oxygen  0 L 0 L    3 Minute Oxygen  Saturation % 97 % 91 %    3 Minute Liters of Oxygen  0 L 0 L    4 Minute Oxygen  Saturation % 95 % 91 %    4 Minute Liters of Oxygen  0 L 0 L    5 Minute Oxygen  Saturation % 966 % 95 %    5 Minute Liters of Oxygen  0 L 0 L    6 Minute Oxygen  Saturation % 96 % 96 %    6 Minute Liters of Oxygen  0 L 0 L    2 Minute Post Oxygen  Saturation % 98 % 98 %    2 Minute Post Liters of Oxygen  0 L 0 L       Psychological, QOL, Others - Outcomes: PHQ 2/9:    06/27/2024    3:42 PM 06/26/2024   10:03 AM 03/23/2024   12:46 PM 02/18/2024    9:11 AM 12/22/2023    9:55 AM  Depression screen PHQ 2/9  Decreased Interest 1 1 0 0 0  Down, Depressed, Hopeless 0 0 0 0 0  PHQ - 2 Score 1 1 0 0 0  Altered sleeping 1 0 0 1   Tired, decreased energy 1 1 0 1   Change in  appetite 1 1 0 1   Feeling bad or failure about yourself  0 0 0 0   Trouble concentrating 0 1 0 0   Moving slowly or fidgety/restless 0 0 3 0   Suicidal thoughts 0 0 0 0   PHQ-9 Score 4 4 3 3    Difficult doing work/chores Not difficult at all Not difficult at all Not difficult at all Somewhat difficult     Quality of Life:   Personal Goals: Goals established at orientation with interventions provided to work toward goal.  Personal Goals and Risk Factors at Admission - 02/18/24 0937       Core Components/Risk Factors/Patient Goals on Admission    Weight Management Yes;Weight Gain    Intervention Weight Management: Develop a combined nutrition and exercise program designed to reach desired caloric intake, while maintaining appropriate intake of nutrient and fiber, sodium and fats, and appropriate energy expenditure required for the weight goal.;Weight Management: Provide  education and appropriate resources to help participant work on and attain dietary goals.;Weight Management/Obesity: Establish reasonable short term and long term weight goals.;Obesity: Provide education and appropriate resources to help participant work on and attain dietary goals.    Admit Weight 163 lb 12.8 oz (74.3 kg)    Expected Outcomes Short Term: Continue to assess and modify interventions until short term weight is achieved;Long Term: Adherence to nutrition and physical activity/exercise program aimed toward attainment of established weight goal;Weight Gain: Understanding of general recommendations for a high calorie, high protein meal plan that promotes weight gain by distributing calorie intake throughout the day with the consumption for 4-5 meals, snacks, and/or supplements;Understanding recommendations for meals to include 15-35% energy as protein, 25-35% energy from fat, 35-60% energy from carbohydrates, less than 200mg  of dietary cholesterol, 20-35 gm of total fiber daily;Understanding of distribution of calorie intake throughout the day with the consumption of 4-5 meals/snacks    Improve shortness of breath with ADL's Yes    Intervention Provide education, individualized exercise plan and daily activity instruction to help decrease symptoms of SOB with activities of daily living.    Expected Outcomes Short Term: Improve cardiorespiratory fitness to achieve a reduction of symptoms when performing ADLs;Long Term: Be able to perform more ADLs without symptoms or delay the onset of symptoms           Personal Goals Discharge:  Goals and Risk Factor Review     Row Name 02/21/24 0928 03/22/24 1111 04/19/24 1126 05/15/24 0953 05/22/24 0821     Core Components/Risk Factors/Patient Goals Review   Personal Goals Review Weight Management/Obesity;Improve shortness of breath with ADL's;Develop more efficient breathing techniques such as purse lipped breathing and diaphragmatic breathing and  practicing self-pacing with activity. Weight Management/Obesity;Improve shortness of breath with ADL's;Develop more efficient breathing techniques such as purse lipped breathing and diaphragmatic breathing and practicing self-pacing with activity. Weight Management/Obesity;Improve shortness of breath with ADL's Weight Management/Obesity;Improve shortness of breath with ADL's Weight Management/Obesity;Improve shortness of breath with ADL's   Review Ryan Diaz is scheduled to start PR on 02/22/24. Unable to assess his goals at this time. We will continue to monitor his progress throughout the program. Monthly review of patient's Core Components/Risk Factors/Patient Goals are as follows: Goal progressing for improving shortness of breath with ADL's. Ryan Diaz is currently exercising on RA  to maintain sats >88%. He is currently exercising on the treadmill and the recumbant elliptical. Goal met for developing more efficient breathing techniques such as purse lipped breathing and diaphragmatic breathing; and practicing self-pacing  with activity. Ryan Diaz has attended the breathing techniques education and has been practicing diaphragmatic breathing at home. He is able to demonstrate purse lip breathing when he gets SOB as well as self pace based on the RPE/dyspnea scale. Goal progressing for weight gain . Ryan Diaz is working with staff dietitian to achieve his weight gain goals. Monthly review of patient's Core Components/Risk Factors/Patient Goals are as follows: Goal progressing for improving shortness of breath with ADL's. Ryan Diaz is currently exercising on RA  to maintain sats >88%. He is currently exercising on the treadmill and the recumbant elliptical. Goal progressing for weight gain. We will continue to monitor his progress throughout the program. Monthly review of patient's Core Components/Risk Factors/Patient Goals are as follows: Goal progressing for improving shortness of breath with ADL's. Ryan Diaz is currently exercising  on RA  to maintain sats >88%. He is currently exercising on the treadmill and the recumbant elliptical. Goal progressing for weight gain. We will continue to monitor his progress throughout the program. Monthly review of patient's Core Components/Risk Factors/Patient Goals are as follows: Goal progressing for improving shortness of breath with ADL's. Ryan Diaz is currently exercising on RA to maintain sats >88%. He is exercising on the treadmill and the recumbent elliptical. He has been able to increase his workload, speed, and incline. He states that he can feel a difference in his ADLs since he's been coming to the program. Goal not met for weight gain. Ryan Diaz has lost ~3.3# since starting the program. He has been working with our dietitian on increasing calorie intake. We will continue to monitor his progress throughout the program.   Expected Outcomes To improve shortness of breath with ADL's, develop more efficient breathing techniques such as purse lipped breathing and diaphragmatic breathing; and practicing self-pacing with activity and gain weight. To improve shortness of breath with ADL's  and gain weight. To improve shortness of breath with ADL's  and gain weight. To improve shortness of breath with ADL's  and gain weight. Pt will show progress toward meeting expected goals and outcomes.    Row Name 06/14/24 9061 06/30/24 0914           Core Components/Risk Factors/Patient Goals Review   Personal Goals Review Weight Management/Obesity;Improve shortness of breath with ADL's Weight Management/Obesity;Improve shortness of breath with ADL's      Review Monthly review of patient's Core Components/Risk Factors/Patient Goals are as follows: Goal progressing for improving shortness of breath with ADL's. Ryan Diaz is currently exercising on RA to maintain sats >88%. He is exercising on the treadmill and the recumbent elliptical. Goal progressing for  weight gain. He continues to work with our dietitician on  increasing calorie intake. We will continue to monitor his progress throughout the program. Adynn graduated from the PR program on 06/29/24. Ryan Diaz did not meet his goal for weight gain during the program. His initial weight 74.3kg/post 73.4kg. He did meet his goal for improving SOB with ADL's. His initial SOB score 34/post 19. Ryan Diaz did great during the program and was a pleasure to work with. We wish him the best.      Expected Outcomes To improve shortness of breath with ADL's and gain weight To continue to exercise and modify his nutrition and lifestyle post graduation         Exercise Goals and Review:  Exercise Goals     Row Name 02/18/24 0942             Exercise Goals   Increase Physical Activity Yes  Intervention Provide advice, education, support and counseling about physical activity/exercise needs.;Develop an individualized exercise prescription for aerobic and resistive training based on initial evaluation findings, risk stratification, comorbidities and participant's personal goals.       Expected Outcomes Short Term: Attend rehab on a regular basis to increase amount of physical activity.;Long Term: Add in home exercise to make exercise part of routine and to increase amount of physical activity.;Long Term: Exercising regularly at least 3-5 days a week.       Increase Strength and Stamina Yes       Intervention Provide advice, education, support and counseling about physical activity/exercise needs.;Develop an individualized exercise prescription for aerobic and resistive training based on initial evaluation findings, risk stratification, comorbidities and participant's personal goals.       Expected Outcomes Short Term: Increase workloads from initial exercise prescription for resistance, speed, and METs.;Short Term: Perform resistance training exercises routinely during rehab and add in resistance training at home;Long Term: Improve cardiorespiratory fitness, muscular  endurance and strength as measured by increased METs and functional capacity ( )       Able to understand and use rate of perceived exertion (RPE) scale Yes       Intervention Provide education and explanation on how to use RPE scale       Expected Outcomes Short Term: Able to use RPE daily in rehab to express subjective intensity level;Long Term:  Able to use RPE to guide intensity level when exercising independently       Able to understand and use Dyspnea scale Yes       Intervention Provide education and explanation on how to use Dyspnea scale       Expected Outcomes Short Term: Able to use Dyspnea scale daily in rehab to express subjective sense of shortness of breath during exertion;Long Term: Able to use Dyspnea scale to guide intensity level when exercising independently       Knowledge and understanding of Target Heart Rate Range (THRR) Yes       Intervention Provide education and explanation of THRR including how the numbers were predicted and where they are located for reference       Expected Outcomes Short Term: Able to state/look up THRR;Long Term: Able to use THRR to govern intensity when exercising independently;Short Term: Able to use daily as guideline for intensity in rehab       Understanding of Exercise Prescription Yes       Intervention Provide education, explanation, and written materials on patient's individual exercise prescription       Expected Outcomes Short Term: Able to explain program exercise prescription;Long Term: Able to explain home exercise prescription to exercise independently          Exercise Goals Re-Evaluation:  Exercise Goals Re-Evaluation     Row Name 02/23/24 0842 03/24/24 0937 04/21/24 0820 05/17/24 0955 06/14/24 1059     Exercise Goal Re-Evaluation   Exercise Goals Review Increase Physical Activity;Able to understand and use Dyspnea scale;Understanding of Exercise Prescription;Increase Strength and Stamina;Knowledge and understanding of Target  Heart Rate Range (THRR);Able to understand and use rate of perceived exertion (RPE) scale Increase Physical Activity;Able to understand and use Dyspnea scale;Understanding of Exercise Prescription;Increase Strength and Stamina;Knowledge and understanding of Target Heart Rate Range (THRR);Able to understand and use rate of perceived exertion (RPE) scale Increase Physical Activity;Able to understand and use Dyspnea scale;Understanding of Exercise Prescription;Increase Strength and Stamina;Knowledge and understanding of Target Heart Rate Range (THRR);Able to understand and use rate of  perceived exertion (RPE) scale Increase Physical Activity;Able to understand and use Dyspnea scale;Understanding of Exercise Prescription;Increase Strength and Stamina;Knowledge and understanding of Target Heart Rate Range (THRR);Able to understand and use rate of perceived exertion (RPE) scale Increase Physical Activity;Able to understand and use Dyspnea scale;Understanding of Exercise Prescription;Increase Strength and Stamina;Knowledge and understanding of Target Heart Rate Range (THRR);Able to understand and use rate of perceived exertion (RPE) scale   Comments Ryan Diaz has completed 1 exercise session. He averages 2.7 METs at 2.4 mph on the treadmill and 5.4 METs at level 2 on the recumbent elliptical. Ryan Diaz performs the warmup and cooldown standing without limitations. It is too soon to notate any discernable progressions. Will continue to monitor and progress as able. Ryan Diaz has completed 10 exercise sessions. He averages 3.7 METs at 2.7 mph and 2% incline on the treadmill and 5 METs at level 4 on the recumbent elliptical. Ryan Diaz performs the warmup and cooldown standing without limitations. He has increased his speed and incline on the treadmill and level on the Nustep. He tolerates progressions well. Ryan Diaz does need encouragement to increase his levels. We have recently discussed home exercise as I encouraged Ryan Diaz to attend  the [facility] weekly. Will continue to monitor and progress as able. Ryan Diaz has completed 17 exercise sessions. He averages 4 METs at 3 mph and 2% incline on the treadmill and 5.3 METs at level 4 on the recumbent elliptical. Ryan Diaz performs the warmup and cooldown standing without limitations. He has increased his speed on the treadmill as METs have slightly increased. His level on the recumbent elliptical has remained the same, although speed has increased. We have discussed home exercise as I encouraged Ryan Diaz to exercise at the [facility]. Will continue to monitor and progress as able. Ryan Diaz has completed 23exercise sessions. Ryan Diaz exercises for 15 min on the treadmill and recumbent elliptical. He averages 4 METs at 3 mph and 2% incline on the treadmill and 5 METs at level 5 on the recumbent elliptical. Ryan Diaz performs the warmup and cooldown standing without limitations. He has increased level on the recumbent elliptical as METs remain relatively the same. Will continue to monitor and progress as able. Ryan Diaz has completed 30 exercise sessions. Ryan Diaz exercises for 15 min on the treadmill and recumbent elliptical. He averages 4.3 METs at 3 mph and 3% incline on the treadmill and 6.1 METs at level 6 on the recumbent elliptical. Ryan Diaz performs the warmup and cooldown standing without limitations. He continues to increase his level on the recumbent level. METs are also increasing as well. Will continue to monitor and progress as able.   Expected Outcomes Through exercise at rehab and home, the patient will decrease shortness of breath with daily activities and feel confident carrying out an exercise regimn at home. Through exercise at rehab and home, the patient will decrease shortness of breath with daily activities and feel confident carrying out an exercise regimn at home. Through exercise at rehab and home, the patient will decrease shortness of breath with daily activities and feel confident carrying out an exercise  regimn at home. Through exercise at rehab and home, the patient will decrease shortness of breath with daily activities and feel confident carrying out an exercise regimn at home. Through exercise at rehab and home, the patient will decrease shortness of breath with daily activities and feel confident carrying out an exercise regimn at home.      Nutrition & Weight - Outcomes:    Nutrition:  Nutrition Therapy & Goals - 06/15/24  1505       Nutrition Therapy   Diet General Healthy Diet    Drug/Food Interactions Statins/Certain Fruits      Personal Nutrition Goals   Nutrition Goal Patient to identify strategies for weight gain of 0.5-2.0# per week.   goal not met.   Personal Goal #2 --   goal in progress.   Comments Goal not met. Ryan Diaz has medical history of HTN, CAD, OSA, dementia, CKD3, hyperlipidemia, alcohol abuse. He is motivated to gain ~5# to 170#: Patient is down 2.8# since starting with our program; though weight has been stable over the last ~30 days. Eating frequency remains variable and he does skip meals. We have discussed strategies for weight gain including increasing eating frequency, calorie density, etc. He continues to complete group therapy weekly through the [facility]. Ryan Diaz will continue to benefit from participation in pulmonary rehab for nutrition, exercise, and lifestyle modification.      Intervention Plan   Intervention Prescribe, educate and counsel regarding individualized specific dietary modifications aiming towards targeted core components such as weight, hypertension, lipid management, diabetes, heart failure and other comorbidities.;Nutrition handout(s) given to patient.    Expected Outcomes Short Term Goal: Understand basic principles of dietary content, such as calories, fat, sodium, cholesterol and nutrients.;Long Term Goal: Adherence to prescribed nutrition plan.          Nutrition Discharge:   Education Questionnaire Score:  Knowledge Questionnaire  Score - 06/27/24 1544       Knowledge Questionnaire Score   Post Score 16/18          Goals reviewed with patient; copy given to patient.

## 2024-07-04 ENCOUNTER — Ambulatory Visit (HOSPITAL_COMMUNITY)

## 2024-08-24 ENCOUNTER — Ambulatory Visit: Payer: Self-pay

## 2024-08-24 NOTE — Telephone Encounter (Signed)
 FYI Only or Action Required?: FYI only for provider.  Patient was last seen in primary care on 06/26/2024 by Norleen Lynwood ORN, MD.  Called Nurse Triage reporting Groin Swelling.  Symptoms began several months ago.  Interventions attempted: Nothing.  Symptoms are: left groin/scrotal pea sized swelling with mild soreness when touching stable.  Triage Disposition: See PCP When Office is Open (Within 3 Days)  Patient/caregiver understands and will follow disposition?: Yes            Copied from CRM [id number]. Topic: Clinical - Red Word Triage >> Aug 24, 2024 12:23 PM Ryan Diaz ORN wrote: Red Word that prompted transfer to Nurse Triage: Pt believes he has a hernia in his groin. There is a small lump that he noticed 1.5 months ago Reason for Disposition  [1] Lumpy area discovered inside the scrotum AND [2] no pain  Answer Assessment - Initial Assessment Questions Patient declined OV for sooner appointments due to he is requesting a male provider.  1. SCROTAL SWELLING: What does the scrotum look like? How swollen is it? (mild, moderate severe; compare to other side)     Patient states in his left side of his groin/scrotum he can feel a pea sized swelling.  2. LOCATION: Where is the swelling located?     Left groin/scrotum.  3. ONSET: When did the swelling start?     About 2 months, seen on 06/26/24 with PCP.  4. PATTERN: Does it come and go, or has it been constant since it started?     He states it does not hurt day to day but it feels sore. He keeps repeating that he does not know.  5. SCROTAL PAIN: Is there any pain? If Yes, ask: How bad is it?  (Scale 1-10; or mild, moderate, severe)     Describes it as soreness, mild.  6. HERNIA: Has a doctor ever told you that you have a hernia?     No.  7. OTHER SYMPTOMS: Do you have any other symptoms? (e.g., abdomen pain, difficulty passing urine, fever, vomiting)     Denies redness, warmth, fever, difficulty or changes in  passing urine, nausea, vomiting, abdominal pain.  Protocols used: Scrotum Swelling-A-AH

## 2024-08-28 ENCOUNTER — Encounter: Payer: Self-pay | Admitting: Emergency Medicine

## 2024-08-28 ENCOUNTER — Ambulatory Visit (INDEPENDENT_AMBULATORY_CARE_PROVIDER_SITE_OTHER): Admitting: Emergency Medicine

## 2024-08-28 VITALS — BP 152/80 | HR 79 | Temp 98.6°F | Ht 70.0 in | Wt 159.0 lb

## 2024-08-28 DIAGNOSIS — Z23 Encounter for immunization: Secondary | ICD-10-CM

## 2024-08-28 DIAGNOSIS — N5089 Other specified disorders of the male genital organs: Secondary | ICD-10-CM | POA: Diagnosis not present

## 2024-08-28 NOTE — Progress Notes (Signed)
 Ryan Diaz 78 y.o.   Chief Complaint  Patient presents with   Groin Swelling    Patient here for groin swelling Left scrotal area. Patient states its been there for about 2 months. States its not painful but says sometimes he can feel it     HISTORY OF PRESENT ILLNESS: Acute problem visit today. This is a 78 y.o. male complaining of left-sided scrotal mass for the last month or so. No associated symptoms.  Denies pain. No other complaints or medical concerns today.  HPI   Prior to Admission medications   Medication Sig Start Date End Date Taking? Authorizing Provider  albuterol  (ACCUNEB ) 0.63 MG/3ML nebulizer solution Take 3 mLs (0.63 mg total) by nebulization every 6 (six) hours as needed for wheezing. 10/02/21  Yes Ryan Diaz ORN, MD  albuterol  (VENTOLIN  HFA) 108 [phone] Base) MCG/ACT inhaler Inhale 2 puffs into the lungs every 6 (six) hours as needed for wheezing or shortness of breath. 04/08/23  Yes Ryan Diaz, Ryan D, MD  aspirin  EC 81 MG tablet Take 81 mg by mouth daily.   Yes [provider]  atorvastatin  (LIPITOR) 80 MG tablet Take 1 tablet by mouth once daily 04/13/23  Yes Ryan Diaz ORN, MD  cetirizine  (ZYRTEC ) 10 MG tablet Take by mouth. 01/18/24  Yes [provider]  Cholecalciferol (VITAMIN Diaz ) 50 MCG (2000 UT) tablet Take 1 tablet (2,000 Units total) by mouth daily. 08/19/21  Yes Ryan Diaz ORN, MD  Cholecalciferol 50 MCG (2000 UT) TABS Take 1 tablet by mouth daily. 01/18/24  Yes [provider]  ciclesonide (ALVESCO) 160 MCG/ACT inhaler Inhale into the lungs. 01/18/24  Yes [provider]  clotrimazole  (LOTRIMIN ) 1 % cream Apply 1 application topically 2 (two) times daily as needed (irritation).   Yes [provider]  clotrimazole -betamethasone  (LOTRISONE ) cream Apply 1 Application topically daily. Use as directed twice per day as needed 12/08/22  Yes Ryan Diaz ORN, MD  cromolyn (OPTICROM) 4 % ophthalmic solution Place 1 drop into both eyes 4  (four) times daily as needed. 09/07/23  Yes [provider]  cyanocobalamin  (VITAMIN B12) 1000 MCG tablet Take 1 tablet (1,000 mcg total) by mouth daily. 07/31/22  Yes Ryan Diaz ORN, MD  docusate sodium (COLACE) 100 MG capsule Take 100 mg by mouth daily as needed for mild constipation.   Yes [provider]  ferrous sulfate 324 MG TBEC Take 324 mg by mouth. As needed   Yes [provider]  fluticasone  (FLONASE ) 50 MCG/ACT nasal spray Place into the nose. 01/18/24  Yes [provider]  Fluticasone -Umeclidin-Vilant (TRELEGY ELLIPTA ) 100-62.5-25 MCG/ACT AEPB Inhale 1 puff then rinse mouth, once daily 04/08/23  Yes Ryan Diaz, Ryan D, MD  gabapentin  (NEURONTIN ) 100 MG capsule Take 1 capsule (100 mg total) by mouth 3 (three) times daily. 12/08/18  Yes Ryan Diaz ORN, MD  hydrocortisone 2.5 % cream SMARTSIG:Topical 1-2 Times Daily PRN 01/26/23  Yes [provider]  ipratropium (ATROVENT ) 0.06 % nasal spray Place 2 sprays into both nostrils 3 (three) times daily. 09/19/19  Yes Ryan Diaz ORN, MD  levocetirizine (XYZAL  ALLERGY 24HR) 5 MG tablet Take 1 tablet (5 mg total) by mouth every evening. 01/31/24  Yes Ryan Diaz ORN, MD  losartan  (COZAAR ) 100 MG tablet Take 1 tablet (100 mg total) by mouth daily. 07/31/22  Yes Ryan Diaz ORN, MD  meloxicam  (MOBIC ) 15 MG tablet 1 tab by mouth once daily as needed 07/26/20  Yes Ryan Diaz ORN, MD  mirtazapine (REMERON)  7.5 MG tablet Take by mouth. 01/18/24  Yes [provider]  nitroGLYCERIN  (NITROSTAT ) 0.4 MG SL tablet Place 1 tablet (0.4 mg total) under the tongue every 5 (five) minutes as needed for chest pain. 10/09/20  Yes Ryan Oneil BROCKS, MD  Omega-3 Fatty Acids (FISH OIL) 1000 MG CAPS Take 1,000 mg by mouth 2 (two) times daily.   Yes [provider]  pantoprazole (PROTONIX) 40 MG tablet Take by mouth. 01/18/24  Yes [provider]  Spacer/Aero-Holding Chambers (AEROCHAMBER MV) inhaler Use as instructed 07/06/22   Yes Ryan Diaz, Ryan D, MD  tadalafil (CIALIS) 20 MG tablet Take 20 mg by mouth daily as needed for erectile dysfunction.   Yes [provider]  terazosin  (HYTRIN ) 2 MG capsule Take 1 capsule (2 mg total) by mouth at bedtime. 11/07/21  Yes Ryan Diaz ORN, MD  Tiotropium Bromide-Olodaterol 2.5-2.5 MCG/ACT AERS Inhale into the lungs. 01/18/24  Yes [provider]  traZODone (DESYREL) 50 MG tablet Take 50 mg by mouth at bedtime as needed for sleep.   Yes [provider]  triamcinolone  (NASACORT ) 55 MCG/ACT AERO nasal inhaler Place 2 sprays into the nose daily. 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/16/2023   Vitamin B2 deficiency 01/31/2023   CKD (chronic kidney disease) stage 3, GFR 30-59 ml/min (HCC) 01/31/2023   Encounter for well adult exam with abnormal findings 11/24/2022   Balanitis 11/24/2022   Low sodium levels 08/03/2022   Dementia (HCC) 07/31/2022   Urinary frequency 06/23/2022   Headache 02/25/2022   Hidradenitis 10/04/2021   Ptosis of both eyebrows 10/02/2021   Coronary artery disease 06/13/2021   Alcohol abuse 05/13/2021   Alcohol intake above recommended sensible limits 05/13/2021   Dermatochalasis of right upper eyelid 05/13/2021   Disorder of teeth and supporting structures 05/13/2021   Dry eye syndrome of bilateral lacrimal glands 05/13/2021   Ectropion 05/13/2021   Ex-smoker 05/13/2021   Generalized anxiety disorder 05/13/2021   Hypertrophy of prostate without  urinary obstruction and other lower urinary tract symptoms (LUTS) 05/13/2021   Impacted cerumen 05/13/2021   Impotence of organic origin 05/13/2021   Low back pain 05/13/2021   Mechanical ptosis of bilateral eyelids 05/13/2021   Open angle with borderline findings, low risk, bilateral 05/13/2021   Osteoarthritis of wrist 05/13/2021   Other specified disorders of eyelid 05/13/2021   Peyronie's disease 05/13/2021   Senile ectropion of right lower eyelid 05/13/2021   Unspecified ectropion of left lower eyelid 05/13/2021   Preventative health care 05/13/2021   Shortness of breath 10/17/2020   Accelerating angina (HCC) 10/17/2020   Disorder of rotator cuff syndrome of right shoulder and allied disorder 10/04/2020   Wrist pain 07/27/2020   Vitamin Diaz  deficiency 06/01/2020   Weight loss 09/19/2019   Rash 03/27/2019   Right foot pain 12/08/2018   Blurred vision, bilateral 10/19/2018   Bilateral carpal tunnel syndrome 07/11/2018   Tenosynovitis of hand 07/11/2018   Right ankle pain 03/09/2018   Facial rash 10/16/2017   Bruising  02/11/2017   Cough 12/16/2016   Wheezing 12/16/2016   Memory dysfunction 07/15/2016   Dizziness 07/10/2015   Mastoiditis 07/10/2015   Fatigue 07/10/2015   Abnormal TSH 07/10/2015   Pulsatile tinnitus of right ear 01/23/2015   Renal insufficiency 01/09/2015   Bilateral ankle pain 04/10/2014   OSA (obstructive sleep apnea) 09/27/2013   Right carpal tunnel syndrome 07/06/2013   Insomnia 05/18/2012   Nocturia 11/18/2011   Impaired glucose tolerance 09/26/2011   COPD mixed type (HCC) 10/03/2009   HLD (hyperlipidemia) 08/08/2007   Anxiety state 08/08/2007   Erectile dysfunction 08/08/2007   ABUSE, ALCOHOL, CONTINUOUS 08/08/2007   Depression 08/08/2007   Essential hypertension 08/08/2007   Allergic rhinitis 08/08/2007   GERD 08/08/2007   GENITAL HERPES, HX OF 08/08/2007    Past Medical History:  Diagnosis Date   ABUSE, ALCOHOL, CONTINUOUS 08/08/2007    ALLERGIC RHINITIS 08/08/2007   ANXIETY 08/08/2007   Carpal tunnel syndrome    COPD 10/03/2009   DEPRESSION 08/08/2007   ERECTILE DYSFUNCTION 08/08/2007   GENITAL HERPES, HX OF 08/08/2007   GERD 08/08/2007   GLUCOSE INTOLERANCE 08/08/2007   HYPERLIPIDEMIA 08/08/2007   HYPERTENSION 08/08/2007   Impaired glucose tolerance 09/26/2011   Insomnia 05/18/2012   PAIN IN SOFT TISSUES OF LIMB 10/02/2009   Sleep apnea    OSA-uses CPAP nightly    Past Surgical History:  Procedure Laterality Date   ACHILLES TENDON REPAIR Bilateral    CARPAL TUNNEL RELEASE Right 08/18/2018   Procedure: RIGHT CARPAL TUNNEL RELEASE;  Surgeon: Murrell Drivers, MD;  Location: [facility];  Service: Orthopedics;  Laterality: Right;   CARPAL TUNNEL RELEASE Left 11/21/2018   Procedure: LEFT CARPAL TUNNEL RELEASE;  Surgeon: Murrell Drivers, MD;  Location: [facility];  Service: Orthopedics;  Laterality: Left;  Bier block   COLONOSCOPY  12/2011   Kaplan-MAC-moviprep(exc)-tics/int hems/normal-10 yr recall   LEFT HEART CATH AND CORONARY ANGIOGRAPHY N/A 10/17/2020   Procedure: LEFT HEART CATH AND CORONARY ANGIOGRAPHY;  Surgeon: Mady Bruckner, MD;  Location: [facility];  Service: Cardiovascular;  Laterality: N/A;    Social History   Socioeconomic History   Marital status: Single    Spouse name: Not on file   Number of children: 2   Years of education: Not on file   Highest education level: Bachelor's degree (e.g., BA, AB, BS)  Occupational History   Occupation: self employed Airline pilot work  Tobacco Use   Smoking status: Former    Current packs/day: 0.00    Average packs/day: 1.5 packs/day for 35.0 years (52.5 ttl pk-yrs)    Types: Cigarettes    Start date: 09/04/1974    Quit date: 09/04/2009    Years since quitting: 14.9    Passive exposure: Past   Smokeless tobacco: Never  Vaping Use   Vaping status: Never Used  Substance and Sexual Activity   Alcohol use: Yes    Comment:  occasionally   Drug use: Not Currently    Frequency: 1.0 times per week   Sexual activity: Yes  Other Topics Concern   Not on file  Social History Narrative   Lives alone   Social Drivers of Health   Financial Resource Strain: Low Risk  (03/23/2024)   Overall Financial Resource Strain (CARDIA)    Difficulty of Paying Living Expenses: Not hard at all  Food Insecurity: No Food Insecurity (03/23/2024)   Hunger Vital Sign    Worried About Running Out of Food in the Last Year: Never true  Ran Out of Food in the Last Year: Never true  Transportation Needs: No Transportation Needs (03/23/2024)   PRAPARE - Administrator, Civil Service (Medical): No    Lack of Transportation (Non-Medical): No  Physical Activity: Insufficiently Active (03/23/2024)   Exercise Vital Sign    Days of Exercise per Week: 2 days    Minutes of Exercise per Session: 10 min  Stress: No Stress Concern Present (03/23/2024)   [employer] of Occupational Health - Occupational Stress Questionnaire    Feeling of Stress : Not at all  Social Connections: Socially Integrated (03/23/2024)   Social Connection and Isolation Panel    Frequency of Communication with Friends and Family: More than three times a week    Frequency of Social Gatherings with Friends and Family: Twice a week    Attends Religious Services: 1 to 4 times per year    Active Member of [employer] or Organizations: Yes    Attends Banker Meetings: 1 to 4 times per year    Marital Status: Living with partner  Intimate Partner Violence: Not At Risk (03/23/2024)   Humiliation, Afraid, Rape, and Kick questionnaire    Fear of Current or Ex-Partner: No    Emotionally Abused: No    Physically Abused: No    Sexually Abused: No    Family History  Problem Relation Age of Onset   Colon polyps Brother 60   Colon cancer Neg Hx    Esophageal cancer Neg Hx    Rectal cancer Neg Hx    Stomach cancer Neg Hx      Review of Systems   Constitutional: Negative.  Negative for chills and fever.  HENT: Negative.  Negative for congestion and sore throat.   Respiratory: Negative.  Negative for cough and shortness of breath.   Cardiovascular: Negative.  Negative for chest pain and palpitations.  Gastrointestinal:  Negative for abdominal pain, diarrhea, nausea and vomiting.  Genitourinary: Negative.  Negative for dysuria and hematuria.  Skin: Negative.  Negative for rash.  Neurological: Negative.  Negative for dizziness and headaches.  All other systems reviewed and are negative.   Vitals:   08/28/24 1010  BP: (!) 152/80  Pulse: 79  Temp: 98.6 F (37 C)  SpO2: 97%    Physical Exam Vitals reviewed.  Constitutional:      Appearance: Normal appearance.  HENT:     Head: Normocephalic.  Eyes:     Extraocular Movements: Extraocular movements intact.  Cardiovascular:     Rate and Rhythm: Normal rate.  Pulmonary:     Effort: Pulmonary effort is normal.  Abdominal:     Hernia: There is no hernia in the left inguinal area or right inguinal area.  Genitourinary:    Penis: Circumcised.      Testes:        Left: Mass present. Tenderness not present.     Epididymis:     Right: Normal.     Left: Normal.  Lymphadenopathy:     Lower Body: No right inguinal adenopathy. No left inguinal adenopathy.  Skin:    General: Skin is warm and dry.  Neurological:     Mental Status: He is alert and oriented to person, place, and time.  Psychiatric:        Mood and Affect: Mood normal.        Behavior: Behavior normal.      ASSESSMENT & PLAN: Problem List Items Addressed This Visit       Other  Scrotal mass - Primary   Clinically stable.  No red flag signs or symptoms Small nontender left-sided scrotal mass felt on physical exam Differential diagnosis discussed with patient. Recommend scrotal ultrasound for further definition We will follow-up after that May need follow-up with urologist. Also recommend follow-up  with PCP.      Relevant Orders   US  Scrotum   Other Visit Diagnoses       Need for vaccination       Relevant Orders   Flu vaccine HIGH DOSE PF(Fluzone Trivalent) (Completed)      Patient Instructions  Health Maintenance After Age 62 After age 45, you are at a higher risk for certain long-term diseases and infections as well as injuries from falls. Falls are a major cause of broken bones and head injuries in people who are older than age 16. Getting regular preventive care can help to keep you healthy and well. Preventive care includes getting regular testing and making lifestyle changes as recommended by your health care provider. Talk with your health care provider about: Which screenings and tests you should have. A screening is a test that checks for a disease when you have no symptoms. A diet and exercise plan that is right for you. What should I know about screenings and tests to prevent falls? Screening and testing are the best ways to find a health problem early. Early diagnosis and treatment give you the best chance of managing medical conditions that are common after age 41. Certain conditions and lifestyle choices may make you more likely to have a fall. Your health care provider may recommend: Regular vision checks. Poor vision and conditions such as cataracts can make you more likely to have a fall. If you wear glasses, make sure to get your prescription updated if your vision changes. Medicine review. Work with your health care provider to regularly review all of the medicines you are taking, including over-the-counter medicines. Ask your health care provider about any side effects that may make you more likely to have a fall. Tell your health care provider if any medicines that you take make you feel dizzy or sleepy. Strength and balance checks. Your health care provider may recommend certain tests to check your strength and balance while standing, walking, or changing  positions. Foot health exam. Foot pain and numbness, as well as not wearing proper footwear, can make you more likely to have a fall. Screenings, including: Osteoporosis screening. Osteoporosis is a condition that causes the bones to get weaker and break more easily. Blood pressure screening. Blood pressure changes and medicines to control blood pressure can make you feel dizzy. Depression screening. You may be more likely to have a fall if you have a fear of falling, feel depressed, or feel unable to do activities that you used to do. Alcohol use screening. Using too much alcohol can affect your balance and may make you more likely to have a fall. Follow these instructions at home: Lifestyle Do not drink alcohol if: Your health care provider tells you not to drink. If you drink alcohol: Limit how much you have to: 0-1 drink a day for women. 0-2 drinks a day for men. Know how much alcohol is in your drink. In the [place] one drink equals one 12 oz bottle of beer (355 mL), one 5 oz glass of wine (148 mL), or one 1 oz glass of hard liquor (44 mL). Do not use any products that contain nicotine or tobacco. These products include cigarettes,  chewing tobacco, and vaping devices, such as e-cigarettes. If you need help quitting, ask your health care provider. Activity  Follow a regular exercise program to stay fit. This will help you maintain your balance. Ask your health care provider what types of exercise are appropriate for you. If you need a cane or walker, use it as recommended by your health care provider. Wear supportive shoes that have nonskid soles. Safety  Remove any tripping hazards, such as rugs, cords, and clutter. Install safety equipment such as grab bars in bathrooms and safety rails on stairs. Keep rooms and walkways well-lit. General instructions Talk with your health care provider about your risks for falling. Tell your health care provider if: You fall. Be sure to tell your  health care provider about all falls, even ones that seem minor. You feel dizzy, tiredness (fatigue), or off-balance. Take over-the-counter and prescription medicines only as told by your health care provider. These include supplements. Eat a healthy diet and maintain a healthy weight. A healthy diet includes low-fat dairy products, low-fat (lean) meats, and fiber from whole grains, beans, and lots of fruits and vegetables. Stay current with your vaccines. Schedule regular health, dental, and eye exams. Summary Having a healthy lifestyle and getting preventive care can help to protect your health and wellness after age 75. Screening and testing are the best way to find a health problem early and help you avoid having a fall. Early diagnosis and treatment give you the best chance for managing medical conditions that are more common for people who are older than age 72. Falls are a major cause of broken bones and head injuries in people who are older than age 77. Take precautions to prevent a fall at home. Work with your health care provider to learn what changes you can make to improve your health and wellness and to prevent falls. This information is not intended to replace advice given to you by your health care provider. Make sure you discuss any questions you have with your health care provider. Document Revised: 03/24/2021 Document Reviewed: 03/24/2021 Elsevier Patient Education  2024 Elsevier Inc.     Emil Schaumann, MD [facility]

## 2024-08-28 NOTE — Assessment & Plan Note (Addendum)
 Clinically stable.  No red flag signs or symptoms Small nontender left-sided scrotal mass felt on physical exam Differential diagnosis discussed with patient. Recommend scrotal ultrasound for further definition We will follow-up after that May need follow-up with urologist. Also recommend follow-up with PCP.

## 2024-08-28 NOTE — Patient Instructions (Signed)
 Health Maintenance After Age 78 After age 27, you are at a higher risk for certain long-term diseases and infections as well as injuries from falls. Falls are a major cause of broken bones and head injuries in people who are older than age 73. Getting regular preventive care can help to keep you healthy and well. Preventive care includes getting regular testing and making lifestyle changes as recommended by your health care provider. Talk with your health care provider about: Which screenings and tests you should have. A screening is a test that checks for a disease when you have no symptoms. A diet and exercise plan that is right for you. What should I know about screenings and tests to prevent falls? Screening and testing are the best ways to find a health problem early. Early diagnosis and treatment give you the best chance of managing medical conditions that are common after age 90. Certain conditions and lifestyle choices may make you more likely to have a fall. Your health care provider may recommend: Regular vision checks. Poor vision and conditions such as cataracts can make you more likely to have a fall. If you wear glasses, make sure to get your prescription updated if your vision changes. Medicine review. Work with your health care provider to regularly review all of the medicines you are taking, including over-the-counter medicines. Ask your health care provider about any side effects that may make you more likely to have a fall. Tell your health care provider if any medicines that you take make you feel dizzy or sleepy. Strength and balance checks. Your health care provider may recommend certain tests to check your strength and balance while standing, walking, or changing positions. Foot health exam. Foot pain and numbness, as well as not wearing proper footwear, can make you more likely to have a fall. Screenings, including: Osteoporosis screening. Osteoporosis is a condition that causes  the bones to get weaker and break more easily. Blood pressure screening. Blood pressure changes and medicines to control blood pressure can make you feel dizzy. Depression screening. You may be more likely to have a fall if you have a fear of falling, feel depressed, or feel unable to do activities that you used to do. Alcohol  use screening. Using too much alcohol  can affect your balance and may make you more likely to have a fall. Follow these instructions at home: Lifestyle Do not drink alcohol  if: Your health care provider tells you not to drink. If you drink alcohol : Limit how much you have to: 0-1 drink a day for women. 0-2 drinks a day for men. Know how much alcohol  is in your drink. In the [place] one drink equals one 12 oz bottle of beer (355 mL), one 5 oz glass of wine (148 mL), or one 1 oz glass of hard liquor (44 mL). Do not use any products that contain nicotine or tobacco. These products include cigarettes, chewing tobacco, and vaping devices, such as e-cigarettes. If you need help quitting, ask your health care provider. Activity  Follow a regular exercise program to stay fit. This will help you maintain your balance. Ask your health care provider what types of exercise are appropriate for you. If you need a cane or walker, use it as recommended by your health care provider. Wear supportive shoes that have nonskid soles. Safety  Remove any tripping hazards, such as rugs, cords, and clutter. Install safety equipment such as grab bars in bathrooms and safety rails on stairs. Keep rooms and walkways  well-lit. General instructions Talk with your health care provider about your risks for falling. Tell your health care provider if: You fall. Be sure to tell your health care provider about all falls, even ones that seem minor. You feel dizzy, tiredness (fatigue), or off-balance. Take over-the-counter and prescription medicines only as told by your health care provider. These include  supplements. Eat a healthy diet and maintain a healthy weight. A healthy diet includes low-fat dairy products, low-fat (lean) meats, and fiber from whole grains, beans, and lots of fruits and vegetables. Stay current with your vaccines. Schedule regular health, dental, and eye exams. Summary Having a healthy lifestyle and getting preventive care can help to protect your health and wellness after age 15. Screening and testing are the best way to find a health problem early and help you avoid having a fall. Early diagnosis and treatment give you the best chance for managing medical conditions that are more common for people who are older than age 42. Falls are a major cause of broken bones and head injuries in people who are older than age 64. Take precautions to prevent a fall at home. Work with your health care provider to learn what changes you can make to improve your health and wellness and to prevent falls. This information is not intended to replace advice given to you by your health care provider. Make sure you discuss any questions you have with your health care provider. Document Revised: 03/24/2021 Document Reviewed: 03/24/2021 Elsevier Patient Education  2024 [employer].

## 2024-09-12 ENCOUNTER — Telehealth: Payer: Self-pay

## 2024-09-12 NOTE — Telephone Encounter (Unsigned)
 Copied from CRM #[id number]. Topic: General - Other >> Sep 12, 2024 12:11 PM Ryan Diaz wrote: Reason for CRM: scrotal mass Pt called in he says he had a visit on 10/13,dr norleen had a sub in dr , he says the dr  was suppose to get a scrotal mass, pt have not gotten any updated on getting scheduled for this apt. Please call pt back at earliest .

## 2024-09-12 NOTE — Telephone Encounter (Signed)
 Left voicemail to call office back to go over results.

## 2024-09-12 NOTE — Telephone Encounter (Signed)
 Ok I see where the ultrasound Scrotum was ordered per Dr Purcell on Oct 13, and sometimes this particular test takes some time to be scheduled.   Please let pt know, we should give more time, but let us  know next week if he has not been contacted.   Thanks

## 2024-09-18 ENCOUNTER — Encounter: Payer: Self-pay | Admitting: Radiology

## 2024-11-03 ENCOUNTER — Encounter: Payer: Self-pay | Admitting: Internal Medicine

## 2025-03-27 ENCOUNTER — Ambulatory Visit
